# Patient Record
Sex: Male | Born: 1935 | Race: White | Hispanic: No | Marital: Married | Smoking: Former smoker
Health system: Southern US, Community
[De-identification: ages and names within clinical notes are randomized; demographics above are authoritative.]

## PROBLEM LIST (undated history)

## (undated) ENCOUNTER — Inpatient Hospital Stay (HOSPITAL_COMMUNITY): Admission: AD | Payer: Medicare Other | Source: Ambulatory Visit | Admitting: Internal Medicine

## (undated) DIAGNOSIS — I639 Cerebral infarction, unspecified: Secondary | ICD-10-CM

## (undated) DIAGNOSIS — I447 Left bundle-branch block, unspecified: Secondary | ICD-10-CM

## (undated) DIAGNOSIS — I509 Heart failure, unspecified: Secondary | ICD-10-CM

## (undated) DIAGNOSIS — M549 Dorsalgia, unspecified: Secondary | ICD-10-CM

## (undated) DIAGNOSIS — I6529 Occlusion and stenosis of unspecified carotid artery: Secondary | ICD-10-CM

## (undated) DIAGNOSIS — I2510 Atherosclerotic heart disease of native coronary artery without angina pectoris: Secondary | ICD-10-CM

## (undated) DIAGNOSIS — M353 Polymyalgia rheumatica: Secondary | ICD-10-CM

## (undated) DIAGNOSIS — N183 Chronic kidney disease, stage 3 unspecified: Secondary | ICD-10-CM

## (undated) DIAGNOSIS — E039 Hypothyroidism, unspecified: Secondary | ICD-10-CM

## (undated) DIAGNOSIS — E7800 Pure hypercholesterolemia, unspecified: Secondary | ICD-10-CM

## (undated) DIAGNOSIS — Z905 Acquired absence of kidney: Secondary | ICD-10-CM

## (undated) DIAGNOSIS — Z951 Presence of aortocoronary bypass graft: Secondary | ICD-10-CM

## (undated) DIAGNOSIS — I219 Acute myocardial infarction, unspecified: Secondary | ICD-10-CM

## (undated) DIAGNOSIS — G8929 Other chronic pain: Secondary | ICD-10-CM

## (undated) HISTORY — PX: KIDNEY SURGERY: SHX687

## (undated) HISTORY — DX: Cerebral infarction, unspecified: I63.9

## (undated) HISTORY — DX: Acute myocardial infarction, unspecified: I21.9

## (undated) HISTORY — DX: Heart failure, unspecified: I50.9

## (undated) HISTORY — PX: CHOLECYSTECTOMY: SHX55

## (undated) HISTORY — PX: NEPHRECTOMY: SHX65

## (undated) HISTORY — PX: CORONARY ARTERY BYPASS GRAFT: SHX141

---

## 1999-06-22 ENCOUNTER — Ambulatory Visit: Admission: RE | Admit: 1999-06-22 | Discharge: 1999-06-22 | Payer: Self-pay | Admitting: Internal Medicine

## 2000-02-06 ENCOUNTER — Encounter: Payer: Self-pay | Admitting: Urology

## 2000-02-10 ENCOUNTER — Encounter (INDEPENDENT_AMBULATORY_CARE_PROVIDER_SITE_OTHER): Payer: Self-pay

## 2000-02-10 ENCOUNTER — Encounter: Payer: Self-pay | Admitting: Urology

## 2000-02-10 ENCOUNTER — Inpatient Hospital Stay (HOSPITAL_COMMUNITY): Admission: RE | Admit: 2000-02-10 | Discharge: 2000-02-14 | Payer: Self-pay | Admitting: Urology

## 2001-04-12 ENCOUNTER — Encounter: Payer: Self-pay | Admitting: Urology

## 2001-04-12 ENCOUNTER — Encounter: Admission: RE | Admit: 2001-04-12 | Discharge: 2001-04-12 | Payer: Self-pay | Admitting: Urology

## 2001-07-13 ENCOUNTER — Encounter: Payer: Self-pay | Admitting: Internal Medicine

## 2001-07-13 ENCOUNTER — Encounter: Admission: RE | Admit: 2001-07-13 | Discharge: 2001-07-13 | Payer: Self-pay | Admitting: Internal Medicine

## 2002-02-04 ENCOUNTER — Encounter: Admission: RE | Admit: 2002-02-04 | Discharge: 2002-02-04 | Payer: Self-pay | Admitting: Urology

## 2002-02-04 ENCOUNTER — Encounter: Payer: Self-pay | Admitting: Urology

## 2002-02-14 ENCOUNTER — Encounter: Payer: Self-pay | Admitting: Urology

## 2002-02-14 ENCOUNTER — Ambulatory Visit (HOSPITAL_COMMUNITY): Admission: RE | Admit: 2002-02-14 | Discharge: 2002-02-14 | Payer: Self-pay | Admitting: Urology

## 2007-11-09 ENCOUNTER — Ambulatory Visit: Payer: Self-pay | Admitting: Vascular Surgery

## 2008-01-17 ENCOUNTER — Encounter: Admission: RE | Admit: 2008-01-17 | Discharge: 2008-01-17 | Payer: Self-pay | Admitting: Internal Medicine

## 2008-02-19 ENCOUNTER — Emergency Department (HOSPITAL_COMMUNITY): Admission: EM | Admit: 2008-02-19 | Discharge: 2008-02-19 | Payer: Self-pay | Admitting: Emergency Medicine

## 2008-10-16 ENCOUNTER — Ambulatory Visit: Payer: Self-pay | Admitting: Vascular Surgery

## 2009-05-15 ENCOUNTER — Ambulatory Visit: Payer: Self-pay | Admitting: Vascular Surgery

## 2009-06-24 ENCOUNTER — Inpatient Hospital Stay (HOSPITAL_COMMUNITY): Admission: EM | Admit: 2009-06-24 | Discharge: 2009-06-25 | Payer: Self-pay | Admitting: Emergency Medicine

## 2009-12-25 ENCOUNTER — Ambulatory Visit: Payer: Self-pay | Admitting: Vascular Surgery

## 2010-09-10 ENCOUNTER — Ambulatory Visit: Payer: Self-pay | Admitting: Vascular Surgery

## 2010-12-19 ENCOUNTER — Other Ambulatory Visit (INDEPENDENT_AMBULATORY_CARE_PROVIDER_SITE_OTHER): Payer: Medicare Other

## 2010-12-19 DIAGNOSIS — I6529 Occlusion and stenosis of unspecified carotid artery: Secondary | ICD-10-CM

## 2010-12-23 NOTE — Procedures (Unsigned)
CAROTID DUPLEX EXAM  INDICATION:  Follow up carotid artery disease.  HISTORY: Diabetes:  No. Cardiac:  CABG. Hypertension:  No. Smoking:  No. Previous Surgery:  No. CV History:  Currently asymptomatic. Amaurosis Fugax No, Paresthesias No, Hemiparesis No.                                      RIGHT             LEFT Brachial systolic pressure:         136               134 Brachial Doppler waveforms:         WNL               WNL Vertebral direction of flow:        Antegrade         Antegrade DUPLEX VELOCITIES (cm/sec) CCA peak systolic                   86                83 ECA peak systolic                   107               110 ICA peak systolic                   67                200 ICA end diastolic                   12                36 PLAQUE MORPHOLOGY:                  Heterogenous      Heterogenous PLAQUE AMOUNT:                      Mild              Moderate PLAQUE LOCATION:                    ICA, CCA          ICA, ECA, CCA  IMPRESSION: 1. Right side, no hemodynamically significant stenosis. 2. The left side has a 60% to 79% stenosis in the internal carotids,     the low end of range. 3. Study is stable compared to previous.  ___________________________________________ Quita Skye Hart [place], M.D.  OD/MEDQ  D:  12/19/2010  T:  12/19/2010  Job:  [id number]

## 2011-02-15 LAB — CBC
HCT: 39.2 % (ref 39.0–52.0)
MCHC: 33.9 g/dL (ref 30.0–36.0)
MCV: 100.2 fL — ABNORMAL HIGH (ref 78.0–100.0)
Platelets: 130 10*3/uL — ABNORMAL LOW (ref 150–400)
RDW: 13.3 % (ref 11.5–15.5)

## 2011-02-15 LAB — CARDIAC PANEL(CRET KIN+CKTOT+MB+TROPI)
CK, MB: 2.4 ng/mL (ref 0.3–4.0)
Relative Index: INVALID (ref 0.0–2.5)
Troponin I: 0.02 ng/mL (ref 0.00–0.06)
Troponin I: 0.03 ng/mL (ref 0.00–0.06)

## 2011-02-15 LAB — DIFFERENTIAL
Basophils Absolute: 0.1 10*3/uL (ref 0.0–0.1)
Eosinophils Absolute: 0.2 10*3/uL (ref 0.0–0.7)
Lymphs Abs: 1.1 10*3/uL (ref 0.7–4.0)
Monocytes Absolute: 0.6 10*3/uL (ref 0.1–1.0)

## 2011-02-15 LAB — POCT CARDIAC MARKERS: Troponin i, poc: <0.05 ng/mL (ref 0.00–0.09)

## 2011-02-15 LAB — APTT: aPTT: 24 seconds (ref 24–37)

## 2011-02-15 LAB — POCT I-STAT, CHEM 8
BUN: 35 mg/dL — ABNORMAL HIGH (ref 6–23)
Calcium, Ion: 1.13 mmol/L (ref 1.12–1.32)
TCO2: 26 mmol/L (ref 0–100)

## 2011-02-15 LAB — TROPONIN I: Troponin I: 0.03 ng/mL (ref 0.00–0.06)

## 2011-02-15 LAB — D-DIMER, QUANTITATIVE: D-Dimer, Quant: 0.48 ug/mL-FEU (ref 0.00–0.48)

## 2011-03-25 NOTE — Procedures (Signed)
CAROTID DUPLEX EXAM   INDICATION:  Carotid disease.   HISTORY:  Diabetes:  No.  Cardiac:  CABG.  Hypertension:  No.  Smoking:  No.  Previous Surgery:  No.  CV History:  Currently asymptomatic.  Amaurosis Fugax No, Paresthesias No, Hemiparesis No                                       RIGHT             LEFT  Brachial systolic pressure:         138               138  Brachial Doppler waveforms:         Normal            Normal  Vertebral direction of flow:        Antegrade         Antegrade  DUPLEX VELOCITIES (cm/sec)  CCA peak systolic                   85                68  ECA peak systolic                   89                140  ICA peak systolic                   71                215  ICA end diastolic                   15                52  PLAQUE MORPHOLOGY:                  Mixed             Mixed  PLAQUE AMOUNT:                      Mild              Moderate  PLAQUE LOCATION:                    ICA / CCA         ICA / ECA / CCA   IMPRESSION:  1. 1%-39% stenosis of the right internal carotid artery.  2. Low end 60%-79% stenosis of the left internal carotid artery.  3. No significant change noted when compared to the previous exam on      05/15/2009.   ___________________________________________  Quita Skye. Hart [place], M.D.   CH/MEDQ  D:  12/25/2009  T:  12/26/2009  Job:  [id number]

## 2011-03-25 NOTE — Procedures (Signed)
CAROTID DUPLEX EXAM   INDICATION:  Followup known carotid artery disease.   HISTORY:  Diabetes:  No.  Cardiac:  Yes, CABG.  Hypertension:  No.  Smoking:  No.  Previous Surgery:  Left kidney nephrectomy.  CV History:  Amaurosis Fugax No, Paresthesias No, Hemiparesis No                                       RIGHT             LEFT  Brachial systolic pressure:         130               120  Brachial Doppler waveforms:         Biphasic          Biphasic  Vertebral direction of flow:        Antegrade         Antegrade  DUPLEX VELOCITIES (cm/sec)  CCA peak systolic                   77                83  ECA peak systolic                   101               99  ICA peak systolic                   67                176  ICA end diastolic                   12                33  PLAQUE MORPHOLOGY:                  Heterogenous      Heterogeneous  PLAQUE AMOUNT:                      Mild              Moderate  PLAQUE LOCATION:                    ICA               ICA, ECA   IMPRESSION:  1. 40-59% stenosis noted in the left internal carotid artery.  2. 1-39% stenosis noted in the right internal carotid artery.  3. Antegrade bilateral vertebral arteries.   ___________________________________________  Quita Skye Hart [place], M.D.   MG/MEDQ  D:  11/09/2007  T:  11/09/2007  Job:  [id number]

## 2011-03-25 NOTE — H&P (Signed)
NAME:  Barry Wright, Barry Wright NO.:  [id number]   MEDICAL RECORD NO.:  [id number]          PATIENT TYPE:  EMS   LOCATION:  MAJO                         FACILITY:  [facility]   PHYSICIAN:  Jake Bathe, MD      DATE OF BIRTH:  1920-07-12   DATE OF ADMISSION:  06/24/2009  DATE OF DISCHARGE:                              HISTORY & PHYSICAL   PRIMARY CARE PHYSICIAN:  Theressa Millard, MD   CARDIOLOGIST:  Lyn Records, MD   CHIEF COMPLAINT:  Shoulder pain/chest pain.   HISTORY OF PRESENT ILLNESS:  An 77 year old male whose birthday is today  with a history of CAD status post bypass surgery in 1996 who earlier  today at church had two separate episodes of pain behind his left  scapula with associated diaphoresis.  These two episodes lasted  approximately 5 minutes in duration and subsided.  Because of the  diaphoresis and his prior cardiac history, he was concerned and called  911 who then took him to the [facility] Emergency Department for further  evaluation.  He clearly denies currently any substernal chest  discomfort.  He denied any shortness of breath also.  Unfortunately, it  is his birthday today and he was going to go to dinner but obviously his  plans have been cancelled.  Currently, he is feeling well except for  chronic back pain.  He has been off his aspirin for the past several  days in anticipation of injection for back pain.   PAST MEDICAL HISTORY:  1. CAD status post bypass in 1996 x5 he believes.  I do not have the      prior records available.  2. Polymyalgia rheumatica - classic diagnosis of weakness on trying to      get up from a seated position.  On chronic prednisone.  3. 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  4. Peripheral vascular disease/carotid artery disease with stable left      internal carotid artery plaque, recently measured of 60-79%.  No      change.  5. Chronic back pain.   ALLERGIES:  PENICILLIN.   MEDICATIONS AT HOME:  1.  Vytorin 10/20 mg once a day.  2. Aspirin 81 mg once a day.  3. Synthroid 0.05 mg once a day.  4. Vicodin p.r.n.  5. Prednisone 4 mg once a day.   He is no longer on Actonel.   FAMILY HISTORY:  His father had myocardial infarction in his late 29s.   SOCIAL HISTORY:  Denies any tobacco, alcohol, or drug use.  He is  married.  Both he and his wife are patients of Dr. Katrinka Blazing.  His wife had  bypass surgery by Dr. Laneta Simmers.   REVIEW OF SYSTEMS:  Other than chronic back pain, he denies any syncope,  bleeding, orthopnea, or chest pain.  Unless specified above, all other  12 review of systems are negative.   PHYSICAL EXAMINATION:  VITAL SIGNS:  Heart rate currently in the 60s  with left bundle-branch block pattern which is chronic, respirations 20,  saturating 96% on room air, blood  pressure 152/63 to 145/60, and  temperature 99.4.  GENERAL:  Alert and oriented x3 in no acute distress, pleasant, lying  comfortably in bed with his wife at bedside.  EYES:  Well-perfused conjunctivae.  EOMI.  No scleral icterus.  NECK:  Supple.  No lymphadenopathy.  I would do not appreciate his left  carotid bruit, although the environment is quite noisy currently.  No  JVD appreciated.  Supple.  CARDIOVASCULAR:  Regular rate and rhythm without any appreciable  murmurs, rubs, or gallops.  Normal PMI.  LUNGS:  Clear to auscultation bilaterally.  Normal respiratory effort.  ABDOMEN:  Soft and nontender.  Normoactive bowel sounds.  No rebound, no  guarding, and no bruits.  EXTREMITIES:  No clubbing, cyanosis, or edema.  Normal distal pulses.  NEUROLOGIC:  Nonfocal.  Moves all extremities x4.  No tremors.  SKIN:  Warm, dry, and intact.  No rashes.  PSYCH:  Normal affect.   LABORATORY DATA:  EKG personally viewed shows sinus rhythm with left  bundle-branch block morphology, heart rate is 57, and currently heart  rate is 68.  Prior ECG from 2001 showed sinus bradycardia rate of 55  with no change.  Sodium 138,  potassium 4.6, creatinine 1.6, and glucose  114.  White count is 8.6, hemoglobin 13.6, platelets 130, and MCV is  100.2, slightly elevated.  Point-of-cardiac biomarkers are negative x1.  D-dimer is 0.48.  Carotid Doppler from July 2010 shows right internal  carotid artery of 1-39%, left internal carotid artery 60-79%.   ASSESSMENT AND PLAN:  An 77 year old male with coronary disease status  post bypass with polymyalgia rheumatica and hyperlipidemia here for  evaluation of left shoulder pain and diaphoresis concerning for possible  acute coronary artery syndrome.  1. Possible acute coronary artery syndrome - pain is quite atypical.      However, given his prior cardiac risk factors, he was anxious and      worried.  I will go ahead and place him in a telemetry bed      overnight and cycle cardiac biomarkers.  For now, I will place him      on heparin subcu prophylaxis for deep venous thrombosis and      increased only as cardiac biomarkers are abnormal.  His EKG shows      left bundle-branch block and there is no change.  Currently, his      heart rate is in the 60s and I do believe that he would be able to      tolerate a very low-dose beta-blocker metoprolol 12.5 mg twice a      day.  Of course, we will hold this if heart rate is less than 55.      I will restart aspirin therapy which he has been holding for back      injection.  We will continue his Vytorin as substituted by      Hospital.  Hopefully, if cardiac biomarkers are reassuring and he      continues to be without discomfort, he will be able to be      discharged tomorrow.  2. Hyperlipidemia - continue Vytorin.  3. Polymyalgia rheumatica - continue prednisone 4 mg once a day.  I      will also place him on Protonix.  No evidence of addisonian      disease.  4. Mildly elevated temperature - temperature was 99.4 on admission.      His white count is normal.  I will  monitor.      Certainly, if he had a mild viral syndrome, this  could lead to his      diaphoresis or overall poor feeling.  5. High MCV - 100 - can be investigated as an outpatient.  Maybe B12      or folate deficient.  6. Chronic kidney disease - creatinine is 1.6 with BUN of 35 - I will      lightly hydrate him.      Jake Bathe, MD  Electronically Signed     MCS/MEDQ  D:  06/24/2009  T:  06/24/2009  Job:  [id number]   cc:   Theressa Millard, M.D.  Lyn Records, M.D.

## 2011-03-25 NOTE — Consult Note (Signed)
NEW PATIENT CONSULTATION   Barry Wright, Barry Wright  DOB:  Mar 30, 1920                                       09/10/2010  EAVWU#:[id number]   This is an 77 year old patient referred by Dr. Earl Gala for bilateral  lower extremity swelling.  He states that over the last few months, his  lower calves and ankles have become more swollen, left worse than right.  He has no history of DVT, thrombophlebitis, stasis ulcers, bleeding or  varicose veins.  He did have saphenous veins removed from both legs with  coronary artery bypass grafting in 1998.  He has had no edema.  He does  not wear elastic compression stockings nor elevate his legs on a regular  basis.  He denies any pain, history of bleeding or ulceration.   CHRONIC MEDICAL PROBLEMS:  1. Coronary artery disease, previous coronary bypass grafting.  2. Status post nephrectomy.  3. History of congestive heart failure in the past.  4. Hyperlipidemia.  5. Chronic renal disease, stage 3.  6. Sleep apnea.  7. Hyperthyroidism.  8. Cerebrovascular disease with moderate carotid occlusion,      asymptomatic.  9. History of malignant melanoma.   SOCIAL HISTORY:  He is married and has not smoked in 40 years, 20 pack  year history.  Occasional alcohol, is retired.   FAMILY HISTORY:  Negative for coronary artery disease, diabetes and  stroke.   REVIEW OF SYSTEMS:  Denies any chest pain, dyspnea on exertion.  Does  have some mild claudication symptoms of the letter no chronic illnesses  hemoptysis or pneumonia.  All other systems are negative in review of  systems.   PHYSICAL EXAMINATION:  Blood pressure 132/67, heart rate 55, respiration  24.  General:  He is a well-developed, well-nourished male in no  apparent distress, alert and oriented x3.  HEENT:  Exam is normal.  EOMs  intact.  Lungs:  Clear to auscultation.  No rhonchi or wheezing.  Cardiovascular:  Regular rhythm. No murmurs.  Carotid pulses 3+, no  bruits.  Abdomen:   Soft, nontender with no masses.  Musculoskeletal:  Free of major deformities.  Neurologic:  Normal.  Lower extremities:  3+  femoral and posterior tibial pulses bilaterally.  Skin:  Free of rashes.  There is no hyperpigmentation, ulceration or bulging varicosities of the  lower extremities.  He has some 1+ edema at the most at both ankles  today with no reticular or spider veins.   Today I ordered venous duplex exam which I reviewed and interpreted.  Deep system has some mild reflux and no evidence of DVT.  There is some  reflux in both great saphenous veins from the saphenofemoral junction to  the mid thigh on the left and to the knee on the right.   He does have reflux of the saphenous veins but I do not think it is  causing his swelling symptoms currently.  He states that it has been  relieved by his new administration of Lasix three times per week.  If  the swelling worsens or if he develops varicosities or any changes then  certainly he could be treated with laser ablation of both saphenous  veins to improve the situation but I do not think it is indicated at the  present time.     Quita Skye Hart [place], M.D.  Electronically Signed  JDL/MEDQ  D:  09/10/2010  T:  09/11/2010  Job:  [id number]   cc:   Theressa Millard, M.D.

## 2011-03-25 NOTE — Discharge Summary (Signed)
NAME:  Barry Wright, Barry Wright NO.:  [id number]   MEDICAL RECORD NO.:  [id number]          PATIENT TYPE:  INP   LOCATION:  3713                         FACILITY:  [facility]   PHYSICIAN:  Lyn Records, M.D.   DATE OF BIRTH:  04/10/1920   DATE OF ADMISSION:  06/24/2009  DATE OF DISCHARGE:  06/25/2009                               DISCHARGE SUMMARY   REASON FOR ADMISSION:  Chest and left scapular discomfort.   DISCHARGE DIAGNOSES:  1. Left scapular discomfort, myocardial infarction ruled out.  2. Coronary atherosclerotic heart disease.      a.     Coronary artery bypass grafting in 1996.      b.     Negative Cardiolite for ischemia within the past 2 years.  3. Polymyalgia rheumatica.  4. Left bundle-branch block with sinus bradycardia.  5. Hypothyroidism.   CONDITION ON DISCHARGE:  Improved.   FOLLOWUP INSTRUCTIONS:  Dr. Verdis Prime, July 04, 2009 at 11 a.m. with  a TSH level.   DISCHARGE MEDICATIONS:  1. Synthroid 0.05 mg daily.  2. Vytorin 10/20 mg daily.  3. Os-Cal 500 mg daily.  4. Actonel 35 mg weekly.  5. Prednisone 4 mg daily.  6. Vicodin 5/500 mg as needed for pain associated with polymyalgia.  7. Nitroglycerin 0.4 mg sublingually p.r.n.   ACTIVITY:  Unrestricted.   DIET:  Low-salt heart healthy diet.   CODE STATUS:  Full.   COMMENTS:  The patient was admitted to the hospital on June 24, 2009  after developing two episodes of left scapular discomfort, each lasting  less than 10 minutes.  There was no exertional component.  He did not  use nitroglycerin.  By the time EMS arrived at his church, the patient  felt fine.  He was admitted to the hospital and multiple cardiac markers  were negative for ischemia.  It was noted on telemetry that sinus  bradycardia with resting heart rates in the low 40s was noted.  Average  heart rate was above 50 beats per minute.   Laboratory data revealed a BUN and creatinine that were slightly  elevated at 35 and  1.6.   The patient was discharged home in improved condition.  Ischemic  evaluation will be considered if recurrent episodes of shoulder or chest  discomfort occurred.  We will check an outpatient TSH to rule out  hypothyroidism as a cause of the patient's persistent bradycardia.  I  did discuss with him the potential future need for pacemaker therapy  should he become symptomatic due to bradycardia.      Lyn Records, M.D.  Electronically Signed     Lyn Records, M.D.  Electronically Signed    HWS/MEDQ  D:  06/25/2009  T:  06/25/2009  Job:  [id number]   cc:   Theressa Millard, M.D.

## 2011-03-25 NOTE — Procedures (Signed)
CAROTID DUPLEX EXAM   INDICATION:  Follow up carotid artery disease.   HISTORY:  Diabetes:  No.  Cardiac:  CABG.  Hypertension:  No.  Smoking:  No.  Previous Surgery:  History of left nephrectomy.  CV History:  Asymptomatic.  Amaurosis Fugax No, Paresthesias No, Hemiparesis No.                                       RIGHT             LEFT  Brachial systolic pressure:         132               124  Brachial Doppler waveforms:         Normal            Normal  Vertebral direction of flow:        Antegrade         Antegrade  DUPLEX VELOCITIES (cm/sec)  CCA peak systolic                   82                70  ECA peak systolic                   99                108  ICA peak systolic                   73                220  ICA end diastolic                   17                35  PLAQUE MORPHOLOGY:                  Mixed             Mixed  PLAQUE AMOUNT:                      Mild              Moderate  PLAQUE LOCATION:                    ICA/CCA           ICA/ECA   IMPRESSION:  1. 1-39% stenosis of the right internal carotid artery.  2. Low-end 60-79% stenosis of the left internal carotid artery.  3. Increased velocities are noted in the left internal carotid artery      when compared to the previous examination on 11/09/2007; however,      these velocities do correlate with the examination that was      performed on 09/15/2006.   ___________________________________________  Quita Skye. Hart [place], M.D.   CH/MEDQ  D:  10/16/2008  T:  10/16/2008  Job:  [id number]

## 2011-03-25 NOTE — Procedures (Signed)
LOWER EXTREMITY VENOUS REFLUX EXAM   INDICATION:  Bilateral lower extremity edema.   EXAM:  Using color-flow imaging and pulse Doppler spectral analysis, the  bilateral common femoral, superficial femoral, popliteal, posterior  tibial, greater and lesser saphenous veins are evaluated.  There is  evidence suggesting deep venous reflux of >500 milliseconds noted in the  bilateral lower extremities.   The bilateral saphenofemoral junction and the nontortuous GSV's are not  competent with reflux of >567milliseconds with the caliber as described  below.   The bilateral proximal short saphenous veins demonstrate reflux of >500  milliseconds with partially occlusive chronic post-thrombotic scarring  noted.   GSV Diameter (used if found to be incompetent only)                                            Right    Left  Proximal Greater Saphenous Vein           0.52 cm  0.39 cm  Proximal-to-mid-thigh                     cm       cm  Mid thigh                                 0.46 cm  0.30 cm  Mid-distal thigh                          cm       cm  Distal thigh                              0.46 cm  0.40 cm  Knee                                      0.31 cm  0.40 cm   IMPRESSION:  1. Bilateral greater saphenous vein incompetence noted, as described      above.  2. Deep venous system incompetence is noted, as described above.  3. Bilateral short saphenous vein reflux is noted, as described above.   ___________________________________________  Quita Skye. Hart [place], M.D.   CH/MEDQ  D:  09/11/2010  T:  09/11/2010  Job:  [id number]

## 2011-03-25 NOTE — Procedures (Signed)
CAROTID DUPLEX EXAM   INDICATION:  Carotid artery disease with tingling in left side of neck  for approximately 4-5 days.   HISTORY:  Diabetes:  No  Cardiac:  CABG  Hypertension:  No  Smoking:  No  Previous Surgery:  History of left nephrectomy  CV History:  No  Amaurosis Fugax No, Paresthesias No, Hemiparesis No                                        RIGHT             LEFT  Brachial systolic pressure:         154               150  Brachial Doppler waveforms:         WNL               WNL  Vertebral direction of flow:        Antegrade         Antegrade  DUPLEX VELOCITIES (cm/sec)  CCA peak systolic                   91                96  ECA peak systolic                   114               140  ICA peak systolic                   94                218  ICA end diastolic                   20                50  PLAQUE MORPHOLOGY:                  Mixed             Mixed  PLAQUE AMOUNT:                      Mild              Moderate  PLAQUE LOCATION:                    ICA/CCA           ICA/ECA/CCA   IMPRESSION:  Right ICA shows evidence of 1-39% stenosis.  Left ICA shows evidence of 60-79% stenosis (low end of range).  No significant changes from previous study.       ___________________________________________  Quita Skye Hart [place], M.D.   AS/MEDQ  D:  05/15/2009  T:  05/15/2009  Job:  [id number]   cc:   Quita Skye. Hart [place], M.D.

## 2011-06-24 ENCOUNTER — Other Ambulatory Visit: Payer: Medicare Other

## 2011-07-03 ENCOUNTER — Other Ambulatory Visit: Payer: Medicare Other

## 2011-07-10 ENCOUNTER — Other Ambulatory Visit: Payer: Medicare Other

## 2011-07-29 ENCOUNTER — Ambulatory Visit: Payer: Medicare Other

## 2011-08-01 ENCOUNTER — Other Ambulatory Visit: Payer: Medicare Other

## 2011-08-04 ENCOUNTER — Other Ambulatory Visit: Payer: Self-pay | Admitting: Internal Medicine

## 2011-08-04 DIAGNOSIS — M4800 Spinal stenosis, site unspecified: Secondary | ICD-10-CM

## 2011-08-05 LAB — URINALYSIS, ROUTINE W REFLEX MICROSCOPIC
Nitrite: NEGATIVE
Specific Gravity, Urine: 1.026
pH: 5.5

## 2011-08-11 ENCOUNTER — Ambulatory Visit
Admission: RE | Admit: 2011-08-11 | Discharge: 2011-08-11 | Disposition: A | Discharge status: Home / Self Care | Payer: Medicare Other | Source: Ambulatory Visit | Attending: Internal Medicine | Admitting: Internal Medicine

## 2011-08-11 DIAGNOSIS — M4800 Spinal stenosis, site unspecified: Secondary | ICD-10-CM

## 2011-08-21 ENCOUNTER — Other Ambulatory Visit: Payer: Medicare Other

## 2011-09-01 ENCOUNTER — Other Ambulatory Visit: Payer: Self-pay | Admitting: Neurological Surgery

## 2011-09-01 DIAGNOSIS — G911 Obstructive hydrocephalus: Secondary | ICD-10-CM

## 2011-09-08 ENCOUNTER — Ambulatory Visit
Admission: RE | Admit: 2011-09-08 | Discharge: 2011-09-08 | Disposition: A | Discharge status: Home / Self Care | Payer: Medicare Other | Source: Ambulatory Visit | Attending: Neurological Surgery | Admitting: Neurological Surgery

## 2011-09-08 DIAGNOSIS — G911 Obstructive hydrocephalus: Secondary | ICD-10-CM

## 2011-09-09 ENCOUNTER — Emergency Department (HOSPITAL_COMMUNITY): Payer: Medicare Other

## 2011-09-09 ENCOUNTER — Observation Stay (HOSPITAL_COMMUNITY)
Admission: EM | Admit: 2011-09-09 | Discharge: 2011-09-13 | Disposition: A | Discharge status: Home Health | Payer: Medicare Other | Attending: Internal Medicine | Admitting: Internal Medicine

## 2011-09-09 DIAGNOSIS — M353 Polymyalgia rheumatica: Secondary | ICD-10-CM | POA: Insufficient documentation

## 2011-09-09 DIAGNOSIS — N183 Chronic kidney disease, stage 3 unspecified: Secondary | ICD-10-CM | POA: Insufficient documentation

## 2011-09-09 DIAGNOSIS — R4182 Altered mental status, unspecified: Secondary | ICD-10-CM | POA: Insufficient documentation

## 2011-09-09 DIAGNOSIS — F29 Unspecified psychosis not due to a substance or known physiological condition: Secondary | ICD-10-CM | POA: Insufficient documentation

## 2011-09-09 DIAGNOSIS — Z7901 Long term (current) use of anticoagulants: Secondary | ICD-10-CM | POA: Insufficient documentation

## 2011-09-09 DIAGNOSIS — I6529 Occlusion and stenosis of unspecified carotid artery: Secondary | ICD-10-CM | POA: Insufficient documentation

## 2011-09-09 DIAGNOSIS — M4800 Spinal stenosis, site unspecified: Principal | ICD-10-CM | POA: Insufficient documentation

## 2011-09-09 DIAGNOSIS — G8929 Other chronic pain: Secondary | ICD-10-CM | POA: Insufficient documentation

## 2011-09-09 DIAGNOSIS — I2510 Atherosclerotic heart disease of native coronary artery without angina pectoris: Secondary | ICD-10-CM | POA: Insufficient documentation

## 2011-09-09 DIAGNOSIS — I129 Hypertensive chronic kidney disease with stage 1 through stage 4 chronic kidney disease, or unspecified chronic kidney disease: Secondary | ICD-10-CM | POA: Insufficient documentation

## 2011-09-09 DIAGNOSIS — Z905 Acquired absence of kidney: Secondary | ICD-10-CM | POA: Insufficient documentation

## 2011-09-09 DIAGNOSIS — I447 Left bundle-branch block, unspecified: Secondary | ICD-10-CM | POA: Insufficient documentation

## 2011-09-09 DIAGNOSIS — I498 Other specified cardiac arrhythmias: Secondary | ICD-10-CM | POA: Insufficient documentation

## 2011-09-09 HISTORY — DX: Occlusion and stenosis of unspecified carotid artery: I65.29

## 2011-09-09 HISTORY — DX: Other chronic pain: G89.29

## 2011-09-09 HISTORY — DX: Atherosclerotic heart disease of native coronary artery without angina pectoris: I25.10

## 2011-09-09 HISTORY — DX: Presence of aortocoronary bypass graft: Z95.1

## 2011-09-09 HISTORY — DX: Pure hypercholesterolemia, unspecified: E78.00

## 2011-09-09 HISTORY — DX: Dorsalgia, unspecified: M54.9

## 2011-09-09 HISTORY — DX: Hypothyroidism, unspecified: E03.9

## 2011-09-09 HISTORY — DX: Acquired absence of kidney: Z90.5

## 2011-09-09 HISTORY — DX: Polymyalgia rheumatica: M35.3

## 2011-09-09 HISTORY — DX: Left bundle-branch block, unspecified: I44.7

## 2011-09-09 LAB — COMPREHENSIVE METABOLIC PANEL
AST: 16 U/L (ref 0–37)
Albumin: 3 g/dL — ABNORMAL LOW (ref 3.5–5.2)
Alkaline Phosphatase: 51 U/L (ref 39–117)
Chloride: 102 mEq/L (ref 96–112)
Creatinine, Ser: 1.59 mg/dL — ABNORMAL HIGH (ref 0.50–1.35)
Potassium: 3.2 mEq/L — ABNORMAL LOW (ref 3.5–5.1)
Total Bilirubin: 0.5 mg/dL (ref 0.3–1.2)
Total Protein: 5.4 g/dL — ABNORMAL LOW (ref 6.0–8.3)

## 2011-09-09 LAB — CK TOTAL AND CKMB (NOT AT ARMC)
CK, MB: 2.9 ng/mL (ref 0.3–4.0)
Relative Index: 2.3 (ref 0.0–2.5)
Total CK: 126 U/L (ref 7–232)

## 2011-09-09 LAB — URINALYSIS, ROUTINE W REFLEX MICROSCOPIC
Leukocytes, UA: NEGATIVE
Nitrite: NEGATIVE
Specific Gravity, Urine: 1.017 (ref 1.005–1.030)
Urobilinogen, UA: 1 mg/dL (ref 0.0–1.0)

## 2011-09-09 LAB — DIFFERENTIAL
Basophils Absolute: 0 10*3/uL (ref 0.0–0.1)
Basophils Relative: 0 % (ref 0–1)
Neutro Abs: 5.4 10*3/uL (ref 1.7–7.7)
Neutrophils Relative %: 71 % (ref 43–77)

## 2011-09-09 LAB — CBC
Hemoglobin: 12.1 g/dL — ABNORMAL LOW (ref 13.0–17.0)
RBC: 3.68 MIL/uL — ABNORMAL LOW (ref 4.22–5.81)

## 2011-09-09 LAB — TSH: TSH: 1.014 u[IU]/mL (ref 0.350–4.500)

## 2011-09-09 LAB — SEDIMENTATION RATE: Sed Rate: 13 mm/hr (ref 0–16)

## 2011-09-09 LAB — POCT I-STAT TROPONIN I: Troponin i, poc: 0.05 ng/mL (ref 0.00–0.08)

## 2011-09-09 LAB — VITAMIN B12: Vitamin B-12: 362 pg/mL (ref 211–911)

## 2011-09-09 NOTE — H&P (Signed)
NAME:  Barry Wright, ALLUMS NO.:  [id number]  MEDICAL RECORD NO.:  [id number]  LOCATION:  [facility]                         FACILITY:  [facility]  PHYSICIAN:  Tarry Kos, MD       DATE OF BIRTH:  Feb 16, 1920  DATE OF ADMISSION:  09/09/2011 DATE OF DISCHARGE:                             HISTORY & PHYSICAL   CHIEF COMPLAINT:  Worsening confusion.  HPI:  Barry Wright is an 77 year old male who is highly functioning at home, who have his wife and daughter with him right now, who state that over the last month he has had waxing and waning confusion that has been getting more progressively worse and has significantly worsened over the last several days.  He has a history of lumbar stenosis and saw neurosurgeon within the last week at which point was sent to get a MRI of his brain to rule out hydrocephalus and significant stenosis in his neck.  Because he has been recently falling and having some gait abnormalities, so he had MRI of his brain yesterday and he had a C-spine which did show some significant foraminal narrowing at C3-C4.  Other levels with no evidence of neural compression.  His MRI of his brain yesterday did not show anything to suggest hydrocephalus.  They brought him in this morning because his confusion is much worse.  He has not had much to eat or drink in the last several days.  His wife said he has not been complaining of any chest pain.  He has not been having any belly pain, however, he does have some GI upset from the chronic narcotics that he is on.  He takes hydrocodone around the clock every 4 hours, and they do say that he does tend to get sleepy in correlation with his pain medications.  He has not been having any nausea, vomiting, diarrhea.  He has not been having any dysuria.  He has not had any other focal neurological symptoms, except for the mental status changes.  He has not had any seizure activity.  REVIEW OF SYSTEMS:  Otherwise negative.  PAST  MEDICAL HISTORY: 1. Chronic back pain that is poorly controlled, with lumbar stenosis. 2. Hypercholesteremia. 3. Hypothyroidism. 4. History of coronary artery disease. 5. Chronic renal insufficiency. 6. Polymyalgia rheumatica. 7. History of left bundle branch block. 8. Hypothyroidism. 9. Status post bypass surgery in 1996. 10.Nephrectomy in 1999. 11.History of internal carotid artery stenosis 60-79%.  ALLERGIES:  PENICILLIN causes a rash.  HOME MEDICATIONS:  Aspirin, doxazosin, hydrocodone, prednisone, Synthroid, Vytorin.  SOCIAL HISTORY:  He is a nonsmoker.  No alcohol.  No IV drug abuse.  He normally walks without difficulty.  His mental status is normally normal.  Highly functioning, still driving.  Lives with his wife.  PHYSICAL EXAMINATION:  VITAL SIGNS:  Temperature is 99.2, blood pressure 156/59, pulse 67 with a low of 50, respiration 20, and 95% O2 sats on room air. GENERAL:  He is alert.  He clinically looks very dehydrated with very dry mucous membranes.  He is oriented to person and place, but not to time.  He appears slightly sedated in no apparent respiratory distress. HEENT:  Extraocular muscles are  intact.  Pupils equal, reactive to light.  Oropharynx clear.  Mucous membranes dry. NECK:  No JVD.  No carotid bruits. CARDIAC:  Regular rate and rhythm.  No murmurs, rubs, or gallops. CHEST:  Clear to auscultation bilaterally.  No wheezes or rales. ABDOMEN:  Soft, nontender, nondistended.  Positive bowel sounds.  No hepatosplenomegaly. EXTREMITIES:  No clubbing, cyanosis, or edema. PSYCH: Normal mood and affect. NEURO:  Cranial nerves II through XII grossly intact.  No focal neurologic deficits.  5/5 strength in upper and lower extremities bilaterally. SKIN:  No rashes.  LABORATORY DATA:  His potassium is low at 3.2.  Sodium is normal.  BUN and creatinine 29 and 1.59.  LFTs normal.  Troponin negative.  His white count normal, hemoglobin 12.1.  Urinalysis is  normal.  12-lead EKG, right bundle-branch block.  CT of his head, no acute intracranial abnormalities.  ASSESSMENT/PLAN:  This is an 77 year old male with altered mental status of unclear etiology. Altered mental status.  Neurology has been called for consultation.  It is really unclear he had a MRI done yesterday.  I am going to check a 2D echo and leave further studies up to Neurology.  Obtained neurological checks every 4 hours.  I am also going to check orthostatics.  I am also going to check a B12, folate, RPR, random cortisol level and a sed rate, and serial his cardiac enzymes and obtain a 2D echo.  I am going to also check a chest x-ray to make sure he does not have underlying pneumonia. He certainly appears clinically dehydrated, however, the differential diagnosis is broad since this has been going on for a month, I suspect that his short-acting pain medications might be contributing to this, might want to consider adding a low-dose fentanyl patch in the future once his mental status clears in order to minimize the amount of hydrocodone that he is having to take during the day so that he will get better pain relief.  We will also check thyroid studies.  We are going to place him on IV fluids overnight.  Await further neurological workup by the Neurology Service.  He currently has no focal neurologic deficits except for confusion.  Again, we will check a chest x-ray to make sure he does not have pneumonia and start antibiotics if needed.  Further recommendations depending on above workup.          ______________________________ Tarry Kos, MD     RD/MEDQ  D:  09/09/2011  T:  09/09/2011  Job:  [id number]  Electronically Signed by Tarry Kos MD on 09/09/2011 08:23:35 PM

## 2011-09-10 DIAGNOSIS — R4789 Other speech disturbances: Secondary | ICD-10-CM

## 2011-09-10 DIAGNOSIS — R0989 Other specified symptoms and signs involving the circulatory and respiratory systems: Secondary | ICD-10-CM

## 2011-09-10 LAB — CBC
Hemoglobin: 12 g/dL — ABNORMAL LOW (ref 13.0–17.0)
MCH: 32.4 pg (ref 26.0–34.0)
Platelets: 113 10*3/uL — ABNORMAL LOW (ref 150–400)
RBC: 3.7 MIL/uL — ABNORMAL LOW (ref 4.22–5.81)

## 2011-09-10 LAB — BASIC METABOLIC PANEL
CO2: 27 mEq/L (ref 19–32)
Calcium: 9 mg/dL (ref 8.4–10.5)
GFR calc non Af Amer: 40 mL/min — ABNORMAL LOW (ref >90)
Glucose, Bld: 108 mg/dL — ABNORMAL HIGH (ref 70–99)
Potassium: 3.6 mEq/L (ref 3.5–5.1)
Sodium: 141 mEq/L (ref 135–145)

## 2011-09-10 LAB — URINE CULTURE: Culture: NO GROWTH

## 2011-09-10 LAB — CARDIAC PANEL(CRET KIN+CKTOT+MB+TROPI)
CK, MB: 3 ng/mL (ref 0.3–4.0)
Relative Index: 2.4 (ref 0.0–2.5)
Total CK: 107 U/L (ref 7–232)
Troponin I: <0.3 ng/mL (ref <0.30)
Troponin I: <0.3 ng/mL (ref <0.30)

## 2011-09-10 LAB — RPR: RPR Ser Ql: NONREACTIVE

## 2011-09-10 LAB — T4, FREE: Free T4: 1.03 ng/dL (ref 0.80–1.80)

## 2011-09-11 ENCOUNTER — Observation Stay (HOSPITAL_COMMUNITY): Payer: Medicare Other

## 2011-09-11 ENCOUNTER — Encounter (HOSPITAL_COMMUNITY): Payer: Self-pay | Admitting: Radiology

## 2011-09-11 DIAGNOSIS — I6529 Occlusion and stenosis of unspecified carotid artery: Secondary | ICD-10-CM

## 2011-09-11 LAB — LIPID PANEL
Cholesterol: 160 mg/dL (ref 0–200)
Triglycerides: 60 mg/dL (ref <150)

## 2011-09-11 MED ORDER — IOHEXOL 350 MG/ML SOLN
50.0000 mL | Freq: Once | INTRAVENOUS | Status: AC | PRN
  Administered 2011-09-11: 50 mL via INTRAVENOUS

## 2011-09-15 NOTE — Consult Note (Signed)
NAME:  Barry Wright, Barry Wright               ACCOUNT NO.:  [id number]  MEDICAL RECORD NO.:  [id number]  LOCATION:                                 FACILITY:  PHYSICIAN:  Juleen China IV, MDDATE OF BIRTH:  Oct 06, 1920  DATE OF CONSULTATION:  09/11/2011 DATE OF DISCHARGE:                                CONSULTATION   REASON FOR CONSULT:  Carotid stenosis.  Consulting service is the Internal Medicine Team.  HISTORY:  This is an 77 year old gentleman who has been followed in our office for many years with stable left-sided carotid stenosis in the 60- 79% range.  Over the past month, he has been having worsening difficulty with his memory, specifically more prominent over the last few days.  On September 09, 2011, he was on the phone with his daughter and was noted to have definitive expressive difficulties with his speech.  This lasted approximately 10-15 minutes and then resolved.  The wife states that he has also had some issues with word finding.  He has also had problems with swallowing and has undergone evaluation for lumbar stenosis.  An MRI was ordered to rule out hydrocephalus.  He was found to have significant foraminal narrowing at the C3-C4 level, the MRI of the brain was negative.  Currently, he is back to his baseline.  The patient has a history of hypercholesterolemia which is managed medically.  He is status post coronary artery bypass grafting in 1996. He has chronic renal insufficiency.  Dr. Verdis Prime is his cardiologist who follows him.  He has 1 functioning kidney and is status post nephrectomy.  He denies chest pain or shortness of breath.  REVIEW OF SYMPTOMS:  Negative for chest pain, shortness of breath, nausea vomiting, diarrhea, or dysuria.  No focal neurologic symptoms.  PAST MEDICAL HISTORY: 1. Chronic back pain. 2. Hypercholesterolemia. 3. Hypothyroidism. 4. Coronary artery disease. 5. Chronic renal insufficiency. 6. Polymyalgia rheumatica. 7. Left  bundle-branch block. 8. Hypothyroidism. 9. Coronary artery bypass graft. 10.Nephrectomy.  ALLERGIES:  PENICILLIN.  MEDICATIONS:  Aspirin, doxazosin, hydrocodone, prednisone, Synthroid, Vytorin.  SOCIAL HISTORY:  Negative for tobacco and negative for alcohol.  He is highly functioning and lives with his wife.  He is still driving.  FAMILY HISTORY:  Noncontributory.  PHYSICAL EXAMINATION:  VITAL SIGNS:  He is afebrile, hemodynamically stable. GENERAL:  He is well appearing, in no acute distress. HEENT:  Within normal limits. CARDIOVASCULAR:  Regular rate and rhythm.  PULMONARY:  Respirations are nonlabored.  ABDOMEN:  Soft, nontender. EXTREMITIES:  Warm, well perfused without edema. SKIN:  Without rash. NEUROLOGIC:  Nonfocal exam.  DIAGNOSTIC STUDIES:  Carotid duplex studies in the past were then reviewed and shows a 60-79% stenosis on the left side the right side is without significant stenosis.  The left side stenosis is in the mid upper end of the scale. CT angiogram:  A 68% stenosis in the left internal carotid artery. CTA of the head, no intracranial hemorrhage, mild to moderate small vessel disease. MRI of the brain, atrophy and chronic small vessel disease.  ASSESSMENT:  Moderate left carotid stenosis in the setting of possible transient ischemic attack.  PLAN:  I spent approximately 45  minutes discussing the patient's clinical picture with the daughter and wife as well as the patient.  We discussed all the treatment options including medical management versus carotid endarterectomy.  I discussed the pros and cons of carotid endarterectomy as well as the risks including the risk of nerve injury and the risk of stroke.  At this point in time, we feel that this most likely represents a transient ischemic attack given his episode of expressive aphasia.  He is in the process of trying to gain his strength back.  At this point, we are leaning towards proceeding with  left carotid endarterectomy; however, we are going to give him for several days to see if he recuperates with his strength and mobility.  I will be speaking with the family over the weekend to make plans for possible carotid endarterectomy.     Jorge Ny, MD     VWB/MEDQ  D:  09/14/2011  T:  09/14/2011  Job:  [id number]

## 2011-09-18 ENCOUNTER — Encounter: Payer: Self-pay | Admitting: Surgery

## 2011-09-19 ENCOUNTER — Encounter: Payer: Self-pay | Admitting: Surgery

## 2011-09-22 ENCOUNTER — Ambulatory Visit: Payer: Medicare Other | Admitting: Surgery

## 2011-09-26 ENCOUNTER — Other Ambulatory Visit: Payer: Medicare Other

## 2011-10-07 NOTE — Discharge Summary (Signed)
NAMEMarland Wright  ANKITH, EDMONSTON NO.:  [id number]  MEDICAL RECORD NO.:  [id number]  LOCATION:  3703                         FACILITY:  [facility]  PHYSICIAN:  Georgann Housekeeper, MD      DATE OF BIRTH:  [birth year]  DATE OF ADMISSION:  09/09/2011 DATE OF DISCHARGE:  09/13/2011                              DISCHARGE SUMMARY   DISCHARGE DIAGNOSES: 1. Weakness with the spinal stenosis, chronic back pain, and leg pain. 2. Possible transient ischemic attack. 3. Carotid disease with left carotid stenosis with complex plaque 60%-     79% with intervention of carotid endarterectomy planned for next     week. 4. Hypertension. 5. Mild bradycardia, resolved with adjustment of medication. 6. Polymyalgia rheumatica, on chronic Coumadin. 7. Hypothyroidism. 8. Chronic kidney disease, stage III. 9. History of coronary artery disease. 10.History of left bundle-branch block. 11.History of nephrectomy in 1999.  MEDICATION ON DISCHARGE: 1. Amlodipine 2.5 mg daily. 2. Plavix 75 mg daily. 3. Doxazosin 4 mg at bedtime. 4. Aspirin 81 mg daily. 5. Synthroid 75 mcg daily. 6. Multivitamin 1 daily. 7. Os-Cal 1 daily. 8. Prednisone 5 mg daily. 9. Tylenol 500 one q.4-6 h. p.r.n. As far as his radiology studies, included CT scan of the head was negative for acute changes.  Chest x-ray was negative.  He had the MRI of the cervical spine, which showed chronic degenerative changes of the cervical spine with bulging disk and mild herniation of different levels.  MRI of the brain was negative for any acute stroke.  CT angio of the head and neck showed left carotid 60%-79% with moderate complex plaque.  A 2-D echocardiogram was done and showed an EF of 60%-65% without any thrombus.  LABORATORY DATA:  Cardiac markers were negative.  Cholesterol was total 160 with the LDL of 87.  Blood counts:  Hemoglobin of 12, white count of 6.6, platelets 113.  Creatinine 1.5.  TSH was 0.9.  Sed rate was 13. Vitamin B12  level, folate level, and cortisol level and RPR was all negative.  CONSULTATION:  Neurology, Vascular Surgery, and Cardiology.  Physical therapy.  HOSPITAL COURSE:  An 77 year old male admitted with weakness and questionable change in mental status. 1. Change in mental status.  The patient had CT scan of the head     negative.  MRI was negative.  Neurology was consulted.  Possible     thought was a TIA.  He had carotid disease with left carotid     complex plaque seen on the CT angio of the head and neck.  The     patient was started on Plavix and continued aspirin lower dose.     Vascular Surgery was consulted.  After discussion with the family,     decided he may benefit from left carotid endarterectomy, which will     be scheduled for next week, outpatient, to see how the patient does     with getting stronger with physical therapy.  As far as his lower     back pain, chronic lumbar, and cervical disk disease, he has a     followup with Neurosurgery Outpatient.  He had degenerative disk  disease.  Pain control with Tylenol.  Hydrocodone according to his     daughter was making him groggy and change in mental status from the     medication.  He did okay with the Tylenol in the hospital.     Continue that for the pain.  He is doing physical therapy, which     will benefit him for his leg weakness and pain. 2. Hypertension.  The patient had mild episode of bradycardia.  His     doxazosin was decreased to 4 mg.  Amlodipine was added.  His blood     pressure became more stable. 3. Polymyalgia rheumatica.  His sed rate was normal.  He will continue     on the prednisone 5 mg at his home dose. 4. Hypothyroidism.  TSH is normal.  Continue on Synthroid 75 mcg.     Clinically euthyroid.  As far as chronic kidney disease, stable,     stage III.  Creatinine 1.5. 5. CAD.  Cardiology was consulted for preop risk assessment and     moderate risk going into the endarterectomy.  No other      interventions or further testing required by the Cardiology.  The     patient will go home with physical therapy and follow up with     Vascular next week for carotid endarterectomy, and I have discussed     the case with the daughter and the plan. Arrange for home health and discussion with the family spent over 45 minutes.     Georgann Housekeeper, MD     KH/MEDQ  D:  09/13/2011  T:  09/13/2011  Job:  [id number]  Electronically Signed by Georgann Housekeeper MD on 10/07/2011 10:18:24 PM

## 2011-11-12 DIAGNOSIS — L570 Actinic keratosis: Secondary | ICD-10-CM | POA: Diagnosis not present

## 2011-11-18 ENCOUNTER — Ambulatory Visit (INDEPENDENT_AMBULATORY_CARE_PROVIDER_SITE_OTHER): Payer: Medicare Other | Admitting: Vascular Surgery

## 2011-11-18 ENCOUNTER — Encounter: Payer: Self-pay | Admitting: Vascular Surgery

## 2011-11-18 ENCOUNTER — Ambulatory Visit (INDEPENDENT_AMBULATORY_CARE_PROVIDER_SITE_OTHER): Payer: Medicare Other | Admitting: *Deleted

## 2011-11-18 VITALS — BP 154/73 | HR 94 | Resp 18 | Ht 70.5 in | Wt 180.0 lb

## 2011-11-18 DIAGNOSIS — I6529 Occlusion and stenosis of unspecified carotid artery: Secondary | ICD-10-CM | POA: Diagnosis not present

## 2011-11-18 DIAGNOSIS — G459 Transient cerebral ischemic attack, unspecified: Secondary | ICD-10-CM

## 2011-11-18 NOTE — Progress Notes (Signed)
Subjective:     Patient ID: Barry Wright, male   DOB: 08/29/1920, 78 y.o.   MRN: [id number]  HPI this 78 year old male patient well-known to me returns today for further discussion about possible left carotid endarterectomy the patient was in the hospital in October of 2012 with a left brain TIA with expressive aphasia lasting 10-15 minutes. CT scan and MRI scan did not show a stroke. He has done well with home physical therapy and has a known 70% left internal carotid stenosis. He has had no further neurologic symptoms taking aspirin and Plavix.  Past Medical History  Diagnosis Date  . Chronic back pain   . Hypercholesteremia   . Hypothyroidism   . CAD (coronary artery disease)   . Renal insufficiency   . Polymyalgia rheumatica   . Left bundle branch block   . Hx of CABG   . History of nephrectomy   . Internal carotid artery stenosis   . Myocardial infarction   . CHF (congestive heart failure)   . Stroke     History  Substance Use Topics  . Smoking status: Former Smoker    Types: Cigarettes    Quit date: 11/10/1970  . Smokeless tobacco: Not on file  . Alcohol Use: No    Family History  Problem Relation Age of Onset  . Other Mother     [place] Flu  . Heart disease Father     Allergies  Allergen Reactions  . Penicillins Rash    Current outpatient prescriptions:amLODipine (NORVASC) 2.5 MG tablet, Take 2.5 mg by mouth daily.  , Disp: , Rfl: ;  aspirin 325 MG tablet, Take 81 mg by mouth daily. , Disp: , Rfl: ;  calcium carbonate (OS-CAL - DOSED IN MG OF ELEMENTAL CALCIUM) 1250 MG tablet, Take 1 tablet by mouth daily.  , Disp: , Rfl: ;  clopidogrel (PLAVIX) 75 MG tablet, Take 75 mg by mouth daily.  , Disp: , Rfl:  doxazosin (CARDURA) 8 MG tablet, Take 8 mg by mouth daily.  , Disp: , Rfl: ;  ezetimibe-simvastatin (VYTORIN) 10-20 MG per tablet, Take 1 tablet by mouth daily.  , Disp: , Rfl: ;  levothyroxine (SYNTHROID, LEVOTHROID) 50 MCG tablet, Take 50 mcg by mouth daily.  ,  Disp: , Rfl: ;  Multiple Vitamins-Minerals (MULTIVITAMINS THER. W/MINERALS) TABS, Take 1 tablet by mouth daily.  , Disp: , Rfl:  predniSONE (DELTASONE) 5 MG tablet, Take 5 mg by mouth daily.  , Disp: , Rfl: ;  furosemide (LASIX) 20 MG tablet, Take 20 mg by mouth 2 (two) times daily.  , Disp: , Rfl: ;  HYDROcodone-acetaminophen (NORCO) 5-325 MG per tablet, Take 1 tablet by mouth every 6 (six) hours.  , Disp: , Rfl:   BP 154/73  Pulse 94  Resp 18  Ht 5' 10.5" (1.791 m)  Wt 180 lb (81.647 kg)  BMI 25.46 kg/m2  Body mass index is 25.46 kg/(m^2).        Review of Systems denies active chest pain, dyspnea on exertion, PND, orthopnea. He does have chronic back pain, does not ambulate long distances, denies any neurologic symptoms since his discharge from the hospital.    Objective:   Physical Exam pressure 1 5473 heart rate 94 respirations 18 General well-developed well-nourished elderly male no apparent distress alert and oriented x3 HEENT normal for age Lungs no rhonchi or wheezing Cardiovascular regular rhythm no murmurs carotid pulses 3+ soft bruit on the left no bruit on the right Neurologic  normal Abdomen soft nontender with no palpable masses Muscle skeletal free major deformities Skin free of rashes Lower extremity exam reveals 3+ femoral and posterior tibial pulses palpable bilaterally no edema noted  Today I ordered carotid duplex exam which I reviewed and interpreted. His left internal carotid stenosis has worsened since our last study here one year ago. He has an approximate 75% irregular left internal carotid stenosis.    Assessment:     Left brain TIA 2 months ago with moderately severe left internal carotid stenosis I would recommend left carotid endarterectomy in this elderly gentleman. He is stable from a cardiovascular standpoint. He was seen by Dr. Verdis Prime while in the hospital and felt to be a moderate risk. No further tests were recommended preoperatively.      Plan:     Patient will discuss this with his family and decide whether to proceed with left carotid surgery. Risks and benefits were thoroughly discussed with him and his wife today. He does opt for surgery will hold Plavix about 4 days preoperatively.

## 2011-11-25 NOTE — Procedures (Unsigned)
CAROTID DUPLEX EXAM  INDICATION:  Follow up carotid disease; repeat status post possible TIA.  HISTORY: Diabetes:  No. Cardiac:  Yes. Hypertension:  Yes. Smoking:  Previous. Previous Surgery: CV History:  Possible TIA, 09/09/11. Amaurosis Fugax No, Paresthesias No, Hemiparesis No.                                      RIGHT             LEFT Brachial systolic pressure:         168               154 Brachial Doppler waveforms:         WNL               WNL Vertebral direction of flow:                          Antegrade DUPLEX VELOCITIES (cm/sec) CCA peak systolic                                     89 ECA peak systolic                                     141 ICA peak systolic                                     271 ICA end diastolic                                     65 PLAQUE MORPHOLOGY:                                    Irregular PLAQUE AMOUNT:                                        Moderate-to-severe PLAQUE LOCATION:                                      CCA/ICA  IMPRESSION: 1. 60% to 79% left internal carotid artery stenosis with irregular     plaque extending approximately 1.6 cm from the bifurcation. 2. Essentially normal left vertebral artery and external carotid     artery. 3. Right side not evaluated on today's examination.  ___________________________________________ Quita Skye Hart [place], M.D.  LT/MEDQ  D:  11/18/2011  T:  11/18/2011  Job:  [id number]

## 2011-12-17 DIAGNOSIS — M2550 Pain in unspecified joint: Secondary | ICD-10-CM | POA: Diagnosis not present

## 2011-12-17 DIAGNOSIS — I779 Disorder of arteries and arterioles, unspecified: Secondary | ICD-10-CM | POA: Diagnosis not present

## 2011-12-17 DIAGNOSIS — M545 Low back pain, unspecified: Secondary | ICD-10-CM | POA: Diagnosis not present

## 2012-01-09 DIAGNOSIS — J069 Acute upper respiratory infection, unspecified: Secondary | ICD-10-CM | POA: Diagnosis not present

## 2012-01-29 DIAGNOSIS — M353 Polymyalgia rheumatica: Secondary | ICD-10-CM | POA: Diagnosis not present

## 2012-01-29 DIAGNOSIS — Z1331 Encounter for screening for depression: Secondary | ICD-10-CM | POA: Diagnosis not present

## 2012-01-29 DIAGNOSIS — M545 Low back pain, unspecified: Secondary | ICD-10-CM | POA: Diagnosis not present

## 2012-02-11 ENCOUNTER — Telehealth: Payer: Self-pay | Admitting: *Deleted

## 2012-02-11 DIAGNOSIS — H11159 Pinguecula, unspecified eye: Secondary | ICD-10-CM | POA: Diagnosis not present

## 2012-02-11 DIAGNOSIS — H2510 Age-related nuclear cataract, unspecified eye: Secondary | ICD-10-CM | POA: Diagnosis not present

## 2012-02-11 DIAGNOSIS — H26499 Other secondary cataract, unspecified eye: Secondary | ICD-10-CM | POA: Diagnosis not present

## 2012-02-11 NOTE — Telephone Encounter (Signed)
I called patient to see if he had made a decision regarding scheduling surgery for Left CEA and had left a message.Barry Wright called back to say at this time he had decided not to do surgery and would call back if he changed his mind.

## 2012-02-18 DIAGNOSIS — H2510 Age-related nuclear cataract, unspecified eye: Secondary | ICD-10-CM | POA: Diagnosis not present

## 2012-02-23 DIAGNOSIS — IMO0002 Reserved for concepts with insufficient information to code with codable children: Secondary | ICD-10-CM | POA: Diagnosis not present

## 2012-02-23 DIAGNOSIS — H2510 Age-related nuclear cataract, unspecified eye: Secondary | ICD-10-CM | POA: Diagnosis not present

## 2012-03-29 DIAGNOSIS — IMO0002 Reserved for concepts with insufficient information to code with codable children: Secondary | ICD-10-CM | POA: Diagnosis not present

## 2012-03-29 DIAGNOSIS — H26499 Other secondary cataract, unspecified eye: Secondary | ICD-10-CM | POA: Diagnosis not present

## 2012-04-07 DIAGNOSIS — I10 Essential (primary) hypertension: Secondary | ICD-10-CM | POA: Diagnosis not present

## 2012-04-07 DIAGNOSIS — M25519 Pain in unspecified shoulder: Secondary | ICD-10-CM | POA: Diagnosis not present

## 2012-04-07 DIAGNOSIS — M353 Polymyalgia rheumatica: Secondary | ICD-10-CM | POA: Diagnosis not present

## 2012-04-19 DIAGNOSIS — M19019 Primary osteoarthritis, unspecified shoulder: Secondary | ICD-10-CM | POA: Diagnosis not present

## 2012-04-19 DIAGNOSIS — M719 Bursopathy, unspecified: Secondary | ICD-10-CM | POA: Diagnosis not present

## 2012-04-19 DIAGNOSIS — M19049 Primary osteoarthritis, unspecified hand: Secondary | ICD-10-CM | POA: Diagnosis not present

## 2012-04-19 DIAGNOSIS — M67919 Unspecified disorder of synovium and tendon, unspecified shoulder: Secondary | ICD-10-CM | POA: Diagnosis not present

## 2012-04-21 DIAGNOSIS — M353 Polymyalgia rheumatica: Secondary | ICD-10-CM | POA: Diagnosis not present

## 2012-05-03 DIAGNOSIS — M19019 Primary osteoarthritis, unspecified shoulder: Secondary | ICD-10-CM | POA: Diagnosis not present

## 2012-05-03 DIAGNOSIS — M19049 Primary osteoarthritis, unspecified hand: Secondary | ICD-10-CM | POA: Diagnosis not present

## 2012-05-03 DIAGNOSIS — M67919 Unspecified disorder of synovium and tendon, unspecified shoulder: Secondary | ICD-10-CM | POA: Diagnosis not present

## 2012-05-03 DIAGNOSIS — M719 Bursopathy, unspecified: Secondary | ICD-10-CM | POA: Diagnosis not present

## 2012-06-16 DIAGNOSIS — H26499 Other secondary cataract, unspecified eye: Secondary | ICD-10-CM | POA: Diagnosis not present

## 2012-06-16 DIAGNOSIS — H11159 Pinguecula, unspecified eye: Secondary | ICD-10-CM | POA: Diagnosis not present

## 2012-06-16 DIAGNOSIS — H59029 Cataract (lens) fragments in eye following cataract surgery, unspecified eye: Secondary | ICD-10-CM | POA: Diagnosis not present

## 2012-06-16 DIAGNOSIS — H43319 Vitreous membranes and strands, unspecified eye: Secondary | ICD-10-CM | POA: Diagnosis not present

## 2012-06-17 DIAGNOSIS — I6789 Other cerebrovascular disease: Secondary | ICD-10-CM | POA: Diagnosis not present

## 2012-06-17 DIAGNOSIS — I10 Essential (primary) hypertension: Secondary | ICD-10-CM | POA: Diagnosis not present

## 2012-06-17 DIAGNOSIS — I2510 Atherosclerotic heart disease of native coronary artery without angina pectoris: Secondary | ICD-10-CM | POA: Diagnosis not present

## 2012-07-09 DIAGNOSIS — M353 Polymyalgia rheumatica: Secondary | ICD-10-CM | POA: Diagnosis not present

## 2012-07-09 DIAGNOSIS — M545 Low back pain, unspecified: Secondary | ICD-10-CM | POA: Diagnosis not present

## 2012-07-09 DIAGNOSIS — I10 Essential (primary) hypertension: Secondary | ICD-10-CM | POA: Diagnosis not present

## 2012-07-21 DIAGNOSIS — M064 Inflammatory polyarthropathy: Secondary | ICD-10-CM | POA: Diagnosis not present

## 2012-07-21 DIAGNOSIS — R5381 Other malaise: Secondary | ICD-10-CM | POA: Diagnosis not present

## 2012-07-21 DIAGNOSIS — R5383 Other fatigue: Secondary | ICD-10-CM | POA: Diagnosis not present

## 2012-07-21 DIAGNOSIS — M2550 Pain in unspecified joint: Secondary | ICD-10-CM | POA: Diagnosis not present

## 2012-07-21 DIAGNOSIS — M545 Low back pain: Secondary | ICD-10-CM | POA: Diagnosis not present

## 2012-07-21 DIAGNOSIS — M353 Polymyalgia rheumatica: Secondary | ICD-10-CM | POA: Diagnosis not present

## 2012-07-21 DIAGNOSIS — M4800 Spinal stenosis, site unspecified: Secondary | ICD-10-CM | POA: Diagnosis not present

## 2012-08-11 DIAGNOSIS — Z23 Encounter for immunization: Secondary | ICD-10-CM | POA: Diagnosis not present

## 2012-08-25 DIAGNOSIS — M064 Inflammatory polyarthropathy: Secondary | ICD-10-CM | POA: Diagnosis not present

## 2012-08-25 DIAGNOSIS — M353 Polymyalgia rheumatica: Secondary | ICD-10-CM | POA: Diagnosis not present

## 2012-08-25 DIAGNOSIS — IMO0002 Reserved for concepts with insufficient information to code with codable children: Secondary | ICD-10-CM | POA: Diagnosis not present

## 2012-08-25 DIAGNOSIS — M2550 Pain in unspecified joint: Secondary | ICD-10-CM | POA: Diagnosis not present

## 2012-08-25 DIAGNOSIS — M1990 Unspecified osteoarthritis, unspecified site: Secondary | ICD-10-CM | POA: Diagnosis not present

## 2012-08-25 DIAGNOSIS — M545 Low back pain: Secondary | ICD-10-CM | POA: Diagnosis not present

## 2012-09-29 DIAGNOSIS — M899 Disorder of bone, unspecified: Secondary | ICD-10-CM | POA: Diagnosis not present

## 2012-09-29 DIAGNOSIS — IMO0002 Reserved for concepts with insufficient information to code with codable children: Secondary | ICD-10-CM | POA: Diagnosis not present

## 2012-09-29 DIAGNOSIS — M949 Disorder of cartilage, unspecified: Secondary | ICD-10-CM | POA: Diagnosis not present

## 2012-10-13 DIAGNOSIS — I10 Essential (primary) hypertension: Secondary | ICD-10-CM | POA: Diagnosis not present

## 2012-10-13 DIAGNOSIS — N183 Chronic kidney disease, stage 3 unspecified: Secondary | ICD-10-CM | POA: Diagnosis not present

## 2012-10-13 DIAGNOSIS — M545 Low back pain, unspecified: Secondary | ICD-10-CM | POA: Diagnosis not present

## 2012-10-19 DIAGNOSIS — H26499 Other secondary cataract, unspecified eye: Secondary | ICD-10-CM | POA: Diagnosis not present

## 2012-10-21 DIAGNOSIS — R69 Illness, unspecified: Secondary | ICD-10-CM | POA: Diagnosis not present

## 2012-10-21 DIAGNOSIS — L821 Other seborrheic keratosis: Secondary | ICD-10-CM | POA: Diagnosis not present

## 2012-10-27 DIAGNOSIS — H26499 Other secondary cataract, unspecified eye: Secondary | ICD-10-CM | POA: Diagnosis not present

## 2012-10-29 DIAGNOSIS — M2550 Pain in unspecified joint: Secondary | ICD-10-CM | POA: Diagnosis not present

## 2012-10-29 DIAGNOSIS — M353 Polymyalgia rheumatica: Secondary | ICD-10-CM | POA: Diagnosis not present

## 2012-10-29 DIAGNOSIS — M4800 Spinal stenosis, site unspecified: Secondary | ICD-10-CM | POA: Diagnosis not present

## 2012-10-29 DIAGNOSIS — M899 Disorder of bone, unspecified: Secondary | ICD-10-CM | POA: Diagnosis not present

## 2012-10-29 DIAGNOSIS — M545 Low back pain: Secondary | ICD-10-CM | POA: Diagnosis not present

## 2012-10-29 DIAGNOSIS — M949 Disorder of cartilage, unspecified: Secondary | ICD-10-CM | POA: Diagnosis not present

## 2012-10-29 DIAGNOSIS — IMO0002 Reserved for concepts with insufficient information to code with codable children: Secondary | ICD-10-CM | POA: Diagnosis not present

## 2012-12-14 DIAGNOSIS — L600 Ingrowing nail: Secondary | ICD-10-CM | POA: Diagnosis not present

## 2012-12-14 DIAGNOSIS — M79609 Pain in unspecified limb: Secondary | ICD-10-CM | POA: Diagnosis not present

## 2012-12-29 ENCOUNTER — Ambulatory Visit
Admission: RE | Admit: 2012-12-29 | Discharge: 2012-12-29 | Disposition: A | Discharge status: Home / Self Care | Payer: Medicare Other | Source: Ambulatory Visit | Attending: Internal Medicine | Admitting: Internal Medicine

## 2012-12-29 ENCOUNTER — Other Ambulatory Visit: Payer: Self-pay | Admitting: Internal Medicine

## 2012-12-29 DIAGNOSIS — R404 Transient alteration of awareness: Secondary | ICD-10-CM | POA: Diagnosis not present

## 2012-12-29 DIAGNOSIS — R5383 Other fatigue: Secondary | ICD-10-CM | POA: Diagnosis not present

## 2012-12-29 DIAGNOSIS — S0990XA Unspecified injury of head, initial encounter: Secondary | ICD-10-CM | POA: Diagnosis not present

## 2012-12-29 DIAGNOSIS — R5381 Other malaise: Secondary | ICD-10-CM | POA: Diagnosis not present

## 2012-12-29 DIAGNOSIS — S61409A Unspecified open wound of unspecified hand, initial encounter: Secondary | ICD-10-CM | POA: Diagnosis not present

## 2012-12-29 DIAGNOSIS — R32 Unspecified urinary incontinence: Secondary | ICD-10-CM | POA: Diagnosis not present

## 2013-01-03 ENCOUNTER — Emergency Department (HOSPITAL_COMMUNITY): Payer: Medicare Other

## 2013-01-03 ENCOUNTER — Inpatient Hospital Stay (HOSPITAL_COMMUNITY): Payer: Medicare Other

## 2013-01-03 ENCOUNTER — Inpatient Hospital Stay (HOSPITAL_COMMUNITY)
Admission: EM | Admit: 2013-01-03 | Discharge: 2013-01-07 | DRG: 641 | Disposition: A | Discharge status: Home Health | Payer: Medicare Other | Attending: Internal Medicine | Admitting: Internal Medicine

## 2013-01-03 ENCOUNTER — Encounter (HOSPITAL_COMMUNITY): Payer: Self-pay | Admitting: Internal Medicine

## 2013-01-03 DIAGNOSIS — E039 Hypothyroidism, unspecified: Secondary | ICD-10-CM | POA: Diagnosis present

## 2013-01-03 DIAGNOSIS — Z8673 Personal history of transient ischemic attack (TIA), and cerebral infarction without residual deficits: Secondary | ICD-10-CM | POA: Diagnosis not present

## 2013-01-03 DIAGNOSIS — K5900 Constipation, unspecified: Secondary | ICD-10-CM | POA: Diagnosis present

## 2013-01-03 DIAGNOSIS — R32 Unspecified urinary incontinence: Secondary | ICD-10-CM | POA: Diagnosis not present

## 2013-01-03 DIAGNOSIS — N179 Acute kidney failure, unspecified: Secondary | ICD-10-CM | POA: Diagnosis present

## 2013-01-03 DIAGNOSIS — R5383 Other fatigue: Secondary | ICD-10-CM

## 2013-01-03 DIAGNOSIS — R5381 Other malaise: Secondary | ICD-10-CM

## 2013-01-03 DIAGNOSIS — S298XXA Other specified injuries of thorax, initial encounter: Secondary | ICD-10-CM | POA: Diagnosis not present

## 2013-01-03 DIAGNOSIS — I252 Old myocardial infarction: Secondary | ICD-10-CM | POA: Diagnosis not present

## 2013-01-03 DIAGNOSIS — G8929 Other chronic pain: Secondary | ICD-10-CM | POA: Diagnosis present

## 2013-01-03 DIAGNOSIS — IMO0002 Reserved for concepts with insufficient information to code with codable children: Secondary | ICD-10-CM | POA: Diagnosis not present

## 2013-01-03 DIAGNOSIS — N183 Chronic kidney disease, stage 3 unspecified: Secondary | ICD-10-CM | POA: Diagnosis not present

## 2013-01-03 DIAGNOSIS — Z7902 Long term (current) use of antithrombotics/antiplatelets: Secondary | ICD-10-CM | POA: Diagnosis not present

## 2013-01-03 DIAGNOSIS — I129 Hypertensive chronic kidney disease with stage 1 through stage 4 chronic kidney disease, or unspecified chronic kidney disease: Secondary | ICD-10-CM | POA: Diagnosis present

## 2013-01-03 DIAGNOSIS — Z9181 History of falling: Secondary | ICD-10-CM | POA: Diagnosis not present

## 2013-01-03 DIAGNOSIS — M353 Polymyalgia rheumatica: Secondary | ICD-10-CM | POA: Diagnosis present

## 2013-01-03 DIAGNOSIS — R778 Other specified abnormalities of plasma proteins: Secondary | ICD-10-CM | POA: Diagnosis not present

## 2013-01-03 DIAGNOSIS — E876 Hypokalemia: Secondary | ICD-10-CM | POA: Diagnosis present

## 2013-01-03 DIAGNOSIS — K2970 Gastritis, unspecified, without bleeding: Secondary | ICD-10-CM | POA: Diagnosis not present

## 2013-01-03 DIAGNOSIS — I779 Disorder of arteries and arterioles, unspecified: Secondary | ICD-10-CM | POA: Diagnosis present

## 2013-01-03 DIAGNOSIS — I509 Heart failure, unspecified: Secondary | ICD-10-CM | POA: Diagnosis present

## 2013-01-03 DIAGNOSIS — Z7982 Long term (current) use of aspirin: Secondary | ICD-10-CM

## 2013-01-03 DIAGNOSIS — Z951 Presence of aortocoronary bypass graft: Secondary | ICD-10-CM | POA: Diagnosis not present

## 2013-01-03 DIAGNOSIS — R109 Unspecified abdominal pain: Secondary | ICD-10-CM | POA: Diagnosis not present

## 2013-01-03 DIAGNOSIS — M8580 Other specified disorders of bone density and structure, unspecified site: Secondary | ICD-10-CM | POA: Diagnosis present

## 2013-01-03 DIAGNOSIS — I447 Left bundle-branch block, unspecified: Secondary | ICD-10-CM | POA: Diagnosis present

## 2013-01-03 DIAGNOSIS — IMO0001 Reserved for inherently not codable concepts without codable children: Secondary | ICD-10-CM | POA: Diagnosis not present

## 2013-01-03 DIAGNOSIS — I2510 Atherosclerotic heart disease of native coronary artery without angina pectoris: Secondary | ICD-10-CM | POA: Diagnosis present

## 2013-01-03 DIAGNOSIS — S61409A Unspecified open wound of unspecified hand, initial encounter: Secondary | ICD-10-CM | POA: Diagnosis present

## 2013-01-03 DIAGNOSIS — E7800 Pure hypercholesterolemia, unspecified: Secondary | ICD-10-CM | POA: Diagnosis present

## 2013-01-03 DIAGNOSIS — M48061 Spinal stenosis, lumbar region without neurogenic claudication: Secondary | ICD-10-CM | POA: Diagnosis present

## 2013-01-03 DIAGNOSIS — Y998 Other external cause status: Secondary | ICD-10-CM

## 2013-01-03 DIAGNOSIS — R404 Transient alteration of awareness: Secondary | ICD-10-CM | POA: Diagnosis not present

## 2013-01-03 DIAGNOSIS — Y92009 Unspecified place in unspecified non-institutional (private) residence as the place of occurrence of the external cause: Secondary | ICD-10-CM

## 2013-01-03 DIAGNOSIS — R079 Chest pain, unspecified: Secondary | ICD-10-CM | POA: Diagnosis not present

## 2013-01-03 DIAGNOSIS — W010XXA Fall on same level from slipping, tripping and stumbling without subsequent striking against object, initial encounter: Secondary | ICD-10-CM | POA: Diagnosis present

## 2013-01-03 DIAGNOSIS — S8010XA Contusion of unspecified lower leg, initial encounter: Secondary | ICD-10-CM | POA: Diagnosis present

## 2013-01-03 DIAGNOSIS — S3981XA Other specified injuries of abdomen, initial encounter: Secondary | ICD-10-CM | POA: Diagnosis not present

## 2013-01-03 DIAGNOSIS — R531 Weakness: Secondary | ICD-10-CM | POA: Diagnosis present

## 2013-01-03 DIAGNOSIS — M810 Age-related osteoporosis without current pathological fracture: Secondary | ICD-10-CM | POA: Diagnosis present

## 2013-01-03 DIAGNOSIS — Z79899 Other long term (current) drug therapy: Secondary | ICD-10-CM

## 2013-01-03 DIAGNOSIS — M549 Dorsalgia, unspecified: Secondary | ICD-10-CM | POA: Diagnosis present

## 2013-01-03 HISTORY — DX: Chronic kidney disease, stage 3 unspecified: N18.30

## 2013-01-03 HISTORY — DX: Chronic kidney disease, stage 3 (moderate): N18.3

## 2013-01-03 LAB — CBC
MCV: 99.2 fL (ref 78.0–100.0)
Platelets: 169 10*3/uL (ref 150–400)
RDW: 13.5 % (ref 11.5–15.5)
WBC: 10.3 10*3/uL (ref 4.0–10.5)

## 2013-01-03 LAB — URINALYSIS, ROUTINE W REFLEX MICROSCOPIC
Bilirubin Urine: NEGATIVE
Ketones, ur: NEGATIVE mg/dL
Leukocytes, UA: NEGATIVE
Nitrite: NEGATIVE
Protein, ur: NEGATIVE mg/dL

## 2013-01-03 LAB — COMPREHENSIVE METABOLIC PANEL
ALT: 16 U/L (ref 0–53)
AST: 20 U/L (ref 0–37)
Alkaline Phosphatase: 55 U/L (ref 39–117)
CO2: 28 mEq/L (ref 19–32)
Calcium: 9.4 mg/dL (ref 8.4–10.5)
Chloride: 103 mEq/L (ref 96–112)
GFR calc Af Amer: 31 mL/min — ABNORMAL LOW (ref >90)
GFR calc non Af Amer: 27 mL/min — ABNORMAL LOW (ref >90)
Glucose, Bld: 127 mg/dL — ABNORMAL HIGH (ref 70–99)
Sodium: 142 mEq/L (ref 135–145)
Total Bilirubin: 0.9 mg/dL (ref 0.3–1.2)

## 2013-01-03 LAB — CBC WITH DIFFERENTIAL/PLATELET
Basophils Absolute: 0 10*3/uL (ref 0.0–0.1)
Eosinophils Relative: 2 % (ref 0–5)
HCT: 35.4 % — ABNORMAL LOW (ref 39.0–52.0)
Lymphocytes Relative: 14 % (ref 12–46)
Lymphs Abs: 1.4 10*3/uL (ref 0.7–4.0)
MCV: 98.1 fL (ref 78.0–100.0)
Neutro Abs: 8 10*3/uL — ABNORMAL HIGH (ref 1.7–7.7)
Platelets: 159 10*3/uL (ref 150–400)
RBC: 3.61 MIL/uL — ABNORMAL LOW (ref 4.22–5.81)
RDW: 13.5 % (ref 11.5–15.5)
WBC: 10.3 10*3/uL (ref 4.0–10.5)

## 2013-01-03 LAB — TROPONIN I: Troponin I: <0.3 ng/mL (ref <0.30)

## 2013-01-03 LAB — PHOSPHORUS: Phosphorus: 3.2 mg/dL (ref 2.3–4.6)

## 2013-01-03 LAB — CREATININE, SERUM: GFR calc Af Amer: 31 mL/min — ABNORMAL LOW (ref >90)

## 2013-01-03 LAB — SEDIMENTATION RATE: Sed Rate: 25 mm/hr — ABNORMAL HIGH (ref 0–16)

## 2013-01-03 MED ORDER — SODIUM CHLORIDE 0.9 % IV SOLN: INTRAVENOUS | Status: DC

## 2013-01-03 MED ORDER — AMLODIPINE BESYLATE 2.5 MG PO TABS
2.5000 mg | ORAL_TABLET | Freq: Every day | ORAL | Status: DC
  Administered 2013-01-03 – 2013-01-07 (×5): 2.5 mg via ORAL
  Filled 2013-01-03 (×5): qty 1

## 2013-01-03 MED ORDER — ACETAMINOPHEN 325 MG PO TABS: 650.0000 mg | ORAL_TABLET | Freq: Four times a day (QID) | ORAL | Status: DC | PRN

## 2013-01-03 MED ORDER — CLOPIDOGREL BISULFATE 75 MG PO TABS
75.0000 mg | ORAL_TABLET | Freq: Every day | ORAL | Status: DC
  Administered 2013-01-03 – 2013-01-07 (×5): 75 mg via ORAL
  Filled 2013-01-03 (×5): qty 1

## 2013-01-03 MED ORDER — ATORVASTATIN CALCIUM 20 MG PO TABS
20.0000 mg | ORAL_TABLET | Freq: Every day | ORAL | Status: DC
  Administered 2013-01-03 – 2013-01-07 (×5): 20 mg via ORAL
  Filled 2013-01-03 (×5): qty 1

## 2013-01-03 MED ORDER — LEVALBUTEROL HCL 0.63 MG/3ML IN NEBU
0.6300 mg | INHALATION_SOLUTION | Freq: Four times a day (QID) | RESPIRATORY_TRACT | Status: DC | PRN
  Filled 2013-01-03: qty 3

## 2013-01-03 MED ORDER — ENOXAPARIN SODIUM 30 MG/0.3ML ~~LOC~~ SOLN
30.0000 mg | SUBCUTANEOUS | Status: DC
  Administered 2013-01-03 – 2013-01-06 (×4): 30 mg via SUBCUTANEOUS
  Filled 2013-01-03 (×5): qty 0.3

## 2013-01-03 MED ORDER — DOXAZOSIN MESYLATE 4 MG PO TABS
4.0000 mg | ORAL_TABLET | Freq: Every day | ORAL | Status: DC
  Administered 2013-01-03 – 2013-01-06 (×4): 4 mg via ORAL
  Filled 2013-01-03 (×5): qty 1

## 2013-01-03 MED ORDER — ASPIRIN EC 81 MG PO TBEC
81.0000 mg | DELAYED_RELEASE_TABLET | Freq: Every day | ORAL | Status: DC
  Administered 2013-01-04 – 2013-01-07 (×4): 81 mg via ORAL
  Filled 2013-01-03 (×4): qty 1

## 2013-01-03 MED ORDER — LEVOTHYROXINE SODIUM 75 MCG PO TABS
75.0000 ug | ORAL_TABLET | Freq: Every day | ORAL | Status: DC
  Administered 2013-01-04 – 2013-01-07 (×4): 75 ug via ORAL
  Filled 2013-01-03 (×5): qty 1

## 2013-01-03 MED ORDER — SODIUM CHLORIDE 0.9 % IJ SOLN
3.0000 mL | Freq: Two times a day (BID) | INTRAMUSCULAR | Status: DC
  Administered 2013-01-05 – 2013-01-07 (×5): 3 mL via INTRAVENOUS

## 2013-01-03 MED ORDER — ACETAMINOPHEN 650 MG RE SUPP: 650.0000 mg | Freq: Four times a day (QID) | RECTAL | Status: DC | PRN

## 2013-01-03 MED ORDER — ONDANSETRON HCL 4 MG/2ML IJ SOLN: 4.0000 mg | Freq: Four times a day (QID) | INTRAMUSCULAR | Status: DC | PRN

## 2013-01-03 MED ORDER — SENNA 8.6 MG PO TABS
1.0000 | ORAL_TABLET | Freq: Two times a day (BID) | ORAL | Status: DC
  Administered 2013-01-03 – 2013-01-07 (×8): 8.6 mg via ORAL
  Filled 2013-01-03 (×9): qty 1

## 2013-01-03 MED ORDER — TRAMADOL HCL 50 MG PO TABS
50.0000 mg | ORAL_TABLET | Freq: Two times a day (BID) | ORAL | Status: DC
  Administered 2013-01-03 – 2013-01-07 (×8): 50 mg via ORAL
  Filled 2013-01-03 (×9): qty 1

## 2013-01-03 MED ORDER — ONDANSETRON HCL 4 MG PO TABS: 4.0000 mg | ORAL_TABLET | Freq: Four times a day (QID) | ORAL | Status: DC | PRN

## 2013-01-03 MED ORDER — POTASSIUM CHLORIDE IN NACL 40-0.9 MEQ/L-% IV SOLN
INTRAVENOUS | Status: DC
  Administered 2013-01-03: 18:00:00 via INTRAVENOUS
  Filled 2013-01-03 (×6): qty 1000

## 2013-01-03 MED ORDER — CYCLOSPORINE 0.05 % OP EMUL
1.0000 [drp] | Freq: Two times a day (BID) | OPHTHALMIC | Status: DC
  Administered 2013-01-03 – 2013-01-07 (×8): 1 [drp] via OPHTHALMIC
  Filled 2013-01-03 (×9): qty 1

## 2013-01-03 MED ORDER — OXYCODONE HCL 5 MG PO TABS: 5.0000 mg | ORAL_TABLET | ORAL | Status: DC | PRN

## 2013-01-03 MED ORDER — PREDNISONE 10 MG PO TABS
10.0000 mg | ORAL_TABLET | Freq: Every day | ORAL | Status: DC
  Administered 2013-01-03 – 2013-01-07 (×5): 10 mg via ORAL
  Filled 2013-01-03 (×6): qty 1

## 2013-01-03 MED ORDER — DOCUSATE SODIUM 100 MG PO CAPS
100.0000 mg | ORAL_CAPSULE | Freq: Two times a day (BID) | ORAL | Status: DC
  Administered 2013-01-03 – 2013-01-07 (×8): 100 mg via ORAL
  Filled 2013-01-03 (×10): qty 1

## 2013-01-03 MED ORDER — POTASSIUM CHLORIDE CRYS ER 20 MEQ PO TBCR
40.0000 meq | EXTENDED_RELEASE_TABLET | Freq: Once | ORAL | Status: AC
  Administered 2013-01-03: 40 meq via ORAL
  Filled 2013-01-03: qty 2

## 2013-01-03 MED ORDER — ASPIRIN 81 MG PO TABS: 81.0000 mg | ORAL_TABLET | Freq: Every day | ORAL | Status: DC

## 2013-01-03 NOTE — ED Notes (Signed)
Patient transported to X-ray 

## 2013-01-03 NOTE — ED Notes (Signed)
Patient asked for urine sample. 

## 2013-01-03 NOTE — H&P (Addendum)
[name] History and Physical  Barry Wright ZOX:[id number] DOB: December 11, 1919 DOA: 01/03/2013  Referring physician: ED PCP: Darnelle Bos, MD   Chief Complaint: Constipation    HPI:  79 year old male with a history of polymyalgia rheumatica, who presented to his primary care doctor's office with falls, weakness, and hypercalcemia. He has a h/o PMR that was diagnosed in 1999. He was on prednisone that was tapered but could never be stopped. He was chronically on 3 mg daily. He had worsening pain in late 2013 and prednisone was increased. Eventually he required doses of about 20 mg to maintain pain relief. He fell on 2/13 as he was climbing up the steps. His legs were weak and he could not make it up the last step. He fell forward and then he slid all the way down the steps. He could not get up.   EMTs were called and they were able to get him up.   Since then he has been pretty much staying in the wheelchair. He has been using his walker. He has had difficulty getting up out of bed.  He now has 24 hour assistance .Prior to all this he had begun to notice that his legs were weak. This had been going on for several days. Since his fall, he has been a bit more disoriented at times, he is more lethargic and weak. The disorientation has been intermittent. He has had more dry mouth. He has been incontinent of urine at times. He has also been more constipated. In the ER he was noted to have fecal impaction           Review of Systems: negative for the following  Constitutional: Denies fever, chills, diaphoresis, appetite change and fatigue.  HEENT: Denies photophobia, eye pain, redness, hearing loss, ear pain, congestion, sore throat, rhinorrhea, sneezing, mouth sores, trouble swallowing, neck pain, neck stiffness and tinnitus.  Respiratory: Denies SOB, DOE, cough, chest tightness, and wheezing.  Cardiovascular: Denies chest pain, palpitations and leg swelling. Positive for  constipation.  Gastrointestinal: Denies nausea, vomiting, abdominal pain, diarrhea, constipation, blood in stool and abdominal distention.  Genitourinary: Denies dysuria, urgency, frequency, hematuria, flank pain and difficulty urinating.  Musculoskeletal: Denies myalgias, back pain, joint swelling, arthralgias and gait problem.  Skin: Denies pallor, rash and wound.  Neurological: Denies dizziness, seizures, syncope, weakness, light-headedness, numbness and headaches.  Hematological: Denies adenopathy. Easy bruising, personal or family bleeding history  Psychiatric/Behavioral: Denies suicidal ideation, mood changes, confusion, nervousness, sleep disturbance and agitation       Past Medical History  Diagnosis Date  . Chronic back pain   . Hypercholesteremia   . Hypothyroidism   . CAD (coronary artery disease)   . Chronic kidney disease (CKD), stage III (moderate)   . Polymyalgia rheumatica   . Left bundle branch block   . Hx of CABG   . History of nephrectomy   . Internal carotid artery stenosis   . Myocardial infarction   . CHF (congestive heart failure)   . Stroke      Past Surgical History  Procedure Laterality Date  . Coronary artery bypass graft    . Cholecystectomy    . Kidney surgery      kidney removed      Social History:  reports that he quit smoking about 42 years ago. His smoking use included Cigarettes. He smoked 0.00 packs per day. He does not have any smokeless tobacco history on file. He reports that he does not drink alcohol. His  drug history is not on file.    Allergies  Allergen Reactions  . Penicillins Rash    Family History  Problem Relation Age of Onset  . Other Mother     [place] Flu  . Heart disease Father      Prior to Admission medications   Medication Sig Start Date End Date Taking? Authorizing Provider  acetaminophen (TYLENOL) 500 MG tablet Take 1,000 mg by mouth every 8 (eight) hours as needed for pain.   Yes Historical Provider,  MD  amLODipine (NORVASC) 2.5 MG tablet Take 2.5 mg by mouth daily.     Yes Historical Provider, MD  aspirin 81 MG tablet Take 81 mg by mouth daily.   Yes Historical Provider, MD  atorvastatin (LIPITOR) 20 MG tablet Take 20 mg by mouth daily.   Yes Historical Provider, MD  clopidogrel (PLAVIX) 75 MG tablet Take 75 mg by mouth daily.     Yes Historical Provider, MD  cycloSPORINE (RESTASIS) 0.05 % ophthalmic emulsion Place 1 drop into both eyes 2 (two) times daily.   Yes Historical Provider, MD  doxazosin (CARDURA) 4 MG tablet Take 4 mg by mouth at bedtime.   Yes Historical Provider, MD  levothyroxine (SYNTHROID, LEVOTHROID) 75 MCG tablet Take 75 mcg by mouth daily.   Yes Historical Provider, MD  Multiple Vitamins-Minerals (MULTIVITAMINS THER. W/MINERALS) TABS Take 1 tablet by mouth daily.     Yes Historical Provider, MD  predniSONE (DELTASONE) 10 MG tablet Take 10 mg by mouth daily.   Yes Historical Provider, MD  traMADol (ULTRAM) 50 MG tablet Take 50 mg by mouth 2 (two) times daily.   Yes Historical Provider, MD     Physical Exam: Filed Vitals:   01/03/13 1336 01/03/13 1338 01/03/13 1625 01/03/13 1652  BP: 146/67 146/67 134/56 138/60  Pulse: 77  78   Temp: 97.8 F (36.6 C) 97.8 F (36.6 C) 98.2 F (36.8 C) 98.3 F (36.8 C)  TempSrc: Oral Oral Oral Oral  Resp: 19 20 27 18   SpO2: 92% 95% 95% 96%     Constitutional: Vital signs reviewed. Patient is a well-developed and well-nourished in no acute distress and cooperative with exam. Alert and oriented x3.  Head: Normocephalic and atraumatic  Ear: TM normal bilaterally  Mouth: no erythema or exudates, MMM  Eyes: PERRL, EOMI, conjunctivae normal, No scleral icterus.  Neck: Supple, Trachea midline normal ROM, No JVD, mass, thyromegaly, or carotid bruit present.  Cardiovascular: RRR, S1 normal, S2 normal, no MRG, pulses symmetric and intact bilaterally  Pulmonary/Chest: CTAB, no wheezes, rales, or rhonchi  Abdominal: Soft. Non-tender,  non-distended, bowel sounds are normal, no masses, organomegaly, or guarding present. Fecal impaction noted  GU: no CVA tenderness Musculoskeletal: No joint deformities, erythema, or stiffness, ROM full and no nontender Ext: no edema and no cyanosis, pulses palpable bilaterally (DP and PT)  Hematology: no cervical, inginal, or axillary adenopathy.  Neurological: A&O x3, Strenght is normal and symmetric bilaterally, cranial nerve II-XII are grossly intact, no focal motor deficit, sensory intact to light touch bilaterally.  Skin: Warm, dry and intact. No rash, cyanosis, or clubbing.  Psychiatric: Normal mood and affect. speech and behavior is normal. Judgment and thought content normal. Cognition and memory are normal.       Labs on Admission:    Basic Metabolic Panel:  Recent Labs Lab 01/03/13 1359  NA 142  K 3.1*  CL 103  CO2 28  GLUCOSE 127*  BUN 27*  CREATININE 2.04*  CALCIUM 9.4  Liver Function Tests:  Recent Labs Lab 01/03/13 1359  AST 20  ALT 16  ALKPHOS 55  BILITOT 0.9  PROT 5.6*  ALBUMIN 2.9*    Recent Labs Lab 01/03/13 1359  LIPASE 14   No results found for this basename: AMMONIA,  in the last 168 hours CBC:  Recent Labs Lab 01/03/13 1359 01/03/13 1637  WBC 10.3 10.3  NEUTROABS 8.0*  --   HGB 12.0* 12.2*  HCT 35.4* 36.6*  MCV 98.1 99.2  PLT 159 169   Cardiac Enzymes:  Recent Labs Lab 01/03/13 1638  TROPONINI <0.30    BNP (last 3 results) No results found for this basename: PROBNP,  in the last 8760 hours    CBG: No results found for this basename: GLUCAP,  in the last 168 hours  Radiological Exams on Admission: Dg Pelvis 1-2 Views  01/03/2013  *RADIOLOGY REPORT*  Clinical Data: Larey Seat down stairs 5 days ago  PELVIS - 1-2 VIEW  Comparison: Abdominal radiograph 01/03/2039  Findings: Osseous demineralization. Hip and SI joints symmetric and preserved. No acute fracture, dislocation or bone destruction. Scattered atherosclerotic  calcifications and pelvic phleboliths.  IMPRESSION: No acute osseous abnormalities.   Original Report Authenticated By: Ulyses Southward, M.D.    Dg Abd Acute W/chest  01/03/2013  *RADIOLOGY REPORT*  Clinical Data: Weakness, pain, fall.  ACUTE ABDOMEN SERIES (ABDOMEN 2 VIEW & CHEST 1 VIEW)  Comparison: 09/09/2011  Findings: Prior CABG.  Heart is upper limits normal in size.  Lungs are clear.  No effusions.  Nonobstructive bowel gas pattern.  No free air organomegaly. Multiple calcifications in the pelvis felt represent phleboliths. Prior cholecystectomy.  Thoracolumbar scoliosis and degenerative changes.  No acute bony abnormality.  IMPRESSION: No acute cardiopulmonary disease.  No evidence of bowel obstruction or free air.   Original Report Authenticated By: Charlett Nose, M.D.     EKG: Independently reviewed.    Assessment/Plan Active Problems:   Weakness   Hypercalcemia   Chronic kidney disease (CKD), stage III (moderate)   Acute on chronic kidney disease, stage 3   Polymyalgia rheumatica   Osteopenia   Carotid artery disease   Spinal stenosis of lumbar region   Coronary artery disease   Old MI (myocardial infarction)   1. Recurrent falls-inability to ambulate pelvic x-ray negative, consistent with osteoporosis, weakness could be secondary to polymyalgia rheumatica,hypercalcemia, hypocalmeia, will check ESR. PT/OT eval 2. History of carotid artery disease, with left ICA stenosis, had a CT of the head on the 19th that was negative. Continue neuro checks 3. Hypothyroidism continue Synthroid and check TSH 4. Polymyalgia rheumatica continue prednisone and check ESR 5. Hypercalcemia hydrate , monitor , PTH , phos 6. AKI hydrate with IVF   Code Status:   full Family Communication: bedside Disposition Plan: admit   Time spent: 70 mins   [name] Pager [phone]  If 7PM-7AM, please contact night-coverage [email or web address] Password [email or web address] 01/03/2013, 6:26 PM

## 2013-01-03 NOTE — ED Notes (Addendum)
C/o constipation. Patient states that last BM was 4-5 days ago. Patient is a questionable historian.  Patient has horizontal bruising across rib cage at bottom of sternal level. Patient states that he has fallen in a swivel chair at home 4 days ago

## 2013-01-03 NOTE — ED Notes (Signed)
Results of lactic acid called to nurse Dorene Grebe, RN

## 2013-01-03 NOTE — Progress Notes (Addendum)
The patient was to be seen in my office today  for falls, weakness, and hypercalcemia. He has a h/o PMR that was diagnosed in 1999. He was on prednisone that was tapered but could never be stopped. He was chronically on 3 mg daily. He had worsening pain in late 2013 and prednisone was increased. Eventually he required doses of about 20 mg to maintain pain relief.  He has a h/o osteopenia dx'ed in 2004. He was on Actonel until 2010 when his BMD improved. In 12/13, he was started on alendronate as he was on a fairly high dose of prednisone.  He fell on 2/13 as he was climbing up the steps. His legs were weak and he could not make it up the last step. He fell forward and then he slid all the way down the steps. He could not get up. A friend could not help him up. EMTs were called and they were able to get him up. They dismissed the EMTs. Later that evening, he was walking to the bedroom with his walker. The walker caught on carpet. He fell forward over the walker and a chair fell on him. EMTs were called again. He was helped to the bed. They dismissed EMTs. Since then he has been pretty much staying in the wheelchair. He has been using his walker. He has had difficulty getting up out of bed.  He now has 24 hour assistance and they have had that since his fall Prior to all this he had begun to notice that his legs were weak. This had been going on for several days. Since his fall, he has been a bit more disoriented at times, he is more lethargic and weak. The disorientation has been intermittent. He has had more dry mouth.  He has been incontinent of urine at times I saw him last week in the office and his calcium was 11.5. We repeated it two days later and it was still elevated at 10.6. A PTH level was low. In addition, his creatinine, which is usually about 1.6 was up to 2.5.  His wife called this morning and he was having much worse constipation and abd pain.  He was to come to office for possible  evaluation to be admitted for dehydration, hypercalcemia, weakness and multiple falls for IVF, monitoring of clinicial status, further evaluation of calcium level. Instead he went to the ED.  Medical History:  chronic kidney disease stage III-s/p nephrectomy x1     PMR-recurrence 2013     lipidemia     osteopenia (dx 2004; improved 2010; Actonel stopped 2010 after six years of Rx)     sleep apnea (moderate, 2000)     BPH     hypothyroidism     BRAO with Hollenhorst plaque (10/00)     MI/CAD/CABG - Dr. Katrinka Blazing     LBP, DDD LS spine - ESI 2010 - Dr. Jerl Santos, Dr. Regino Schultze, referred to Genene Churn, [place]     cerebrovascular disease (60-79% left ICA) - Dr. Hart Rochester     ophth - Dr. Lovell Sheehan, [facility]     flex sig about 2003 - declines other colon cancer screening     shingles     TIA - 2012 - with carotid dz - [facility] recommended

## 2013-01-03 NOTE — ED Provider Notes (Signed)
History     CSN: [id number]  Arrival date & time 01/03/13  1319   First MD Initiated Contact with Patient 01/03/13 1349      Chief Complaint  Patient presents with  . Constipation    (Consider location/radiation/quality/duration/timing/severity/associated sxs/prior treatment) Patient is a 79 y.o. male presenting with constipation. The history is provided by the patient, the spouse and a caregiver.  Constipation    patient here with constipation x1 week along with abnormal labs. I spoke with the patient's primary care Dr., Dr. Earl Gala, and patient has hypercalcemia along with renal insufficiency to that he's been following for the past few days. He instructed the patient come to the department today because of worsening abdominal pain and constipation likely from the patient's hypercalcemia. Patient denies any fever, vomiting, bloody stools. Does note some hard stools. Use over-the-counter medications without relief. Denies any muscle twitching   Past Medical History  Diagnosis Date  . Chronic back pain   . Hypercholesteremia   . Hypothyroidism   . CAD (coronary artery disease)   . Chronic kidney disease (CKD), stage III (moderate)   . Polymyalgia rheumatica   . Left bundle branch block   . Hx of CABG   . History of nephrectomy   . Internal carotid artery stenosis   . Myocardial infarction   . CHF (congestive heart failure)   . Stroke     Past Surgical History  Procedure Laterality Date  . Coronary artery bypass graft    . Cholecystectomy    . Kidney surgery      kidney removed    Family History  Problem Relation Age of Onset  . Other Mother     [place] Flu  . Heart disease Father     History  Substance Use Topics  . Smoking status: Former Smoker    Types: Cigarettes    Quit date: 11/10/1970  . Smokeless tobacco: Not on file  . Alcohol Use: No      Review of Systems  Gastrointestinal: Positive for constipation.  All other systems reviewed and are  negative.    Allergies  Penicillins  Home Medications   Current Outpatient Rx  Name  Route  Sig  Dispense  Refill  . amLODipine (NORVASC) 2.5 MG tablet   Oral   Take 2.5 mg by mouth daily.           Marland Kitchen aspirin 325 MG tablet   Oral   Take 81 mg by mouth daily.          . calcium carbonate (OS-CAL - DOSED IN MG OF ELEMENTAL CALCIUM) 1250 MG tablet   Oral   Take 1 tablet by mouth daily.           . clopidogrel (PLAVIX) 75 MG tablet   Oral   Take 75 mg by mouth daily.           Marland Kitchen doxazosin (CARDURA) 8 MG tablet   Oral   Take 8 mg by mouth daily.           Marland Kitchen ezetimibe-simvastatin (VYTORIN) 10-20 MG per tablet   Oral   Take 1 tablet by mouth daily.           . furosemide (LASIX) 20 MG tablet   Oral   Take 20 mg by mouth 2 (two) times daily.           Marland Kitchen HYDROcodone-acetaminophen (NORCO) 5-325 MG per tablet   Oral   Take 1 tablet by mouth  every 6 (six) hours.           Marland Kitchen levothyroxine (SYNTHROID, LEVOTHROID) 50 MCG tablet   Oral   Take 50 mcg by mouth daily.           . Multiple Vitamins-Minerals (MULTIVITAMINS THER. W/MINERALS) TABS   Oral   Take 1 tablet by mouth daily.           . predniSONE (DELTASONE) 5 MG tablet   Oral   Take 5 mg by mouth daily.             BP 146/67  Pulse 77  Temp(Src) 97.8 F (36.6 C) (Oral)  Resp 20  SpO2 95%  Physical Exam  Nursing note and vitals reviewed. Constitutional: He is oriented to person, place, and time. He appears well-developed and well-nourished.  Non-toxic appearance. No distress.  HENT:  Head: Normocephalic and atraumatic.  Eyes: Conjunctivae, EOM and lids are normal. Pupils are equal, round, and reactive to light.  Neck: Normal range of motion. Neck supple. No tracheal deviation present. No mass present.  Cardiovascular: Normal rate, regular rhythm and normal heart sounds.  Exam reveals no gallop.   No murmur heard. Pulmonary/Chest: Effort normal and breath sounds normal. No stridor. No  respiratory distress. He has no decreased breath sounds. He has no wheezes. He has no rhonchi. He has no rales.  Abdominal: Soft. Normal appearance and bowel sounds are normal. He exhibits no distension. There is no tenderness. There is no rigidity, no rebound, no guarding and no CVA tenderness.  Genitourinary:  Fecal impaction noted  Musculoskeletal: Normal range of motion. He exhibits no edema and no tenderness.  Neurological: He is alert and oriented to person, place, and time. He has normal strength. No cranial nerve deficit or sensory deficit. GCS eye subscore is 4. GCS verbal subscore is 5. GCS motor subscore is 6.  Skin: Skin is warm and dry. No abrasion and no rash noted.  Psychiatric: He has a normal mood and affect. His speech is normal and behavior is normal.    ED Course  Procedures (including critical care time)  Labs Reviewed  URINE CULTURE  URINALYSIS, ROUTINE W REFLEX MICROSCOPIC  CBC WITH DIFFERENTIAL  COMPREHENSIVE METABOLIC PANEL  LIPASE, BLOOD   No results found.   No diagnosis found.    MDM  Labs are pending at this time. We'll speak with [facility] was the result for patient to be admitted        Toy Baker, MD 01/03/13 1404

## 2013-01-03 NOTE — Progress Notes (Signed)
NURSING PROGRESS NOTE  Barry Wright [id number] Admission Data: 01/03/2013 6:24 PM Attending Provider: Richarda Overlie, MD JXB:[id number],AOZHY [name], MD Code Status: full  Barry Wright is a 80 y.o. male patient admitted from ED  No acute distress noted.  No c/o shortness of breath, no c/o chest pain.  Cardiac tele # [phone], in place, cardiac monitor yields: 1st degree HB.  Blood pressure 138/60, pulse 78, temperature 98.3 F (36.8 C), temperature source Oral, resp. rate 18, SpO2 96.00%.   IV Fluids:  IV in place, occlusive dsg intact without redness, IV cath forearm right, condition patent and no redness normal saline with 40K @ 75.   Allergies:  Penicillins  Past Medical History:   has a past medical history of Chronic back pain; Hypercholesteremia; Hypothyroidism; CAD (coronary artery disease); Chronic kidney disease (CKD), stage III (moderate); Polymyalgia rheumatica; Left bundle branch block; CABG; History of nephrectomy; Internal carotid artery stenosis; Myocardial infarction; CHF (congestive heart failure); and Stroke.  Past Surgical History:   has past surgical history that includes Coronary artery bypass graft; Cholecystectomy; and Kidney surgery.  Social History:   reports that he quit smoking about 42 years ago. His smoking use included Cigarettes. He smoked 0.00 packs per day. He does not have any smokeless tobacco history on file. He reports that he does not drink alcohol.  Skin: skin tear to right hand, bruise with raised area to left calf.   SR up x 2, fall assessment complete, with patient able to verbalize understanding of risk associated with falls, and verbalized understanding to call for assistance before getting out of bed.   Call light within reach. Patient able to voice and demonstrate understanding of unit orientation instructions.   Will continue to evaluate and treat per MD orders.  Rosalie Doctor, RN

## 2013-01-03 NOTE — ED Notes (Signed)
BIB [facility]. From home for recurrent constipation. Steady decline in ADL per home health. Patient has 24 hour home health care

## 2013-01-04 ENCOUNTER — Encounter (HOSPITAL_COMMUNITY): Payer: Self-pay | Admitting: General Practice

## 2013-01-04 DIAGNOSIS — Z9181 History of falling: Secondary | ICD-10-CM | POA: Diagnosis not present

## 2013-01-04 DIAGNOSIS — K5900 Constipation, unspecified: Secondary | ICD-10-CM | POA: Diagnosis present

## 2013-01-04 DIAGNOSIS — E876 Hypokalemia: Secondary | ICD-10-CM | POA: Diagnosis present

## 2013-01-04 DIAGNOSIS — R5381 Other malaise: Secondary | ICD-10-CM | POA: Diagnosis not present

## 2013-01-04 LAB — COMPREHENSIVE METABOLIC PANEL
Albumin: 2.8 g/dL — ABNORMAL LOW (ref 3.5–5.2)
Alkaline Phosphatase: 57 U/L (ref 39–117)
BUN: 31 mg/dL — ABNORMAL HIGH (ref 6–23)
Creatinine, Ser: 2.03 mg/dL — ABNORMAL HIGH (ref 0.50–1.35)
GFR calc Af Amer: 32 mL/min — ABNORMAL LOW (ref >90)
Glucose, Bld: 140 mg/dL — ABNORMAL HIGH (ref 70–99)
Total Protein: 5.5 g/dL — ABNORMAL LOW (ref 6.0–8.3)

## 2013-01-04 LAB — PTH, INTACT AND CALCIUM: PTH: 19.1 pg/mL (ref 14.0–72.0)

## 2013-01-04 LAB — CBC
Hemoglobin: 11 g/dL — ABNORMAL LOW (ref 13.0–17.0)
MCH: 32.4 pg (ref 26.0–34.0)
RBC: 3.39 MIL/uL — ABNORMAL LOW (ref 4.22–5.81)

## 2013-01-04 LAB — URINE CULTURE: Colony Count: NO GROWTH

## 2013-01-04 LAB — TROPONIN I: Troponin I: <0.3 ng/mL (ref <0.30)

## 2013-01-04 MED ORDER — BIOTENE DRY MOUTH MT LIQD
15.0000 mL | Freq: Two times a day (BID) | OROMUCOSAL | Status: DC
  Administered 2013-01-04 – 2013-01-07 (×7): 15 mL via OROMUCOSAL

## 2013-01-04 NOTE — Progress Notes (Signed)
Assessment/Plan: Active Problems:   Weakness - patient with proximal muscle weakness that led to inability to get out of chair and several falls. See hx in my 2/24 note. He is feeling better. Diff dx of muscle weakness would include hypercalcemia and steroid myopathy. He was mildly confused in the last few weeks as well.    Hypercalcemia - Ca was 11.5 last week but only 10.6 on recheck and 9.4 on admit. However, PTH was low in my office indicating that there is some degree of true hypercalcemia. With low PTH, diff dx of this would be alendronate SE and malignancy. Will check SPEP. Consider CT abd to rule out lymphoma. However, acute on CKD precludes contrast at this point.    Chronic kidney disease (CKD), stage III (moderate)   Acute on chronic kidney disease, stage 3 - suspect this is due to dehydration secondary to hypercalcemia.    Polymyalgia rheumatica - ESR is 25 which is not significantly elevated. Continue prednisone at 10 mg daily.    Osteopenia - see comments about alendronate   Carotid artery disease   Spinal stenosis of lumbar region   Coronary artery disease   Old MI (myocardial infarction)   Hypokalemia - being repleted IV. Check labs later today   Constipation - probably due to hypercalcemia.    Subjective: Feels better. Was supposed to come to my office at 1:30 yesterday to be considered for admission but somehow EMTs brought him here.   See my note from yesterday that I filed in [place] about his past hx. In last five days, he has become increasing constipated. He had to be disimpacted in ED.   Objective:  Vital Signs: Filed Vitals:   01/03/13 1652 01/03/13 1827 01/03/13 2130 01/04/13 0522  BP: 138/60 151/71 131/61 165/64  Pulse:  78 80 73  Temp: 98.3 F (36.8 C) 98.4 F (36.9 C) 98.7 F (37.1 C) 98.2 F (36.8 C)  TempSrc: Oral Oral Oral Oral  Resp: 18 22 18 20   Height:  5' 10.5" (1.791 m)    Weight:  88 kg (194 lb 0.1 oz)    SpO2: 96% 98% 97% 94%     EXAM: Abd:  soft, nontender  Lymphatic: no palpable lymphadenopathy   Intake/Output Summary (Last 24 hours) at 01/04/13 0753 Last data filed at 01/04/13 0522  Gross per 24 hour  Intake    240 ml  Output    500 ml  Net   -260 ml    Lab Results:  Recent Labs  01/03/13 1359 01/03/13 1637 01/03/13 1955  NA 142  --   --   K 3.1*  --   --   CL 103  --   --   CO2 28  --   --   GLUCOSE 127*  --   --   BUN 27*  --   --   CREATININE 2.04* 2.04*  --   CALCIUM 9.4  --   --   MG  --  1.9  --   PHOS  --   --  3.2    Recent Labs  01/03/13 1359  AST 20  ALT 16  ALKPHOS 55  BILITOT 0.9  PROT 5.6*  ALBUMIN 2.9*    Recent Labs  01/03/13 1359  LIPASE 14    Recent Labs  01/03/13 1359 01/03/13 1637 01/04/13 0515  WBC 10.3 10.3 8.0  NEUTROABS 8.0*  --   --   HGB 12.0* 12.2* 11.0*  HCT 35.4* 36.6* 33.5*  MCV 98.1 99.2 98.8  PLT 159 169 158    Recent Labs  01/03/13 1638 01/03/13 2243 01/04/13 0515  TROPONINI <0.30 <0.30 <0.30   No components found with this basename: POCBNP,  No results found for this basename: DDIMER,  in the last 72 hours No results found for this basename: HGBA1C,  in the last 72 hours No results found for this basename: CHOL, HDL, LDLCALC, TRIG, CHOLHDL, LDLDIRECT,  in the last 72 hours  Recent Labs  01/03/13 1637  TSH 4.714*   No results found for this basename: VITAMINB12, FOLATE, FERRITIN, TIBC, IRON, RETICCTPCT,  in the last 72 hours  Studies/Results: Dg Pelvis 1-2 Views  01/03/2013  *RADIOLOGY REPORT*  Clinical Data: Larey Seat down stairs 5 days ago  PELVIS - 1-2 VIEW  Comparison: Abdominal radiograph 01/03/2039  Findings: Osseous demineralization. Hip and SI joints symmetric and preserved. No acute fracture, dislocation or bone destruction. Scattered atherosclerotic calcifications and pelvic phleboliths.  IMPRESSION: No acute osseous abnormalities.   Original Report Authenticated By: Ulyses Southward, M.D.    Dg Abd Acute W/chest  01/03/2013  *RADIOLOGY  REPORT*  Clinical Data: Weakness, pain, fall.  ACUTE ABDOMEN SERIES (ABDOMEN 2 VIEW & CHEST 1 VIEW)  Comparison: 09/09/2011  Findings: Prior CABG.  Heart is upper limits normal in size.  Lungs are clear.  No effusions.  Nonobstructive bowel gas pattern.  No free air organomegaly. Multiple calcifications in the pelvis felt represent phleboliths. Prior cholecystectomy.  Thoracolumbar scoliosis and degenerative changes.  No acute bony abnormality.  IMPRESSION: No acute cardiopulmonary disease.  No evidence of bowel obstruction or free air.   Original Report Authenticated By: Charlett Nose, M.D.    Medications: Medications administered in the last 24 hours reviewed.  Current Medication List reviewed.    LOS: 1 day   [facility] @ [facility] [phone]) 01/04/2013, 7:53 AM

## 2013-01-04 NOTE — Progress Notes (Signed)
Rehab Admissions Coordinator Note:  Patient was screened by Clois Dupes for appropriateness for an Inpatient Acute Rehab Consult.  At this time, we are recommending Skilled Nursing Facility vs [facility]. Patient at a minguard assist level with PT today. Feel he will progress to be able to go home with [facility] and his hired assistance or if not, will need short term SNF rehab.  Clois Dupes 01/04/2013, 1:49 PM  I can be reached at [phone].

## 2013-01-04 NOTE — Progress Notes (Signed)
Utilization review completed.  

## 2013-01-04 NOTE — Evaluation (Signed)
Physical Therapy Evaluation Patient Details Name: Barry Wright MRN: [id number] DOB: October 16, 1920 Today's Date: 01/04/2013 Time: 5284-1324 PT Time Calculation (min): 23 min  PT Assessment / Plan / Recommendation Clinical Impression  Patient is a 79 y/o male admitted with falls, weakness, hypercalcemia, with h/o PMR and steroid dependent.  He presents with decreased mobility with limited activity tolerance, decreased balance, LE weakness, and h/o chronic pain and will benefit from skilled PT in the acute setting to maximize  independence and allow eventual return home after rehab stay.  Feel patient and wife may push however for patient to return home.  He does have assist available 8 hours a day and has been staying on main level of home., but feel best option to have post acute inpatient rehab at either CIR or SNF.    PT Assessment  Patient needs continued PT services    Follow Up Recommendations  [facility]          Equipment Recommendations  None recommended by PT       Frequency Min 3X/week    Precautions / Restrictions Precautions Precautions: Fall   Pertinent Vitals/Pain Denies pain currently      Mobility  Bed Mobility Details for Bed Mobility Assistance: not tested due to patient up in recliner and lunch tray arriving Transfers Transfers: Sit to Stand;Stand to Sit Sit to Stand: 5: Supervision;From chair/3-in-1;With armrests Stand to Sit: 4: Min guard;With upper extremity assist;To chair/3-in-1 Details for Transfer Assistance: cues for backing up safely and reaching back to chair to sit (attempting to sit prematurely) Ambulation/Gait Ambulation/Gait Assistance: 4: Min guard Ambulation Distance (Feet): 80 Feet Assistive device: Rolling walker Ambulation/Gait Assistance Details: forward flexed and able to straighten briefly, but unable to stay straight; c/o LE fatigue with ambulation; hip external rotation and feet scuffing floor Gait Pattern: Decreased hip/knee flexion -  left;Decreased dorsiflexion - right;Shuffle;Trunk flexed;Step-through pattern        PT Diagnosis: Abnormality of gait;Generalized weakness  PT Problem List: Decreased strength;Decreased activity tolerance;Decreased balance;Decreased mobility PT Treatment Interventions: DME instruction;Gait training;Stair training;Functional mobility training;Therapeutic activities;Therapeutic exercise;Patient/family education;Balance training   PT Goals Acute Rehab PT Goals PT Goal Formulation: With patient Time For Goal Achievement: 01/18/13 Potential to Achieve Goals: Good Pt will go Supine/Side to Sit: with modified independence PT Goal: Supine/Side to Sit - Progress: Goal set today Pt will go Sit to Stand: with modified independence PT Goal: Sit to Stand - Progress: Goal set today Pt will go Stand to Sit: with modified independence PT Goal: Stand to Sit - Progress: Goal set today Pt will Stand: with modified independence;with unilateral upper extremity support;3 - 5 min PT Goal: Stand - Progress: Goal set today Pt will Ambulate: 51 - 150 feet;with rolling walker;with modified independence PT Goal: Ambulate - Progress: Goal set today Pt will Go Up / Down Stairs: Flight;with supervision;with rail(s) PT Goal: Up/Down Stairs - Progress: Goal set today  Visit Information  Last PT Received On: 01/04/13 Assistance Needed: +1    Subjective Data  Subjective: I have spinal stenosis Patient Stated Goal: To return to walking with the walker   Prior Functioning  Home Living Lives With: Spouse Available Help at Discharge: Personal care attendant Type of Home: House Home Access: Stairs to enter Secretary/administrator of Steps: 3 Entrance Stairs-Rails: Can reach both Home Layout: Two level Alternate Level Stairs-Number of Steps: flight with landing Alternate Level Stairs-Rails: Right Bathroom Shower/Tub: Tub/shower unit Home Adaptive Equipment: Walker - rolling;Grab bars in shower;Shower chair  without back;Wheelchair -  manual Additional Comments: has hired aide 8 hours a day 7 days a week for cleaning, medication help, etc Prior Function Level of Independence: Needs assistance Needs Assistance: Light Housekeeping;Meal Prep Meal Prep: Total (eats out) Light Housekeeping: Total Able to Take Stairs?: Yes Driving: Yes Comments: was using walker to get around and went out for meals with wife and climbed stairs to sleep on second floor of home, but fell about 1.5-2 weeks ago and been getting around in wheelchair since. Communication Communication: No difficulties    Cognition  Cognition Overall Cognitive Status: Appears within functional limits for tasks assessed/performed Arousal/Alertness: Awake/alert Orientation Level: Appears intact for tasks assessed Behavior During Session: [facility] for tasks performed Cognition - Other Comments: had help with medications    Extremity/Trunk Assessment Right Lower Extremity Assessment RLE ROM/Strength/Tone: Deficits RLE ROM/Strength/Tone Deficits: AROM grossly WFL, strength functionally weak wtih ambulation, not formally tested Left Lower Extremity Assessment LLE ROM/Strength/Tone: Deficits LLE ROM/Strength/Tone Deficits: AROM grossly WFL, strength functionally weak wtih ambulation, not formally tested   Balance Balance Balance Assessed: Yes Static Standing Balance Static Standing - Balance Support: Bilateral upper extremity supported Static Standing - Level of Assistance: 5: Stand by assistance Static Standing - Comment/# of Minutes: standing briefly prior to walking  End of Session PT - End of Session Equipment Utilized During Treatment: Gait belt Activity Tolerance: Patient tolerated treatment well Patient left: in chair;with family/visitor present  GP     [facility] 01/04/2013, 1:24 PM Sheran Lawless, PT [phone] 01/04/2013

## 2013-01-04 NOTE — Evaluation (Signed)
Occupational Therapy Evaluation Patient Details Name: Barry Wright MRN: [id number] DOB: [birth year] Today's Date: 01/04/2013 Time: 4098-1191 OT Time Calculation (min): 29 min  OT Assessment / Plan / Recommendation Clinical Impression  Pleasant 79 yr old male admitted secondary to recent history of falls.  Pt now needs overall min assist level for selfcare tasks and functional transfers, with use of RW.  Will benefit from acute care OT services to help increase independence.  Will need 24 hour supervision for discharge.  Will benefit from follow-up SNF for further progresison of rehab if 24 hour is not available.  HHOT if 24 hour can be arranged.    OT Assessment  Patient needs continued OT Services    Follow Up Recommendations  SNF    Barriers to Discharge Decreased caregiver support    Equipment Recommendations  Tub/shower bench       Frequency  Min 2X/week    Precautions / Restrictions Precautions Precautions: Fall Restrictions Weight Bearing Restrictions: No   Pertinent Vitals/Pain Vitals stable, no pain reported    ADL  Eating/Feeding: Simulated;Independent Where Assessed - Eating/Feeding: Edge of bed Grooming: Performed;Minimal assistance Where Assessed - Grooming: Supported standing Upper Body Bathing: Simulated;Supervision/safety Where Assessed - Upper Body Bathing: Unsupported sitting Lower Body Bathing: Simulated;Minimal assistance Where Assessed - Lower Body Bathing: Supported sit to stand Upper Body Dressing: Simulated;Set up Where Assessed - Upper Body Dressing: Unsupported sitting Lower Body Dressing: Simulated;Minimal assistance Where Assessed - Lower Body Dressing: Supported sit to stand Toilet Transfer: Performed;Minimal assistance Toilet Transfer Method: Other (comment) (ambulate with RW) Toilet Transfer Equipment: Regular height toilet;Grab bars Toileting - Clothing Manipulation and Hygiene: Performed;Moderate assistance Where Assessed - Toileting  Clothing Manipulation and Hygiene: Sit to stand from 3-in-1 or toilet Tub/Shower Transfer Method: Not assessed Equipment Used: Rolling walker Transfers/Ambulation Related to ADLs: Pt needed min assist for ambulation to the bathroom using the RW.  Demonstrates kyphotic posturiing and short step length. ADL Comments: Pt overall min assist level for selfcare tasks.      OT Diagnosis: Generalized weakness  OT Problem List: Decreased strength;Impaired balance (sitting and/or standing);Decreased knowledge of use of DME or AE;Decreased coordination OT Treatment Interventions: Self-care/ADL training;Therapeutic activities;DME and/or AE instruction;Balance training;Neuromuscular education   OT Goals Acute Rehab OT Goals OT Goal Formulation: With patient Time For Goal Achievement: 01/18/13 Potential to Achieve Goals: Good ADL Goals Pt Will Perform Grooming: with supervision;Standing at sink;Supported ADL Goal: Grooming - Progress: Goal set today Pt Will Perform Lower Body Bathing: with supervision;Sit to stand from bed;Supported ADL Goal: Lower Body Bathing - Progress: Goal set today Pt Will Perform Lower Body Dressing: with supervision;Sit to stand from bed ADL Goal: Lower Body Dressing - Progress: Goal set today Pt Will Transfer to Toilet: with supervision;3-in-1;with DME ADL Goal: Toilet Transfer - Progress: Goal set today Pt Will Perform Toileting - Clothing Manipulation: with supervision ADL Goal: Toileting - Clothing Manipulation - Progress: Progressing toward goals Pt Will Perform Toileting - Hygiene: with supervision;Sit to stand from 3-in-1/toilet ADL Goal: Toileting - Hygiene - Progress: Goal set today  Visit Information  Last OT Received On: 01/04/13 Assistance Needed: +1    Subjective Data  Subjective: I need to go to the bathroom. Patient Stated Goal: Pt wants to get stronger.   Prior Functioning     Home Living Lives With: Spouse Available Help at Discharge: Personal  care attendant Type of Home: House Home Access: Stairs to enter [employer] of Steps: 3 Entrance Stairs-Rails: Can reach both Home Layout:  Two level Alternate Level Stairs-Number of Steps: flight with landing Alternate Level Stairs-Rails: Right Bathroom Shower/Tub: Tub/shower unit Bathroom Toilet: Standard Home Adaptive Equipment: Walker - rolling;Grab bars in shower;Shower chair without back;Wheelchair - manual;Bedside commode/3-in-1 Additional Comments: has hired Engineer, production 8 hours a day 7 days a week for cleaning, medication help, etc Prior Function Level of Independence: Needs assistance Needs Assistance: Light Housekeeping;Meal Prep Meal Prep: Total (eats out) Light Housekeeping: Total Able to Take Stairs?: Yes Driving: Yes Comments: was using walker to get around and went out for meals with wife and climbed stairs to sleep on second floor of home, but fell about 1.5-2 weeks ago and been getting around in wheelchair since. Communication Communication: No difficulties Dominant Hand: Right         Vision/Perception Vision - History Baseline Vision: Bifocals Patient Visual Report: No change from baseline Vision - Assessment Eye Alignment: Within Functional Limits Vision Assessment: Vision not tested Perception Perception: Within Functional Limits Praxis Praxis: Intact   Cognition  Cognition Overall Cognitive Status: Appears within functional limits for tasks assessed/performed Arousal/Alertness: Awake/alert Orientation Level: Appears intact for tasks assessed Behavior During Session: [facility] for tasks performed Cognition - Other Comments: had help with medications    Extremity/Trunk Assessment Right Upper Extremity Assessment RUE ROM/Strength/Tone: Within functional levels RUE Sensation: WFL - Light Touch RUE Coordination: Deficits RUE Coordination Deficits: Pt reports difficulty with fastening buttons on his shirt.  Noted occasional tremor/jerking in his  UEs. Left Upper Extremity Assessment LUE ROM/Strength/Tone: Within functional levels LUE Sensation: WFL - Light Touch LUE Coordination: Deficits LUE Coordination Deficits: Pt reports difficulty with fastening buttons on his shirt.  Noted occasional tremor/jerking in his UEs. Right Lower Extremity Assessment RLE ROM/Strength/Tone: Deficits RLE ROM/Strength/Tone Deficits: AROM grossly WFL, strength functionally weak wtih ambulation, not formally tested Left Lower Extremity Assessment LLE ROM/Strength/Tone: Deficits LLE ROM/Strength/Tone Deficits: AROM grossly WFL, strength functionally weak wtih ambulation, not formally tested Trunk Assessment Trunk Assessment: Kyphotic     Mobility Bed Mobility Bed Mobility: Sit to Supine Sit to Supine: 4: Min assist;HOB flat Details for Bed Mobility Assistance: Needed slight assistance with lifting his LEs into the bed. Transfers Transfers: Sit to Stand Sit to Stand: 4: Min assist;With upper extremity assist Stand to Sit: 4: Min assist;With upper extremity assist Details for Transfer Assistance: cues for backing up safely and reaching back to chair to sit (attempting to sit prematurely)        Balance Balance Balance Assessed: Yes Static Standing Balance Static Standing - Balance Support: Right upper extremity supported;Left upper extremity supported Static Standing - Level of Assistance: 5: Stand by assistance Static Standing - Comment/# of Minutes: standing briefly prior to walking Dynamic Standing Balance Dynamic Standing - Balance Support: Right upper extremity supported;Left upper extremity supported Dynamic Standing - Level of Assistance: 4: Min assist   End of Session OT - End of Session Activity Tolerance: Patient tolerated treatment well Patient left: in bed;with call bell/phone within reach;with family/visitor present;with bed alarm set Nurse Communication: Mobility status     Lexiana Spindel OTR/L Pager number  [phone] 01/04/2013, 4:24 PM

## 2013-01-05 DIAGNOSIS — Z9181 History of falling: Secondary | ICD-10-CM | POA: Diagnosis not present

## 2013-01-05 DIAGNOSIS — R5381 Other malaise: Secondary | ICD-10-CM | POA: Diagnosis not present

## 2013-01-05 LAB — CBC
HCT: 30 % — ABNORMAL LOW (ref 39.0–52.0)
Hemoglobin: 10.2 g/dL — ABNORMAL LOW (ref 13.0–17.0)
MCH: 33.7 pg (ref 26.0–34.0)
MCHC: 34 g/dL (ref 30.0–36.0)
MCV: 99 fL (ref 78.0–100.0)
Platelets: 141 10*3/uL — ABNORMAL LOW (ref 150–400)
RBC: 3.03 MIL/uL — ABNORMAL LOW (ref 4.22–5.81)
RDW: 13.6 % (ref 11.5–15.5)
WBC: 8 10*3/uL (ref 4.0–10.5)

## 2013-01-05 LAB — COMPREHENSIVE METABOLIC PANEL
ALT: 12 U/L (ref 0–53)
AST: 17 U/L (ref 0–37)
Albumin: 2.4 g/dL — ABNORMAL LOW (ref 3.5–5.2)
Alkaline Phosphatase: 51 U/L (ref 39–117)
BUN: 31 mg/dL — ABNORMAL HIGH (ref 6–23)
CO2: 27 mEq/L (ref 19–32)
Calcium: 8.2 mg/dL — ABNORMAL LOW (ref 8.4–10.5)
Chloride: 108 mEq/L (ref 96–112)
Creatinine, Ser: 2.01 mg/dL — ABNORMAL HIGH (ref 0.50–1.35)
GFR calc Af Amer: 32 mL/min — ABNORMAL LOW (ref >90)
GFR calc non Af Amer: 28 mL/min — ABNORMAL LOW (ref >90)
Glucose, Bld: 100 mg/dL — ABNORMAL HIGH (ref 70–99)
Potassium: 3.8 mEq/L (ref 3.5–5.1)
Sodium: 142 mEq/L (ref 135–145)
Total Bilirubin: 0.7 mg/dL (ref 0.3–1.2)
Total Protein: 4.7 g/dL — ABNORMAL LOW (ref 6.0–8.3)

## 2013-01-05 NOTE — Progress Notes (Signed)
Assessment/Plan: Active Problems:   Weakness - much improved.    Hypercalcemia - Ca is actually now a bit low! High calcium likely SE to alendronate. If this were malignancy, there is no reason to think it would have improved.    Chronic kidney disease (CKD), stage III (moderate) - creatinine back to baseline   Polymyalgia rheumatica - on 10 mg prednisone now.    Osteopenia   Carotid artery disease   Spinal stenosis of lumbar region   Coronary artery disease   Old MI (myocardial infarction)  I discussed d/c plan with him. He either needs 24 hour assistance with HHPT or SNF-rehab. I actually prefer the latter. He wants to think about it. I told him we would start process for SNF-rehab but if he chose 24 hr home assistance with HHPT that would be fine. I told him that he would be ready for d/c possibly 2/27 and definitely 2/28 if no intercurrent issues.    Subjective: Feeling a lot better. Having BMs. Able to walk a long distance with PT yesterday.   Objective:  Vital Signs: Filed Vitals:   01/04/13 1400 01/04/13 1704 01/04/13 2128 01/05/13 0606  BP: 128/70 132/68 153/64 151/53  Pulse: 78 82 69 65  Temp: 98.6 F (37 C) 98 F (36.7 C) 98.1 F (36.7 C) 97.8 F (36.6 C)  TempSrc: Oral Oral Oral Oral  Resp: 20 18 17 17   Height:      Weight:   89.1 kg (196 lb 6.9 oz)   SpO2: 98% 98% 98% 97%     EXAM: alert, comfortable.    Intake/Output Summary (Last 24 hours) at 01/05/13 0751 Last data filed at 01/05/13 0729  Gross per 24 hour  Intake    480 ml  Output    625 ml  Net   -145 ml    Lab Results:  Recent Labs  01/03/13 1359 01/03/13 1637 01/03/13 1955 01/04/13 1624 01/05/13 0525  NA 142  --   --  142 142  K 3.1*  --   --  4.4 3.8  CL 103  --   --  105 108  CO2 28  --   --  27 27  GLUCOSE 127*  --   --  140* 100*  BUN 27*  --   --  31* 31*  CREATININE 2.04* 2.04*  --  2.03* 2.01*  CALCIUM 9.4  --  9.1 8.9 8.2*  MG  --  1.9  --   --   --   PHOS  --   --  3.2  --    --     Recent Labs  01/04/13 1624 01/05/13 0525  AST 20 17  ALT 14 12  ALKPHOS 57 51  BILITOT 0.9 0.7  PROT 5.5* 4.7*  ALBUMIN 2.8* 2.4*    Recent Labs  01/03/13 1359  LIPASE 14    Recent Labs  01/03/13 1359  01/04/13 0515 01/05/13 0525  WBC 10.3  < > 8.0 8.0  NEUTROABS 8.0*  --   --   --   HGB 12.0*  < > 11.0* 10.2*  HCT 35.4*  < > 33.5* 30.0*  MCV 98.1  < > 98.8 99.0  PLT 159  < > 158 141*  < > = values in this interval not displayed.  Recent Labs  01/03/13 1638 01/03/13 2243 01/04/13 0515  TROPONINI <0.30 <0.30 <0.30   No components found with this basename: POCBNP,  No results found for this basename: DDIMER,  in the last 72 hours No results found for this basename: HGBA1C,  in the last 72 hours No results found for this basename: CHOL, HDL, LDLCALC, TRIG, CHOLHDL, LDLDIRECT,  in the last 72 hours  Recent Labs  01/03/13 1637  TSH 4.714*   No results found for this basename: VITAMINB12, FOLATE, FERRITIN, TIBC, IRON, RETICCTPCT,  in the last 72 hours  Studies/Results: Dg Pelvis 1-2 Views  01/03/2013  *RADIOLOGY REPORT*  Clinical Data: Larey Seat down stairs 5 days ago  PELVIS - 1-2 VIEW  Comparison: Abdominal radiograph 01/03/2039  Findings: Osseous demineralization. Hip and SI joints symmetric and preserved. No acute fracture, dislocation or bone destruction. Scattered atherosclerotic calcifications and pelvic phleboliths.  IMPRESSION: No acute osseous abnormalities.   Original Report Authenticated By: Ulyses Southward, M.D.    Dg Abd Acute W/chest  01/03/2013  *RADIOLOGY REPORT*  Clinical Data: Weakness, pain, fall.  ACUTE ABDOMEN SERIES (ABDOMEN 2 VIEW & CHEST 1 VIEW)  Comparison: 09/09/2011  Findings: Prior CABG.  Heart is upper limits normal in size.  Lungs are clear.  No effusions.  Nonobstructive bowel gas pattern.  No free air organomegaly. Multiple calcifications in the pelvis felt represent phleboliths. Prior cholecystectomy.  Thoracolumbar scoliosis and  degenerative changes.  No acute bony abnormality.  IMPRESSION: No acute cardiopulmonary disease.  No evidence of bowel obstruction or free air.   Original Report Authenticated By: Charlett Nose, M.D.    Medications: Medications administered in the last 24 hours reviewed.  Current Medication List reviewed.    LOS: 2 days   [facility] Internal Medicine @ [facility] [phone]) 01/05/2013, 7:51 AM

## 2013-01-05 NOTE — Clinical Social Work Note (Signed)
Clinical Social Work Department BRIEF PSYCHOSOCIAL ASSESSMENT 01/05/2013  Patient:  Barry Wright, Barry Wright     Account Number:  [id number]     Admit date:  01/03/2013  Clinical Social Worker:  Delmer Islam  Date/Time:  01/05/2013 02:57 AM  Referred by:    Date Referred:  01/05/2013 Referred for  SNF Placement   Other Referral:   Interview type:  Patient Other interview type:   CSW also talked with patient's wife Corrie Dandy, and daughter Delene Ruffini [phone]). Also in the room was a hired caregiver (CNA).    PSYCHOSOCIAL DATA Living Status:  WIFE Admitted from facility:   Level of care:   Primary support name:  Lanice Shirts Primary support relationship to patient:  SPOUSE Degree of support available:   Strong support from the family.    CURRENT CONCERNS Current Concerns  Post-Acute Placement   Other Concerns:    SOCIAL WORK ASSESSMENT / PLAN CSW talked with patient, wife and daughter regarding d/c plans and PT/OT's recommendation of ST rehab home with 24 supervision. CSW also advised that attending MD is in favor of SNF for ST rehab. Patient and family also made aware that inpatient rehab did review patient's information and noted that SNF for ST rehab was more appropriate. CSW presented SNF list and explained SNF search process and that the patient/family ultimately makes the decision on which facility the patient goes to.    The wife is adamantly against a skilled facility for ST rehab and the patient would also prefer to go home. Discussion was had regarding patient going home and family expressed that patient has had 24 hr care in place before and they could arrange it again temporarily. Mr. Schueler now gets 8 hours of care per day, and his caregiver was in the room. Patient's daughter reviewed the SNF list and did not like any of the facilities listed. She was advised that the SNF list includes all skilled facilities in [facility].    CSW assured family that it was  their decision and one of CSW's roles was to assure that patient would discharge safely from the hospital, either to a facility of home.   Assessment/plan status:   Other assessment/ plan:   Information/referral to community resources:   Family given SNF list for [facility]. CSW contacted Ottie Glazier to get clarity on patient not needing CIR services. She requested that daughter be given her number to call her.    PATIENT'S/FAMILY'S RESPONSE TO PLAN OF CARE: Mrs. Leano and patient were very appreciative of CSW's visit and the information shared. Patient's daughter was also appreciative of information and requested that the family have time to talk it over. Daughter very appreciative of CSW following up with inpatient rehab and providing her with Ms. Boyette's phone number.

## 2013-01-06 DIAGNOSIS — R5381 Other malaise: Secondary | ICD-10-CM | POA: Diagnosis not present

## 2013-01-06 LAB — PROTEIN ELECTROPHORESIS, SERUM
Albumin ELP: 56.2 % (ref 55.8–66.1)
Alpha-1-Globulin: 8.2 % — ABNORMAL HIGH (ref 2.9–4.9)
Alpha-2-Globulin: 15.9 % — ABNORMAL HIGH (ref 7.1–11.8)
Beta 2: 4.9 % (ref 3.2–6.5)
Beta Globulin: 8.3 % — ABNORMAL HIGH (ref 4.7–7.2)
Gamma Globulin: 6.5 % — ABNORMAL LOW (ref 11.1–18.8)
M-Spike, %: 0.14 g/dL
Total Protein ELP: 5.5 g/dL — ABNORMAL LOW (ref 6.0–8.3)

## 2013-01-06 LAB — COMPREHENSIVE METABOLIC PANEL
ALT: 14 U/L (ref 0–53)
AST: 18 U/L (ref 0–37)
Albumin: 2.6 g/dL — ABNORMAL LOW (ref 3.5–5.2)
Alkaline Phosphatase: 53 U/L (ref 39–117)
BUN: 29 mg/dL — ABNORMAL HIGH (ref 6–23)
CO2: 24 mEq/L (ref 19–32)
Calcium: 8.2 mg/dL — ABNORMAL LOW (ref 8.4–10.5)
Chloride: 108 mEq/L (ref 96–112)
Creatinine, Ser: 1.95 mg/dL — ABNORMAL HIGH (ref 0.50–1.35)
GFR calc Af Amer: 33 mL/min — ABNORMAL LOW (ref >90)
GFR calc non Af Amer: 29 mL/min — ABNORMAL LOW (ref >90)
Glucose, Bld: 96 mg/dL (ref 70–99)
Potassium: 3.5 mEq/L (ref 3.5–5.1)
Sodium: 142 mEq/L (ref 135–145)
Total Bilirubin: 0.6 mg/dL (ref 0.3–1.2)
Total Protein: 5.1 g/dL — ABNORMAL LOW (ref 6.0–8.3)

## 2013-01-06 NOTE — Progress Notes (Signed)
Assessment/Plan: Active Problems:   Weakness - seems to be getting better. Sounds like family leaning toward 24 hr care at home with HHPT. I will talk with dtr today. If this is going to be set up, I think he may be ready to go home tomorrow. Will get him out of bed more today. I have signed FL2 just in case.    Hypercalcemia - was actually low yesterday. Recheck pending.    Chronic kidney disease (CKD), stage III (moderate)   Polymyalgia rheumatica   Osteopenia   Carotid artery disease   Spinal stenosis of lumbar region   Coronary artery disease   Old MI (myocardial infarction)   Subjective: Feels okay. No new issues. Note meeting with CSW yesterday.   Objective:  Vital Signs: Filed Vitals:   01/05/13 1316 01/05/13 1732 01/05/13 2150 01/06/13 0459  BP: 131/76 143/64 156/72 144/67  Pulse: 85 77 76 71  Temp: 97.9 F (36.6 C) 99.1 F (37.3 C) 98.7 F (37.1 C) 98.3 F (36.8 C)  TempSrc: Oral Oral Oral Oral  Resp: 18 18 18 18   Height:      Weight:      SpO2: 96% 95% 97% 97%     EXAM: Awake, alert, comfortable.    Intake/Output Summary (Last 24 hours) at 01/06/13 0655 Last data filed at 01/06/13 0501  Gross per 24 hour  Intake 381.25 ml  Output    925 ml  Net -543.75 ml    Lab Results:  Recent Labs  01/03/13 1359 01/03/13 1637 01/03/13 1955 01/04/13 1624 01/05/13 0525  NA 142  --   --  142 142  K 3.1*  --   --  4.4 3.8  CL 103  --   --  105 108  CO2 28  --   --  27 27  GLUCOSE 127*  --   --  140* 100*  BUN 27*  --   --  31* 31*  CREATININE 2.04* 2.04*  --  2.03* 2.01*  CALCIUM 9.4  --  9.1 8.9 8.2*  MG  --  1.9  --   --   --   PHOS  --   --  3.2  --   --     Recent Labs  01/04/13 1624 01/05/13 0525  AST 20 17  ALT 14 12  ALKPHOS 57 51  BILITOT 0.9 0.7  PROT 5.5* 4.7*  ALBUMIN 2.8* 2.4*    Recent Labs  01/03/13 1359  LIPASE 14    Recent Labs  01/03/13 1359  01/04/13 0515 01/05/13 0525  WBC 10.3  < > 8.0 8.0  NEUTROABS 8.0*  --   --    --   HGB 12.0*  < > 11.0* 10.2*  HCT 35.4*  < > 33.5* 30.0*  MCV 98.1  < > 98.8 99.0  PLT 159  < > 158 141*  < > = values in this interval not displayed.  Recent Labs  01/03/13 1638 01/03/13 2243 01/04/13 0515  TROPONINI <0.30 <0.30 <0.30   No components found with this basename: POCBNP,  No results found for this basename: DDIMER,  in the last 72 hours No results found for this basename: HGBA1C,  in the last 72 hours No results found for this basename: CHOL, HDL, LDLCALC, TRIG, CHOLHDL, LDLDIRECT,  in the last 72 hours  Recent Labs  01/03/13 1637  TSH 4.714*   No results found for this basename: VITAMINB12, FOLATE, FERRITIN, TIBC, IRON, RETICCTPCT,  in the last  72 hours  Studies/Results: No results found. Medications: Medications administered in the last 24 hours reviewed.  Current Medication List reviewed.    LOS: 3 days   [facility] Internal Medicine @ [facility] [phone]) 01/06/2013, 6:55 AM

## 2013-01-06 NOTE — Progress Notes (Signed)
Physical Therapy Treatment Patient Details Name: Barry Wright MRN: [id number] DOB: 1919/11/27 Today's Date: 01/06/2013 Time: 1015-1050 PT Time Calculation (min): 35 min  PT Assessment / Plan / Recommendation Comments on Treatment Session  Patient able to start attempting to negotiate steps.  Still feel safest on main floor of home for now unless has assist on steps.  Feel better to walk in home rather than use wheelchair to assist with LE strength and endurance as long as he does get that 24 hour assist.    Follow Up Recommendations  Supervision/Assistance - 24 hour;Home health PT (denied for CIR and refusing SNF)           Equipment Recommendations  None recommended by PT       Frequency Min 3X/week   Plan Discharge plan needs to be updated    Precautions / Restrictions Precautions Precautions: Fall Restrictions Weight Bearing Restrictions: No   Pertinent Vitals/Pain Denies pain    Mobility  Bed Mobility Bed Mobility: Left Sidelying to Sit;Rolling Left Rolling Left: 5: Supervision;With rail Left Sidelying to Sit: 5: Supervision;With rails Details for Bed Mobility Assistance: increased time and cues for tecnhique Transfers Sit to Stand: 4: Min guard;From bed;With upper extremity assist Stand to Sit: 4: Min guard;To chair/3-in-1;With armrests Details for Transfer Assistance: cues for backing up all the way to chair and reaching back to sit due to fatigue at end of walk and rushing to sit Ambulation/Gait Ambulation/Gait Assistance: 4: Min guard Ambulation Distance (Feet): 80 Feet (x2 and 60' x 1) Assistive device: Rolling walker Ambulation/Gait Assistance Details: cues for proximity to walker; upright posture, safety due to risk for falling with increased trunk flexion and c/o LE fatigue (like neurogenic claudication) Gait Pattern: Step-through pattern;Decreased hip/knee flexion - left;Decreased hip/knee flexion - right;Shuffle;Trunk flexed;Wide base of support;Decreased  trunk rotation;Decreased dorsiflexion - right;Decreased dorsiflexion - left;Decreased stride length Stairs: Yes Stairs Assistance: 4: Min assist Stairs Assistance Details (indicate cue type and reason): alternating pattern with two rails and forwards to ascend; sideways with one rail and step to pattern to descend with pt admitting to fear Stair Management Technique: Two rails;One rail Right;Forwards;Sideways;Step to pattern;Alternating pattern Number of Stairs: 3          PT Goals Acute Rehab PT Goals Pt will go Supine/Side to Sit: with modified independence PT Goal: Supine/Side to Sit - Progress: Progressing toward goal Pt will go Sit to Stand: with modified independence PT Goal: Sit to Stand - Progress: Progressing toward goal Pt will go Stand to Sit: with modified independence PT Goal: Stand to Sit - Progress: Progressing toward goal Pt will Stand: with modified independence;with unilateral upper extremity support;3 - 5 min PT Goal: Stand - Progress: Progressing toward goal Pt will Ambulate: 51 - 150 feet;with rolling walker;with modified independence PT Goal: Ambulate - Progress: Progressing toward goal Pt will Go Up / Down Stairs: Flight;with supervision;with rail(s) PT Goal: Up/Down Stairs - Progress: Progressing toward goal  Visit Information  Last PT Received On: 01/06/13    Subjective Data  Subjective: I feel better; supposed to go home tomorrow.   Cognition  Cognition Overall Cognitive Status: Appears within functional limits for tasks assessed/performed Arousal/Alertness: Awake/alert Orientation Level: Appears intact for tasks assessed Behavior During Session: [facility] for tasks performed Cognition - Other Comments: admits to needing help with decision making re: home vs rehab    Balance  Static Standing Balance Static Standing - Balance Support: Bilateral upper extremity supported Static Standing - Level of Assistance: 5: Stand  by assistance Static Standing -  Comment/# of Minutes: few moments prior to walking  End of Session PT - End of Session Equipment Utilized During Treatment: Gait belt Activity Tolerance: Patient tolerated treatment well Patient left: in chair;with call bell/phone within reach   GP     [facility] 01/06/2013, 11:37 AM Sheran Lawless, PT [phone] 01/06/2013

## 2013-01-07 DIAGNOSIS — R778 Other specified abnormalities of plasma proteins: Secondary | ICD-10-CM | POA: Diagnosis not present

## 2013-01-07 NOTE — Progress Notes (Signed)
   CARE MANAGEMENT NOTE 01/07/2013  Patient:  Barry Wright, Barry Wright   Account Number:  [id number]  Date Initiated:  01/04/2013  Documentation initiated by:  Darlyne Russian  Subjective/Objective Assessment:   Patient admitted after frequent falls, stays in wheelchair.     Action/Plan:   Progression of care and discharge planning   Anticipated DC Date:  01/07/2013   Anticipated DC Plan:  HOME W HOME HEALTH SERVICES      DC Planning Services  CM consult      Choice offered to / List presented to:             Status of service:  In process, will continue to follow Medicare Important Message given?   (If response is "NO", the following Medicare IM given date fields will be blank) Date Medicare IM given:   Date Additional Medicare IM given:    Discharge Disposition:    Per UR Regulation:    If discussed at Long Length of Stay Meetings, dates discussed:    Comments:  01/07/2013 [address] [place], [place]  [phone] CM: Home Health PT         DME:  walker  Spoke with patient regarding selection of home health agency. Referred NCM to speak with daughter  Barry Wright  Cell: [phone]   Home: [phone]  Call to Barry Wright: Cell  [phone]  left vm message  DME: patient already has walker

## 2013-01-07 NOTE — Progress Notes (Signed)
Discussed discharge instructions and medications with pt. Pt showed no barriers to discharge. IV removed. Assessment unchanged from morning. Pt awaiting ride home with wife and caregiver. Pt will have 24/7 assistance until his follow-up appointment with Dr. Earl Gala. Case management will set up home health PT for pt prior to discharge.

## 2013-01-07 NOTE — Progress Notes (Signed)
Spoke with patient's daughter Delene Ruffini regarding home health referral She noted the patient had been set up with [facility] prior to admission   [facility] called with referral for [facility] PT

## 2013-01-07 NOTE — Discharge Summary (Signed)
Physician Discharge Summary  NAME:Barry Wright  YNW:[id number]  DOB: [birth year]   Admit date: 01/03/2013 Discharge date: 01/07/2013  Admitting Diagnosis: weakness and hypercalcemia  Discharge Diagnoses:  Active Hospital Problems   Diagnosis Date Noted  . Weakness - due to hypercalcemia 01/03/2013  . Hypercalcemia - due to alendronate vs elevated monoclonal protein (see below) 01/03/2013  . Abnormal serum protein electrophoresis - M spike present - needs quantitative immunoglobulins in office 01/07/2013  . Chronic kidney disease (CKD), stage III (moderate) - back to baseilne 01/03/2013  . Polymyalgia rheumatica - stable on 10 mg of prednisone 01/03/2013  . Osteopenia 01/03/2013  . Carotid artery disease 01/03/2013  . Spinal stenosis of lumbar region 01/03/2013  . Coronary artery disease 01/03/2013  . Old MI (myocardial infarction) 01/03/2013    Resolved Hospital Problems   Diagnosis Date Noted Date Resolved  . Hypokalemia 01/04/2013 01/05/2013  . Unspecified constipation 01/04/2013 01/05/2013  . Acute on chronic kidney disease, stage 3 01/03/2013 01/05/2013    Discharge Condition: improved  Hospital Course: patient admitted with weakness, dehydration, constipation and MS changes related to high calcium. Treated with IVF and no specific calcium lowering therapy. Calcium dropped promptly and even below normal due to low albumin levels.   Evaluation for malignancy included CXR that was negative and SPEP which shows a restricted band. Further tests to be done as outpatient but resolution of hypercalcemia seems to indicate that this was more related to alendronate than myeloma.   Consults: none.   Disposition: 06-Home-Health Care Svc  Discharge Orders   Future Orders Complete By Expires     Diet - low sodium heart healthy  As directed     Discharge instructions  As directed     Comments:      Continue with 24 hours assistance until we jointly decide that it is not necessaryl.     Increase activity slowly  As directed     Walk with assistance  As directed     Walker   As directed         Medication List    TAKE these medications       acetaminophen 500 MG tablet  Commonly known as:  TYLENOL  Take 1,000 mg by mouth every 8 (eight) hours as needed for pain.     amLODipine 2.5 MG tablet  Commonly known as:  NORVASC  Take 2.5 mg by mouth daily.     aspirin 81 MG tablet  Take 81 mg by mouth daily.     atorvastatin 20 MG tablet  Commonly known as:  LIPITOR  Take 20 mg by mouth daily.     clopidogrel 75 MG tablet  Commonly known as:  PLAVIX  Take 75 mg by mouth daily.     cycloSPORINE 0.05 % ophthalmic emulsion  Commonly known as:  RESTASIS  Place 1 drop into both eyes 2 (two) times daily.     doxazosin 4 MG tablet  Commonly known as:  CARDURA  Take 4 mg by mouth at bedtime.     levothyroxine 75 MCG tablet  Commonly known as:  SYNTHROID, LEVOTHROID  Take 75 mcg by mouth daily.     multivitamins ther. w/minerals Tabs  Take 1 tablet by mouth daily.     predniSONE 10 MG tablet  Commonly known as:  DELTASONE  Take 10 mg by mouth daily.     traMADol 50 MG tablet  Commonly known as:  ULTRAM  Take 50 mg by mouth 2 (two) times  daily.         Things to follow up in the outpatient setting: obtain IFE.   Time coordinating discharge: 30 minutes including medication reconciliation, preparation of discharge papers, and discussion with patient and family    Signed: Darnelle Bos 01/07/2013, 8:24 AM

## 2013-01-17 DIAGNOSIS — R799 Abnormal finding of blood chemistry, unspecified: Secondary | ICD-10-CM | POA: Diagnosis not present

## 2013-02-05 DIAGNOSIS — R799 Abnormal finding of blood chemistry, unspecified: Secondary | ICD-10-CM | POA: Diagnosis not present

## 2013-03-04 DIAGNOSIS — R32 Unspecified urinary incontinence: Secondary | ICD-10-CM | POA: Diagnosis not present

## 2013-03-04 DIAGNOSIS — Z7982 Long term (current) use of aspirin: Secondary | ICD-10-CM | POA: Diagnosis not present

## 2013-03-04 DIAGNOSIS — Z9181 History of falling: Secondary | ICD-10-CM | POA: Diagnosis not present

## 2013-03-04 DIAGNOSIS — M6281 Muscle weakness (generalized): Secondary | ICD-10-CM | POA: Diagnosis not present

## 2013-03-04 DIAGNOSIS — IMO0001 Reserved for inherently not codable concepts without codable children: Secondary | ICD-10-CM | POA: Diagnosis not present

## 2013-03-07 DIAGNOSIS — Z7982 Long term (current) use of aspirin: Secondary | ICD-10-CM | POA: Diagnosis not present

## 2013-03-07 DIAGNOSIS — R32 Unspecified urinary incontinence: Secondary | ICD-10-CM | POA: Diagnosis not present

## 2013-03-07 DIAGNOSIS — Z9181 History of falling: Secondary | ICD-10-CM | POA: Diagnosis not present

## 2013-03-07 DIAGNOSIS — M6281 Muscle weakness (generalized): Secondary | ICD-10-CM | POA: Diagnosis not present

## 2013-03-07 DIAGNOSIS — IMO0001 Reserved for inherently not codable concepts without codable children: Secondary | ICD-10-CM | POA: Diagnosis not present

## 2013-03-09 DIAGNOSIS — Z9181 History of falling: Secondary | ICD-10-CM | POA: Diagnosis not present

## 2013-03-09 DIAGNOSIS — R32 Unspecified urinary incontinence: Secondary | ICD-10-CM | POA: Diagnosis not present

## 2013-03-09 DIAGNOSIS — M6281 Muscle weakness (generalized): Secondary | ICD-10-CM | POA: Diagnosis not present

## 2013-03-09 DIAGNOSIS — IMO0001 Reserved for inherently not codable concepts without codable children: Secondary | ICD-10-CM | POA: Diagnosis not present

## 2013-03-09 DIAGNOSIS — Z7982 Long term (current) use of aspirin: Secondary | ICD-10-CM | POA: Diagnosis not present

## 2013-03-11 DIAGNOSIS — Z7982 Long term (current) use of aspirin: Secondary | ICD-10-CM | POA: Diagnosis not present

## 2013-03-11 DIAGNOSIS — R32 Unspecified urinary incontinence: Secondary | ICD-10-CM | POA: Diagnosis not present

## 2013-03-11 DIAGNOSIS — Z9181 History of falling: Secondary | ICD-10-CM | POA: Diagnosis not present

## 2013-03-11 DIAGNOSIS — M6281 Muscle weakness (generalized): Secondary | ICD-10-CM | POA: Diagnosis not present

## 2013-03-11 DIAGNOSIS — IMO0001 Reserved for inherently not codable concepts without codable children: Secondary | ICD-10-CM | POA: Diagnosis not present

## 2013-03-14 DIAGNOSIS — M1990 Unspecified osteoarthritis, unspecified site: Secondary | ICD-10-CM | POA: Diagnosis not present

## 2013-03-14 DIAGNOSIS — M545 Low back pain: Secondary | ICD-10-CM | POA: Diagnosis not present

## 2013-03-14 DIAGNOSIS — M949 Disorder of cartilage, unspecified: Secondary | ICD-10-CM | POA: Diagnosis not present

## 2013-03-14 DIAGNOSIS — M353 Polymyalgia rheumatica: Secondary | ICD-10-CM | POA: Diagnosis not present

## 2013-03-14 DIAGNOSIS — IMO0002 Reserved for concepts with insufficient information to code with codable children: Secondary | ICD-10-CM | POA: Diagnosis not present

## 2013-03-14 DIAGNOSIS — R609 Edema, unspecified: Secondary | ICD-10-CM | POA: Diagnosis not present

## 2013-03-15 DIAGNOSIS — Z7982 Long term (current) use of aspirin: Secondary | ICD-10-CM | POA: Diagnosis not present

## 2013-03-15 DIAGNOSIS — M6281 Muscle weakness (generalized): Secondary | ICD-10-CM | POA: Diagnosis not present

## 2013-03-15 DIAGNOSIS — R32 Unspecified urinary incontinence: Secondary | ICD-10-CM | POA: Diagnosis not present

## 2013-03-15 DIAGNOSIS — Z9181 History of falling: Secondary | ICD-10-CM | POA: Diagnosis not present

## 2013-03-15 DIAGNOSIS — IMO0001 Reserved for inherently not codable concepts without codable children: Secondary | ICD-10-CM | POA: Diagnosis not present

## 2013-03-16 DIAGNOSIS — M6281 Muscle weakness (generalized): Secondary | ICD-10-CM | POA: Diagnosis not present

## 2013-03-16 DIAGNOSIS — IMO0001 Reserved for inherently not codable concepts without codable children: Secondary | ICD-10-CM | POA: Diagnosis not present

## 2013-03-16 DIAGNOSIS — Z9181 History of falling: Secondary | ICD-10-CM | POA: Diagnosis not present

## 2013-03-16 DIAGNOSIS — Z7982 Long term (current) use of aspirin: Secondary | ICD-10-CM | POA: Diagnosis not present

## 2013-03-16 DIAGNOSIS — R32 Unspecified urinary incontinence: Secondary | ICD-10-CM | POA: Diagnosis not present

## 2013-03-18 DIAGNOSIS — Z9181 History of falling: Secondary | ICD-10-CM | POA: Diagnosis not present

## 2013-03-18 DIAGNOSIS — IMO0001 Reserved for inherently not codable concepts without codable children: Secondary | ICD-10-CM | POA: Diagnosis not present

## 2013-03-18 DIAGNOSIS — Z7982 Long term (current) use of aspirin: Secondary | ICD-10-CM | POA: Diagnosis not present

## 2013-03-18 DIAGNOSIS — M6281 Muscle weakness (generalized): Secondary | ICD-10-CM | POA: Diagnosis not present

## 2013-03-18 DIAGNOSIS — R32 Unspecified urinary incontinence: Secondary | ICD-10-CM | POA: Diagnosis not present

## 2013-03-22 DIAGNOSIS — R32 Unspecified urinary incontinence: Secondary | ICD-10-CM | POA: Diagnosis not present

## 2013-03-22 DIAGNOSIS — IMO0001 Reserved for inherently not codable concepts without codable children: Secondary | ICD-10-CM | POA: Diagnosis not present

## 2013-03-22 DIAGNOSIS — Z9181 History of falling: Secondary | ICD-10-CM | POA: Diagnosis not present

## 2013-03-22 DIAGNOSIS — M6281 Muscle weakness (generalized): Secondary | ICD-10-CM | POA: Diagnosis not present

## 2013-03-22 DIAGNOSIS — Z7982 Long term (current) use of aspirin: Secondary | ICD-10-CM | POA: Diagnosis not present

## 2013-03-25 DIAGNOSIS — R32 Unspecified urinary incontinence: Secondary | ICD-10-CM | POA: Diagnosis not present

## 2013-03-25 DIAGNOSIS — IMO0001 Reserved for inherently not codable concepts without codable children: Secondary | ICD-10-CM | POA: Diagnosis not present

## 2013-03-25 DIAGNOSIS — Z7982 Long term (current) use of aspirin: Secondary | ICD-10-CM | POA: Diagnosis not present

## 2013-03-25 DIAGNOSIS — M6281 Muscle weakness (generalized): Secondary | ICD-10-CM | POA: Diagnosis not present

## 2013-03-25 DIAGNOSIS — Z9181 History of falling: Secondary | ICD-10-CM | POA: Diagnosis not present

## 2013-03-28 DIAGNOSIS — Z9181 History of falling: Secondary | ICD-10-CM | POA: Diagnosis not present

## 2013-03-28 DIAGNOSIS — Z7982 Long term (current) use of aspirin: Secondary | ICD-10-CM | POA: Diagnosis not present

## 2013-03-28 DIAGNOSIS — M6281 Muscle weakness (generalized): Secondary | ICD-10-CM | POA: Diagnosis not present

## 2013-03-28 DIAGNOSIS — R32 Unspecified urinary incontinence: Secondary | ICD-10-CM | POA: Diagnosis not present

## 2013-03-28 DIAGNOSIS — IMO0001 Reserved for inherently not codable concepts without codable children: Secondary | ICD-10-CM | POA: Diagnosis not present

## 2013-03-31 DIAGNOSIS — M6281 Muscle weakness (generalized): Secondary | ICD-10-CM | POA: Diagnosis not present

## 2013-03-31 DIAGNOSIS — Z9181 History of falling: Secondary | ICD-10-CM | POA: Diagnosis not present

## 2013-03-31 DIAGNOSIS — IMO0001 Reserved for inherently not codable concepts without codable children: Secondary | ICD-10-CM | POA: Diagnosis not present

## 2013-03-31 DIAGNOSIS — Z7982 Long term (current) use of aspirin: Secondary | ICD-10-CM | POA: Diagnosis not present

## 2013-03-31 DIAGNOSIS — R32 Unspecified urinary incontinence: Secondary | ICD-10-CM | POA: Diagnosis not present

## 2013-05-17 DIAGNOSIS — D472 Monoclonal gammopathy: Secondary | ICD-10-CM | POA: Diagnosis not present

## 2013-05-17 DIAGNOSIS — I10 Essential (primary) hypertension: Secondary | ICD-10-CM | POA: Diagnosis not present

## 2013-05-17 DIAGNOSIS — R609 Edema, unspecified: Secondary | ICD-10-CM | POA: Diagnosis not present

## 2013-05-17 DIAGNOSIS — M545 Low back pain, unspecified: Secondary | ICD-10-CM | POA: Diagnosis not present

## 2013-05-17 DIAGNOSIS — N183 Chronic kidney disease, stage 3 unspecified: Secondary | ICD-10-CM | POA: Diagnosis not present

## 2013-06-01 DIAGNOSIS — M1990 Unspecified osteoarthritis, unspecified site: Secondary | ICD-10-CM | POA: Diagnosis not present

## 2013-06-01 DIAGNOSIS — M899 Disorder of bone, unspecified: Secondary | ICD-10-CM | POA: Diagnosis not present

## 2013-06-01 DIAGNOSIS — M353 Polymyalgia rheumatica: Secondary | ICD-10-CM | POA: Diagnosis not present

## 2013-06-01 DIAGNOSIS — M545 Low back pain: Secondary | ICD-10-CM | POA: Diagnosis not present

## 2013-06-15 DIAGNOSIS — I2510 Atherosclerotic heart disease of native coronary artery without angina pectoris: Secondary | ICD-10-CM | POA: Diagnosis not present

## 2013-06-15 DIAGNOSIS — I10 Essential (primary) hypertension: Secondary | ICD-10-CM | POA: Diagnosis not present

## 2013-06-15 DIAGNOSIS — R609 Edema, unspecified: Secondary | ICD-10-CM | POA: Diagnosis not present

## 2013-08-03 DIAGNOSIS — M545 Low back pain: Secondary | ICD-10-CM | POA: Diagnosis not present

## 2013-08-03 DIAGNOSIS — M353 Polymyalgia rheumatica: Secondary | ICD-10-CM | POA: Diagnosis not present

## 2013-08-03 DIAGNOSIS — M1990 Unspecified osteoarthritis, unspecified site: Secondary | ICD-10-CM | POA: Diagnosis not present

## 2013-08-03 DIAGNOSIS — IMO0002 Reserved for concepts with insufficient information to code with codable children: Secondary | ICD-10-CM | POA: Diagnosis not present

## 2013-08-17 DIAGNOSIS — I10 Essential (primary) hypertension: Secondary | ICD-10-CM | POA: Diagnosis not present

## 2013-08-17 DIAGNOSIS — L299 Pruritus, unspecified: Secondary | ICD-10-CM | POA: Diagnosis not present

## 2013-08-17 DIAGNOSIS — N183 Chronic kidney disease, stage 3 unspecified: Secondary | ICD-10-CM | POA: Diagnosis not present

## 2013-08-17 DIAGNOSIS — R109 Unspecified abdominal pain: Secondary | ICD-10-CM | POA: Diagnosis not present

## 2013-08-29 DIAGNOSIS — Z23 Encounter for immunization: Secondary | ICD-10-CM | POA: Diagnosis not present

## 2013-09-02 DIAGNOSIS — L538 Other specified erythematous conditions: Secondary | ICD-10-CM | POA: Diagnosis not present

## 2013-09-16 DIAGNOSIS — L219 Seborrheic dermatitis, unspecified: Secondary | ICD-10-CM | POA: Diagnosis not present

## 2013-09-16 DIAGNOSIS — D485 Neoplasm of uncertain behavior of skin: Secondary | ICD-10-CM | POA: Diagnosis not present

## 2013-10-11 DIAGNOSIS — M1990 Unspecified osteoarthritis, unspecified site: Secondary | ICD-10-CM | POA: Diagnosis not present

## 2013-10-11 DIAGNOSIS — IMO0002 Reserved for concepts with insufficient information to code with codable children: Secondary | ICD-10-CM | POA: Diagnosis not present

## 2013-10-11 DIAGNOSIS — M353 Polymyalgia rheumatica: Secondary | ICD-10-CM | POA: Diagnosis not present

## 2013-10-11 DIAGNOSIS — M545 Low back pain: Secondary | ICD-10-CM | POA: Diagnosis not present

## 2013-11-16 DIAGNOSIS — Z79899 Other long term (current) drug therapy: Secondary | ICD-10-CM | POA: Diagnosis not present

## 2013-11-16 DIAGNOSIS — M2550 Pain in unspecified joint: Secondary | ICD-10-CM | POA: Diagnosis not present

## 2013-11-16 DIAGNOSIS — M5137 Other intervertebral disc degeneration, lumbosacral region: Secondary | ICD-10-CM | POA: Diagnosis not present

## 2013-11-16 DIAGNOSIS — G894 Chronic pain syndrome: Secondary | ICD-10-CM | POA: Diagnosis not present

## 2013-11-23 DIAGNOSIS — N183 Chronic kidney disease, stage 3 unspecified: Secondary | ICD-10-CM | POA: Diagnosis not present

## 2013-11-23 DIAGNOSIS — M545 Low back pain, unspecified: Secondary | ICD-10-CM | POA: Diagnosis not present

## 2013-11-23 DIAGNOSIS — I10 Essential (primary) hypertension: Secondary | ICD-10-CM | POA: Diagnosis not present

## 2013-12-08 DIAGNOSIS — Z79899 Other long term (current) drug therapy: Secondary | ICD-10-CM | POA: Diagnosis not present

## 2013-12-08 DIAGNOSIS — M5137 Other intervertebral disc degeneration, lumbosacral region: Secondary | ICD-10-CM | POA: Diagnosis not present

## 2013-12-08 DIAGNOSIS — M353 Polymyalgia rheumatica: Secondary | ICD-10-CM | POA: Diagnosis not present

## 2013-12-08 DIAGNOSIS — G894 Chronic pain syndrome: Secondary | ICD-10-CM | POA: Diagnosis not present

## 2013-12-15 DIAGNOSIS — M545 Low back pain, unspecified: Secondary | ICD-10-CM | POA: Diagnosis not present

## 2013-12-15 DIAGNOSIS — IMO0002 Reserved for concepts with insufficient information to code with codable children: Secondary | ICD-10-CM | POA: Diagnosis not present

## 2013-12-15 DIAGNOSIS — M353 Polymyalgia rheumatica: Secondary | ICD-10-CM | POA: Diagnosis not present

## 2013-12-15 DIAGNOSIS — M1990 Unspecified osteoarthritis, unspecified site: Secondary | ICD-10-CM | POA: Diagnosis not present

## 2014-01-01 ENCOUNTER — Encounter (HOSPITAL_COMMUNITY): Payer: Self-pay | Admitting: Emergency Medicine

## 2014-01-01 ENCOUNTER — Emergency Department (HOSPITAL_COMMUNITY)
Admission: EM | Admit: 2014-01-01 | Discharge: 2014-01-01 | Disposition: A | Discharge status: Home / Self Care | Payer: Medicare Other | Attending: Emergency Medicine | Admitting: Emergency Medicine

## 2014-01-01 DIAGNOSIS — Z7902 Long term (current) use of antithrombotics/antiplatelets: Secondary | ICD-10-CM | POA: Insufficient documentation

## 2014-01-01 DIAGNOSIS — E039 Hypothyroidism, unspecified: Secondary | ICD-10-CM | POA: Diagnosis not present

## 2014-01-01 DIAGNOSIS — K5900 Constipation, unspecified: Secondary | ICD-10-CM | POA: Diagnosis not present

## 2014-01-01 DIAGNOSIS — Z880 Allergy status to penicillin: Secondary | ICD-10-CM | POA: Diagnosis not present

## 2014-01-01 DIAGNOSIS — I129 Hypertensive chronic kidney disease with stage 1 through stage 4 chronic kidney disease, or unspecified chronic kidney disease: Secondary | ICD-10-CM | POA: Insufficient documentation

## 2014-01-01 DIAGNOSIS — M353 Polymyalgia rheumatica: Secondary | ICD-10-CM | POA: Insufficient documentation

## 2014-01-01 DIAGNOSIS — I509 Heart failure, unspecified: Secondary | ICD-10-CM | POA: Insufficient documentation

## 2014-01-01 DIAGNOSIS — R6889 Other general symptoms and signs: Secondary | ICD-10-CM | POA: Diagnosis not present

## 2014-01-01 DIAGNOSIS — Z8673 Personal history of transient ischemic attack (TIA), and cerebral infarction without residual deficits: Secondary | ICD-10-CM | POA: Diagnosis not present

## 2014-01-01 DIAGNOSIS — Z951 Presence of aortocoronary bypass graft: Secondary | ICD-10-CM | POA: Insufficient documentation

## 2014-01-01 DIAGNOSIS — I252 Old myocardial infarction: Secondary | ICD-10-CM | POA: Insufficient documentation

## 2014-01-01 DIAGNOSIS — E7800 Pure hypercholesterolemia, unspecified: Secondary | ICD-10-CM | POA: Insufficient documentation

## 2014-01-01 DIAGNOSIS — IMO0002 Reserved for concepts with insufficient information to code with codable children: Secondary | ICD-10-CM | POA: Insufficient documentation

## 2014-01-01 DIAGNOSIS — R350 Frequency of micturition: Secondary | ICD-10-CM | POA: Insufficient documentation

## 2014-01-01 DIAGNOSIS — G8929 Other chronic pain: Secondary | ICD-10-CM | POA: Insufficient documentation

## 2014-01-01 DIAGNOSIS — I2510 Atherosclerotic heart disease of native coronary artery without angina pectoris: Secondary | ICD-10-CM | POA: Diagnosis not present

## 2014-01-01 DIAGNOSIS — Z87891 Personal history of nicotine dependence: Secondary | ICD-10-CM | POA: Insufficient documentation

## 2014-01-01 DIAGNOSIS — Z7982 Long term (current) use of aspirin: Secondary | ICD-10-CM | POA: Diagnosis not present

## 2014-01-01 DIAGNOSIS — R339 Retention of urine, unspecified: Secondary | ICD-10-CM | POA: Diagnosis not present

## 2014-01-01 DIAGNOSIS — N183 Chronic kidney disease, stage 3 unspecified: Secondary | ICD-10-CM | POA: Insufficient documentation

## 2014-01-01 DIAGNOSIS — Z79899 Other long term (current) drug therapy: Secondary | ICD-10-CM | POA: Insufficient documentation

## 2014-01-01 DIAGNOSIS — R300 Dysuria: Secondary | ICD-10-CM | POA: Diagnosis not present

## 2014-01-01 DIAGNOSIS — Z9089 Acquired absence of other organs: Secondary | ICD-10-CM | POA: Insufficient documentation

## 2014-01-01 NOTE — ED Notes (Signed)
SPD order sent for Soap Suds Enema

## 2014-01-01 NOTE — Discharge Instructions (Signed)
Constipation, Adult Constipation is when a person:  Poops (bowel movement) less than 3 times a week.  Has a hard time pooping.  Has poop that is dry, hard, or bigger than normal. HOME CARE   Eat more fiber, such as fruits, vegetables, whole grains like brown rice, and beans.  Eat less fatty foods and sugar. This includes [place] fries, hamburgers, cookies, candy, and soda.  If you are not getting enough fiber from food, take products with added fiber in them (supplements).  Drink enough fluid to keep your pee (urine) clear or pale yellow.  Go to the restroom when you feel like you need to poop. Do not hold it.  Only take medicine as told by your doctor. Do not take medicines that help you poop (laxatives) without talking to your doctor first.  Exercise on a regular basis, or as told by your doctor. GET HELP RIGHT AWAY IF:   You have bright red blood in your poop (stool).  Your constipation lasts more than 4 days or gets worse.  You have belly (abdomen) or butt (rectal) pain.  You have thin poop (as thin as a pencil).  You lose weight, and it cannot be explained. MAKE SURE YOU:   Understand these instructions.  Will watch your condition.  Will get help right away if you are not doing well or get worse. Document Released: 04/14/2008 Document Revised: 01/19/2012 Document Reviewed: 08/08/2013 [facility] Patient Information 2014 [place], [place].  Fiber Content in Foods Drinking plenty of fluids and consuming foods high in fiber can help with constipation. See the list below for the fiber content of some common foods. Starches and Grains / Dietary Fiber (g)  Cheerios, 1 cup / 3 g  Kellogg's Corn Flakes, 1 cup / 0.7 g  Rice Krispies, 1  cup / 0.3 g  Quaker Oat Life Cereal,  cup / 2.1 g  Oatmeal, instant (cooked),  cup / 2 g  Kellogg's Frosted Mini Wheats, 1 cup / 5.1 g  Rice, brown, long-grain (cooked), 1 cup / 3.5 g  Rice, white, long-grain (cooked), 1 cup /  0.6 g  Macaroni, cooked, enriched, 1 cup / 2.5 g Legumes / Dietary Fiber (g)  Beans, baked, canned, plain or vegetarian,  cup / 5.2 g  Beans, kidney, canned,  cup / 6.8 g  Beans, pinto, dried (cooked),  cup / 7.7 g  Beans, pinto, canned,  cup / 5.5 g Breads and Crackers / Dietary Fiber (g)  Graham crackers, plain or honey, 2 squares / 0.7 g  Saltine crackers, 3 squares / 0.3 g  Pretzels, plain, salted, 10 pieces / 1.8 g  Bread, whole-wheat, 1 slice / 1.9 g  Bread, white, 1 slice / 0.7 g  Bread, raisin, 1 slice / 1.2 g  Bagel, plain, 3 oz / 2 g  Tortilla, flour, 1 oz / 0.9 g  Tortilla, corn, 1 small / 1.5 g  Bun, hamburger or hotdog, 1 small / 0.9 g Fruits / Dietary Fiber (g)  Apple, raw with skin, 1 medium / 4.4 g  Applesauce, sweetened,  cup / 1.5 g  Banana,  medium / 1.5 g  Grapes, 10 grapes / 0.4 g  Orange, 1 small / 2.3 g  Raisin, 1.5 oz / 1.6 g  Melon, 1 cup / 1.4 g Vegetables / Dietary Fiber (g)  Green beans, canned,  cup / 1.3 g  Carrots (cooked),  cup / 2.3 g  Broccoli (cooked),  cup / 2.8 g  Peas,  frozen (cooked),  cup / 4.4 g  Potatoes, mashed,  cup / 1.6 g  Lettuce, 1 cup / 0.5 g  Corn, canned,  cup / 1.6 g  Tomato,  cup / 1.1 g Document Released: 03/15/2007 Document Revised: 01/19/2012 Document Reviewed: 05/10/2007 [facility] Patient Information 2014 [place], [place].

## 2014-01-01 NOTE — ED Notes (Signed)
Pt denies the use of pain medication and states that he takes miralax once a day so that he is not constipated. (pt did not take any miralax today.) pt states that he has not used the bathroom for 3 days. When patient is wiped this is bowel movement, and pt had to cleaned several times, but does not feel like he can have a bowel movement. Pt today also started having problems with urination which is unusal for him. Pt urinated on himself in the EMS truck without meaning to.

## 2014-01-01 NOTE — ED Notes (Signed)
Per EMS: pt is from home. pt has been constipated for the past 3 days. Pt is having lower abdominal pain and buttocks pain from not being able to go to the bathroom.

## 2014-01-01 NOTE — ED Provider Notes (Signed)
CSN: [id number]     Arrival date & time 01/01/14  1933 History   First MD Initiated Contact with Patient 01/01/14 1956     Chief Complaint  Patient presents with  . Constipation     (Consider location/radiation/quality/duration/timing/severity/associated sxs/prior Treatment) HPI Comments: Barry Wright is a 80 year old gentleman, with a history of constipation.  He takes MiraLAX on a daily basis.  He usually has a bowel movement.  Today.  He has not had a bowel movement in 3 days.  He, feels uncomfortable.  He is having abdominal cramping for the past 2, days, intermittently.  He's tried 2 Fleet enemas without results.  He also was experiencing urinary retention with the urge to urinate frequently, but only able to pass small amounts of urine.  Denies any fever, myalgias.  Patient is a 80 y.o. male presenting with constipation. The history is provided by the patient.  Constipation Severity:  Mild Time since last bowel movement:  3 days Timing:  Intermittent Progression:  Worsening Chronicity:  Recurrent Context: narcotics   Stool description:  Hard Unusual stool frequency:  Daily Relieved by:  Nothing Worsened by:  Nothing tried Ineffective treatments:  Enemas and Miralax Associated symptoms: abdominal pain and urinary retention   Associated symptoms: no dysuria and no fever   Abdominal pain:    Location:  Generalized   Quality:  Cramping   Severity:  Mild   Onset quality:  Gradual   Duration:  2 days   Timing:  Intermittent   Progression:  Unchanged   Chronicity:  Recurrent   Past Medical History  Diagnosis Date  . Chronic back pain   . Hypercholesteremia   . Hypothyroidism   . CAD (coronary artery disease)   . Chronic kidney disease (CKD), stage III (moderate)   . Polymyalgia rheumatica   . Left bundle branch block   . Hx of CABG   . History of nephrectomy   . Internal carotid artery stenosis   . Myocardial infarction   . CHF (congestive heart failure)   . Stroke     Past Surgical History  Procedure Laterality Date  . Coronary artery bypass graft    . Cholecystectomy    . Kidney surgery      kidney removed   Family History  Problem Relation Age of Onset  . Other Mother     [place] Flu  . Heart disease Father    History  Substance Use Topics  . Smoking status: Former Smoker    Types: Cigarettes    Quit date: 11/10/1970  . Smokeless tobacco: Never Used  . Alcohol Use: No    Review of Systems  Constitutional: Negative for fever.  Respiratory: Negative for shortness of breath.   Cardiovascular: Negative for chest pain.  Gastrointestinal: Positive for abdominal pain and constipation.  Genitourinary: Positive for frequency and difficulty urinating. Negative for dysuria.  Musculoskeletal: Negative for myalgias.  All other systems reviewed and are negative.      Allergies  Penicillins  Home Medications   Current Outpatient Rx  Name  Route  Sig  Dispense  Refill  . acetaminophen (TYLENOL) 500 MG tablet   Oral   Take 1,000 mg by mouth every 8 (eight) hours as needed for pain.         Marland Kitchen amLODipine (NORVASC) 2.5 MG tablet   Oral   Take 2.5 mg by mouth daily.           Marland Kitchen aspirin 81 MG tablet   Oral  Take 81 mg by mouth daily.         Marland Kitchen atorvastatin (LIPITOR) 20 MG tablet   Oral   Take 20 mg by mouth daily.         . clopidogrel (PLAVIX) 75 MG tablet   Oral   Take 75 mg by mouth daily.           Marland Kitchen doxazosin (CARDURA) 4 MG tablet   Oral   Take 4 mg by mouth at bedtime.         . gabapentin (NEURONTIN) 100 MG capsule   Oral   Take 200 mg by mouth at bedtime.         Marland Kitchen levothyroxine (SYNTHROID, LEVOTHROID) 75 MCG tablet   Oral   Take 75 mcg by mouth daily.         . Multiple Vitamins-Minerals (MULTIVITAMINS THER. W/MINERALS) TABS   Oral   Take 1 tablet by mouth daily.           . predniSONE (DELTASONE) 5 MG tablet   Oral   Take 5 mg by mouth daily.         . traMADol (ULTRAM) 50 MG  tablet   Oral   Take 50 mg by mouth 2 (two) times daily.          BP 152/64  Pulse 91  Temp(Src) 98.1 F (36.7 C) (Oral)  Resp 20  SpO2 97% Physical Exam  Nursing note and vitals reviewed. Constitutional: He is oriented to person, place, and time. He appears well-developed and well-nourished.  HENT:  Head: Normocephalic.  Mouth/Throat: Oropharynx is clear and moist.  Eyes: Pupils are equal, round, and reactive to light.  Neck: Normal range of motion.  Cardiovascular: Normal rate and regular rhythm.   Pulmonary/Chest: Effort normal.  Abdominal: Soft. He exhibits no distension. There is no tenderness.  Genitourinary:  Large amount of formed stool in the rectal vault.  Patient has been disimpacted.  I will follow this with a soapsuds enema  Musculoskeletal: Normal range of motion.  Neurological: He is alert and oriented to person, place, and time.  Skin: Skin is warm.    ED Course  Procedures (including critical care time) Labs Review Labs Reviewed - No data to display Imaging Review No results found.  EKG Interpretation   None      patient was disimpacted for a large amount of soft to formed stool.  He was then given a soapsuds enema and additional rectal stimulation followed by a large, bowel movement, after which she was able to urinate freely, stating he, feels much, better  MDM   Final diagnoses:  Constipation         Garald Balding, NP 01/01/14 2227

## 2014-01-02 NOTE — ED Provider Notes (Signed)
Medical screening examination/treatment/procedure(s) were conducted as a shared visit with non-physician practitioner(s) and myself.  I personally evaluated the patient during the encounter.  Constipation and fecal impaction. Abdomen soft, denies any abdominal pain on my evaluation after disimpaction.  EKG Interpretation   None         Ezequiel Essex, MD 01/02/14 [phone]

## 2014-01-03 DIAGNOSIS — M353 Polymyalgia rheumatica: Secondary | ICD-10-CM | POA: Diagnosis not present

## 2014-01-03 DIAGNOSIS — G894 Chronic pain syndrome: Secondary | ICD-10-CM | POA: Diagnosis not present

## 2014-01-03 DIAGNOSIS — IMO0001 Reserved for inherently not codable concepts without codable children: Secondary | ICD-10-CM | POA: Diagnosis not present

## 2014-01-03 DIAGNOSIS — M47817 Spondylosis without myelopathy or radiculopathy, lumbosacral region: Secondary | ICD-10-CM | POA: Diagnosis not present

## 2014-01-03 DIAGNOSIS — Z79899 Other long term (current) drug therapy: Secondary | ICD-10-CM | POA: Diagnosis not present

## 2014-02-02 DIAGNOSIS — G894 Chronic pain syndrome: Secondary | ICD-10-CM | POA: Diagnosis not present

## 2014-02-02 DIAGNOSIS — M1990 Unspecified osteoarthritis, unspecified site: Secondary | ICD-10-CM | POA: Diagnosis not present

## 2014-02-02 DIAGNOSIS — IMO0001 Reserved for inherently not codable concepts without codable children: Secondary | ICD-10-CM | POA: Diagnosis not present

## 2014-02-02 DIAGNOSIS — M5137 Other intervertebral disc degeneration, lumbosacral region: Secondary | ICD-10-CM | POA: Diagnosis not present

## 2014-02-15 DIAGNOSIS — M79609 Pain in unspecified limb: Secondary | ICD-10-CM | POA: Diagnosis not present

## 2014-02-15 DIAGNOSIS — M1990 Unspecified osteoarthritis, unspecified site: Secondary | ICD-10-CM | POA: Diagnosis not present

## 2014-02-15 DIAGNOSIS — M545 Low back pain, unspecified: Secondary | ICD-10-CM | POA: Diagnosis not present

## 2014-02-15 DIAGNOSIS — IMO0002 Reserved for concepts with insufficient information to code with codable children: Secondary | ICD-10-CM | POA: Diagnosis not present

## 2014-02-15 DIAGNOSIS — M353 Polymyalgia rheumatica: Secondary | ICD-10-CM | POA: Diagnosis not present

## 2014-02-20 ENCOUNTER — Encounter (HOSPITAL_COMMUNITY): Payer: Self-pay | Admitting: *Deleted

## 2014-02-20 ENCOUNTER — Inpatient Hospital Stay (HOSPITAL_COMMUNITY)
Admission: EM | Admit: 2014-02-20 | Discharge: 2014-02-22 | DRG: 392 | Disposition: A | Discharge status: Home / Self Care | Payer: Medicare Other | Attending: Internal Medicine | Admitting: Internal Medicine

## 2014-02-20 ENCOUNTER — Emergency Department (HOSPITAL_COMMUNITY): Payer: Medicare Other

## 2014-02-20 ENCOUNTER — Observation Stay (HOSPITAL_COMMUNITY): Payer: Medicare Other

## 2014-02-20 DIAGNOSIS — E860 Dehydration: Secondary | ICD-10-CM | POA: Diagnosis not present

## 2014-02-20 DIAGNOSIS — R930 Abnormal findings on diagnostic imaging of skull and head, not elsewhere classified: Secondary | ICD-10-CM | POA: Diagnosis not present

## 2014-02-20 DIAGNOSIS — Z951 Presence of aortocoronary bypass graft: Secondary | ICD-10-CM

## 2014-02-20 DIAGNOSIS — N183 Chronic kidney disease, stage 3 unspecified: Secondary | ICD-10-CM | POA: Diagnosis present

## 2014-02-20 DIAGNOSIS — Z7902 Long term (current) use of antithrombotics/antiplatelets: Secondary | ICD-10-CM

## 2014-02-20 DIAGNOSIS — M353 Polymyalgia rheumatica: Secondary | ICD-10-CM | POA: Diagnosis present

## 2014-02-20 DIAGNOSIS — I739 Peripheral vascular disease, unspecified: Secondary | ICD-10-CM | POA: Diagnosis not present

## 2014-02-20 DIAGNOSIS — Z8673 Personal history of transient ischemic attack (TIA), and cerebral infarction without residual deficits: Secondary | ICD-10-CM | POA: Diagnosis not present

## 2014-02-20 DIAGNOSIS — IMO0002 Reserved for concepts with insufficient information to code with codable children: Secondary | ICD-10-CM | POA: Diagnosis not present

## 2014-02-20 DIAGNOSIS — I6789 Other cerebrovascular disease: Secondary | ICD-10-CM | POA: Diagnosis not present

## 2014-02-20 DIAGNOSIS — I509 Heart failure, unspecified: Secondary | ICD-10-CM | POA: Diagnosis present

## 2014-02-20 DIAGNOSIS — E039 Hypothyroidism, unspecified: Secondary | ICD-10-CM | POA: Diagnosis present

## 2014-02-20 DIAGNOSIS — R5383 Other fatigue: Secondary | ICD-10-CM

## 2014-02-20 DIAGNOSIS — I2510 Atherosclerotic heart disease of native coronary artery without angina pectoris: Secondary | ICD-10-CM | POA: Diagnosis not present

## 2014-02-20 DIAGNOSIS — R531 Weakness: Secondary | ICD-10-CM | POA: Diagnosis present

## 2014-02-20 DIAGNOSIS — A088 Other specified intestinal infections: Secondary | ICD-10-CM | POA: Diagnosis not present

## 2014-02-20 DIAGNOSIS — R112 Nausea with vomiting, unspecified: Secondary | ICD-10-CM | POA: Diagnosis present

## 2014-02-20 DIAGNOSIS — Z87891 Personal history of nicotine dependence: Secondary | ICD-10-CM | POA: Diagnosis not present

## 2014-02-20 DIAGNOSIS — Z7982 Long term (current) use of aspirin: Secondary | ICD-10-CM

## 2014-02-20 DIAGNOSIS — I779 Disorder of arteries and arterioles, unspecified: Secondary | ICD-10-CM | POA: Diagnosis present

## 2014-02-20 DIAGNOSIS — Z79899 Other long term (current) drug therapy: Secondary | ICD-10-CM

## 2014-02-20 DIAGNOSIS — E869 Volume depletion, unspecified: Secondary | ICD-10-CM | POA: Diagnosis not present

## 2014-02-20 DIAGNOSIS — E441 Mild protein-calorie malnutrition: Secondary | ICD-10-CM | POA: Diagnosis present

## 2014-02-20 DIAGNOSIS — R5381 Other malaise: Secondary | ICD-10-CM | POA: Diagnosis not present

## 2014-02-20 DIAGNOSIS — I252 Old myocardial infarction: Secondary | ICD-10-CM | POA: Diagnosis not present

## 2014-02-20 DIAGNOSIS — E785 Hyperlipidemia, unspecified: Secondary | ICD-10-CM | POA: Diagnosis present

## 2014-02-20 DIAGNOSIS — I6529 Occlusion and stenosis of unspecified carotid artery: Secondary | ICD-10-CM | POA: Diagnosis present

## 2014-02-20 DIAGNOSIS — R29810 Facial weakness: Secondary | ICD-10-CM | POA: Diagnosis not present

## 2014-02-20 DIAGNOSIS — Z6827 Body mass index (BMI) 27.0-27.9, adult: Secondary | ICD-10-CM

## 2014-02-20 DIAGNOSIS — R29818 Other symptoms and signs involving the nervous system: Secondary | ICD-10-CM | POA: Diagnosis not present

## 2014-02-20 LAB — CBC
HCT: 37.5 % — ABNORMAL LOW (ref 39.0–52.0)
HEMATOCRIT: 39.8 % (ref 39.0–52.0)
Hemoglobin: 13.6 g/dL (ref 13.0–17.0)
Hemoglobin: 12.6 g/dL — ABNORMAL LOW (ref 13.0–17.0)
MCH: 33.7 pg (ref 26.0–34.0)
MCH: 33.6 pg (ref 26.0–34.0)
MCHC: 34.2 g/dL (ref 30.0–36.0)
MCHC: 33.6 g/dL (ref 30.0–36.0)
MCV: 98.5 fL (ref 78.0–100.0)
MCV: 100 fL (ref 78.0–100.0)
PLATELETS: 122 10*3/uL — AB (ref 150–400)
Platelets: 145 10*3/uL — ABNORMAL LOW (ref 150–400)
RBC: 4.04 MIL/uL — AB (ref 4.22–5.81)
RBC: 3.75 MIL/uL — ABNORMAL LOW (ref 4.22–5.81)
RDW: 12.8 % (ref 11.5–15.5)
RDW: 13.2 % (ref 11.5–15.5)
WBC: 8.6 10*3/uL (ref 4.0–10.5)
WBC: 8.1 10*3/uL (ref 4.0–10.5)

## 2014-02-20 LAB — URINALYSIS, ROUTINE W REFLEX MICROSCOPIC
Bilirubin Urine: NEGATIVE
Glucose, UA: NEGATIVE mg/dL
Hgb urine dipstick: NEGATIVE
KETONES UR: NEGATIVE mg/dL
LEUKOCYTES UA: NEGATIVE
Nitrite: NEGATIVE
PH: 6 (ref 5.0–8.0)
Protein, ur: NEGATIVE mg/dL
Specific Gravity, Urine: 1.016 (ref 1.005–1.030)
Urobilinogen, UA: 0.2 mg/dL (ref 0.0–1.0)

## 2014-02-20 LAB — DIFFERENTIAL
Basophils Absolute: 0 10*3/uL (ref 0.0–0.1)
Basophils Relative: 0 % (ref 0–1)
EOS PCT: 1 % (ref 0–5)
Eosinophils Absolute: 0.1 10*3/uL (ref 0.0–0.7)
LYMPHS PCT: 11 % — AB (ref 12–46)
Lymphs Abs: 0.9 10*3/uL (ref 0.7–4.0)
MONO ABS: 0.5 10*3/uL (ref 0.1–1.0)
MONOS PCT: 6 % (ref 3–12)
Neutro Abs: 7.2 10*3/uL (ref 1.7–7.7)
Neutrophils Relative %: 82 % — ABNORMAL HIGH (ref 43–77)

## 2014-02-20 LAB — COMPREHENSIVE METABOLIC PANEL
ALT: 18 U/L (ref 0–53)
AST: 34 U/L (ref 0–37)
Albumin: 3.1 g/dL — ABNORMAL LOW (ref 3.5–5.2)
Alkaline Phosphatase: 55 U/L (ref 39–117)
BUN: 29 mg/dL — ABNORMAL HIGH (ref 6–23)
CO2: 27 meq/L (ref 19–32)
CREATININE: 1.41 mg/dL — AB (ref 0.50–1.35)
Calcium: 8.8 mg/dL (ref 8.4–10.5)
Chloride: 100 mEq/L (ref 96–112)
GFR, EST AFRICAN AMERICAN: 49 mL/min — AB (ref >90)
GFR, EST NON AFRICAN AMERICAN: 42 mL/min — AB (ref >90)
Glucose, Bld: 113 mg/dL — ABNORMAL HIGH (ref 70–99)
Potassium: 4.7 mEq/L (ref 3.7–5.3)
SODIUM: 140 meq/L (ref 137–147)
Total Bilirubin: 0.5 mg/dL (ref 0.3–1.2)
Total Protein: 5.7 g/dL — ABNORMAL LOW (ref 6.0–8.3)

## 2014-02-20 LAB — I-STAT TROPONIN, ED: Troponin i, poc: 0.03 ng/mL (ref 0.00–0.08)

## 2014-02-20 LAB — CREATININE, SERUM
CREATININE: 1.47 mg/dL — AB (ref 0.50–1.35)
GFR calc Af Amer: 46 mL/min — ABNORMAL LOW (ref >90)
GFR calc non Af Amer: 40 mL/min — ABNORMAL LOW (ref >90)

## 2014-02-20 LAB — I-STAT CHEM 8, ED
BUN: 39 mg/dL — ABNORMAL HIGH (ref 6–23)
Calcium, Ion: 1.09 mmol/L — ABNORMAL LOW (ref 1.13–1.30)
Chloride: 100 mEq/L (ref 96–112)
Creatinine, Ser: 1.4 mg/dL — ABNORMAL HIGH (ref 0.50–1.35)
GLUCOSE: 112 mg/dL — AB (ref 70–99)
HEMATOCRIT: 42 % (ref 39.0–52.0)
HEMOGLOBIN: 14.3 g/dL (ref 13.0–17.0)
Potassium: 4.4 mEq/L (ref 3.7–5.3)
Sodium: 139 mEq/L (ref 137–147)
TCO2: 31 mmol/L (ref 0–100)

## 2014-02-20 LAB — RAPID URINE DRUG SCREEN, HOSP PERFORMED
Amphetamines: NOT DETECTED
Barbiturates: NOT DETECTED
Benzodiazepines: NOT DETECTED
Cocaine: NOT DETECTED
Opiates: NOT DETECTED
Tetrahydrocannabinol: NOT DETECTED

## 2014-02-20 LAB — ETHANOL: Alcohol, Ethyl (B): <11 mg/dL (ref 0–11)

## 2014-02-20 LAB — PROTIME-INR
INR: 0.98 (ref 0.00–1.49)
Prothrombin Time: 12.8 seconds (ref 11.6–15.2)

## 2014-02-20 LAB — APTT: aPTT: 23 seconds — ABNORMAL LOW (ref 24–37)

## 2014-02-20 LAB — TSH: TSH: 2.35 u[IU]/mL (ref 0.350–4.500)

## 2014-02-20 LAB — MAGNESIUM: Magnesium: 1.5 mg/dL (ref 1.5–2.5)

## 2014-02-20 MED ORDER — ONDANSETRON HCL 4 MG/2ML IJ SOLN: 4.0000 mg | Freq: Once | INTRAMUSCULAR | Status: DC

## 2014-02-20 MED ORDER — ACETAMINOPHEN 650 MG RE SUPP: 650.0000 mg | Freq: Four times a day (QID) | RECTAL | Status: DC | PRN

## 2014-02-20 MED ORDER — ALUM & MAG HYDROXIDE-SIMETH 200-200-20 MG/5ML PO SUSP: 30.0000 mL | Freq: Four times a day (QID) | ORAL | Status: DC | PRN

## 2014-02-20 MED ORDER — SODIUM CHLORIDE 0.9 % IV BOLUS (SEPSIS)
500.0000 mL | Freq: Once | INTRAVENOUS | Status: AC
  Administered 2014-02-20: 500 mL via INTRAVENOUS

## 2014-02-20 MED ORDER — ASPIRIN EC 81 MG PO TBEC
81.0000 mg | DELAYED_RELEASE_TABLET | Freq: Every day | ORAL | Status: DC
  Administered 2014-02-21 – 2014-02-22 (×2): 81 mg via ORAL
  Filled 2014-02-20 (×3): qty 1

## 2014-02-20 MED ORDER — PANTOPRAZOLE SODIUM 40 MG PO TBEC
40.0000 mg | DELAYED_RELEASE_TABLET | Freq: Every day | ORAL | Status: DC
  Administered 2014-02-21 – 2014-02-22 (×2): 40 mg via ORAL
  Filled 2014-02-20 (×2): qty 1

## 2014-02-20 MED ORDER — ACETAMINOPHEN 500 MG PO TABS
1000.0000 mg | ORAL_TABLET | Freq: Three times a day (TID) | ORAL | Status: DC | PRN
  Administered 2014-02-21 – 2014-02-22 (×3): 1000 mg via ORAL
  Filled 2014-02-20 (×3): qty 2

## 2014-02-20 MED ORDER — POLYETHYLENE GLYCOL 3350 17 G PO PACK
17.0000 g | PACK | Freq: Every day | ORAL | Status: DC | PRN
  Administered 2014-02-21: 17 g via ORAL
  Filled 2014-02-20: qty 1

## 2014-02-20 MED ORDER — ONDANSETRON HCL 4 MG/2ML IJ SOLN: 4.0000 mg | Freq: Four times a day (QID) | INTRAMUSCULAR | Status: DC | PRN

## 2014-02-20 MED ORDER — CLOPIDOGREL BISULFATE 75 MG PO TABS
75.0000 mg | ORAL_TABLET | Freq: Every day | ORAL | Status: DC
  Administered 2014-02-21 – 2014-02-22 (×2): 75 mg via ORAL
  Filled 2014-02-20 (×2): qty 1

## 2014-02-20 MED ORDER — SODIUM CHLORIDE 0.9 % IV SOLN
INTRAVENOUS | Status: DC
  Administered 2014-02-20: 17:00:00 via INTRAVENOUS

## 2014-02-20 MED ORDER — SODIUM CHLORIDE 0.9 % IV SOLN
Freq: Once | INTRAVENOUS | Status: AC
  Administered 2014-02-20: 14:00:00 via INTRAVENOUS

## 2014-02-20 MED ORDER — ASPIRIN 81 MG PO TABS: 81.0000 mg | ORAL_TABLET | Freq: Every day | ORAL | Status: DC

## 2014-02-20 MED ORDER — ATORVASTATIN CALCIUM 20 MG PO TABS
20.0000 mg | ORAL_TABLET | Freq: Every day | ORAL | Status: DC
  Administered 2014-02-21 – 2014-02-22 (×2): 20 mg via ORAL
  Filled 2014-02-20 (×3): qty 1

## 2014-02-20 MED ORDER — SORBITOL 70 % SOLN
30.0000 mL | Freq: Every day | Status: DC | PRN
  Filled 2014-02-20: qty 30

## 2014-02-20 MED ORDER — DOXAZOSIN MESYLATE 4 MG PO TABS
4.0000 mg | ORAL_TABLET | Freq: Every day | ORAL | Status: DC
  Administered 2014-02-20 – 2014-02-21 (×2): 4 mg via ORAL
  Filled 2014-02-20 (×3): qty 1

## 2014-02-20 MED ORDER — TRAMADOL HCL 50 MG PO TABS
50.0000 mg | ORAL_TABLET | Freq: Two times a day (BID) | ORAL | Status: DC
  Administered 2014-02-20 – 2014-02-22 (×4): 50 mg via ORAL
  Filled 2014-02-20 (×3): qty 1

## 2014-02-20 MED ORDER — SODIUM CHLORIDE 0.9 % IJ SOLN
3.0000 mL | Freq: Two times a day (BID) | INTRAMUSCULAR | Status: DC
  Administered 2014-02-20 – 2014-02-22 (×4): 3 mL via INTRAVENOUS

## 2014-02-20 MED ORDER — ONDANSETRON HCL 4 MG PO TABS: 4.0000 mg | ORAL_TABLET | Freq: Four times a day (QID) | ORAL | Status: DC | PRN

## 2014-02-20 MED ORDER — AMLODIPINE BESYLATE 2.5 MG PO TABS
2.5000 mg | ORAL_TABLET | Freq: Every day | ORAL | Status: DC
  Administered 2014-02-21 – 2014-02-22 (×2): 2.5 mg via ORAL
  Filled 2014-02-20 (×3): qty 1

## 2014-02-20 MED ORDER — LEVOTHYROXINE SODIUM 75 MCG PO TABS
75.0000 ug | ORAL_TABLET | Freq: Every day | ORAL | Status: DC
  Administered 2014-02-21 – 2014-02-22 (×2): 75 ug via ORAL
  Filled 2014-02-20 (×3): qty 1

## 2014-02-20 MED ORDER — MAGNESIUM CITRATE PO SOLN: 1.0000 | Freq: Once | ORAL | Status: AC | PRN

## 2014-02-20 MED ORDER — ACETAMINOPHEN 325 MG PO TABS: 650.0000 mg | ORAL_TABLET | Freq: Four times a day (QID) | ORAL | Status: DC | PRN

## 2014-02-20 MED ORDER — ENOXAPARIN SODIUM 40 MG/0.4ML ~~LOC~~ SOLN
40.0000 mg | SUBCUTANEOUS | Status: DC
  Administered 2014-02-20 – 2014-02-21 (×2): 40 mg via SUBCUTANEOUS
  Filled 2014-02-20 (×3): qty 0.4

## 2014-02-20 MED ORDER — PREDNISONE 5 MG PO TABS
5.0000 mg | ORAL_TABLET | Freq: Every day | ORAL | Status: DC
  Administered 2014-02-21 – 2014-02-22 (×2): 5 mg via ORAL
  Filled 2014-02-20 (×3): qty 1

## 2014-02-20 MED ORDER — SODIUM CHLORIDE 0.9 % IV SOLN: INTRAVENOUS | Status: DC

## 2014-02-20 NOTE — Consult Note (Signed)
Referring Physician: Ashok Cordia    Chief Complaint: Code stroke  HPI:                                                                                                                                         Barry Wright is an 80 y.o. male who was seen this AM and family noted he was not acting himself.  EMS was called to the house and patient had refused transport to hospital.  At 11 AM CNA who cares for the patient noted he was leaning to the right and had possible right facial asymmetry.  EMS was again called to the house. On this arrival patient was brought to Hospital for possible code stroke. On arrival patient is very drowsy, diffusely weak in the LE and unable to give date.  He would follow simple commands.  He showed no focal or lateralizing symptoms.   Of note, there was note of urinary frequency and possible N/V earlier in the day. No family is present at this time to confirm this.   Date last known well: Unable to determine  Time last known well: Unable to determine (EMS was called out earlier this am due to patient not feeling well and then called back) tPA Given: No: unable to determine LNW and no focal symptoms NIHSS 10  Past Medical History  Diagnosis Date  . Chronic back pain   . Hypercholesteremia   . Hypothyroidism   . CAD (coronary artery disease)   . Chronic kidney disease (CKD), stage III (moderate)   . Polymyalgia rheumatica   . Left bundle branch block   . Hx of CABG   . History of nephrectomy   . Internal carotid artery stenosis   . Myocardial infarction   . CHF (congestive heart failure)   . Stroke     Past Surgical History  Procedure Laterality Date  . Coronary artery bypass graft    . Cholecystectomy    . Kidney surgery      kidney removed    Family History  Problem Relation Age of Onset  . Other Mother     [place] Flu  . Heart disease Father    Social History:  reports that he quit smoking about 43 years ago. His smoking use included  Cigarettes. He smoked 0.00 packs per day. He has never used smokeless tobacco. He reports that he does not drink alcohol or use illicit drugs.  Allergies:  Allergies  Allergen Reactions  . Penicillins Rash    Medications:  No current facility-administered medications for this encounter.   Current Outpatient Prescriptions  Medication Sig Dispense Refill  . acetaminophen (TYLENOL) 500 MG tablet Take 1,000 mg by mouth every 8 (eight) hours as needed for pain.      Marland Kitchen amLODipine (NORVASC) 2.5 MG tablet Take 2.5 mg by mouth daily.        Marland Kitchen aspirin 81 MG tablet Take 81 mg by mouth daily.      Marland Kitchen atorvastatin (LIPITOR) 20 MG tablet Take 20 mg by mouth daily.      . clopidogrel (PLAVIX) 75 MG tablet Take 75 mg by mouth daily.        Marland Kitchen doxazosin (CARDURA) 4 MG tablet Take 4 mg by mouth at bedtime.      . gabapentin (NEURONTIN) 100 MG capsule Take 200 mg by mouth at bedtime.      Marland Kitchen levothyroxine (SYNTHROID, LEVOTHROID) 75 MCG tablet Take 75 mcg by mouth daily.      . Multiple Vitamins-Minerals (MULTIVITAMINS THER. W/MINERALS) TABS Take 1 tablet by mouth daily.        . predniSONE (DELTASONE) 5 MG tablet Take 5 mg by mouth daily.      . traMADol (ULTRAM) 50 MG tablet Take 50 mg by mouth 2 (two) times daily.         ROS:                                                                                                                                       History obtained from the patient  General ROS: negative for - chills, fatigue, fever, night sweats, weight gain or weight loss Psychological ROS: negative for - behavioral disorder, hallucinations, memory difficulties, mood swings or suicidal ideation Ophthalmic ROS: negative for - blurry vision, double vision, eye pain or loss of vision ENT ROS: negative for - epistaxis, nasal discharge, oral lesions, sore throat, tinnitus or  vertigo Allergy and Immunology ROS: negative for - hives or itchy/watery eyes Hematological and Lymphatic ROS: negative for - bleeding problems, bruising or swollen lymph nodes Endocrine ROS: negative for - galactorrhea, hair pattern changes, polydipsia/polyuria or temperature intolerance Respiratory ROS: negative for - cough, hemoptysis, shortness of breath or wheezing Cardiovascular ROS: negative for - chest pain, dyspnea on exertion, edema or irregular heartbeat Gastrointestinal ROS: negative for - abdominal pain, diarrhea, hematemesis, nausea/vomiting or stool incontinence Genito-Urinary ROS: negative for - dysuria, hematuria, incontinence or urinary frequency/urgency Musculoskeletal ROS: negative for - joint swelling or muscular weakness Neurological ROS: as noted in HPI Dermatological ROS: negative for rash and skin lesion changes  Neurologic Examination:  Blood pressure 129/54, pulse 84, resp. rate 25, SpO2 100.00%.   Mental Status: Drowsy but easily awakened with verbal or tactile stimuli, not oriented to place, year or month.  Speech fluent without evidence of aphasia.  Able to name objects and repeat words.  Able to follow simple commands without difficulty. Cranial Nerves: II: Discs flat bilaterally; Visual fields grossly normal, pupils equal, round, reactive to light and accommodation III,IV, VI: ptosis not present, extra-ocular motions intact bilaterally V,VII: smile with mild left NL fold decrease, facial light touch sensation normal bilaterally VIII: hearing normal bilaterally IX,X: gag reflex present XI: bilateral shoulder shrug XII: midline tongue extension without atrophy or fasciculations  Motor: Moving bilateral UE antigravity. Bilateral LE diffusely weak but able to lift of the bed for brief period of time  Sensory: able to feel PP bilaterally UE and LE and withdraws  from pain in all extremities.  Deep Tendon Reflexes:  Right: Upper Extremity   Left: Upper extremity   biceps (C-5 to C-6) 2/4   biceps (C-5 to C-6) 2/4 tricep (C7) 2/4    triceps (C7) 2/4 Brachioradialis (C6) 2/4  Brachioradialis (C6) 2/4  Lower Extremity Lower Extremity  quadriceps (L-2 to L-4) 1/4   quadriceps (L-2 to L-4) 1/4 Achilles (S1) 0/4   Achilles (S1) 0/4  Plantars: Right: downgoing   Left: downgoing Cerebellar: normal finger-to-nose, unable to assess heel-to-shin test Gait: unable to assess CV: pulses palpable throughout    Lab Results: Basic Metabolic Panel:  Recent Labs Lab 02/20/14 1316 02/20/14 1330  NA 140 139  K 4.7 4.4  CL 100 100  CO2 27  --   GLUCOSE 113* 112*  BUN 29* 39*  CREATININE 1.41* 1.40*  CALCIUM 8.8  --     Liver Function Tests:  Recent Labs Lab 02/20/14 1316  AST 34  ALT 18  ALKPHOS 55  BILITOT 0.5  PROT 5.7*  ALBUMIN 3.1*   No results found for this basename: LIPASE, AMYLASE,  in the last 168 hours No results found for this basename: AMMONIA,  in the last 168 hours  CBC:  Recent Labs Lab 02/20/14 1316 02/20/14 1330  WBC 8.6  --   NEUTROABS 7.2  --   HGB 13.6 14.3  HCT 39.8 42.0  MCV 98.5  --   PLT 145*  --     Cardiac Enzymes: No results found for this basename: CKTOTAL, CKMB, CKMBINDEX, TROPONINI,  in the last 168 hours  Lipid Panel: No results found for this basename: CHOL, TRIG, HDL, CHOLHDL, VLDL, LDLCALC,  in the last 168 hours  CBG: No results found for this basename: GLUCAP,  in the last 168 hours  Microbiology: Results for orders placed during the hospital encounter of 01/03/13  URINE CULTURE     Status: None   Collection Time    01/03/13  4:18 PM      Result Value Ref Range Status   Specimen Description URINE, CLEAN CATCH   Final   Special Requests NONE   Final   Culture  Setup Time 01/03/2013 17:53   Final   Colony Count NO GROWTH   Final   Culture NO GROWTH   Final   Report Status  01/04/2013 FINAL   Final    Coagulation Studies:  Recent Labs  02/20/14 1316  LABPROT 12.8  INR 0.98    Imaging: Ct Head Wo Contrast  02/20/2014   CLINICAL DATA:  Acute onset right facial weakness.  Code stroke.  EXAM: CT HEAD WITHOUT  CONTRAST  TECHNIQUE: Contiguous axial images were obtained from the base of the skull through the vertex without intravenous contrast.  COMPARISON:  12/29/12  FINDINGS: There is no evidence of intracranial hemorrhage, brain edema, or other signs of acute infarction. There is no evidence of intracranial mass lesion or mass effect. No abnormal extraaxial fluid collections are identified.  Moderate cerebral atrophy and chronic small vessel disease is stable in appearance. Old left basal ganglia 10 again noted. Ventricles are stable in size. No evidence of skull fracture or other bone lesion.  IMPRESSION: No acute intracranial abnormality.  Stable cerebral atrophy and chronic small vessel disease.  These results were called by telephone at the time of interpretation on 02/20/2014 at 1:31 PM to Dr. Alexis Goodell, who verbally acknowledged these results.   Electronically Signed   By: Earle Gell M.D.   On: 02/20/2014 13:37     Etta Quill PA-C [facility] [phone]  02/20/2014, 2:15 PM   Patient seen and examined.  Clinical course and management discussed.  Necessary edits performed.  I agree with the above.  Assessment and plan of care developed and discussed below   Assessment: 80 y.o. male presenting with altered facial droop and leaning to the right.  On neurological examination there is no real focality at this time.  Blood pressure is somewhat low.  Patient seems dehydrated clinically.  Head CT reviewed and shows no acute changes.  Do not suspect stroke at this time.  Patient is outside time window for tPA since it seems that he actually has not been doing well for the majority of the day.    Stroke Risk Factors - hyperlipidemia and  CAD  Recommendations: 1.  250cc fluid bolus 2.  Continue ASA and Plavix 3.  Continue neuro checks    Case discussed with Dr. Barbaraann Share, MD [facility] [phone]  02/20/2014  2:15 PM

## 2014-02-20 NOTE — ED Notes (Signed)
Myself and Levada Dy, RN cleaned up pt and replaced a clean dry sheet on stretcher and 2 chuks placed under pt; pt's wife and home aide stepped out of room for [place] to clean him

## 2014-02-20 NOTE — ED Notes (Signed)
Pt from home.  Pt has a CNA that helps with care.  EMS called out for R facial droop and "leaning" to the right.  EMS reports they were called out early this morning for c/o "not feeling well", but pt refused transport.  Upon arrival to ED pt is lethargic.

## 2014-02-20 NOTE — ED Notes (Signed)
Pt to 4N 26.

## 2014-02-20 NOTE — H&P (Signed)
[name] History and Physical  Barry Wright HCW:[id number] DOB: 1920-02-01 DOA: 02/20/2014  Referring physician: Dr. Ashok Cordia PCP: Horton Finer, MD   Chief Complaint: Right facial asymmetry/code stroke  HPI: Barry Wright is a 80 y.o. male  With the prior history of TIA, hyperlipidemia, hypothyroidism, coronary artery disease, history of CABG, history of internal carotid artery stenosis presented to the ED as a code stroke. Per family patient had some emesis in the early hours of the morning around 3 AM with some associated nausea. Patient's wife stated that patient woke up around 7:30 AM surgical done October for the caretaker. Patient subsequently went to the bathroom however was so weak to the point he could not stand up and a such EMS were called. The caretaker stated that she noticed some right-sided facial weakness. When EMS initially arrived around 9:30 in the morning patient refused to the hospital. EMS was subsequently called around 12:30 in the afternoon and patient agreed to present to the emergency room. Patient's wife and caretaker deny that patient had any fevers, no chills, no chest pain, no shortness of breath, no change in chronic abdominal pain, no change in chronic constipation, no diarrhea, no dysuria, no melena, no hematemesis, no hematochezia. No bright red blood per rectum. Patient was initially seen in the ED and the code stroke was called due to concerns for presentation with facial asymmetry. Patient underwent a head CT which was negative for any acute abnormalities. Comprehensive metabolic profile to have a BUN of 29 creatinine 1.4 otherwise was within normal limits. CBC was within normal limits. Patient was seen by neurology it was felt patient did not have any focal or lateralizing symptoms at the time and it was recommended that patient be monitored on observation and hydrated with IV fluids, with neuro checks. We were called to admit the patient for  further evaluation and management.   Review of Systems: As per history of present illness otherwise negative. Constitutional:  No weight loss, night sweats, Fevers, chills, fatigue.  HEENT:  No headaches, Difficulty swallowing,Tooth/dental problems,Sore throat,  No sneezing, itching, ear ache, nasal congestion, post nasal drip,  Cardio-vascular:  No chest pain, Orthopnea, PND, swelling in lower extremities, anasarca, dizziness, palpitations  GI:  No heartburn, indigestion, abdominal pain, nausea, vomiting, diarrhea, change in bowel habits, loss of appetite  Resp:  No shortness of breath with exertion or at rest. No excess mucus, no productive cough, No non-productive cough, No coughing up of blood.No change in color of mucus.No wheezing.No chest wall deformity  Skin:  no rash or lesions.  GU:  no dysuria, change in color of urine, no urgency or frequency. No flank pain.  Musculoskeletal:  No joint pain or swelling. No decreased range of motion. No back pain.  Psych:  No change in mood or affect. No depression or anxiety. No memory loss.   Past Medical History  Diagnosis Date  . Chronic back pain   . Hypercholesteremia   . Hypothyroidism   . CAD (coronary artery disease)   . Chronic kidney disease (CKD), stage III (moderate)   . Polymyalgia rheumatica   . Left bundle branch block   . Hx of CABG   . History of nephrectomy   . Internal carotid artery stenosis   . Myocardial infarction   . CHF (congestive heart failure)   . Stroke    Past Surgical History  Procedure Laterality Date  . Coronary artery bypass graft    . Cholecystectomy    .  Kidney surgery      kidney removed   Social History:  reports that he quit smoking about 43 years ago. His smoking use included Cigarettes. He smoked 0.00 packs per day. He has never used smokeless tobacco. He reports that he does not drink alcohol or use illicit drugs.  Allergies  Allergen Reactions  . Penicillins Rash    Family  History  Problem Relation Age of Onset  . Other Mother     [place] Flu  . Heart disease Father      Prior to Admission medications   Medication Sig Start Date End Date Taking? Authorizing Provider  acetaminophen (TYLENOL) 500 MG tablet Take 1,000 mg by mouth every 8 (eight) hours as needed for pain.    Historical Provider, MD  amLODipine (NORVASC) 2.5 MG tablet Take 2.5 mg by mouth daily.      Historical Provider, MD  aspirin 81 MG tablet Take 81 mg by mouth daily.    Historical Provider, MD  atorvastatin (LIPITOR) 20 MG tablet Take 20 mg by mouth daily.    Historical Provider, MD  clopidogrel (PLAVIX) 75 MG tablet Take 75 mg by mouth daily.      Historical Provider, MD  doxazosin (CARDURA) 4 MG tablet Take 4 mg by mouth at bedtime.    Historical Provider, MD  gabapentin (NEURONTIN) 100 MG capsule Take 200 mg by mouth at bedtime.    Historical Provider, MD  levothyroxine (SYNTHROID, LEVOTHROID) 75 MCG tablet Take 75 mcg by mouth daily.    Historical Provider, MD  Multiple Vitamins-Minerals (MULTIVITAMINS THER. W/MINERALS) TABS Take 1 tablet by mouth daily.      Historical Provider, MD  predniSONE (DELTASONE) 5 MG tablet Take 5 mg by mouth daily.    Historical Provider, MD  traMADol (ULTRAM) 50 MG tablet Take 50 mg by mouth 2 (two) times daily.    Historical Provider, MD   Physical Exam: Filed Vitals:   02/20/14 1600  BP: 131/63  Pulse: 81  Temp:   Resp: 31    BP 131/63  Pulse 81  Temp(Src) 99.2 F (37.3 C)  Resp 31  SpO2 96%  General:  Patient sleeping and snoring arousable to verbal stimuli however quickly falls back asleep. Eyes: PERRLA, EOMI, normal lids, irises & conjunctiva ENT: grossly normal hearing, lips & tongue. Extremely dry mucous membranes. Neck: no LAD, masses or thyromegaly Cardiovascular: RRR, no m/r/g. No LE edema. Telemetry: SR, no arrhythmias  Respiratory: CTA bilaterally anterior lung fields, no w/r/r. Normal respiratory effort. Abdomen: soft, ntnd,  positive bowel sounds, no rebound, no guarding Skin: no rash or induration seen on limited exam Musculoskeletal: grossly normal tone BUE/BLE Psychiatric: grossly normal mood and affect, speech fluent and appropriate Neurologic: Patient drowsy and sleepy however arousable with verbal stimuli. Cranial nerves II through XII are grossly intact. Sensation is intact. Unable to elicit reflexes symmetrically and are diffusely. Unable to assess visual fields due to patient's current state. Gait not tested secondary to safety.           Labs on Admission:  Basic Metabolic Panel:  Recent Labs Lab 02/20/14 1316 02/20/14 1330  NA 140 139  K 4.7 4.4  CL 100 100  CO2 27  --   GLUCOSE 113* 112*  BUN 29* 39*  CREATININE 1.41* 1.40*  CALCIUM 8.8  --    Liver Function Tests:  Recent Labs Lab 02/20/14 1316  AST 34  ALT 18  ALKPHOS 55  BILITOT 0.5  PROT 5.7*  ALBUMIN 3.1*   No results found for this basename: LIPASE, AMYLASE,  in the last 168 hours No results found for this basename: AMMONIA,  in the last 168 hours CBC:  Recent Labs Lab 02/20/14 1316 02/20/14 1330  WBC 8.6  --   NEUTROABS 7.2  --   HGB 13.6 14.3  HCT 39.8 42.0  MCV 98.5  --   PLT 145*  --    Cardiac Enzymes: No results found for this basename: CKTOTAL, CKMB, CKMBINDEX, TROPONINI,  in the last 168 hours  BNP (last 3 results) No results found for this basename: PROBNP,  in the last 8760 hours CBG: No results found for this basename: GLUCAP,  in the last 168 hours  Radiological Exams on Admission: Ct Head Wo Contrast  02/20/2014   CLINICAL DATA:  Acute onset right facial weakness.  Code stroke.  EXAM: CT HEAD WITHOUT CONTRAST  TECHNIQUE: Contiguous axial images were obtained from the base of the skull through the vertex without intravenous contrast.  COMPARISON:  12/29/12  FINDINGS: There is no evidence of intracranial hemorrhage, brain edema, or other signs of acute infarction. There is no evidence of  intracranial mass lesion or mass effect. No abnormal extraaxial fluid collections are identified.  Moderate cerebral atrophy and chronic small vessel disease is stable in appearance. Old left basal ganglia 10 again noted. Ventricles are stable in size. No evidence of skull fracture or other bone lesion.  IMPRESSION: No acute intracranial abnormality.  Stable cerebral atrophy and chronic small vessel disease.  These results were called by telephone at the time of interpretation on 02/20/2014 at 1:31 PM to Dr. Alexis Goodell, who verbally acknowledged these results.   Electronically Signed   By: Earle Gell M.D.   On: 02/20/2014 13:37    EKG: Independently reviewed.   Assessment/Plan Principal Problem:   Weakness Active Problems:   Dehydration   Chronic kidney disease (CKD), stage III (moderate)   Polymyalgia rheumatica   Carotid artery disease   Coronary artery disease   Nausea & vomiting  #1 weakness Questionable etiology. Likely secondary to volume depletion from nausea and emesis/dehydration versus neurological etiology. CT of the head which was done was negative. Patient has been seen by neurology and doubt if this is an acute CVA. Will admit the patient to telemetry floor. Will check an acute abdominal series. Will hydrate gently with IV fluids. PT/OT, supportive care.  #2 dehydration IV fluids. Follow.  #3 nausea and vomiting Questionable etiology. My be secondary to viral gastroenteritis. We'll get an acute abdominal series. IV Zofran. Follow.  #4 coronary artery disease Stable. Continue Norvasc, aspirin, Lipitor, Plavix.  #5 history of TIA CT is negative. Patient on aspirin and Plavix. Continue.  #6 polymyalgia rheumatica Continue steroids.  #7 chronic kidney disease stage III Stable.  #8 prophylaxis PPI for GI prophylaxis. Lovenox for DVT prophylaxis.  Code Status: Full Family Communication: Updated patient and wife at bedside. Disposition Plan: Admit to  telemetry.  Time spent: Churchill MD [name] Pager [phone]

## 2014-02-20 NOTE — ED Provider Notes (Signed)
CSN: [id number]     Arrival date & time 02/20/14  1314 History   First MD Initiated Contact with Patient 02/20/14 1315     Chief Complaint  Patient presents with  . Code Stroke     (Consider location/radiation/quality/duration/timing/severity/associated sxs/prior Treatment) The history is provided by the patient and the EMS personnel. The history is limited by the condition of the patient.  pt from home via ems.  Per report, home health worker had noted pt leaning to right, w ?right facial/right sided weakness. Unclear when symptoms began or when pt last seen normal. Pt appears awake and alert. Limited historian, poorly responsive to questioning - ?baseline mental status/mild dementia vs cva vs altered mental status due to other medical illness - level 5 caveat.    Past Medical History  Diagnosis Date  . Chronic back pain   . Hypercholesteremia   . Hypothyroidism   . CAD (coronary artery disease)   . Chronic kidney disease (CKD), stage III (moderate)   . Polymyalgia rheumatica   . Left bundle branch block   . Hx of CABG   . History of nephrectomy   . Internal carotid artery stenosis   . Myocardial infarction   . CHF (congestive heart failure)   . Stroke    Past Surgical History  Procedure Laterality Date  . Coronary artery bypass graft    . Cholecystectomy    . Kidney surgery      kidney removed   Family History  Problem Relation Age of Onset  . Other Mother     [place] Flu  . Heart disease Father    History  Substance Use Topics  . Smoking status: Former Smoker    Types: Cigarettes    Quit date: 11/10/1970  . Smokeless tobacco: Never Used  . Alcohol Use: No    Review of Systems  Unable to perform ROS: Mental status change  Constitutional: Negative for fever.  Neurological: Negative for headaches.  level 5 caveat, altered mental status.       Allergies  Penicillins  Home Medications   Current Outpatient Rx  Name  Route  Sig  Dispense  Refill  .  acetaminophen (TYLENOL) 500 MG tablet   Oral   Take 1,000 mg by mouth every 8 (eight) hours as needed for pain.         Marland Kitchen amLODipine (NORVASC) 2.5 MG tablet   Oral   Take 2.5 mg by mouth daily.           Marland Kitchen aspirin 81 MG tablet   Oral   Take 81 mg by mouth daily.         Marland Kitchen atorvastatin (LIPITOR) 20 MG tablet   Oral   Take 20 mg by mouth daily.         . clopidogrel (PLAVIX) 75 MG tablet   Oral   Take 75 mg by mouth daily.           Marland Kitchen doxazosin (CARDURA) 4 MG tablet   Oral   Take 4 mg by mouth at bedtime.         . gabapentin (NEURONTIN) 100 MG capsule   Oral   Take 200 mg by mouth at bedtime.         Marland Kitchen levothyroxine (SYNTHROID, LEVOTHROID) 75 MCG tablet   Oral   Take 75 mcg by mouth daily.         . Multiple Vitamins-Minerals (MULTIVITAMINS THER. W/MINERALS) TABS   Oral   Take 1  tablet by mouth daily.           . predniSONE (DELTASONE) 5 MG tablet   Oral   Take 5 mg by mouth daily.         . traMADol (ULTRAM) 50 MG tablet   Oral   Take 50 mg by mouth 2 (two) times daily.          There were no vitals taken for this visit. Physical Exam  Nursing note and vitals reviewed. Constitutional: He appears well-developed and well-nourished. No distress.  HENT:  Head: Atraumatic.  Mouth/Throat: Oropharynx is clear and moist.  Eyes: EOM are normal. Pupils are equal, round, and reactive to light.  Neck: Neck supple. No tracheal deviation present.  No bruit. No stiffness or rigidity.  Cardiovascular: Normal rate, regular rhythm, normal heart sounds and intact distal pulses.   Pulmonary/Chest: Effort normal and breath sounds normal. No accessory muscle usage. No respiratory distress.  Abdominal: Soft. Bowel sounds are normal. He exhibits no distension and no mass. There is no tenderness. There is no rebound and no guarding.  No hernia  Genitourinary:  No cva tenderness  Musculoskeletal: Normal range of motion. He exhibits no edema and no tenderness.   Neurological: He is alert. No cranial nerve deficit.  Alert, oriented to person and place. Speech is very faint/quiet, and answers in 1-2 word answers. No facial droop. No pronator drift. Equal grip. Symmetric reflexes.    Skin: Skin is warm and dry. No rash noted. He is not diaphoretic.  Psychiatric:  Slow to respond.     ED Course  Procedures (including critical care time)    Results for orders placed during the hospital encounter of 02/20/14  ETHANOL      Result Value Ref Range   Alcohol, Ethyl (B) <11  0 - 11 mg/dL  PROTIME-INR      Result Value Ref Range   Prothrombin Time 12.8  11.6 - 15.2 seconds   INR 0.98  0.00 - 1.49  APTT      Result Value Ref Range   aPTT 23 (*) 24 - 37 seconds  CBC      Result Value Ref Range   WBC 8.6  4.0 - 10.5 K/uL   RBC 4.04 (*) 4.22 - 5.81 MIL/uL   Hemoglobin 13.6  13.0 - 17.0 g/dL   HCT 39.8  39.0 - 52.0 %   MCV 98.5  78.0 - 100.0 fL   MCH 33.7  26.0 - 34.0 pg   MCHC 34.2  30.0 - 36.0 g/dL   RDW 12.8  11.5 - 15.5 %   Platelets 145 (*) 150 - 400 K/uL  DIFFERENTIAL      Result Value Ref Range   Neutrophils Relative % 82 (*) 43 - 77 %   Neutro Abs 7.2  1.7 - 7.7 K/uL   Lymphocytes Relative 11 (*) 12 - 46 %   Lymphs Abs 0.9  0.7 - 4.0 K/uL   Monocytes Relative 6  3 - 12 %   Monocytes Absolute 0.5  0.1 - 1.0 K/uL   Eosinophils Relative 1  0 - 5 %   Eosinophils Absolute 0.1  0.0 - 0.7 K/uL   Basophils Relative 0  0 - 1 %   Basophils Absolute 0.0  0.0 - 0.1 K/uL  COMPREHENSIVE METABOLIC PANEL      Result Value Ref Range   Sodium 140  137 - 147 mEq/L   Potassium 4.7  3.7 - 5.3 mEq/L  Chloride 100  96 - 112 mEq/L   CO2 27  19 - 32 mEq/L   Glucose, Bld 113 (*) 70 - 99 mg/dL   BUN 29 (*) 6 - 23 mg/dL   Creatinine, Ser 1.41 (*) 0.50 - 1.35 mg/dL   Calcium 8.8  8.4 - 10.5 mg/dL   Total Protein 5.7 (*) 6.0 - 8.3 g/dL   Albumin 3.1 (*) 3.5 - 5.2 g/dL   AST 34  0 - 37 U/L   ALT 18  0 - 53 U/L   Alkaline Phosphatase 55  39 - 117 U/L    Total Bilirubin 0.5  0.3 - 1.2 mg/dL   GFR calc non Af Amer 42 (*) >90 mL/min   GFR calc Af Amer 49 (*) >90 mL/min  I-STAT CHEM 8, ED      Result Value Ref Range   Sodium 139  137 - 147 mEq/L   Potassium 4.4  3.7 - 5.3 mEq/L   Chloride 100  96 - 112 mEq/L   BUN 39 (*) 6 - 23 mg/dL   Creatinine, Ser 1.40 (*) 0.50 - 1.35 mg/dL   Glucose, Bld 112 (*) 70 - 99 mg/dL   Calcium, Ion 1.09 (*) 1.13 - 1.30 mmol/L   TCO2 31  0 - 100 mmol/L   Hemoglobin 14.3  13.0 - 17.0 g/dL   HCT 42.0  39.0 - 52.0 %  I-STAT TROPOININ, ED      Result Value Ref Range   Troponin i, poc 0.03  0.00 - 0.08 ng/mL   Comment 3            Ct Head Wo Contrast  02/20/2014   CLINICAL DATA:  Acute onset right facial weakness.  Code stroke.  EXAM: CT HEAD WITHOUT CONTRAST  TECHNIQUE: Contiguous axial images were obtained from the base of the skull through the vertex without intravenous contrast.  COMPARISON:  12/29/12  FINDINGS: There is no evidence of intracranial hemorrhage, brain edema, or other signs of acute infarction. There is no evidence of intracranial mass lesion or mass effect. No abnormal extraaxial fluid collections are identified.  Moderate cerebral atrophy and chronic small vessel disease is stable in appearance. Old left basal ganglia 10 again noted. Ventricles are stable in size. No evidence of skull fracture or other bone lesion.  IMPRESSION: No acute intracranial abnormality.  Stable cerebral atrophy and chronic small vessel disease.  These results were called by telephone at the time of interpretation on 02/20/2014 at 1:31 PM to Dr. Alexis Goodell, who verbally acknowledged these results.   Electronically Signed   By: Earle Gell M.D.   On: 02/20/2014 13:37       Date: 02/20/2014  Rate: 84  Rhythm: normal sinus rhythm  QRS Axis: left  Intervals: normal  ST/T Wave abnormalities: nonspecific ST/T changes  Conduction Disutrbances:right bundle branch block  Narrative Interpretation:   Old EKG Reviewed:  unchanged    MDM   Iv ns. Pt was called as code stroke pta.   Monitor. Stat ct. Labs.   Reviewed nursing notes and prior charts for additional history.   Stroke team in ED evaluating pt.   Family and caregiver arrive, state pt in normal health yesterday, had eaten and drank normally.  This morning, at 0300, onset nv, vomited several x today and did not eat breakfast. They note generally weak, not as conversant as normal, altered ms, and ?right weakness.  At baseline, gets self up in morning, dresses self/attends to ADLs, drives, is  conversant, spouse/caregiver deny hx dementia.   Stroke team indicates no indication for tpa, and that pt be admitted for med eval/tx, neurochecks, etc.  Iv ns bolus. Labs. Urine remains pending.  Cath for urine.  Given recent/current nv illness, ?viral vs uti vs other, generalized weakness, altered mental state as compared to baseline, will require admission. Med service called. ua remains pending.      Mirna Mires, MD 02/20/14 [phone]

## 2014-02-21 ENCOUNTER — Inpatient Hospital Stay (HOSPITAL_COMMUNITY): Payer: Medicare Other

## 2014-02-21 DIAGNOSIS — E869 Volume depletion, unspecified: Secondary | ICD-10-CM | POA: Diagnosis present

## 2014-02-21 LAB — CBC
HCT: 35.6 % — ABNORMAL LOW (ref 39.0–52.0)
Hemoglobin: 12.2 g/dL — ABNORMAL LOW (ref 13.0–17.0)
MCH: 34.1 pg — ABNORMAL HIGH (ref 26.0–34.0)
MCHC: 34.3 g/dL (ref 30.0–36.0)
MCV: 99.4 fL (ref 78.0–100.0)
Platelets: 107 10*3/uL — ABNORMAL LOW (ref 150–400)
RBC: 3.58 MIL/uL — AB (ref 4.22–5.81)
RDW: 13.3 % (ref 11.5–15.5)
WBC: 6.3 10*3/uL (ref 4.0–10.5)

## 2014-02-21 LAB — BASIC METABOLIC PANEL
BUN: 26 mg/dL — ABNORMAL HIGH (ref 6–23)
CALCIUM: 8 mg/dL — AB (ref 8.4–10.5)
CO2: 24 mEq/L (ref 19–32)
Chloride: 103 mEq/L (ref 96–112)
Creatinine, Ser: 1.44 mg/dL — ABNORMAL HIGH (ref 0.50–1.35)
GFR calc non Af Amer: 41 mL/min — ABNORMAL LOW (ref >90)
GFR, EST AFRICAN AMERICAN: 47 mL/min — AB (ref >90)
Glucose, Bld: 95 mg/dL (ref 70–99)
Potassium: 3.7 mEq/L (ref 3.7–5.3)
Sodium: 140 mEq/L (ref 137–147)

## 2014-02-21 LAB — URINE CULTURE
CULTURE: NO GROWTH
Colony Count: NO GROWTH

## 2014-02-21 MED ORDER — LORAZEPAM 0.5 MG PO TABS
0.5000 mg | ORAL_TABLET | Freq: Once | ORAL | Status: AC
  Administered 2014-02-21: 0.5 mg via ORAL
  Filled 2014-02-21: qty 1

## 2014-02-21 NOTE — Evaluation (Addendum)
Occupational Therapy Evaluation Patient Details Name: Barry Wright MRN: [id number] DOB: 04-23-1920 Today's Date: 02/21/2014    History of Present Illness Barry Wright is an 80 y.o. male who was seen this AM and family noted he was not acting himself.  EMS was called to the house and patient had refused transport to hospital.  At 11 AM CNA who cares for the patient noted he was leaning to the right and had possible right facial asymmetry.  EMS was again called to the house. On this arrival patient was brought to Hospital for possible code stroke. On arrival patient is very drowsy, diffusely weak in the LE and unable to give date.  He would follow simple commands.  He showed no focal or lateralizing symptoms. Patient also with N/V/D and dehydration per chart.   Clinical Impression   Pt admitted with the above diagnoses and presents with below problem list. Pt will benefit from continued OT to address the below listed deficits and maximize independence with basci ADLs prior to d/c home. Pt mod A supine > EOB, min A sit>stand, min guard to ambulate. Pt cued for hand placement with walker, to keep walker close during transfers, and to stand tall in standing. Pt and cg educated to sit for dressing and bathing. Educated on energy conservation techniques. Pt unable to don/doff socks this date. Discussed sock aid.     Follow Up Recommendations       Equipment Recommendations       Recommendations for Other Services       Precautions / Restrictions Precautions Precautions: Fall      Mobility Bed Mobility Overal bed mobility: Needs Assistance Bed Mobility: Supine to Sit     Supine to sit: Mod assist     General bed mobility comments: mod A due to generalized weakness. extra time   Transfers Overall transfer level: Needs assistance Equipment used: Rolling walker (2 wheeled) Transfers: Sit to/from [facility] Sit to Stand: Min assist Stand pivot transfers: Min  guard       General transfer comment: Pt able to scoot sideways EOB. cues for hand placement and to keep walker in front of him.    Balance Overall balance assessment: Needs assistance Sitting-balance support: Feet supported;Bilateral upper extremity supported Sitting balance-Leahy Scale: Good     Standing balance support: Bilateral upper extremity supported;During functional activity Standing balance-Leahy Scale: Fair Standing balance comment: cues to stand all the way up                             ADL Overall ADL's : Needs assistance/impaired Eating/Feeding: Set up;Sitting   Grooming: Set up;Sitting;Standing;Cueing for safety   Upper Body Bathing: Sitting;Minimal assitance   Lower Body Bathing: Sit to/from stand;With adaptive equipment;Moderate assistance   Upper Body Dressing : Minimal assistance;Sitting   Lower Body Dressing: Moderate assistance;Sit to/from stand   Toilet Transfer: Min guard;Ambulation;RW;BSC   Toileting- Clothing Manipulation and Hygiene: Minimal assistance;Sit to/from stand;Cueing for safety   Tub/ Shower Transfer: Minimal assistance;Ambulation;Shower seat   Functional mobility during ADLs: Min guard;Rolling walker;Cueing for safety General ADL Comments: Pt ambulated min guard; needed mod A for suoine<>EOB. Generalized weakness impacting ADL independence. Educated pt and cg on AE for LB dressing, walker safety, energy conservation techniques. Recommended to pt and cg to sit to bathe and dress.     Vision  Perception     Praxis      Pertinent Vitals/Pain No c/o pain.     Hand Dominance Right   Extremity/Trunk Assessment Upper Extremity Assessment Upper Extremity Assessment: Generalized weakness   Lower Extremity Assessment Lower Extremity Assessment: Defer to PT evaluation       Communication Communication Communication: HOH   Cognition Arousal/Alertness: Awake/alert Behavior During  Therapy: WFL for tasks assessed/performed Overall Cognitive Status: Within Functional Limits for tasks assessed Area of Impairment: Problem solving             Problem Solving: Slow processing;Requires verbal cues     General Comments       Exercises       Shoulder Instructions      Home Living Family/patient expects to be discharged to:: Private residence Living Arrangements: Spouse/significant other Available Help at Discharge: Personal care attendant;Other (Comment) (during the day) Type of Home: House Home Access: Stairs to enter [employer] of Steps: 3 Entrance Stairs-Rails: Can reach both Home Layout: Two level Alternate Level Stairs-Number of Steps: flight Alternate Level Stairs-Rails: Can reach both Bathroom Shower/Tub: Tub/shower unit Shower/tub characteristics: Door Biochemist, clinical: Standard     Home Equipment: Environmental consultant - 2 wheels;Bedside commode;Shower seat;Grab bars - toilet;Grab bars - tub/shower;Wheelchair - manual   Additional Comments: aide helps with househould tasks, lays out clothes, etc.; able to assist with basic ADLs if needed      Prior Functioning/Environment Level of Independence: Needs assistance    ADL's / Homemaking Assistance Needed: aide helps with household tasks, setup for ADLs        OT Diagnosis: Generalized weakness   OT Problem List: Decreased strength;Decreased activity tolerance;Impaired balance (sitting and/or standing);Decreased safety awareness;Decreased knowledge of use of DME or AE;Decreased knowledge of precautions   OT Treatment/Interventions: Self-care/ADL training;Therapeutic exercise;Energy conservation;DME and/or AE instruction;Therapeutic activities;Patient/family education;Balance training    OT Goals(Current goals can be found in the care plan section) Acute Rehab OT Goals Patient Stated Goal: not stated OT Goal Formulation: With patient/family Time For Goal Achievement: 02/28/14 Potential to  Achieve Goals: Good ADL Goals Pt Will Perform Upper Body Bathing: with set-up;sitting;with adaptive equipment;with supervision Pt Will Perform Lower Body Bathing: with supervision;with set-up;with adaptive equipment;sit to/from stand Pt Will Perform Upper Body Dressing: sitting;with set-up Pt Will Perform Lower Body Dressing: with supervision;with adaptive equipment;sit to/from stand Pt Will Transfer to Toilet: with supervision;ambulating;regular height toilet Pt Will Perform Toileting - Clothing Manipulation and hygiene: with supervision;sit to/from stand Pt Will Perform Tub/Shower Transfer: with supervision;ambulating;shower seat;grab bars;rolling walker  OT Frequency: Min 2X/week   Barriers to D/C:            Co-evaluation              End of Session Equipment Utilized During Treatment: Gait belt;Rolling walker Nurse Communication: Other (comment) (vitals)  Activity Tolerance: Patient limited by fatigue Patient left: in bed;with nursing/sitter in room;with family/visitor present;with call bell/phone within reach   Time: 1358-1427 OT Time Calculation (min): 29 min Charges:  OT General Charges $OT Visit: 1 Procedure OT Evaluation $Initial OT Evaluation Tier I: 1 Procedure OT Treatments $Self Care/Home Management : 8-22 mins G-Codes:    Hortencia Pilar 03/08/14, 2:52 PM   Vitals assessed prior to session, bp 143/65, hr 60

## 2014-02-21 NOTE — Code Documentation (Addendum)
80 y o male who was at home with a CNA when she noticed at 1100 R facial droop and leaning to the right. He had an event earlier in the morning when EMS was called for "not feeling well," but declined being transported to the hospital. With this second event, EMS transported him arriving at 1313. He is alert, seems to "drift off" when NIHSS was performed. No true focal signs noted..he is unable to lift BLE off the bed, and says he cannot walk at baseline. He spends most of the day in a chair per pt. He was not incontinent, but does admit to urinary frequency. He is afebrile. Speech is sl slurred, but seems mostly due to dry mouth. The stroke team did not appreciate any R facial droop, but he does seem to have a sl flattened L nasolabial fold. He can name objects, has difficulty reading the sentences of the NIHSS, and needs stimulation to stay on task. He is unable to describe things in the picture. Generally, no new focal signs noted and code stroke was cancelled. Hand off done with ED nuirse.

## 2014-02-21 NOTE — Evaluation (Signed)
Clinical/Bedside Swallow Evaluation Patient Details  Name: Barry Wright MRN: [id number] Date of Birth: 1920/08/07  Today's Date: 02/21/2014 Time: 1950-9326 SLP Time Calculation (min): 25 min  Past Medical History:  Past Medical History  Diagnosis Date  . Chronic back pain   . Hypercholesteremia   . Hypothyroidism   . CAD (coronary artery disease)   . Chronic kidney disease (CKD), stage III (moderate)   . Polymyalgia rheumatica   . Left bundle branch block   . Hx of CABG   . History of nephrectomy   . Internal carotid artery stenosis   . Myocardial infarction   . CHF (congestive heart failure)   . Stroke    Past Surgical History:  Past Surgical History  Procedure Laterality Date  . Coronary artery bypass graft    . Cholecystectomy    . Kidney surgery      kidney removed   HPI:  Pt's caregiver noted initial c/o right-sided facial weakness upon arrival to ED; pt with pmhx significant for TIA, hyperlipidemia, hypothyroidism, spinal stenosis(per pt), CAD, CABG, internal carotid artery stenosis; CT on 02/20/14 revealed no acute abnormality; MRI pending   Assessment / Plan / Recommendation Clinical Impression  Pt without overt s/s of aspiration with any consistency; normal oropharyngeal swallow noted; no f/u recommended from ST; regular diet/thin liquids with general swallowing precautions recommended    Aspiration Risk  None    Diet Recommendation Regular;Thin liquid   Liquid Administration via: Cup;Straw Medication Administration: Whole meds with liquid Supervision: Patient able to self feed Compensations: Slow rate;Small sips/bites    Other  Recommendations Oral Care Recommendations: Oral care BID   Follow Up Recommendations  None    Frequency and Duration   Eval only     Pertinent Vitals/Pain WDL    SLP Swallow Goals  n/a   Swallow Study Prior Functional Status  Type of Home: House Available Help at Discharge: Personal care attendant    General Date  of Onset: 02/20/14 HPI: Pt's caregiver noted initial c/o right-sided facial weakness upon arrival to ED; pt with pmhx significant for TIA, hyperlipidemia, hypothyroidism, spinal stenosis(per pt), CAD, CABG, internal carotid artery stenosis; CT on 02/20/14 revealed no acute abnormality; MRI pending; pt stated he "had two breakfasts this morning" prior to SLP arrival Type of Study: Bedside swallow evaluation Diet Prior to this Study: Regular;Other (Comment) (heart healthy) Temperature Spikes Noted: No Respiratory Status: Room air Behavior/Cognition: Alert;Cooperative;Pleasant mood;Hard of hearing Oral Cavity - Dentition: Adequate natural dentition Self-Feeding Abilities: Able to feed self Patient Positioning: Upright in chair Baseline Vocal Quality: Clear Volitional Cough: Strong Volitional Swallow: Able to elicit    Oral/Motor/Sensory Function Overall Oral Motor/Sensory Function: Appears within functional limits for tasks assessed Labial ROM: Within Functional Limits Labial Symmetry: Within Functional Limits Labial Strength: Within Functional Limits Labial Sensation: Within Functional Limits Lingual ROM: Within Functional Limits Lingual Symmetry: Within Functional Limits Lingual Strength: Within Functional Limits Lingual Sensation: Within Functional Limits Facial ROM: Within Functional Limits Facial Symmetry: Within Functional Limits Facial Strength: Within Functional Limits Facial Sensation: Within Functional Limits Velum: Within Functional Limits Mandible: Within Functional Limits   Ice Chips Ice chips: Not tested   Thin Liquid Thin Liquid: Within functional limits Presentation: Cup    Nectar Thick Nectar Thick Liquid: Not tested   Honey Thick Honey Thick Liquid: Not tested   Puree Puree: Within functional limits Presentation: Self Fed   Solid       Solid: Within functional limits Presentation: Self Fed  Drue Novel, CCC-SLP 02/21/2014,12:01 PM

## 2014-02-21 NOTE — Evaluation (Signed)
Physical Therapy Evaluation Patient Details Name: Barry Wright MRN: [id number] DOB: May 10, 1920 Today's Date: 02/21/2014   History of Present Illness  Barry Wright is an 80 y.o. male who was seen this AM and family noted he was not acting himself.  EMS was called to the house and patient had refused transport to hospital.  At 11 AM CNA who cares for the patient noted he was leaning to the right and had possible right facial asymmetry.  EMS was again called to the house. On this arrival patient was brought to Hospital for possible code stroke. On arrival patient is very drowsy, diffusely weak in the LE and unable to give date.  He would follow simple commands.  He showed no focal or lateralizing symptoms. Patient also with N/V/D and dehydration per chart.  Clinical Impression  Patient demonstrates deficits in mobility as indicated below. Patient will benefit from continued skilled PT to address deficits and maximize function. Will see as indicated and progress as tolerated. Anticipate patient will continue to progress with mobility.  OF NOTE patient does report back pain    Follow Up Recommendations Home health PT;Supervision/Assistance - 24 hour    Equipment Recommendations  None recommended by PT    Recommendations for Other Services       Precautions / Restrictions Precautions Precautions: Fall      Mobility  Bed Mobility Overal bed mobility: Needs Assistance Bed Mobility: Supine to Sit     Supine to sit: Min assist     General bed mobility comments: increased time to perform, assist to come to EOB, difficulty scooting secondary to generalized weakness   Transfers Overall transfer level: Needs assistance Equipment used: Rolling walker (2 wheeled) Transfers: Sit to/from Stand Sit to Stand: Min assist         General transfer comment: Min assist for stability and to elevate to standing from raised bed. Good awareness of need to reposition and scoot further to  EOB  Ambulation/Gait Ambulation/Gait assistance: Min guard Ambulation Distance (Feet): 24 Feet Assistive device: Rolling walker (2 wheeled) Gait Pattern/deviations: Shuffle;Decreased stride length;Trunk flexed;Narrow base of support;Festinating;Step-to pattern Gait velocity: decreased Gait velocity interpretation: <1.8 ft/sec, indicative of risk for recurrent falls General Gait Details: Max Cues for BIG steps and upright posture (patient more steady when taking bigger steps) when patient festinating, patient begains to let the RW get too far out in front. Cued for position within RW. Responds well to VCs  Stairs            Wheelchair Mobility    Modified Rankin (Stroke Patients Only)       Balance Overall balance assessment: Needs assistance Sitting-balance support: Feet supported Sitting balance-Leahy Scale: Good     Standing balance support: Bilateral upper extremity supported;During functional activity Standing balance-Leahy Scale: Fair                               Pertinent Vitals/Pain 4/10 back pain at this time    Home Living Family/patient expects to be discharged to:: Private residence Living Arrangements: Spouse/significant other Available Help at Discharge: Personal care attendant Type of Home: House Home Access: Stairs to enter Entrance Stairs-Rails: Can reach both Entrance Stairs-Number of Steps: 3 Home Layout: Two level Home Equipment: [place] - 2 wheels;Bedside commode;Shower seat;Grab bars - toilet;Grab bars - tub/shower;Wheelchair - manual Additional Comments: continues to have aid 8 hours a day 7 days a week, aid helps take him  and wife out to eat at various restaraunts every day, aide also helps with things around the house    Prior Function Level of Independence: Needs assistance   Gait / Transfers Assistance Needed: supervision with use of RW     Comments: reports that he is usually able to clib stairs with caution, has a RW  upstairs and downstairs     Hand Dominance   Dominant Hand: Right    Extremity/Trunk Assessment   Upper Extremity Assessment: Defer to OT evaluation           Lower Extremity Assessment: Generalized weakness         Communication   Communication: HOH  Cognition Arousal/Alertness: Awake/alert Behavior During Therapy: WFL for tasks assessed/performed Overall Cognitive Status: Within Functional Limits for tasks assessed Area of Impairment: Problem solving             Problem Solving: Slow processing;Requires verbal cues      General Comments      Exercises        Assessment/Plan    PT Assessment Patient needs continued PT services  PT Diagnosis Difficulty walking;Abnormality of gait;Generalized weakness;Acute pain   PT Problem List Decreased strength;Decreased activity tolerance;Decreased balance;Decreased mobility;Pain  PT Treatment Interventions DME instruction;Gait training;Stair training;Functional mobility training;Therapeutic activities;Therapeutic exercise;Balance training;Patient/family education   PT Goals (Current goals can be found in the Care Plan section) Acute Rehab PT Goals Patient Stated Goal: to get back home and go back to restaraunts PT Goal Formulation: With patient Time For Goal Achievement: 03/07/14 Potential to Achieve Goals: Good    Frequency Min 4X/week   Barriers to discharge        Co-evaluation               End of Session            Functional Assessment Tool Used: clinical judgement Functional Limitation: Mobility: Walking and moving around Mobility: Walking and Moving Around Current Status ([id number]): At least 1 percent but less than 20 percent impaired, limited or restricted Mobility: Walking and Moving Around Goal Status [phone]): At least 20 percent but less than 40 percent impaired, limited or restricted    Time: 0938-1003 PT Time Calculation (min): 25 min   Charges:   PT Evaluation $Initial PT  Evaluation Tier I: 1 Procedure PT Treatments $Therapeutic Activity: 8-22 mins   PT G Codes:   Functional Assessment Tool Used: clinical judgement Functional Limitation: Mobility: Walking and moving around    [employer] 02/21/2014, 10:18 AM Alben Deeds, PT DPT  [phone]

## 2014-02-21 NOTE — Progress Notes (Signed)
Subjective: Patient is feeling much better today. He is alert and able to walk with walker (baseline). Wife at bedside states he is back to his baseline.   Objective: Current vital signs: BP 119/53  Pulse 60  Temp(Src) 97.9 F (36.6 C) (Oral)  Resp 18  Ht 5\' 10"  (1.778 m)  Wt 85.5 kg (188 lb 7.9 oz)  BMI 27.05 kg/m2  SpO2 96% Vital signs in last 24 hours: Temp:  [97.6 F (36.4 C)-99.2 F (37.3 C)] 97.9 F (36.6 C) (04/14 0948) Pulse Rate:  [60-86] 60 (04/14 0948) Resp:  [16-31] 18 (04/14 0948) BP: (113-141)/(44-66) 119/53 mmHg (04/14 0948) SpO2:  [95 %-100 %] 96 % (04/14 0948) Weight:  [85.5 kg (188 lb 7.9 oz)-89 kg (196 lb 3.4 oz)] 85.5 kg (188 lb 7.9 oz) (04/14 0500)  Intake/Output from previous day: 04/13 0701 - 04/14 0700 In: -  Out: 400 [Urine:400] Intake/Output this shift: Total I/O In: 240 [P.O.:240] Out: -  Nutritional status: Cardiac  Neurologic Exam: Mental Status: Alert, oriented, thought content appropriate.  Speech fluent without evidence of aphasia.  Able to follow 3 step commands without difficulty. Cranial Nerves: WU:XLKGMW fields grossly normal, pupils equal, round, reactive to light and accommodation III,IV, VI: ptosis not present, extra-ocular motions intact bilaterally V,VII: smile symmetric, facial light touch sensation normal bilaterally VIII: hearing normal bilaterally IX,X: gag reflex present XI: bilateral shoulder shrug XII: midline tongue extension without atrophy or fasciculations  Motor: Right : Upper extremity   5/5    Left:     Upper extremity   5/5  Lower extremity   5/5     Lower extremity   5/5 Tone and bulk:normal tone throughout; no atrophy noted Sensory: Pinprick and light touch intact throughout, bilaterally Deep Tendon Reflexes:  Right: Upper Extremity   Left: Upper extremity   biceps (C-5 to C-6) 2/4   biceps (C-5 to C-6) 2/4 tricep (C7) 2/4    triceps (C7) 2/4 Brachioradialis (C6) 2/4  Brachioradialis (C6) 2/4  Lower  Extremity Lower Extremity  quadriceps (L-2 to L-4) 0/4   quadriceps (L-2 to L-4) 0/4 Achilles (S1) 0/4   Achilles (S1) 0/4  Plantars: Right: downgoing   Left: downgoing Cerebellar: normal finger-to-nose,  normal heel-to-shin test Gait: uses walker but stable.     Lab Results: Basic Metabolic Panel:  Recent Labs Lab 02/20/14 1316 02/20/14 1330 02/20/14 1708 02/21/14 0606  NA 140 139  --  140  K 4.7 4.4  --  3.7  CL 100 100  --  103  CO2 27  --   --  24  GLUCOSE 113* 112*  --  95  BUN 29* 39*  --  26*  CREATININE 1.41* 1.40* 1.47* 1.44*  CALCIUM 8.8  --   --  8.0*  MG  --   --  1.5  --     Liver Function Tests:  Recent Labs Lab 02/20/14 1316  AST 34  ALT 18  ALKPHOS 55  BILITOT 0.5  PROT 5.7*  ALBUMIN 3.1*   No results found for this basename: LIPASE, AMYLASE,  in the last 168 hours No results found for this basename: AMMONIA,  in the last 168 hours  CBC:  Recent Labs Lab 02/20/14 1316 02/20/14 1330 02/20/14 1708 02/21/14 0606  WBC 8.6  --  8.1 6.3  NEUTROABS 7.2  --   --   --   HGB 13.6 14.3 12.6* 12.2*  HCT 39.8 42.0 37.5* 35.6*  MCV 98.5  --  100.0  99.4  PLT 145*  --  122* 107*    Cardiac Enzymes: No results found for this basename: CKTOTAL, CKMB, CKMBINDEX, TROPONINI,  in the last 168 hours  Lipid Panel: No results found for this basename: CHOL, TRIG, HDL, CHOLHDL, VLDL, LDLCALC,  in the last 168 hours  CBG: No results found for this basename: GLUCAP,  in the last 168 hours  Microbiology: Results for orders placed during the hospital encounter of 02/20/14  URINE CULTURE     Status: None   Collection Time    02/20/14  2:55 PM      Result Value Ref Range Status   Specimen Description URINE, RANDOM   Final   Special Requests ADDED AT [place] [id number]   Final   Culture  Setup Time     Final   Value: 02/20/2014 19:16     Performed at [facility]     Final   Value: NO GROWTH     Performed at [facility]    Culture     Final   Value: NO GROWTH     Performed at [facility]   Report Status 02/21/2014 FINAL   Final    Coagulation Studies:  Recent Labs  02/20/14 1316  LABPROT 12.8  INR 0.98    Imaging: Ct Head Wo Contrast  02/20/2014   CLINICAL DATA:  Acute onset right facial weakness.  Code stroke.  EXAM: CT HEAD WITHOUT CONTRAST  TECHNIQUE: Contiguous axial images were obtained from the base of the skull through the vertex without intravenous contrast.  COMPARISON:  12/29/12  FINDINGS: There is no evidence of intracranial hemorrhage, brain edema, or other signs of acute infarction. There is no evidence of intracranial mass lesion or mass effect. No abnormal extraaxial fluid collections are identified.  Moderate cerebral atrophy and chronic small vessel disease is stable in appearance. Old left basal ganglia 10 again noted. Ventricles are stable in size. No evidence of skull fracture or other bone lesion.  IMPRESSION: No acute intracranial abnormality.  Stable cerebral atrophy and chronic small vessel disease.  These results were called by telephone at the time of interpretation on 02/20/2014 at 1:31 PM to Dr. Alexis Goodell, who verbally acknowledged these results.   Electronically Signed   By: Earle Gell M.D.   On: 02/20/2014 13:37   Dg Abd Acute W/chest  02/20/2014   CLINICAL DATA:  Nausea and vomiting.  Weakness.  EXAM: ACUTE ABDOMEN SERIES (ABDOMEN 2 VIEW & CHEST 1 VIEW)  COMPARISON:  01/03/2013  FINDINGS: Bowel gas pattern is normal without evidence of ileus, obstruction or free air. Clips in the right upper quadrant indicate previous cholecystectomy. There is curvature in degenerative change of the spine.  One-view chest shows previous median sternotomy and CABG. Heart size is at the upper limits of normal. Calcification of the aorta up. The lungs are clear. The vascularity is normal. No effusions.  IMPRESSION: Negative acute abdominal series. No finding to explain the presenting  symptoms.   Electronically Signed   By: Nelson Chimes M.D.   On: 02/20/2014 19:42    Medications:  Scheduled: . amLODipine  2.5 mg Oral Daily  . aspirin EC  81 mg Oral Daily  . atorvastatin  20 mg Oral Daily  . clopidogrel  75 mg Oral Daily  . doxazosin  4 mg Oral QHS  . enoxaparin (LOVENOX) injection  40 mg Subcutaneous Q24H  . levothyroxine  75 mcg Oral QAC breakfast  .  ondansetron (ZOFRAN) IV  4 mg Intravenous Once  . pantoprazole  40 mg Oral Q0600  . predniSONE  5 mg Oral Daily  . sodium chloride  3 mL Intravenous Q12H  . traMADol  50 mg Oral BID    Assessment/Plan: Patient much improved.  At baseline.  Remains on ASA and Plavix.  Head CT unremarkable.  Presentation likely secondary to dehydration.  MRI pending.  Recommendations: 1.  Will f/u MRI results.  If no evidence of acute infarct, further neurologic w/u not indicated.     LOS: 1 day   @LRSIGN @ 02/21/2014  12:49 PM

## 2014-02-21 NOTE — Progress Notes (Signed)
   CARE MANAGEMENT NOTE 02/21/2014  Patient:  Barry Wright, Barry Wright   Account Number:  [id number]  Date Initiated:  02/21/2014  Documentation initiated by:  Olga Coaster  Subjective/Objective Assessment:   ADMITTED WITH WEAKNESS/ TIA WORK UP     Action/Plan:   CM FOLLOWING FOR DCP   Anticipated DC Date:  02/24/2014   Anticipated DC Plan:  [place]; PATIENT HAS PRIVATE CAREGIVERS AT [place]  CM consult        Status of service:  In process, will continue to follow Medicare Important Message given?  NA - LOS <3 / Initial given by admissions (If response is "NO", the following Medicare IM given date fields will be blank)  Per UR Regulation:  Reviewed for med. necessity/level of care/duration of stay  Comments:  4/14/2015Mindi Slicker RN,BSN,MHA [phone]

## 2014-02-21 NOTE — Progress Notes (Signed)
Assessment/Plan: Principal Problem:   Volume depletion, gastrointestinal loss - he is volume depleted and that persists after some gentle rehydration. He is still very weak. Will continue IVF for now. PT and OT to see patient. He uses walker at home at baseline and he has 8 hours of nursing care daily.  Active Problems:   Weakness - due to volume depletion. It is not improving so far.    Chronic kidney disease (CKD), stage III (moderate)   Polymyalgia rheumatica   Carotid artery disease   Coronary artery disease   Nausea & vomiting - this was cause of the volume depletion. Seems better but will advance diet to see.    Subjective: Patient still feels poorly. Although not nauseated, he feels very weak and can barely lift head off of bed. Denies lateralizing weakness. Had 1-2 diarrheal stools, now better.   Objective:  Vital Signs: Filed Vitals:   02/20/14 2118 02/21/14 0201 02/21/14 0500 02/21/14 0530  BP: 138/50 128/44  125/53  Pulse: 71 71  65  Temp: 98.2 F (36.8 C) 97.7 F (36.5 C)  97.6 F (36.4 C)  TempSrc: Oral Oral  Oral  Resp: 22 20  16   Height:      Weight:   85.5 kg (188 lb 7.9 oz)   SpO2: 95% 95%  96%     EXAM: Weak, frail. Decreased skin turgor  Motor 5-/5 throughout. Unable to get him up out of bed.    Intake/Output Summary (Last 24 hours) at 02/21/14 0720 Last data filed at 02/21/14 0532  Gross per 24 hour  Intake      0 ml  Output    400 ml  Net   -400 ml    Lab Results:  Recent Labs  02/20/14 1316 02/20/14 1330 02/20/14 1708 02/21/14 0606  NA 140 139  --  140  K 4.7 4.4  --  3.7  CL 100 100  --  103  CO2 27  --   --  24  GLUCOSE 113* 112*  --  95  BUN 29* 39*  --  26*  CREATININE 1.41* 1.40* 1.47* 1.44*  CALCIUM 8.8  --   --  8.0*  MG  --   --  1.5  --     Recent Labs  02/20/14 1316  AST 34  ALT 18  ALKPHOS 55  BILITOT 0.5  PROT 5.7*  ALBUMIN 3.1*   No results found for this basename: LIPASE, AMYLASE,  in the last 72  hours  Recent Labs  02/20/14 1316  02/20/14 1708 02/21/14 0606  WBC 8.6  --  8.1 6.3  NEUTROABS 7.2  --   --   --   HGB 13.6  < > 12.6* 12.2*  HCT 39.8  < > 37.5* 35.6*  MCV 98.5  --  100.0 99.4  PLT 145*  --  122* PENDING  < > = values in this interval not displayed. No results found for this basename: CKTOTAL, CKMB, CKMBINDEX, TROPONINI,  in the last 72 hours BNP No results found for this basename: probnp   No results found for this basename: DDIMER,  in the last 72 hours No results found for this basename: HGBA1C,  in the last 72 hours No results found for this basename: CHOL, HDL, LDLCALC, TRIG, CHOLHDL, LDLDIRECT,  in the last 72 hours  Recent Labs  02/20/14 1828  TSH 2.350   No results found for this basename: VITAMINB12, FOLATE, FERRITIN, TIBC, IRON, RETICCTPCT,  in the  last 72 hours  Studies/Results: Ct Head Wo Contrast  02/20/2014   CLINICAL DATA:  Acute onset right facial weakness.  Code stroke.  EXAM: CT HEAD WITHOUT CONTRAST  TECHNIQUE: Contiguous axial images were obtained from the base of the skull through the vertex without intravenous contrast.  COMPARISON:  12/29/12  FINDINGS: There is no evidence of intracranial hemorrhage, brain edema, or other signs of acute infarction. There is no evidence of intracranial mass lesion or mass effect. No abnormal extraaxial fluid collections are identified.  Moderate cerebral atrophy and chronic small vessel disease is stable in appearance. Old left basal ganglia 10 again noted. Ventricles are stable in size. No evidence of skull fracture or other bone lesion.  IMPRESSION: No acute intracranial abnormality.  Stable cerebral atrophy and chronic small vessel disease.  These results were called by telephone at the time of interpretation on 02/20/2014 at 1:31 PM to Dr. Alexis Goodell, who verbally acknowledged these results.   Electronically Signed   By: Earle Gell M.D.   On: 02/20/2014 13:37   Dg Abd Acute W/chest  02/20/2014    CLINICAL DATA:  Nausea and vomiting.  Weakness.  EXAM: ACUTE ABDOMEN SERIES (ABDOMEN 2 VIEW & CHEST 1 VIEW)  COMPARISON:  01/03/2013  FINDINGS: Bowel gas pattern is normal without evidence of ileus, obstruction or free air. Clips in the right upper quadrant indicate previous cholecystectomy. There is curvature in degenerative change of the spine.  One-view chest shows previous median sternotomy and CABG. Heart size is at the upper limits of normal. Calcification of the aorta up. The lungs are clear. The vascularity is normal. No effusions.  IMPRESSION: Negative acute abdominal series. No finding to explain the presenting symptoms.   Electronically Signed   By: Nelson Chimes M.D.   On: 02/20/2014 19:42   Medications: Medications administered in the last 24 hours reviewed.  Current Medication List reviewed.    LOS: 1 day   Tyrone Schimke Internal Medicine @ [facility] [phone]) 02/21/2014, 7:20 AM

## 2014-02-22 DIAGNOSIS — E441 Mild protein-calorie malnutrition: Secondary | ICD-10-CM | POA: Diagnosis present

## 2014-02-22 LAB — COMPREHENSIVE METABOLIC PANEL
ALT: 16 U/L (ref 0–53)
AST: 25 U/L (ref 0–37)
Albumin: 2.4 g/dL — ABNORMAL LOW (ref 3.5–5.2)
Alkaline Phosphatase: 63 U/L (ref 39–117)
BUN: 24 mg/dL — ABNORMAL HIGH (ref 6–23)
CO2: 23 mEq/L (ref 19–32)
Calcium: 7.8 mg/dL — ABNORMAL LOW (ref 8.4–10.5)
Chloride: 105 mEq/L (ref 96–112)
Creatinine, Ser: 1.34 mg/dL (ref 0.50–1.35)
GFR calc Af Amer: 52 mL/min — ABNORMAL LOW (ref >90)
GFR calc non Af Amer: 45 mL/min — ABNORMAL LOW (ref >90)
Glucose, Bld: 113 mg/dL — ABNORMAL HIGH (ref 70–99)
Potassium: 3.8 mEq/L (ref 3.7–5.3)
Sodium: 141 mEq/L (ref 137–147)
Total Bilirubin: 0.3 mg/dL (ref 0.3–1.2)
Total Protein: 4.8 g/dL — ABNORMAL LOW (ref 6.0–8.3)

## 2014-02-22 LAB — CBC
HCT: 35.1 % — ABNORMAL LOW (ref 39.0–52.0)
Hemoglobin: 11.9 g/dL — ABNORMAL LOW (ref 13.0–17.0)
MCH: 34 pg (ref 26.0–34.0)
MCHC: 33.9 g/dL (ref 30.0–36.0)
MCV: 100.3 fL — ABNORMAL HIGH (ref 78.0–100.0)
Platelets: 104 10*3/uL — ABNORMAL LOW (ref 150–400)
RBC: 3.5 MIL/uL — ABNORMAL LOW (ref 4.22–5.81)
RDW: 13.3 % (ref 11.5–15.5)
WBC: 7.7 10*3/uL (ref 4.0–10.5)

## 2014-02-22 NOTE — Progress Notes (Signed)
Patient discharged prior to [facility] consultation with pt/ spouse; Percival Spanish RN stated that the pt/ spouse wanted [facility] services; Dr Maxwell Caul called - he will make the arrangements for [facility] services from the office; Mindi Slicker RN,BSN,MHA [phone]

## 2014-02-22 NOTE — Progress Notes (Signed)
Pt discharged home  Discharge and follow up visit discussed with pt and spouse  both verbalized good understanding.  Pt left unit with wife and private care giver .Condition at discharge was stable.

## 2014-02-22 NOTE — Progress Notes (Signed)
Occupational Therapy Treatment Patient Details Name: MOUA RASMUSSON MRN: [id number] DOB: 1920/01/27 Today's Date: 02/22/2014    History of present illness RENE SIZELOVE is an 80 y.o. male who was seen this AM and family noted he was not acting himself.  EMS was called to the house and patient had refused transport to hospital.  At 11 AM CNA who cares for the patient noted he was leaning to the right and had possible right facial asymmetry.  EMS was again called to the house. On this arrival patient was brought to Hospital for possible code stroke. On arrival patient is very drowsy, diffusely weak in the LE and unable to give date.  He would follow simple commands.  He showed no focal or lateralizing symptoms. Patient also with N/V/D and dehydration per chart.   OT comments  Pt progressing toward acute OT goals. Supine to EOB min A. Min A sit>stand. Pt able to access feet better today but still struggling with doffing socks. Educated on AE for LB dressing. Educated on shower transfers with pt able to lift feet up one at a time for tub clearance min guard using grab bars to stabilize.  Follow Up Recommendations       Equipment Recommendations       Recommendations for Other Services      Precautions / Restrictions Precautions Precautions: Fall       Mobility Bed Mobility Overal bed mobility: Needs Assistance Bed Mobility: Supine to Sit     Supine to sit: Min assist     General bed mobility comments: min A supine>EOB, cueing and min A for scooting hips into correct alignment EOB  Transfers Overall transfer level: Needs assistance Equipment used: Rolling walker (2 wheeled) Transfers: Sit to/from Stand Sit to Stand: Min assist         General transfer comment: min A and cues for technique    Balance Overall balance assessment: Needs assistance Sitting-balance support: Feet supported Sitting balance-Leahy Scale: Good     Standing balance support: Bilateral upper  extremity supported;During functional activity Standing balance-Leahy Scale: Fair Standing balance comment: cues to stand up straight                   ADL                       Lower Body Dressing: Minimal assistance;Sit to/from stand           Tub/ Shower Transfer: Min guard;Ambulation;Shower seat   Functional mobility during ADLs: Min guard;Rolling walker General ADL Comments: Pt able to access feet better today needing min A. Demonstrated to pt and caregivers use of reacher and technique for LB dressing. Educated on shower transfer, pt able to lift feet up one at a time for clearance during shower transfer with min guard A.       Vision                     Perception     Praxis      Cognition   Behavior During Therapy: [facility] for tasks assessed/performed Overall Cognitive Status: Within Functional Limits for tasks assessed Area of Impairment: Problem solving              Problem Solving: Slow processing;Requires verbal cues      Extremity/Trunk Assessment               Exercises     Shoulder Instructions  General Comments      Pertinent Vitals/ Pain       No c/o pain.  Home Living                                          Prior Functioning/Environment              Frequency Min 2X/week     Progress Toward Goals  OT Goals(current goals can now be found in the care plan section)  Progress towards OT goals: Progressing toward goals  Acute Rehab OT Goals OT Goal Formulation: With patient/family Time For Goal Achievement: 02/28/14 Potential to Achieve Goals: Good ADL Goals Pt Will Perform Upper Body Bathing: with set-up;sitting;with adaptive equipment;with supervision Pt Will Perform Lower Body Bathing: with supervision;with set-up;with adaptive equipment;sit to/from stand Pt Will Perform Upper Body Dressing: sitting;with set-up Pt Will Perform Lower Body Dressing: with supervision;with  adaptive equipment;sit to/from stand Pt Will Transfer to Toilet: with supervision;ambulating;regular height toilet Pt Will Perform Toileting - Clothing Manipulation and hygiene: with supervision;sit to/from stand Pt Will Perform Tub/Shower Transfer: with supervision;ambulating;shower seat;grab bars;rolling walker  Plan Discharge plan remains appropriate    Co-evaluation                 End of Session Equipment Utilized During Treatment: Gait belt;Rolling walker   Activity Tolerance Patient limited by fatigue   Patient Left in bed;with bed alarm set;with call bell/phone within reach;with family/visitor present   Nurse Communication          Time: 2330-0762 OT Time Calculation (min): 23 min  Charges: OT General Charges $OT Visit: 1 Procedure OT Evaluation $Initial OT Evaluation Tier I: 1 Procedure OT Treatments $Self Care/Home Management : 23-37 mins  Hortencia Pilar 02/22/2014, 3:03 PM

## 2014-02-22 NOTE — Discharge Summary (Signed)
Physician Discharge Summary  NAME:Barry Wright  HWE:[id number]  DOB: Jan 14, 1920   Admit date: 02/20/2014 Discharge date: 02/22/2014  Admitting Diagnosis: dehydration  Discharge Diagnoses:  Active Hospital Problems   Diagnosis Date Noted  . Volume depletion, gastrointestinal loss 02/21/2014  . Mild malnutrition 02/22/2014  . Nausea & vomiting 02/20/2014  . Weakness 01/03/2013  . Chronic kidney disease (CKD), stage III (moderate) 01/03/2013  . Coronary artery disease 01/03/2013  . Polymyalgia rheumatica 01/03/2013  . Carotid artery disease 01/03/2013    Resolved Hospital Problems   Diagnosis Date Noted Date Resolved  No resolved problems to display.    Things to follow up in the outpatient setting: recheck weight.   Hospital Course: Patient was admitted. His N/V had resolved and he needed no specific treatment for this. We discovered that he had lost about 10 pounds (prior to getting sick) and his albumin was 2.4. Along with temporal wasting, he was found to be malnourished. In concert with the acute gastroenteritis, this malnutrition led to weakness, inability to walk and transient worsening in CKD.  All improved with IVF. He was able to eat and ambulate at his baseline. Brain MRI showed evidence of small vessel disease that is slowly progressive. This was discussed with the patient.   Discharge Condition: improved  Consults: neurology  Disposition: 01-Home or Self Care  Discharge Orders   Future Orders Complete By Expires   Diet - low sodium heart healthy  As directed    Increase activity slowly  As directed        Medication List         acetaminophen 500 MG tablet  Commonly known as:  TYLENOL  Take 1,000 mg by mouth every 8 (eight) hours as needed for pain.     amLODipine 2.5 MG tablet  Commonly known as:  NORVASC  Take 2.5 mg by mouth daily.     aspirin 81 MG tablet  Take 81 mg by mouth daily.     atorvastatin 20 MG tablet  Commonly known as:  LIPITOR   Take 20 mg by mouth daily.     clopidogrel 75 MG tablet  Commonly known as:  PLAVIX  Take 75 mg by mouth daily.     doxazosin 4 MG tablet  Commonly known as:  CARDURA  Take 4 mg by mouth at bedtime.     gabapentin 100 MG capsule  Commonly known as:  NEURONTIN  Take 200 mg by mouth at bedtime.     levothyroxine 75 MCG tablet  Commonly known as:  SYNTHROID, LEVOTHROID  Take 75 mcg by mouth daily.     multivitamins ther. w/minerals Tabs tablet  Take 1 tablet by mouth daily.     nitroGLYCERIN 0.4 MG SL tablet  Commonly known as:  NITROSTAT  Place 0.4 mg under the tongue every 5 (five) minutes as needed for chest pain.     polyethylene glycol packet  Commonly known as:  MIRALAX / GLYCOLAX  Take 17 g by mouth daily.     predniSONE 5 MG tablet  Commonly known as:  DELTASONE  Take 5 mg by mouth daily.     traMADol 50 MG tablet  Commonly known as:  ULTRAM  Take 50 mg by mouth 2 (two) times daily.           Follow-up Information   Follow up with Horton Finer, MD. (We will contact you for a followup appointment)    Specialty:  Internal Medicine   Contact information:  [address], [place] [address] [phone]       Time coordinating discharge: 20 minutes including medication reconciliation,  preparation of discharge papers, and discussion with patient     Signed: Horton Finer 02/22/2014, 7:53 AM

## 2014-02-22 NOTE — Progress Notes (Signed)
Physical Therapy Treatment Patient Details Name: Barry Wright MRN: [id number] DOB: Jun 28, 1920 Today's Date: 02/22/2014    History of Present Illness Barry Wright is an 80 y.o. male who was seen this AM and family noted he was not acting himself.  EMS was called to the house and patient had refused transport to hospital.  At 11 AM CNA who cares for the patient noted he was leaning to the right and had possible right facial asymmetry.  EMS was again called to the house. On this arrival patient was brought to Hospital for possible code stroke. On arrival patient is very drowsy, diffusely weak in the LE and unable to give date.  He would follow simple commands.  He showed no focal or lateralizing symptoms. Patient also with N/V/D and dehydration per chart.    PT Comments    Patient able to ambulate in hall and perform stair negotiation today. Will continue to benefit from further PT services, HHPT upon discharge.  Follow Up Recommendations  Home health PT;Supervision/Assistance - 24 hour     Equipment Recommendations  None recommended by PT    Recommendations for Other Services       Precautions / Restrictions Precautions Precautions: Fall    Mobility  Bed Mobility Overal bed mobility: Needs Assistance Bed Mobility: Supine to Sit     Supine to sit: Min assist     General bed mobility comments: Min assist to come to EOB and rotate hips  Transfers Overall transfer level: Needs assistance Equipment used: Rolling walker (2 wheeled) Transfers: Sit to/from Stand Sit to Stand: Min assist         General transfer comment: Min assist to come to standing  Ambulation/Gait Ambulation/Gait assistance: Min guard Ambulation Distance (Feet): 210 Feet Assistive device: Rolling walker (2 wheeled) Gait Pattern/deviations: Shuffle;Decreased stride length;Trunk flexed;Narrow base of support;Festinating;Step-to pattern Gait velocity: decreased Gait velocity interpretation: <1.8  ft/sec, indicative of risk for recurrent falls General Gait Details: Cotninues to require max cues for increased stride, several small rest breaks taken during ambulation.  One extended seated rest break taken. (seated for approximately 4 minutes while discussing improvement strategies for gait    Stairs Stairs: Yes Stairs assistance: Min guard Stair Management: Two rails;Step to pattern Number of Stairs: 5 General stair comments: VCs for safety and sequencing during stair negotiation  Wheelchair Mobility    Modified Rankin (Stroke Patients Only)       Balance     Sitting balance-Leahy Scale: Good       Standing balance-Leahy Scale: Fair                      Cognition Arousal/Alertness: Awake/alert Behavior During Therapy: WFL for tasks assessed/performed Overall Cognitive Status: Within Functional Limits for tasks assessed Area of Impairment: Problem solving             Problem Solving: Slow processing;Requires verbal cues      Exercises      General Comments General comments (skin integrity, edema, etc.): spoke with patient at length regarding expectations for mobility as well as concerns for safety with ambulation and use of RW.  Educated patient on positioning, hand placement, posture and stride length/ gait speed.       Pertinent Vitals/Pain 4/10 back pain    Home Living                      Prior Function  PT Goals (current goals can now be found in the care plan section) Acute Rehab PT Goals Patient Stated Goal: to get back home and go back to restaraunts PT Goal Formulation: With patient Time For Goal Achievement: 03/07/14 Potential to Achieve Goals: Good Progress towards PT goals: Progressing toward goals    Frequency  Min 4X/week    PT Plan Current plan remains appropriate    Co-evaluation             End of Session Equipment Utilized During Treatment: Gait belt Activity Tolerance: Patient tolerated  treatment well Patient left: in bed;with call bell/phone within reach;with bed alarm set     Time: 3546-5681 PT Time Calculation (min): 28 min  Charges:  $Gait Training: 8-22 mins $Therapeutic Activity: 8-22 mins                    G Codes:      [name] 03/22/2014, 10:05 AM Alben Deeds, PT DPT  [phone]

## 2014-02-25 ENCOUNTER — Encounter (HOSPITAL_COMMUNITY): Payer: Self-pay | Admitting: Emergency Medicine

## 2014-02-25 ENCOUNTER — Inpatient Hospital Stay (HOSPITAL_COMMUNITY)
Admission: EM | Admit: 2014-02-25 | Discharge: 2014-03-05 | DRG: 871 | Disposition: A | Discharge status: Skilled Nursing Facility | Payer: Medicare Other | Attending: Internal Medicine | Admitting: Internal Medicine

## 2014-02-25 ENCOUNTER — Emergency Department (HOSPITAL_COMMUNITY): Payer: Medicare Other

## 2014-02-25 DIAGNOSIS — I2510 Atherosclerotic heart disease of native coronary artery without angina pectoris: Secondary | ICD-10-CM

## 2014-02-25 DIAGNOSIS — E441 Mild protein-calorie malnutrition: Secondary | ICD-10-CM | POA: Diagnosis present

## 2014-02-25 DIAGNOSIS — E7800 Pure hypercholesterolemia, unspecified: Secondary | ICD-10-CM | POA: Diagnosis present

## 2014-02-25 DIAGNOSIS — Z8673 Personal history of transient ischemic attack (TIA), and cerebral infarction without residual deficits: Secondary | ICD-10-CM

## 2014-02-25 DIAGNOSIS — M353 Polymyalgia rheumatica: Secondary | ICD-10-CM | POA: Diagnosis present

## 2014-02-25 DIAGNOSIS — E039 Hypothyroidism, unspecified: Secondary | ICD-10-CM | POA: Diagnosis present

## 2014-02-25 DIAGNOSIS — J690 Pneumonitis due to inhalation of food and vomit: Secondary | ICD-10-CM

## 2014-02-25 DIAGNOSIS — E46 Unspecified protein-calorie malnutrition: Secondary | ICD-10-CM | POA: Diagnosis not present

## 2014-02-25 DIAGNOSIS — M6281 Muscle weakness (generalized): Secondary | ICD-10-CM | POA: Diagnosis not present

## 2014-02-25 DIAGNOSIS — I509 Heart failure, unspecified: Secondary | ICD-10-CM | POA: Diagnosis present

## 2014-02-25 DIAGNOSIS — M549 Dorsalgia, unspecified: Secondary | ICD-10-CM | POA: Diagnosis present

## 2014-02-25 DIAGNOSIS — I252 Old myocardial infarction: Secondary | ICD-10-CM | POA: Diagnosis not present

## 2014-02-25 DIAGNOSIS — R5383 Other fatigue: Secondary | ICD-10-CM

## 2014-02-25 DIAGNOSIS — R279 Unspecified lack of coordination: Secondary | ICD-10-CM | POA: Diagnosis not present

## 2014-02-25 DIAGNOSIS — R5381 Other malaise: Secondary | ICD-10-CM | POA: Diagnosis not present

## 2014-02-25 DIAGNOSIS — R509 Fever, unspecified: Secondary | ICD-10-CM | POA: Diagnosis not present

## 2014-02-25 DIAGNOSIS — A419 Sepsis, unspecified organism: Secondary | ICD-10-CM | POA: Diagnosis not present

## 2014-02-25 DIAGNOSIS — I129 Hypertensive chronic kidney disease with stage 1 through stage 4 chronic kidney disease, or unspecified chronic kidney disease: Secondary | ICD-10-CM | POA: Diagnosis present

## 2014-02-25 DIAGNOSIS — E876 Hypokalemia: Secondary | ICD-10-CM | POA: Diagnosis present

## 2014-02-25 DIAGNOSIS — J189 Pneumonia, unspecified organism: Secondary | ICD-10-CM | POA: Diagnosis not present

## 2014-02-25 DIAGNOSIS — N183 Chronic kidney disease, stage 3 unspecified: Secondary | ICD-10-CM | POA: Diagnosis present

## 2014-02-25 DIAGNOSIS — Z7982 Long term (current) use of aspirin: Secondary | ICD-10-CM | POA: Diagnosis not present

## 2014-02-25 DIAGNOSIS — Z951 Presence of aortocoronary bypass graft: Secondary | ICD-10-CM | POA: Diagnosis not present

## 2014-02-25 DIAGNOSIS — Z87891 Personal history of nicotine dependence: Secondary | ICD-10-CM | POA: Diagnosis not present

## 2014-02-25 DIAGNOSIS — Z905 Acquired absence of kidney: Secondary | ICD-10-CM | POA: Diagnosis not present

## 2014-02-25 DIAGNOSIS — Z113 Encounter for screening for infections with a predominantly sexual mode of transmission: Secondary | ICD-10-CM

## 2014-02-25 DIAGNOSIS — R262 Difficulty in walking, not elsewhere classified: Secondary | ICD-10-CM | POA: Diagnosis not present

## 2014-02-25 DIAGNOSIS — R6889 Other general symptoms and signs: Secondary | ICD-10-CM | POA: Diagnosis not present

## 2014-02-25 DIAGNOSIS — J90 Pleural effusion, not elsewhere classified: Secondary | ICD-10-CM | POA: Diagnosis not present

## 2014-02-25 DIAGNOSIS — G8929 Other chronic pain: Secondary | ICD-10-CM | POA: Diagnosis present

## 2014-02-25 DIAGNOSIS — R531 Weakness: Secondary | ICD-10-CM

## 2014-02-25 DIAGNOSIS — Z79899 Other long term (current) drug therapy: Secondary | ICD-10-CM

## 2014-02-25 DIAGNOSIS — I10 Essential (primary) hypertension: Secondary | ICD-10-CM | POA: Diagnosis not present

## 2014-02-25 DIAGNOSIS — R112 Nausea with vomiting, unspecified: Secondary | ICD-10-CM

## 2014-02-25 DIAGNOSIS — N189 Chronic kidney disease, unspecified: Secondary | ICD-10-CM | POA: Diagnosis not present

## 2014-02-25 LAB — COMPREHENSIVE METABOLIC PANEL
ALBUMIN: 2.6 g/dL — AB (ref 3.5–5.2)
ALK PHOS: 62 U/L (ref 39–117)
ALT: 18 U/L (ref 0–53)
AST: 22 U/L (ref 0–37)
BILIRUBIN TOTAL: 0.5 mg/dL (ref 0.3–1.2)
BUN: 23 mg/dL (ref 6–23)
CHLORIDE: 106 meq/L (ref 96–112)
CO2: 20 meq/L (ref 19–32)
Calcium: 8 mg/dL — ABNORMAL LOW (ref 8.4–10.5)
Creatinine, Ser: 1.41 mg/dL — ABNORMAL HIGH (ref 0.50–1.35)
GFR calc Af Amer: 49 mL/min — ABNORMAL LOW (ref >90)
GFR, EST NON AFRICAN AMERICAN: 42 mL/min — AB (ref >90)
Glucose, Bld: 90 mg/dL (ref 70–99)
POTASSIUM: 2.9 meq/L — AB (ref 3.7–5.3)
Sodium: 141 mEq/L (ref 137–147)
Total Protein: 4.9 g/dL — ABNORMAL LOW (ref 6.0–8.3)

## 2014-02-25 LAB — CBC WITH DIFFERENTIAL/PLATELET
BASOS PCT: 0 % (ref 0–1)
Basophils Absolute: 0 10*3/uL (ref 0.0–0.1)
Eosinophils Absolute: 0.1 10*3/uL (ref 0.0–0.7)
Eosinophils Relative: 1 % (ref 0–5)
HEMATOCRIT: 36.1 % — AB (ref 39.0–52.0)
HEMOGLOBIN: 12.2 g/dL — AB (ref 13.0–17.0)
LYMPHS PCT: 9 % — AB (ref 12–46)
Lymphs Abs: 0.8 10*3/uL (ref 0.7–4.0)
MCH: 33.8 pg (ref 26.0–34.0)
MCHC: 33.8 g/dL (ref 30.0–36.0)
MCV: 100 fL (ref 78.0–100.0)
MONO ABS: 0.6 10*3/uL (ref 0.1–1.0)
Monocytes Relative: 6 % (ref 3–12)
NEUTROS ABS: 7.7 10*3/uL (ref 1.7–7.7)
NEUTROS PCT: 84 % — AB (ref 43–77)
Platelets: 123 10*3/uL — ABNORMAL LOW (ref 150–400)
RBC: 3.61 MIL/uL — ABNORMAL LOW (ref 4.22–5.81)
RDW: 12.8 % (ref 11.5–15.5)
WBC: 9.2 10*3/uL (ref 4.0–10.5)

## 2014-02-25 LAB — URINALYSIS, ROUTINE W REFLEX MICROSCOPIC
BILIRUBIN URINE: NEGATIVE
GLUCOSE, UA: NEGATIVE mg/dL
Hgb urine dipstick: NEGATIVE
KETONES UR: NEGATIVE mg/dL
Leukocytes, UA: NEGATIVE
Nitrite: NEGATIVE
PROTEIN: NEGATIVE mg/dL
Specific Gravity, Urine: 1.018 (ref 1.005–1.030)
Urobilinogen, UA: 1 mg/dL (ref 0.0–1.0)
pH: 5.5 (ref 5.0–8.0)

## 2014-02-25 LAB — BASIC METABOLIC PANEL
BUN: 23 mg/dL (ref 6–23)
CO2: 21 mEq/L (ref 19–32)
CREATININE: 1.43 mg/dL — AB (ref 0.50–1.35)
Calcium: 7.6 mg/dL — ABNORMAL LOW (ref 8.4–10.5)
Chloride: 108 mEq/L (ref 96–112)
GFR calc non Af Amer: 41 mL/min — ABNORMAL LOW (ref >90)
GFR, EST AFRICAN AMERICAN: 48 mL/min — AB (ref >90)
GLUCOSE: 124 mg/dL — AB (ref 70–99)
Potassium: 4.5 mEq/L (ref 3.7–5.3)
Sodium: 139 mEq/L (ref 137–147)

## 2014-02-25 LAB — INFLUENZA PANEL BY PCR (TYPE A & B)
H1N1 flu by pcr: NOT DETECTED
INFLAPCR: NEGATIVE
INFLBPCR: NEGATIVE

## 2014-02-25 LAB — I-STAT CG4 LACTIC ACID, ED: Lactic Acid, Venous: 1.42 mmol/L (ref 0.5–2.2)

## 2014-02-25 MED ORDER — ACETAMINOPHEN 500 MG PO TABS
1000.0000 mg | ORAL_TABLET | Freq: Three times a day (TID) | ORAL | Status: DC | PRN
  Administered 2014-02-25 – 2014-02-27 (×3): 1000 mg via ORAL
  Filled 2014-02-25 (×3): qty 2

## 2014-02-25 MED ORDER — VANCOMYCIN HCL IN DEXTROSE 1-5 GM/200ML-% IV SOLN
1000.0000 mg | Freq: Once | INTRAVENOUS | Status: AC
  Administered 2014-02-25: 1000 mg via INTRAVENOUS
  Filled 2014-02-25: qty 200

## 2014-02-25 MED ORDER — SODIUM CHLORIDE 0.9 % IV SOLN
INTRAVENOUS | Status: DC
  Administered 2014-02-25 – 2014-03-01 (×8): via INTRAVENOUS
  Administered 2014-03-01: 75 mL/h via INTRAVENOUS
  Administered 2014-03-02 – 2014-03-03 (×2): via INTRAVENOUS
  Administered 2014-03-04: 75 mL/h via INTRAVENOUS
  Administered 2014-03-05: 02:00:00 via INTRAVENOUS
  Administered 2014-03-05: 1000 mL via INTRAVENOUS

## 2014-02-25 MED ORDER — DEXTROSE 5 % IV SOLN
1.0000 g | Freq: Three times a day (TID) | INTRAVENOUS | Status: DC
  Administered 2014-02-25 – 2014-03-01 (×11): 1 g via INTRAVENOUS
  Filled 2014-02-25 (×14): qty 1

## 2014-02-25 MED ORDER — ONDANSETRON HCL 4 MG PO TABS: 4.0000 mg | ORAL_TABLET | Freq: Four times a day (QID) | ORAL | Status: DC | PRN

## 2014-02-25 MED ORDER — POTASSIUM CHLORIDE CRYS ER 20 MEQ PO TBCR
40.0000 meq | EXTENDED_RELEASE_TABLET | Freq: Once | ORAL | Status: AC
  Administered 2014-02-25: 40 meq via ORAL
  Filled 2014-02-25: qty 2

## 2014-02-25 MED ORDER — SODIUM CHLORIDE 0.9 % IV SOLN
1000.0000 mL | Freq: Once | INTRAVENOUS | Status: AC
  Administered 2014-02-25: 1000 mL via INTRAVENOUS

## 2014-02-25 MED ORDER — CLOPIDOGREL BISULFATE 75 MG PO TABS
75.0000 mg | ORAL_TABLET | Freq: Every day | ORAL | Status: DC
  Administered 2014-02-25 – 2014-03-05 (×9): 75 mg via ORAL
  Filled 2014-02-25 (×9): qty 1

## 2014-02-25 MED ORDER — VANCOMYCIN HCL 10 G IV SOLR
1250.0000 mg | INTRAVENOUS | Status: DC
  Filled 2014-02-25: qty 1250

## 2014-02-25 MED ORDER — THERA M PLUS PO TABS: 1.0000 | ORAL_TABLET | Freq: Every day | ORAL | Status: DC

## 2014-02-25 MED ORDER — SODIUM CHLORIDE 0.9 % IV SOLN
1000.0000 mL | INTRAVENOUS | Status: DC
Start: 2014-02-25 — End: 2014-02-25

## 2014-02-25 MED ORDER — LEVOFLOXACIN IN D5W 750 MG/150ML IV SOLN
750.0000 mg | INTRAVENOUS | Status: DC
  Administered 2014-02-27 – 2014-03-01 (×2): 750 mg via INTRAVENOUS
  Filled 2014-02-25 (×2): qty 150

## 2014-02-25 MED ORDER — ENOXAPARIN SODIUM 40 MG/0.4ML ~~LOC~~ SOLN
40.0000 mg | SUBCUTANEOUS | Status: DC
  Administered 2014-02-25 – 2014-03-04 (×8): 40 mg via SUBCUTANEOUS
  Filled 2014-02-25 (×9): qty 0.4

## 2014-02-25 MED ORDER — ATORVASTATIN CALCIUM 20 MG PO TABS
20.0000 mg | ORAL_TABLET | Freq: Every day | ORAL | Status: DC
  Administered 2014-02-25 – 2014-03-04 (×8): 20 mg via ORAL
  Filled 2014-02-25 (×9): qty 1

## 2014-02-25 MED ORDER — POLYETHYLENE GLYCOL 3350 17 G PO PACK
17.0000 g | PACK | Freq: Every day | ORAL | Status: DC
  Administered 2014-02-25 – 2014-03-05 (×6): 17 g via ORAL
  Filled 2014-02-25 (×9): qty 1

## 2014-02-25 MED ORDER — SODIUM CHLORIDE 0.9 % IV SOLN: 1000.0000 mL | Freq: Once | INTRAVENOUS | Status: DC

## 2014-02-25 MED ORDER — AZTREONAM 2 G IJ SOLR
2.0000 g | Freq: Once | INTRAMUSCULAR | Status: AC
  Administered 2014-02-25: 2 g via INTRAVENOUS
  Filled 2014-02-25: qty 2

## 2014-02-25 MED ORDER — PREDNISONE 5 MG PO TABS
5.0000 mg | ORAL_TABLET | Freq: Every day | ORAL | Status: DC
  Administered 2014-02-25 – 2014-03-05 (×9): 5 mg via ORAL
  Filled 2014-02-25 (×9): qty 1

## 2014-02-25 MED ORDER — GABAPENTIN 100 MG PO CAPS
200.0000 mg | ORAL_CAPSULE | Freq: Every day | ORAL | Status: DC
  Administered 2014-02-25 – 2014-03-04 (×8): 200 mg via ORAL
  Filled 2014-02-25 (×9): qty 2

## 2014-02-25 MED ORDER — NITROGLYCERIN 0.4 MG SL SUBL: 0.4000 mg | SUBLINGUAL_TABLET | SUBLINGUAL | Status: DC | PRN

## 2014-02-25 MED ORDER — LEVOFLOXACIN IN D5W 750 MG/150ML IV SOLN
750.0000 mg | Freq: Once | INTRAVENOUS | Status: AC
  Administered 2014-02-25: 750 mg via INTRAVENOUS
  Filled 2014-02-25: qty 150

## 2014-02-25 MED ORDER — ADULT MULTIVITAMIN W/MINERALS CH
1.0000 | ORAL_TABLET | Freq: Every day | ORAL | Status: DC
  Administered 2014-02-25 – 2014-03-05 (×9): 1 via ORAL
  Filled 2014-02-25 (×9): qty 1

## 2014-02-25 MED ORDER — ASPIRIN 81 MG PO CHEW
81.0000 mg | CHEWABLE_TABLET | Freq: Every day | ORAL | Status: DC
  Administered 2014-02-25 – 2014-03-05 (×9): 81 mg via ORAL
  Filled 2014-02-25 (×10): qty 1

## 2014-02-25 MED ORDER — ONDANSETRON HCL 4 MG/2ML IJ SOLN: 4.0000 mg | Freq: Four times a day (QID) | INTRAMUSCULAR | Status: DC | PRN

## 2014-02-25 MED ORDER — TRAMADOL HCL 50 MG PO TABS
50.0000 mg | ORAL_TABLET | Freq: Two times a day (BID) | ORAL | Status: DC | PRN
  Administered 2014-03-05: 50 mg via ORAL
  Filled 2014-02-25: qty 1

## 2014-02-25 MED ORDER — ACETAMINOPHEN 325 MG PO TABS
650.0000 mg | ORAL_TABLET | Freq: Once | ORAL | Status: AC
  Administered 2014-02-25: 650 mg via ORAL
  Filled 2014-02-25: qty 2

## 2014-02-25 MED ORDER — LEVOTHYROXINE SODIUM 75 MCG PO TABS
75.0000 ug | ORAL_TABLET | Freq: Every day | ORAL | Status: DC
  Administered 2014-02-26 – 2014-03-05 (×8): 75 ug via ORAL
  Filled 2014-02-25 (×9): qty 1

## 2014-02-25 MED ORDER — DOXAZOSIN MESYLATE 4 MG PO TABS
4.0000 mg | ORAL_TABLET | Freq: Every day | ORAL | Status: DC
  Administered 2014-02-25 – 2014-03-04 (×8): 4 mg via ORAL
  Filled 2014-02-25 (×9): qty 1

## 2014-02-25 NOTE — ED Notes (Signed)
Wife and caregiver at bedside

## 2014-02-25 NOTE — Progress Notes (Signed)
Patient admitted to unit at 1310. Oriented to room, call bell, and staff. Bed in lowest position. Fall safety plan reviewed. Full assessment to Epic. Will continue to monitor. Toniann Ket, RN

## 2014-02-25 NOTE — Progress Notes (Signed)
ANTIBIOTIC CONSULT NOTE - INITIAL  Pharmacy Consult for Vancomycin, Levaquin, Aztreonam Indication: HCAP  Allergies  Allergen Reactions  . Penicillins Rash    Patient Measurements: Height: 5' 10.5" (179.1 cm) Weight: 190 lb (86.183 kg) IBW/kg (Calculated) : 74.15  Vital Signs: Temp: 101.5 F (38.6 C) (04/18 0931) Temp src: Rectal (04/18 0931) BP: 108/42 mmHg (04/18 0915) Pulse Rate: 88 (04/18 0917) Intake/Output from previous day:   Intake/Output from this shift:    Labs: No results found for this basename: WBC, HGB, PLT, LABCREA, CREATININE,  in the last 72 hours Estimated Creatinine Clearance: 37.7 ml/min (by C-G formula based on Cr of 1.34). No results found for this basename: Letta Median, VANCORANDOM, [place], GENTPEAK, [place], TOBRATROUGH, TOBRAPEAK, TOBRARND, AMIKACINPEAK, AMIKACINTROU, AMIKACIN,  in the last 72 hours   Microbiology: Recent Results (from the past 720 hour(s))  URINE CULTURE     Status: None   Collection Time    02/20/14  2:55 PM      Result Value Ref Range Status   Specimen Description URINE, RANDOM   Final   Special Requests ADDED AT [facility] [id number]   Final   Culture  Setup Time     Final   Value: 02/20/2014 19:16     Performed at [facility]     Final   Value: NO GROWTH     Performed at [facility]   Culture     Final   Value: NO GROWTH     Performed at [facility]   Report Status 02/21/2014 FINAL   Final    Medical History: Past Medical History  Diagnosis Date  . Chronic back pain   . Hypercholesteremia   . Hypothyroidism   . CAD (coronary artery disease)   . Chronic kidney disease (CKD), stage III (moderate)   . Polymyalgia rheumatica   . Left bundle branch block   . Hx of CABG   . History of nephrectomy   . Internal carotid artery stenosis   . Myocardial infarction   . CHF (congestive heart failure)   . Stroke     Medications:  See electronic med  rec  Assessment: 80 y.o. male presents with fever, weakness, and productive cough. Pt with recent hospitalization for N/V (d/c on 4/15). To begin broad spectrum antibiotics (Vancomycin, Levaquin, and Aztreonam) for HCAP. SCr 1.34, est CrCl 35 ml/min. Tm 101.5.   Goal of Therapy:  Vancomycin trough level 15-20 mcg/ml  Plan:  1. Aztreonam 2gm IV now then 1gm IV q8h. 2. Levaquin 750mg  IV now then 750mg  IV q48h 3. Vancomycin 1gm IV now then 1250mg  IV q24h. 4. Will f/u micro data, renal function, pt's clinical condition, vanc trough prn  Sherlon Handing, PharmD, BCPS Clinical pharmacist, pager [phone] 02/25/2014,10:05 AM

## 2014-02-25 NOTE — ED Notes (Signed)
Pt arrived by [facility] from home. Pt c/o weakness since yesterday but was feeling ok and this morning it became worse. Pt has a home health nurse that comes out and checked his temperature and it was 102. They administered Tylenol 650mg . Pt has been coughing up yellow sputum and x 2 days. Normally walks with walker but has a hard time standing now.

## 2014-02-25 NOTE — ED Provider Notes (Signed)
CSN: [id number]     Arrival date & time 02/25/14  [id number] History   First MD Initiated Contact with Patient 02/25/14 0914     Chief Complaint  Patient presents with  . Weakness  . Fever     (Consider location/radiation/quality/duration/timing/severity/associated sxs/prior Treatment) Patient is a 80 y.o. male presenting with weakness and fever.  Weakness  Fever Associated symptoms: cough    Complaint of fever 102 onset last night accompanied by general malaise and cough. No vomiting no shortness of breath. No other associated symptoms. Brought by EMS. Treated with oxygen prior to coming and intravenous fluids prior to coming here. Nothing makes symptoms better or worse. No nausea or vomiting. No focal numbness or weakness. No other associated symptoms. Patient seen here earlier this week for nausea vomiting generalized weakness. Had brief inpatient hospitalization stay. Past Medical History  Diagnosis Date  . Chronic back pain   . Hypercholesteremia   . Hypothyroidism   . CAD (coronary artery disease)   . Chronic kidney disease (CKD), stage III (moderate)   . Polymyalgia rheumatica   . Left bundle branch block   . Hx of CABG   . History of nephrectomy   . Internal carotid artery stenosis   . Myocardial infarction   . CHF (congestive heart failure)   . Stroke    Past Surgical History  Procedure Laterality Date  . Coronary artery bypass graft    . Cholecystectomy    . Kidney surgery      kidney removed   Family History  Problem Relation Age of Onset  . Other Mother     [place] Flu  . Heart disease Father    History  Substance Use Topics  . Smoking status: Former Smoker    Types: Cigarettes    Quit date: 11/10/1970  . Smokeless tobacco: Never Used  . Alcohol Use: No    Review of Systems  Constitutional: Positive for fever.       Gen. malaise  HENT: Negative.   Respiratory: Positive for cough.   Cardiovascular: Negative.   Gastrointestinal: Negative.    Musculoskeletal: Negative.   Skin: Negative.   Neurological: Positive for weakness.       Generalized weakness  Psychiatric/Behavioral: Negative.   All other systems reviewed and are negative.     Allergies  Penicillins  Home Medications   Prior to Admission medications   Medication Sig Start Date End Date Taking? Authorizing Provider  acetaminophen (TYLENOL) 500 MG tablet Take 1,000 mg by mouth every 8 (eight) hours as needed for pain.   Yes Historical Provider, MD  amLODipine (NORVASC) 2.5 MG tablet Take 2.5 mg by mouth daily.     Yes Historical Provider, MD  aspirin 81 MG tablet Take 81 mg by mouth daily.   Yes Historical Provider, MD  atorvastatin (LIPITOR) 20 MG tablet Take 20 mg by mouth daily.   Yes Historical Provider, MD  clopidogrel (PLAVIX) 75 MG tablet Take 75 mg by mouth daily.     Yes Historical Provider, MD  doxazosin (CARDURA) 4 MG tablet Take 4 mg by mouth at bedtime.   Yes Historical Provider, MD  gabapentin (NEURONTIN) 100 MG capsule Take 200 mg by mouth at bedtime.   Yes Historical Provider, MD  levothyroxine (SYNTHROID, LEVOTHROID) 75 MCG tablet Take 75 mcg by mouth daily.   Yes Historical Provider, MD  Multiple Vitamins-Minerals (MULTIVITAMINS THER. W/MINERALS) TABS Take 1 tablet by mouth daily.     Yes Historical Provider, MD  nitroGLYCERIN (  NITROSTAT) 0.4 MG SL tablet Place 0.4 mg under the tongue every 5 (five) minutes as needed for chest pain.   Yes Historical Provider, MD  polyethylene glycol (MIRALAX / GLYCOLAX) packet Take 17 g by mouth daily.   Yes Historical Provider, MD  predniSONE (DELTASONE) 5 MG tablet Take 5 mg by mouth daily.   Yes Historical Provider, MD  traMADol (ULTRAM) 50 MG tablet Take 50 mg by mouth 2 (two) times daily.   Yes Historical Provider, MD   BP 108/42  Pulse 88  Temp(Src) 101.5 F (38.6 C) (Rectal)  Resp 21  Ht 5' 10.5" (1.791 m)  Wt 190 lb (86.183 kg)  BMI 26.87 kg/m2  SpO2 95% Physical Exam  Nursing note and vitals  reviewed. Constitutional: He appears well-developed and well-nourished.  HENT:  Head: Normocephalic and atraumatic.  Mucous membranes dry  Eyes: Conjunctivae are normal. Pupils are equal, round, and reactive to light.  Neck: Neck supple. No tracheal deviation present. No thyromegaly present.  Cardiovascular: Normal rate and regular rhythm.   No murmur heard. Pulmonary/Chest: Effort normal and breath sounds normal.  Abdominal: Soft. Bowel sounds are normal. He exhibits no distension. There is no tenderness.  Genitourinary:  Normal male genitalia  Musculoskeletal: Normal range of motion. He exhibits no edema and no tenderness.  Neurological: He is alert. Coordination normal.  Skin: Skin is warm and dry. No rash noted.  Psychiatric: He has a normal mood and affect.    ED Course  Procedures (including critical care time) Labs Review Labs Reviewed  CULTURE, BLOOD (ROUTINE X 2)  CULTURE, BLOOD (ROUTINE X 2)  URINE CULTURE  CBC WITH DIFFERENTIAL  COMPREHENSIVE METABOLIC PANEL  URINALYSIS, ROUTINE W REFLEX MICROSCOPIC  I-STAT CG4 LACTIC ACID, ED    Imaging Review No results found.   EKG Interpretation None     Chest xray viewed by me Results for orders placed during the hospital encounter of 02/25/14  CBC WITH DIFFERENTIAL      Result Value Ref Range   WBC 9.2  4.0 - 10.5 K/uL   RBC 3.61 (*) 4.22 - 5.81 MIL/uL   Hemoglobin 12.2 (*) 13.0 - 17.0 g/dL   HCT 36.1 (*) 39.0 - 52.0 %   MCV 100.0  78.0 - 100.0 fL   MCH 33.8  26.0 - 34.0 pg   MCHC 33.8  30.0 - 36.0 g/dL   RDW 12.8  11.5 - 15.5 %   Platelets 123 (*) 150 - 400 K/uL   Neutrophils Relative % 84 (*) 43 - 77 %   Neutro Abs 7.7  1.7 - 7.7 K/uL   Lymphocytes Relative 9 (*) 12 - 46 %   Lymphs Abs 0.8  0.7 - 4.0 K/uL   Monocytes Relative 6  3 - 12 %   Monocytes Absolute 0.6  0.1 - 1.0 K/uL   Eosinophils Relative 1  0 - 5 %   Eosinophils Absolute 0.1  0.0 - 0.7 K/uL   Basophils Relative 0  0 - 1 %   Basophils  Absolute 0.0  0.0 - 0.1 K/uL  COMPREHENSIVE METABOLIC PANEL      Result Value Ref Range   Sodium 141  137 - 147 mEq/L   Potassium 2.9 (*) 3.7 - 5.3 mEq/L   Chloride 106  96 - 112 mEq/L   CO2 20  19 - 32 mEq/L   Glucose, Bld 90  70 - 99 mg/dL   BUN 23  6 - 23 mg/dL   Creatinine, Ser 1.41 (*)  0.50 - 1.35 mg/dL   Calcium 8.0 (*) 8.4 - 10.5 mg/dL   Total Protein 4.9 (*) 6.0 - 8.3 g/dL   Albumin 2.6 (*) 3.5 - 5.2 g/dL   AST 22  0 - 37 U/L   ALT 18  0 - 53 U/L   Alkaline Phosphatase 62  39 - 117 U/L   Total Bilirubin 0.5  0.3 - 1.2 mg/dL   GFR calc non Af Amer 42 (*) >90 mL/min   GFR calc Af Amer 49 (*) >90 mL/min  I-STAT CG4 LACTIC ACID, ED      Result Value Ref Range   Lactic Acid, Venous 1.42  0.5 - 2.2 mmol/L   Ct Head Wo Contrast  02/20/2014   CLINICAL DATA:  Acute onset right facial weakness.  Code stroke.  EXAM: CT HEAD WITHOUT CONTRAST  TECHNIQUE: Contiguous axial images were obtained from the base of the skull through the vertex without intravenous contrast.  COMPARISON:  12/29/12  FINDINGS: There is no evidence of intracranial hemorrhage, brain edema, or other signs of acute infarction. There is no evidence of intracranial mass lesion or mass effect. No abnormal extraaxial fluid collections are identified.  Moderate cerebral atrophy and chronic small vessel disease is stable in appearance. Old left basal ganglia 10 again noted. Ventricles are stable in size. No evidence of skull fracture or other bone lesion.  IMPRESSION: No acute intracranial abnormality.  Stable cerebral atrophy and chronic small vessel disease.  These results were called by telephone at the time of interpretation on 02/20/2014 at 1:31 PM to Dr. Alexis Goodell, who verbally acknowledged these results.   Electronically Signed   By: Earle Gell M.D.   On: 02/20/2014 13:37   Mr Brain Wo Contrast  02/21/2014   CLINICAL DATA:  Leaning towards the right.  Facial asymmetry.  EXAM: MRI HEAD WITHOUT CONTRAST  TECHNIQUE:  Multiplanar, multiecho pulse sequences of the brain and surrounding structures were obtained without intravenous contrast.  COMPARISON:  Head CT 02/20/2014.  MRI 09/08/2011.  FINDINGS: Diffusion imaging does not show any acute or subacute infarction. There are mild chronic small-vessel changes of the pons. No cerebellar insult. The cerebral hemispheres show moderate chronic small-vessel changes of the deep and subcortical white matter. No cortical or large vessel territory infarction. No mass lesion, hemorrhage, hydrocephalus or extra-axial collection. No pituitary mass. No inflammatory sinus disease of significance. Mild mucosal thickening of the maxillary and sphenoid sinuses. No skull or skullbase lesion.  IMPRESSION: No acute or reversible finding. Generalized brain atrophy. Chronic small-vessel changes throughout, somewhat progressive since 2012.   Electronically Signed   By: Nelson Chimes M.D.   On: 02/21/2014 20:27   Dg Chest Port 1 View  02/25/2014   CLINICAL DATA:  Weakness with fever.  EXAM: PORTABLE CHEST - 1 VIEW  COMPARISON:  02/20/2014.  FINDINGS: Cardiac enlargement. Prior CABG focal opacity right mid and lower lung zones consistent with pneumonia. No effusion or pneumothorax. Osteopenia. Worsening aeration.  IMPRESSION: New asymmetric right mid and lower lung zone infiltrate consistent with pneumonia.   Electronically Signed   By: Rolla Flatten M.D.   On: 02/25/2014 10:28   Dg Abd Acute W/chest  02/20/2014   CLINICAL DATA:  Nausea and vomiting.  Weakness.  EXAM: ACUTE ABDOMEN SERIES (ABDOMEN 2 VIEW & CHEST 1 VIEW)  COMPARISON:  01/03/2013  FINDINGS: Bowel gas pattern is normal without evidence of ileus, obstruction or free air. Clips in the right upper quadrant indicate previous cholecystectomy. There is curvature  in degenerative change of the spine.  One-view chest shows previous median sternotomy and CABG. Heart size is at the upper limits of normal. Calcification of the aorta up. The lungs are  clear. The vascularity is normal. No effusions.  IMPRESSION: Negative acute abdominal series. No finding to explain the presenting symptoms.   Electronically Signed   By: Nelson Chimes M.D.   On: 02/20/2014 19:42    Patient alert and talkative after treatment with intravenous fluids and intravenous antibiotics. MDM  Code sepsis called based on respiratory rate and fever  Final diagnoses:  None   spoke with Dr.Regaldo plan admit medical surgical floor Diagnosis #1 sepsis #2healthcare associated pneumonia #3 hypokalemia #4 renal insuffiency      Orlie Dakin, MD 02/25/14 1208

## 2014-02-25 NOTE — H&P (Signed)
[name] History and Physical  Barry Wright XKG:[id number] DOB: 1920/04/19 DOA: 02/25/2014  Referring physician: Winfred Leeds.  PCP: Horton Finer, MD   Chief Complaint: cough.   HPI: Barry Wright is a 80 y.o. male with PMH significant for CKD stage III, CHF, nephrectomy, recently discharge from hospital after treatment of dehydration who presents with cough, fever, generalized weakness. That started day prior to admission. He was very weak this morning unable to stand up. He had Tempeture at 101.  His BP was initially in the 80, respond to IV fluids.    Review of Systems:  Negative, except as per HPI.    Past Medical History  Diagnosis Date  . Chronic back pain   . Hypercholesteremia   . Hypothyroidism   . CAD (coronary artery disease)   . Chronic kidney disease (CKD), stage III (moderate)   . Polymyalgia rheumatica   . Left bundle branch block   . Hx of CABG   . History of nephrectomy   . Internal carotid artery stenosis   . Myocardial infarction   . CHF (congestive heart failure)   . Stroke    Past Surgical History  Procedure Laterality Date  . Coronary artery bypass graft    . Cholecystectomy    . Kidney surgery      kidney removed   Social History:  reports that he quit smoking about 43 years ago. His smoking use included Cigarettes. He smoked 0.00 packs per day. He has never used smokeless tobacco. He reports that he does not drink alcohol or use illicit drugs.  Allergies  Allergen Reactions  . Penicillins Rash    Family History  Problem Relation Age of Onset  . Other Mother     [place] Flu  . Heart disease Father      Prior to Admission medications   Medication Sig Start Date End Date Taking? Authorizing Provider  acetaminophen (TYLENOL) 500 MG tablet Take 1,000 mg by mouth every 8 (eight) hours as needed for pain.   Yes Historical Provider, MD  amLODipine (NORVASC) 2.5 MG tablet Take 2.5 mg by mouth daily.     Yes Historical  Provider, MD  aspirin 81 MG tablet Take 81 mg by mouth daily.   Yes Historical Provider, MD  atorvastatin (LIPITOR) 20 MG tablet Take 20 mg by mouth daily.   Yes Historical Provider, MD  clopidogrel (PLAVIX) 75 MG tablet Take 75 mg by mouth daily.     Yes Historical Provider, MD  doxazosin (CARDURA) 4 MG tablet Take 4 mg by mouth at bedtime.   Yes Historical Provider, MD  gabapentin (NEURONTIN) 100 MG capsule Take 200 mg by mouth at bedtime.   Yes Historical Provider, MD  levothyroxine (SYNTHROID, LEVOTHROID) 75 MCG tablet Take 75 mcg by mouth daily.   Yes Historical Provider, MD  Multiple Vitamins-Minerals (MULTIVITAMINS THER. W/MINERALS) TABS Take 1 tablet by mouth daily.     Yes Historical Provider, MD  nitroGLYCERIN (NITROSTAT) 0.4 MG SL tablet Place 0.4 mg under the tongue every 5 (five) minutes as needed for chest pain.   Yes Historical Provider, MD  polyethylene glycol (MIRALAX / GLYCOLAX) packet Take 17 g by mouth daily.   Yes Historical Provider, MD  predniSONE (DELTASONE) 5 MG tablet Take 5 mg by mouth daily.   Yes Historical Provider, MD  traMADol (ULTRAM) 50 MG tablet Take 50 mg by mouth 2 (two) times daily.   Yes Historical Provider, MD   Physical Exam: Filed Vitals:  02/25/14 1245  BP: 111/36  Pulse: 83  Temp:   Resp: 19    BP 111/36  Pulse 83  Temp(Src) 101.5 F (38.6 C) (Rectal)  Resp 19  Ht 5' 10.5" (1.791 m)  Wt 86.183 kg (190 lb)  BMI 26.87 kg/m2  SpO2 97%  General:  Appears calm and comfortable Eyes: PERRL, normal lids, irises & conjunctiva ENT: grossly normal hearing, lips & tongue Neck: no LAD, masses or thyromegaly Cardiovascular: RRR, no m/r/g. No LE edema. Respiratory: bilateral ronchus,  Normal respiratory effort. Abdomen: soft, ntnd Skin: no rash or induration seen on limited exam Musculoskeletal: grossly normal tone BUE/BLE Neurologic: grossly non-focal.          Labs on Admission:  Basic Metabolic Panel:  Recent Labs Lab 02/20/14 1316  02/20/14 1330 02/20/14 1708 02/21/14 0606 02/22/14 0340 02/25/14 0951  NA 140 139  --  140 141 141  K 4.7 4.4  --  3.7 3.8 2.9*  CL 100 100  --  103 105 106  CO2 27  --   --  24 23 20   GLUCOSE 113* 112*  --  95 113* 90  BUN 29* 39*  --  26* 24* 23  CREATININE 1.41* 1.40* 1.47* 1.44* 1.34 1.41*  CALCIUM 8.8  --   --  8.0* 7.8* 8.0*  MG  --   --  1.5  --   --   --    Liver Function Tests:  Recent Labs Lab 02/20/14 1316 02/22/14 0340 02/25/14 0951  AST 34 25 22  ALT 18 16 18   ALKPHOS 55 63 62  BILITOT 0.5 0.3 0.5  PROT 5.7* 4.8* 4.9*  ALBUMIN 3.1* 2.4* 2.6*   No results found for this basename: LIPASE, AMYLASE,  in the last 168 hours No results found for this basename: AMMONIA,  in the last 168 hours CBC:  Recent Labs Lab 02/20/14 1316 02/20/14 1330 02/20/14 1708 02/21/14 0606 02/22/14 0340 02/25/14 0951  WBC 8.6  --  8.1 6.3 7.7 9.2  NEUTROABS 7.2  --   --   --   --  7.7  HGB 13.6 14.3 12.6* 12.2* 11.9* 12.2*  HCT 39.8 42.0 37.5* 35.6* 35.1* 36.1*  MCV 98.5  --  100.0 99.4 100.3* 100.0  PLT 145*  --  122* 107* 104* 123*   Cardiac Enzymes: No results found for this basename: CKTOTAL, CKMB, CKMBINDEX, TROPONINI,  in the last 168 hours  BNP (last 3 results) No results found for this basename: PROBNP,  in the last 8760 hours CBG: No results found for this basename: GLUCAP,  in the last 168 hours  Radiological Exams on Admission: Dg Chest Port 1 View  02/25/2014   CLINICAL DATA:  Weakness with fever.  EXAM: PORTABLE CHEST - 1 VIEW  COMPARISON:  02/20/2014.  FINDINGS: Cardiac enlargement. Prior CABG focal opacity right mid and lower lung zones consistent with pneumonia. No effusion or pneumothorax. Osteopenia. Worsening aeration.  IMPRESSION: New asymmetric right mid and lower lung zone infiltrate consistent with pneumonia.   Electronically Signed   By: Rolla Flatten M.D.   On: 02/25/2014 10:28   Assessment/Plan Principal Problem:   PNA (pneumonia) Active  Problems:   Chronic kidney disease (CKD), stage III (moderate)   Sepsis  1-Health Care Associated PNA; patient presents with cough, fever, chest x ray with infiltrates. Recently discharge from hospital.  Continue with Vancomycin, Aztreonam and Levaquin.  Blood culture ordered.  Sputum culture ordered.  Lactic acid normal.   2-Hypotension;  early sepsis. Respond to IV fluids. Hold BP medications. Lactic acid normal.   3-Hypokalemia; replete with 40 meq times one.   4-Weakness; generalized; likely secondary to acute illness. Neuro exam non focal.  5-CKD stage III; at baseline.    Code Status: Full Code.  Family Communication: care discussed with wife who was at bedside.  Disposition Plan: expect 3 to 4 days inpatient.   Time spent: 75 minutes.   [facility] Pager [phone]

## 2014-02-26 LAB — EXPECTORATED SPUTUM ASSESSMENT W REFEX TO RESP CULTURE: Special Requests: NORMAL

## 2014-02-26 LAB — EXPECTORATED SPUTUM ASSESSMENT W GRAM STAIN, RFLX TO RESP C

## 2014-02-26 NOTE — Progress Notes (Signed)
Subjective: Feel some better BP better  Objective: Vital signs in last 24 hours: Temp:  [97.5 F (36.4 C)-101.5 F (38.6 C)] 98 F (36.7 C) (04/19 0544) Pulse Rate:  [63-88] 73 (04/19 0544) Resp:  [18-26] 18 (04/19 0544) BP: (83-130)/(14-62) 130/60 mmHg (04/19 0544) SpO2:  [88 %-99 %] 92 % (04/19 0544) Weight:  [86.182 kg (190 lb)-86.4 kg (190 lb 7.6 oz)] 86.4 kg (190 lb 7.6 oz) (04/19 0544) Weight change:  Last BM Date: 02/23/14  Intake/Output from previous day: 04/18 0701 - 04/19 0700 In: 1951.7 [P.O.:355; I.V.:1596.7] Out: 375 [Urine:375] Intake/Output this shift:    General appearance: alert Resp: rhonchi RLL Cardio: regular rate and rhythm GI: soft, non-tender; bowel sounds normal; no masses,  no organomegaly Extremities: extremities normal, atraumatic, no cyanosis or edema  Lab Results:  Recent Labs  02/25/14 0951  WBC 9.2  HGB 12.2*  HCT 36.1*  PLT 123*   BMET  Recent Labs  02/25/14 0951 02/25/14 1850  NA 141 139  K 2.9* 4.5  CL 106 108  CO2 20 21  GLUCOSE 90 124*  BUN 23 23  CREATININE 1.41* 1.43*  CALCIUM 8.0* 7.6*    Studies/Results: Dg Chest Port 1 View  02/25/2014   CLINICAL DATA:  Weakness with fever.  EXAM: PORTABLE CHEST - 1 VIEW  COMPARISON:  02/20/2014.  FINDINGS: Cardiac enlargement. Prior CABG focal opacity right mid and lower lung zones consistent with pneumonia. No effusion or pneumothorax. Osteopenia. Worsening aeration.  IMPRESSION: New asymmetric right mid and lower lung zone infiltrate consistent with pneumonia.   Electronically Signed   By: Rolla Flatten M.D.   On: 02/25/2014 10:28    Medications: I have reviewed the patient's current medications.  Assessment/Plan: Right Lung infiltrate- HAP- Pneumonia: continue Levaquin and aztreonam IV; stop Vancomycin- WBC - normal- afebrile. Blood culture pending. CKD stage III- stable creatinine HTN: low BP on admission- - early sepsis- BP better- Hold off BP meds; IVF Hypokalemia: K is  better. Hypothyriod- continue synthroid PMR- continue low dose prednisone Weakness: PT eval.    LOS: 1 day   Wenda Low 02/26/2014, 8:33 AM

## 2014-02-26 NOTE — Progress Notes (Signed)
Utilization review completed.  

## 2014-02-26 NOTE — Evaluation (Addendum)
Physical Therapy Evaluation Patient Details Name: Barry Wright MRN: [id number] DOB: [birth year] Today's Date: 02/26/2014   History of Present Illness  80 y.o. male with PMH significant for CKD stage III, CHF, nephrectomy, recently discharge from hospital after treatment of dehydration who presents with cough, fever, generalized weakness. Pt with PNA.  Clinical Impression  Pt admitted with above. Pt currently with functional limitations due to the deficits listed below (see PT Problem List).  Pt will benefit from skilled PT to increase their independence and safety with mobility to allow discharge home with 24 hour hired caregivers and wife. Pt significantly weaker at this point than he was at dc a few days ago. Spoke to wife and pt about possible ST-SNF if pt doesn't regain enough strength to return home.     Follow Up Recommendations Home health PT;Supervision/Assistance - 24 hour(if pt is requiring more assist than aides can provide may need ST-SNF)    Equipment Recommendations  None recommended by PT    Recommendations for Other Services       Precautions / Restrictions Precautions Precautions: Fall      Mobility  Bed Mobility                  Transfers Overall transfer level: Needs assistance Equipment used: Rolling walker (2 wheeled) Transfers: Sit to/from Stand Sit to Stand: Min assist Stand pivot transfers: Mod assist       General transfer comment: Required assist to stand more erect with stand pivot. Verbal cues to stand more erect. Pt in flexed posture with pivot.  Ambulation/Gait             General Gait Details: Unable to attempt due to fatigue after transfer from chair to bsc to chair.  Stairs            Wheelchair Mobility    Modified Rankin (Stroke Patients Only)       Balance   Sitting-balance support: No upper extremity supported;Feet supported Sitting balance-Leahy Scale: Fair     Standing balance support: Bilateral upper  extremity supported Standing balance-Leahy Scale: Poor Standing balance comment: upper extremity support on walker as well as min A and verbal cues to stand more erect                             Pertinent Vitals/Pain Dyspnea 2/4 with 2L O2.    Home Living Family/patient expects to be discharged to:: Private residence Living Arrangements: Spouse/significant other (wife unable to physically assist pt) Available Help at Discharge: Personal care attendant (typically during the day but can have 24 hours) Type of Home: House Home Access: Stairs to enter Entrance Stairs-Rails: Can reach both Entrance Stairs-Number of Steps: 3 Home Layout: Two level Home Equipment: Rankin - 2 wheels;Bedside commode;Shower seat;Grab bars - toilet;Grab bars - tub/shower;Wheelchair - manual Additional Comments: aide helps with househould tasks, lays out clothes, etc.; able to assist with basic ADLs if needed    Prior Function Level of Independence: Needs assistance   Gait / Transfers Assistance Needed: supervision with use of RW  ADL's / Homemaking Assistance Needed: aide helps with household tasks, setup for ADLs  Comments: reports that he is usually able to clib stairs with caution, has a RW upstairs and downstairs     Hand Dominance   Dominant Hand: Right    Extremity/Trunk Assessment   Upper Extremity Assessment: Generalized weakness  Lower Extremity Assessment: Generalized weakness         Communication   Communication: HOH  Cognition Arousal/Alertness: Awake/alert Behavior During Therapy: WFL for tasks assessed/performed Overall Cognitive Status: Within Functional Limits for tasks assessed                      General Comments      Exercises        Assessment/Plan    PT Assessment Patient needs continued PT services  PT Diagnosis Difficulty walking;Generalized weakness   PT Problem List Decreased strength;Decreased activity  tolerance;Decreased balance;Decreased mobility;Decreased knowledge of use of DME  PT Treatment Interventions DME instruction;Gait training;Stair training;Functional mobility training;Therapeutic activities;Therapeutic exercise;Balance training;Patient/family education   PT Goals (Current goals can be found in the Care Plan section) Acute Rehab PT Goals Patient Stated Goal: Return home PT Goal Formulation: With patient Time For Goal Achievement: 03/05/14 Potential to Achieve Goals: Good    Frequency Min 3X/week   Barriers to discharge Decreased caregiver support;Inaccessible home environment      Co-evaluation               End of Session Equipment Utilized During Treatment: Gait belt Activity Tolerance: Patient limited by fatigue Patient left: in chair;with call bell/phone within reach;with chair alarm set;with family/visitor present Nurse Communication: Mobility status         Time: 1415-1453 PT Time Calculation (min): 38 min   Charges:   PT Evaluation $Initial PT Evaluation Tier I: 1 Procedure PT Treatments $Gait Training: 23-37 mins   PT G CodesShary Decamp Lyam Provencio 02/26/2014, 5:17 PM  [employer] PT [phone]

## 2014-02-27 DIAGNOSIS — E876 Hypokalemia: Secondary | ICD-10-CM | POA: Diagnosis present

## 2014-02-27 LAB — BASIC METABOLIC PANEL
BUN: 23 mg/dL (ref 6–23)
CHLORIDE: 109 meq/L (ref 96–112)
CO2: 20 mEq/L (ref 19–32)
Calcium: 8.1 mg/dL — ABNORMAL LOW (ref 8.4–10.5)
Creatinine, Ser: 1.47 mg/dL — ABNORMAL HIGH (ref 0.50–1.35)
GFR calc Af Amer: 46 mL/min — ABNORMAL LOW (ref >90)
GFR, EST NON AFRICAN AMERICAN: 40 mL/min — AB (ref >90)
GLUCOSE: 106 mg/dL — AB (ref 70–99)
Potassium: 4.7 mEq/L (ref 3.7–5.3)
Sodium: 140 mEq/L (ref 137–147)

## 2014-02-27 LAB — URINE CULTURE
CULTURE: NO GROWTH
Colony Count: NO GROWTH

## 2014-02-27 NOTE — Plan of Care (Cosign Needed)
RN paged re: Temp 102.9- already on empiric anbx's for PNA and under the care of Dr. Lysle Rubens. Nothing further to add- blood cx's had already been obtained.  Erin Hearing, ANP

## 2014-02-27 NOTE — Progress Notes (Signed)
Physical Therapy Treatment Patient Details Name: Barry Wright MRN: [id number] DOB: December 16, 1919 Today's Date: 2014/03/13    History of Present Illness 80 y.o. male with PMH significant for CKD stage III, CHF, nephrectomy, recently discharge from hospital after treatment of dehydration who presents with cough, fever, generalized weakness. Pt with PNA.    PT Comments    Patient progressing slowly.  May need SNF level rehab prior to d/c home due to weakness and slow progression.  Will follow along.  Follow Up Recommendations  SNF;Supervision/Assistance - 24 hour     Equipment Recommendations  Rolling walker with 5" wheels    Recommendations for Other Services       Precautions / Restrictions Precautions Precautions: Fall Precaution Comments: O2 dependent    Mobility  Bed Mobility Overal bed mobility: Needs Assistance Bed Mobility: Rolling;Sidelying to Sit Rolling: Min assist Sidelying to sit: Mod assist       General bed mobility comments: cues to use rail and assist for lifting trunk upright and for hand placement  Transfers Overall transfer level: Needs assistance Equipment used: Rolling walker (2 wheeled) Transfers: Sit to/from Stand Sit to Stand: Min assist Stand pivot transfers: Min assist       General transfer comment: assist from bed to stand, used walker to turn to pivot to chair.  Flexed in standingt  Ambulation/Gait             General Gait Details: NT due to fatigue with up to chair   Stairs            Wheelchair Mobility    Modified Rankin (Stroke Patients Only)       Balance   Sitting-balance support: Feet supported;Bilateral upper extremity supported Sitting balance-Leahy Scale: Poor     Standing balance support: Bilateral upper extremity supported Standing balance-Leahy Scale: Poor Standing balance comment: needs UE support for sitting as well as standing                    Cognition Arousal/Alertness:  Awake/alert Behavior During Therapy: WFL for tasks assessed/performed Overall Cognitive Status: Within Functional Limits for tasks assessed                      Exercises Other Exercises Other Exercises: seated in chair performed 5 reps with incentive spirometer    General Comments        Pertinent Vitals/Pain No pain complaints    Home Living                      Prior Function            PT Goals (current goals can now be found in the care plan section) Progress towards PT goals: Progressing toward goals    Frequency  Min 3X/week    PT Plan Current plan remains appropriate    Co-evaluation             End of Session Equipment Utilized During Treatment: Gait belt Activity Tolerance: Patient limited by fatigue Patient left: with call bell/phone within reach;with chair alarm set;in chair;with family/visitor present     Time: 6440-3474 PT Time Calculation (min): 27 min  Charges:  $Therapeutic Activity: 23-37 mins                    G Codes:      Max Sane 2014/03/13, 1:30 PM Magda Kiel, [place] 03/13/2014

## 2014-02-27 NOTE — Progress Notes (Signed)
NT notified RN of patient's temp of 102.9 oral. RN notified on-call provider, uncovered pt, and gave PRN Tylenol 1000mg . No new orders at this time.

## 2014-02-27 NOTE — Progress Notes (Signed)
Assessment/Plan: Principal Problem:   PNA (pneumonia) - it is a bit worrisome that he had more fever last night, however, we are still in the first 36 hours. We will continue current abx.  Active Problems:   Chronic kidney disease (CKD), stage III (moderate) - creatinine is stable.    Sepsis - no signs of hypotension, CNS worsening, elevated LFTs.    Hypokalemia   Subjective: Feels weak. Some cough. Overall better than before. Note fever overnight.   Objective:  Vital Signs: Filed Vitals:   02/27/14 0247 02/27/14 0454 02/27/14 0559 02/27/14 0635  BP:  143/67    Pulse:  88    Temp:  102.9 F (39.4 C) 101 F (38.3 C) 98.9 F (37.2 C)  TempSrc:  Oral Oral Oral  Resp:  18    Height:      Weight:  86.6 kg (190 lb 14.7 oz)    SpO2: 93% 93%       EXAM: Lungs clear laterally; congested cough   Intake/Output Summary (Last 24 hours) at 02/27/14 0749 Last data filed at 02/27/14 0705  Gross per 24 hour  Intake 1077.5 ml  Output   1001 ml  Net   76.5 ml    Lab Results:  Recent Labs  02/25/14 1850 02/27/14 0415  NA 139 140  K 4.5 4.7  CL 108 109  CO2 21 20  GLUCOSE 124* 106*  BUN 23 23  CREATININE 1.43* 1.47*  CALCIUM 7.6* 8.1*    Recent Labs  02/25/14 0951  AST 22  ALT 18  ALKPHOS 62  BILITOT 0.5  PROT 4.9*  ALBUMIN 2.6*   No results found for this basename: LIPASE, AMYLASE,  in the last 72 hours  Recent Labs  02/25/14 0951  WBC 9.2  NEUTROABS 7.7  HGB 12.2*  HCT 36.1*  MCV 100.0  PLT 123*   No results found for this basename: CKTOTAL, CKMB, CKMBINDEX, TROPONINI,  in the last 72 hours BNP No results found for this basename: probnp   No results found for this basename: DDIMER,  in the last 72 hours No results found for this basename: HGBA1C,  in the last 72 hours No results found for this basename: CHOL, HDL, LDLCALC, TRIG, CHOLHDL, LDLDIRECT,  in the last 72 hours No results found for this basename: TSH, T4TOTAL, FREET3, T3FREE, THYROIDAB,  in  the last 72 hours No results found for this basename: VITAMINB12, FOLATE, FERRITIN, TIBC, IRON, RETICCTPCT,  in the last 72 hours  Studies/Results: Dg Chest Port 1 View  02/25/2014   CLINICAL DATA:  Weakness with fever.  EXAM: PORTABLE CHEST - 1 VIEW  COMPARISON:  02/20/2014.  FINDINGS: Cardiac enlargement. Prior CABG focal opacity right mid and lower lung zones consistent with pneumonia. No effusion or pneumothorax. Osteopenia. Worsening aeration.  IMPRESSION: New asymmetric right mid and lower lung zone infiltrate consistent with pneumonia.   Electronically Signed   By: Rolla Flatten M.D.   On: 02/25/2014 10:28   Medications: Medications administered in the last 24 hours reviewed.  Current Medication List reviewed.    LOS: 2 days   Tyrone Schimke Internal Medicine @ [facility] [phone]) 02/27/2014, 7:49 AM

## 2014-02-28 ENCOUNTER — Inpatient Hospital Stay (HOSPITAL_COMMUNITY): Payer: Medicare Other

## 2014-02-28 LAB — CULTURE, RESPIRATORY W GRAM STAIN: Culture: NORMAL

## 2014-02-28 LAB — CBC
HCT: 32.3 % — ABNORMAL LOW (ref 39.0–52.0)
Hemoglobin: 11 g/dL — ABNORMAL LOW (ref 13.0–17.0)
MCH: 33.5 pg (ref 26.0–34.0)
MCHC: 34.1 g/dL (ref 30.0–36.0)
MCV: 98.5 fL (ref 78.0–100.0)
Platelets: 151 10*3/uL (ref 150–400)
RBC: 3.28 MIL/uL — ABNORMAL LOW (ref 4.22–5.81)
RDW: 13.1 % (ref 11.5–15.5)
WBC: 12.8 10*3/uL — ABNORMAL HIGH (ref 4.0–10.5)

## 2014-02-28 LAB — COMPREHENSIVE METABOLIC PANEL
ALT: 12 U/L (ref 0–53)
AST: 19 U/L (ref 0–37)
Albumin: 1.9 g/dL — ABNORMAL LOW (ref 3.5–5.2)
Alkaline Phosphatase: 57 U/L (ref 39–117)
BUN: 20 mg/dL (ref 6–23)
CO2: 19 mEq/L (ref 19–32)
Calcium: 8.2 mg/dL — ABNORMAL LOW (ref 8.4–10.5)
Chloride: 104 mEq/L (ref 96–112)
Creatinine, Ser: 1.29 mg/dL (ref 0.50–1.35)
GFR calc Af Amer: 54 mL/min — ABNORMAL LOW (ref >90)
GFR calc non Af Amer: 47 mL/min — ABNORMAL LOW (ref >90)
Glucose, Bld: 103 mg/dL — ABNORMAL HIGH (ref 70–99)
Potassium: 3.9 mEq/L (ref 3.7–5.3)
Sodium: 136 mEq/L — ABNORMAL LOW (ref 137–147)
Total Bilirubin: 0.5 mg/dL (ref 0.3–1.2)
Total Protein: 5.1 g/dL — ABNORMAL LOW (ref 6.0–8.3)

## 2014-02-28 LAB — CULTURE, RESPIRATORY

## 2014-02-28 NOTE — Progress Notes (Signed)
Assessment/Plan: Principal Problem:   PNA (pneumonia) - seems to be improving. CBC pending from today but other labs fine. Will recheck CXR today. Discussed SNF-rehab vs home with 24 hour care with him. Too early to make decision but wanted to set stage for decision making. He is caregiver for his wife and right now they have 24 hour care in the home while he is in hospital.  Active Problems:   Chronic kidney disease (CKD), stage III (moderate)   Sepsis   Hypokalemia   Subjective: Feels better today. Not as washed out. Was up in chair a lot yesterday. PT note reviewed.   Objective:  Vital Signs: Filed Vitals:   02/27/14 1425 02/27/14 2234 02/27/14 2324 02/28/14 0438  BP: 149/75 169/74 150/80 162/71  Pulse: 73 80  82  Temp: 99.1 F (37.3 C) 98.9 F (37.2 C) 98.2 F (36.8 C) 98.9 F (37.2 C)  TempSrc: Oral Oral Oral Oral  Resp: 18 18  20   Height:      Weight:    88.9 kg (195 lb 15.8 oz)  SpO2: 95% 95%  95%     EXAM: LUNGS: clear laterally.    Intake/Output Summary (Last 24 hours) at 02/28/14 0627 Last data filed at 02/28/14 0318  Gross per 24 hour  Intake    240 ml  Output   1800 ml  Net  -1560 ml    Lab Results:  Recent Labs  02/25/14 1850 02/27/14 0415  NA 139 140  K 4.5 4.7  CL 108 109  CO2 21 20  GLUCOSE 124* 106*  BUN 23 23  CREATININE 1.43* 1.47*  CALCIUM 7.6* 8.1*    Recent Labs  02/25/14 0951  AST 22  ALT 18  ALKPHOS 62  BILITOT 0.5  PROT 4.9*  ALBUMIN 2.6*   No results found for this basename: LIPASE, AMYLASE,  in the last 72 hours  Recent Labs  02/25/14 0951  WBC 9.2  NEUTROABS 7.7  HGB 12.2*  HCT 36.1*  MCV 100.0  PLT 123*   No results found for this basename: CKTOTAL, CKMB, CKMBINDEX, TROPONINI,  in the last 72 hours BNP No results found for this basename: probnp   No results found for this basename: DDIMER,  in the last 72 hours No results found for this basename: HGBA1C,  in the last 72 hours No results found for this  basename: CHOL, HDL, LDLCALC, TRIG, CHOLHDL, LDLDIRECT,  in the last 72 hours No results found for this basename: TSH, T4TOTAL, FREET3, T3FREE, THYROIDAB,  in the last 72 hours No results found for this basename: VITAMINB12, FOLATE, FERRITIN, TIBC, IRON, RETICCTPCT,  in the last 72 hours  Studies/Results: No results found. Medications: Medications administered in the last 24 hours reviewed.  Current Medication List reviewed.    LOS: 3 days   Tyrone Schimke Internal Medicine @ [facility] [phone]) 02/28/2014, 6:27 AM

## 2014-02-28 NOTE — Progress Notes (Addendum)
ANTIBIOTIC CONSULT NOTE - INITIAL  Pharmacy Consult for Vancomycin, Levaquin, Aztreonam Indication: HCAP  Allergies  Allergen Reactions  . Penicillins Rash    Patient Measurements: Height: 5\' 10"  (177.8 cm) Weight: 195 lb 15.8 oz (88.9 kg) IBW/kg (Calculated) : 73  Vital Signs: Temp: 98.9 F (37.2 C) (04/21 0438) Temp src: Oral (04/21 0438) BP: 162/71 mmHg (04/21 0438) Pulse Rate: 82 (04/21 0438) Intake/Output from previous day: 04/20 0701 - 04/21 0700 In: 240 [P.O.:240] Out: 2075 [Urine:2075] Intake/Output from this shift: Total I/O In: -  Out: 500 [Urine:500]  Labs:  Recent Labs  02/25/14 1850 02/27/14 0415 02/28/14 0630  WBC  --   --  12.8*  HGB  --   --  11.0*  PLT  --   --  151  CREATININE 1.43* 1.47* 1.29   Estimated Creatinine Clearance: 41.9 ml/min (by C-G formula based on Cr of 1.29). No results found for this basename: VANCOTROUGH, VANCOPEAK, VANCORANDOM, GENTTROUGH, GENTPEAK, GENTRANDOM, TOBRATROUGH, TOBRAPEAK, TOBRARND, AMIKACINPEAK, AMIKACINTROU, AMIKACIN,  in the last 72 hours    Assessment: 80 y.o. male on D#4 broad spectrum antibiotics for HCAP. Afebrile in the past 24 hrs, SCr trending down 1.29, est CrCl 40 ml/min. Currently on levaquin and aztreonam  4/18 Vanc>>4/19 4/18 Levaquin>> 4/18 Aztreonam>>  4/18 Bld x2>>pending 4/18 Urine>>neg 4/19 sputum >> normal flora  Goal of Therapy:  Resolution of infection  Plan:  1. Aztreonam 1gm IV q8h. 2. Levaquin 750mg  IV now then 750mg  IV q48h 4. Will f/u micro data, renal function, pt's clinical condition, vanc trough prn  Maryanna Shape, PharmD, BCPS  Clinical Pharmacist  Pager: [phone]   02/28/2014,12:13 PM

## 2014-03-01 MED ORDER — LEVOFLOXACIN 750 MG PO TABS: 750.0000 mg | ORAL_TABLET | ORAL | Status: DC

## 2014-03-01 MED ORDER — LEVOFLOXACIN 750 MG PO TABS
750.0000 mg | ORAL_TABLET | ORAL | Status: DC
  Administered 2014-03-03 – 2014-03-05 (×2): 750 mg via ORAL
  Filled 2014-03-01 (×2): qty 1

## 2014-03-01 NOTE — Progress Notes (Signed)
PT Cancellation Note  Patient Details Name: RIP HAWES MRN: [id number] DOB: 1919/11/18   Cancelled Treatment:    Reason Eval/Treat Not Completed: Fatigue/lethargy limiting ability to participate; patient sleeping and wife reports needs to rest due to was fatigued when up earlier this afternoon.  Will attempt to see in am.   Max Sane 03/01/2014, 3:16 PM

## 2014-03-01 NOTE — Progress Notes (Addendum)
Assessment/Plan: Principal Problem:   PNA (pneumonia) - improved right base and a bit worse in right middle lobe. I think he is making slow progress. No chagnes for today. Recheck labs in AM Active Problems:   Chronic kidney disease (CKD), stage III (moderate)   Sepsis   Hypokalemia   Malnutrition - note that alb is now 1.9! He needs to eat as much as he possibly can.    Subjective: Feels okay. Was up some yesterday. No dyspnea and not much coughing at this point. Mainly just weak.   Objective:  Vital Signs: Filed Vitals:   02/28/14 0438 02/28/14 1504 02/28/14 2042 03/01/14 0422  BP: 162/71 158/77 138/68 147/70  Pulse: 82 76 78 86  Temp: 98.9 F (37.2 C) 98.9 F (37.2 C) 98.1 F (36.7 C) 99 F (37.2 C)  TempSrc: Oral Oral Oral Oral  Resp: 20 20 20 20   Height:      Weight: 88.9 kg (195 lb 15.8 oz)   84.823 kg (187 lb)  SpO2: 95% 95% 95% 93%     EXAM: LUNGS: clear laterally.    Intake/Output Summary (Last 24 hours) at 03/01/14 0707 Last data filed at 03/01/14 0016  Gross per 24 hour  Intake   1200 ml  Output   1600 ml  Net   -400 ml    Lab Results:  Recent Labs  02/27/14 0415 02/28/14 0630  NA 140 136*  K 4.7 3.9  CL 109 104  CO2 20 19  GLUCOSE 106* 103*  BUN 23 20  CREATININE 1.47* 1.29  CALCIUM 8.1* 8.2*    Recent Labs  02/28/14 0630  AST 19  ALT 12  ALKPHOS 57  BILITOT 0.5  PROT 5.1*  ALBUMIN 1.9*   No results found for this basename: LIPASE, AMYLASE,  in the last 72 hours  Recent Labs  02/28/14 0630  WBC 12.8*  HGB 11.0*  HCT 32.3*  MCV 98.5  PLT 151   No results found for this basename: CKTOTAL, CKMB, CKMBINDEX, TROPONINI,  in the last 72 hours BNP No results found for this basename: probnp   No results found for this basename: DDIMER,  in the last 72 hours No results found for this basename: HGBA1C,  in the last 72 hours No results found for this basename: CHOL, HDL, LDLCALC, TRIG, CHOLHDL, LDLDIRECT,  in the last 72 hours No  results found for this basename: TSH, T4TOTAL, FREET3, T3FREE, THYROIDAB,  in the last 72 hours No results found for this basename: VITAMINB12, FOLATE, FERRITIN, TIBC, IRON, RETICCTPCT,  in the last 72 hours  Studies/Results: Dg Chest 2 View  02/28/2014   CLINICAL DATA:  Fever, pneumonia, history coronary artery disease post CABG and MI, CHF  EXAM: CHEST  2 VIEW  COMPARISON:  02/25/2014  FINDINGS: Enlargement of cardiac silhouette post CABG.  Atherosclerotic calcification aorta.  Slight pulmonary vascular congestion.  Right lung infiltrates improved in lower right lung but increased in right upper lobe since previous exam.  Mild atelectasis at left base.  Bibasilar pleural effusions.  No pneumothorax.  Bones demineralized.  IMPRESSION: Right lung infiltrates consistent with pneumonia, improved at right lower lobe and increased in right upper lobe since previous exam.  Enlargement of cardiac silhouette with pulmonary vascular congestion post CABG.  Bilateral small pleural effusions.   Electronically Signed   By: Lavonia Dana M.D.   On: 02/28/2014 08:16   Medications: Medications administered in the last 24 hours reviewed.  Current Medication List reviewed.  LOS: 4 days   Tyrone Schimke Internal Medicine @ [facility] [phone]) 03/01/2014, 7:07 AM

## 2014-03-01 NOTE — Care Management Note (Signed)
    Page 1 of 1   03/01/2014     2:33:03 PM CARE MANAGEMENT NOTE 03/01/2014  Patient:  Barry Wright, Barry Wright   Account Number:  [id number]  Date Initiated:  03/01/2014  Documentation initiated by:  Tomi Bamberger  Subjective/Objective Assessment:   dx sepsis  admit- lives with wife and has 24 hr care.     Action/Plan:   pt eval- rec snf   Anticipated DC Date:  03/02/2014   Anticipated DC Plan:  SKILLED NURSING FACILITY  In-house referral  Clinical Social Worker      DC Planning Services  CM consult      Choice offered to / List presented to:             Status of service:  In process, will continue to follow Medicare Important Message given?   (If response is "NO", the following Medicare IM given date fields will be blank) Date Medicare IM given:   Date Additional Medicare IM given:    Discharge Disposition:    Per UR Regulation:    If discussed at Long Length of Stay Meetings, dates discussed:    Comments:  03/01/14 [place], BSN [phone] patient lives with family, has 24 hr care at home. Physical therapy recs SNF, CSW following.

## 2014-03-01 NOTE — Clinical Social Work Psychosocial (Signed)
Clinical Social Work Department BRIEF PSYCHOSOCIAL ASSESSMENT 03/01/2014  Patient:  Barry Wright, Barry Wright     Account Number:  [id number]     Admit date:  02/25/2014  Clinical Social Worker:  Lovey Newcomer  Date/Time:  02/28/2014 02:30 PM  Referred by:  Physician  Date Referred:  02/28/2014 Referred for  SNF Placement   Other Referral:   Interview type:  Family Other interview type:   Patient's wife interviewed at bedside as patient was sleeping and per chart is not completely oriented.    PSYCHOSOCIAL DATA Living Status:  WIFE Admitted from facility:   Level of care:   Primary support name:  Lorenda Cahill Primary support relationship to patient:  SPOUSE Degree of support available:   Support is strong.    CURRENT CONCERNS Current Concerns  Post-Acute Placement   Other Concerns:    SOCIAL WORK ASSESSMENT / PLAN CSW met with patient's wife at bedside to complete assessment for SNF. Patient's wife states that she does not plan for patient to go to a SNF at discharge. She states the she and her husband have 24 hr care at home and she plans to bring her husband home at discharge. She states they have a "service" that comes in from 9am to 5pm everyday and she pays for someone to be with them the rest of the day. CSW asked if it would be ok to have a SNF backup plan in place because CSW senses that wife is unaware of the amount of assistance patient will need at home. Wife states that this will be fine. CSW explained SNF search/placement process and answered wife's questions. Wife has been happy with the care her husband has been receiving while in the hospital.   Assessment/plan status:  Psychosocial Support/Ongoing Assessment of Needs Other assessment/ plan:   Complete FL2, Fax, PASRR   Information/referral to community resources:   CSW contact information and SNF list given to patient and wife.    PATIENT'S/FAMILY'S RESPONSE TO PLAN OF CARE: Patient sleeping  comfortably at time of assessment. Wife states that she plans to take patient home with HHPT despite PT's recommendation for SNF because she feels they have adequate supports in place at home. Wife is agreeable to a SNF backup plan.       Liz Beach MSW, [place], [place], [phone]

## 2014-03-01 NOTE — Clinical Social Work Note (Addendum)
Clinical Social Work Department CLINICAL SOCIAL WORK PLACEMENT NOTE 03/01/2014  Patient:  Barry Wright, Barry Wright  Account Number:  [id number] [place] date:  02/25/2014  Clinical Social Worker:  Kemper Durie, [place]  Date/time:  02/28/2014 02:00 PM  Clinical Social Work is seeking post-discharge placement for this patient at the following level of care:   [place]   (*CSW will update this form in Epic as items are completed)   02/28/2014  Patient/family provided with [place] Department of Clinical Social Work's list of facilities offering this level of care within the geographic area requested by the patient (or if unable, by the patient's family).  02/28/2014  Patient/family informed of their freedom to choose among providers that offer the needed level of care, that participate in Medicare, Medicaid or managed care program needed by the patient, have an available bed and are willing to accept the patient.  02/28/2014  Patient/family informed of [facility]' ownership interest in [facility], as well as of the fact that they are under no obligation to receive care at this facility.  PASARR submitted to EDS on 02/28/2014 PASARR number received from EDS on 02/28/2014  FL2 transmitted to all facilities in geographic area requested by pt/family on  02/28/2014 FL2 transmitted to all facilities within larger geographic area on   Patient informed that his/her managed care company has contracts with or will negotiate with  certain facilities, including the following:     Patient/family informed of bed offers received:  03/01/2014 Patient chooses bed at [facility] Physician recommends and patient chooses bed at    Patient to be transferred to [facility] on 03/05/2014-Lavanya Roa [place], [place] Patient to be transferred to facility by [facility]-Cavan Bearden [place], LCSWA  The following physician request were entered in Epic:   Additional  Comments:   Liz Beach MSW, [place], [place], [phone]

## 2014-03-01 NOTE — Consult Note (Signed)
[name] for Infectious Disease    Date of Admission:  02/25/2014  Date of Consult:  03/01/2014  Reason for Consult:HCAP Referring Physician: Dr. Maxwell Caul   HPI: Barry Wright is an 80 y.o. male with past medical history significant for polymyalgia rheumatica, coronary disease, CKD, nephrectomy who was recently admitted the hospital nausea vomiting and dehydration on April 13. He improved with hydration had an MRI of the brain that showed small vessel disease. His ultimately discharged back to home but was readmitted on the 18th with cough fever and severe generalized weakness. Blood pressure was in the 80s he responded to fluids. Urine cultures were obtained which were negative blood cultures were obtained which have been no growth to date a sputum culture were sent which has grown normal oropharyngeal flora. He had been started on vancomycin levofloxacin and aztreonam initially with chest x-ray showing the upper and lower lobe pneumonia.  He has been narrowed to aztreonam and levaquin. He had fever still on 02/26/14 to above 102. He feels somewhat better but still profoundly weak       Past Medical History  Diagnosis Date  . Chronic back pain   . Hypercholesteremia   . Hypothyroidism   . CAD (coronary artery disease)   . Chronic kidney disease (CKD), stage III (moderate)   . Polymyalgia rheumatica   . Left bundle branch block   . Hx of CABG   . History of nephrectomy   . Internal carotid artery stenosis   . Myocardial infarction   . CHF (congestive heart failure)   . Stroke     Past Surgical History  Procedure Laterality Date  . Coronary artery bypass graft    . Cholecystectomy    . Kidney surgery      kidney removed  ergies:   Allergies  Allergen Reactions  . Penicillins Rash     Medications: I have reviewed patients current medications as documented in Epic Anti-infectives   Start     Dose/Rate Route Frequency Ordered Stop   02/27/14 1200   levofloxacin (LEVAQUIN) IVPB 750 mg     750 mg 100 mL/hr over 90 Minutes Intravenous Every 48 hours 02/25/14 1024     02/26/14 1000  vancomycin (VANCOCIN) 1,250 mg in sodium chloride 0.9 % 250 mL IVPB  Status:  Discontinued     1,250 mg 166.7 mL/hr over 90 Minutes Intravenous Every 24 hours 02/25/14 1024 02/26/14 0833   02/25/14 1900  aztreonam (AZACTAM) 1 g in dextrose 5 % 50 mL IVPB  Status:  Discontinued     1 g 100 mL/hr over 30 Minutes Intravenous 3 times per day 02/25/14 1024 03/01/14 1419   02/25/14 1000  levofloxacin (LEVAQUIN) IVPB 750 mg     750 mg 100 mL/hr over 90 Minutes Intravenous  Once 02/25/14 0950 02/25/14 1356   02/25/14 1000  aztreonam (AZACTAM) 2 g in dextrose 5 % 50 mL IVPB     2 g 100 mL/hr over 30 Minutes Intravenous  Once 02/25/14 0950 02/25/14 1202   02/25/14 1000  vancomycin (VANCOCIN) IVPB 1000 mg/200 mL premix     1,000 mg 200 mL/hr over 60 Minutes Intravenous  Once 02/25/14 0950 02/25/14 1203      Social History:  reports that he quit smoking about 43 years ago. His smoking use included Cigarettes. He smoked 0.00 packs per day. He has never used smokeless tobacco. He reports that he does not drink alcohol or use illicit drugs.  Family History  Problem Relation Age of Onset  . Other Mother     [place] Flu  . Heart disease Father     As in HPI and primary teams notes otherwise 12 point review of systems is negative  Blood pressure 131/72, pulse 82, temperature 98.4 F (36.9 C), temperature source Oral, resp. rate 18, height _0  (1.778 m), weight 187 lb (84.823 kg), SpO2 95.00%. General: Alert and awake, oriented  Pale HEENT: anicteric sclera,, EOMI, oropharynx clear and without exudate CVS regular rate, normal r,  no murmur rubs or gallops Chest: Diminished breath sounds in the right base ,no wheezing, rales or rhonchi Abdomen: soft nontender, nondistended, normal bowel sounds, Skin: no rashes Neuro: nonfocal, strength and sensation  intact   Results for orders placed during the hospital encounter of 02/25/14 (from the past 48 hour(s))  CBC     Status: Abnormal   Collection Time    02/28/14  6:30 AM      Result Value Ref Range   WBC 12.8 (*) 4.0 - 10.5 K/uL   RBC 3.28 (*) 4.22 - 5.81 MIL/uL   Hemoglobin 11.0 (*) 13.0 - 17.0 g/dL   HCT 32.3 (*) 39.0 - 52.0 %   MCV 98.5  78.0 - 100.0 fL   MCH 33.5  26.0 - 34.0 pg   MCHC 34.1  30.0 - 36.0 g/dL   RDW 13.1  11.5 - 15.5 %   Platelets 151  150 - 400 K/uL  COMPREHENSIVE METABOLIC PANEL     Status: Abnormal   Collection Time    02/28/14  6:30 AM      Result Value Ref Range   Sodium 136 (*) 137 - 147 mEq/L   Potassium 3.9  3.7 - 5.3 mEq/L   Chloride 104  96 - 112 mEq/L   CO2 19  19 - 32 mEq/L   Glucose, Bld 103 (*) 70 - 99 mg/dL   BUN 20  6 - 23 mg/dL   Creatinine, Ser 1.29  0.50 - 1.35 mg/dL   Calcium 8.2 (*) 8.4 - 10.5 mg/dL   Total Protein 5.1 (*) 6.0 - 8.3 g/dL   Albumin 1.9 (*) 3.5 - 5.2 g/dL   AST 19  0 - 37 U/L   ALT 12  0 - 53 U/L   Alkaline Phosphatase 57  39 - 117 U/L   Total Bilirubin 0.5  0.3 - 1.2 mg/dL   GFR calc non Af Amer 47 (*) >90 mL/min   GFR calc Af Amer 54 (*) >90 mL/min   Comment: (NOTE)     The eGFR has been calculated using the CKD EPI equation.     This calculation has not been validated in all clinical situations.     eGFR's persistently <90 mL/min signify possible Chronic Kidney     Disease.      Component Value Date/Time   SDES SPUTUM 02/26/2014 1555   SDES SPUTUM 02/26/2014 1555   SPECREQUEST NONE 02/26/2014 1555   SPECREQUEST NONE 02/26/2014 1555   CULT  Value: NORMAL OROPHARYNGEAL FLORA Performed at [facility] 02/26/2014 1555   REPTSTATUS 02/26/2014 FINAL 02/26/2014 1555   REPTSTATUS 02/28/2014 FINAL 02/26/2014 1555   Dg Chest 2 View  02/28/2014   CLINICAL DATA:  Fever, pneumonia, history coronary artery disease post CABG and MI, CHF  EXAM: CHEST  2 VIEW  COMPARISON:  02/25/2014  FINDINGS: Enlargement of cardiac  silhouette post CABG.  Atherosclerotic calcification aorta.  Slight pulmonary vascular congestion.  Right lung infiltrates  improved in lower right lung but increased in right upper lobe since previous exam.  Mild atelectasis at left base.  Bibasilar pleural effusions.  No pneumothorax.  Bones demineralized.  IMPRESSION: Right lung infiltrates consistent with pneumonia, improved at right lower lobe and increased in right upper lobe since previous exam.  Enlargement of cardiac silhouette with pulmonary vascular congestion post CABG.  Bilateral small pleural effusions.   Electronically Signed   By: Lavonia Dana M.D.   On: 02/28/2014 08:16     Recent Results (from the past 720 hour(s))  URINE CULTURE     Status: None   Collection Time    02/20/14  2:55 PM      Result Value Ref Range Status   Specimen Description URINE, RANDOM   Final   Special Requests ADDED AT 1708 ON [id number]   Final   Culture  Setup Time     Final   Value: 02/20/2014 19:16     Performed at [facility]     Final   Value: NO GROWTH     Performed at [facility]   Culture     Final   Value: NO GROWTH     Performed at [facility]   Report Status 02/21/2014 FINAL   Final  CULTURE, BLOOD (ROUTINE X 2)     Status: None   Collection Time    02/25/14 10:00 AM      Result Value Ref Range Status   Specimen Description BLOOD RIGHT ARM   Final   Special Requests BOTTLES DRAWN AEROBIC AND ANAEROBIC 10CC EACH   Final   Culture  Setup Time     Final   Value: 02/25/2014 17:20     Performed at [facility]   Culture     Final   Value:        BLOOD CULTURE RECEIVED NO GROWTH TO DATE CULTURE WILL BE HELD FOR 5 DAYS BEFORE ISSUING A FINAL NEGATIVE REPORT     Performed at [facility]   Report Status PENDING   Incomplete  CULTURE, BLOOD (ROUTINE X 2)     Status: None   Collection Time    02/25/14 10:15 AM      Result Value Ref Range Status   Specimen Description BLOOD RIGHT HAND    Final   Special Requests BOTTLES DRAWN AEROBIC AND ANAEROBIC 10CC EACH   Final   Culture  Setup Time     Final   Value: 02/25/2014 17:20     Performed at [facility]   Culture     Final   Value:        BLOOD CULTURE RECEIVED NO GROWTH TO DATE CULTURE WILL BE HELD FOR 5 DAYS BEFORE ISSUING A FINAL NEGATIVE REPORT     Performed at [facility]   Report Status PENDING   Incomplete  URINE CULTURE     Status: None   Collection Time    02/25/14  8:20 PM      Result Value Ref Range Status   Specimen Description URINE, RANDOM   Final   Special Requests NONE   Final   Culture  Setup Time     Final   Value: 02/25/2014 00:00     Performed at [facility] Count     Final   Value: NO GROWTH     Performed at [facility]  Final   Value: NO GROWTH     Performed at [facility]   Report Status 02/27/2014 FINAL   Final  CULTURE, EXPECTORATED SPUTUM-ASSESSMENT     Status: None   Collection Time    02/26/14 12:16 AM      Result Value Ref Range Status   Specimen Description SPUTUM   Final   Special Requests Normal   Final   Sputum evaluation     Final   Value: MICROSCOPIC FINDINGS SUGGEST THAT THIS SPECIMEN IS NOT REPRESENTATIVE OF LOWER RESPIRATORY SECRETIONS. PLEASE RECOLLECT.     RESULT CALLED TO, READ BACK BY AND VERIFIED WITH: Carlynn Spry RN [phone] [id number] GREEN R   Report Status 02/26/2014 FINAL   Final  CULTURE, EXPECTORATED SPUTUM-ASSESSMENT     Status: None   Collection Time    02/26/14  3:55 PM      Result Value Ref Range Status   Specimen Description SPUTUM   Final   Special Requests NONE   Final   Sputum evaluation     Final   Value: THIS SPECIMEN IS ACCEPTABLE. RESPIRATORY CULTURE REPORT TO FOLLOW.   Report Status 02/26/2014 FINAL   Final  CULTURE, RESPIRATORY (NON-EXPECTORATED)     Status: None   Collection Time    02/26/14  3:55 PM      Result Value Ref Range Status   Specimen Description SPUTUM   Final   Special  Requests NONE   Final   Gram Stain     Final   Value: ABUNDANT WBC PRESENT, PREDOMINANTLY PMN     FEW SQUAMOUS EPITHELIAL CELLS PRESENT     FEW GRAM POSITIVE COCCI IN PAIRS     Performed at [facility]   Culture     Final   Value: NORMAL OROPHARYNGEAL FLORA     Performed at [facility]   Report Status 02/28/2014 FINAL   Final     Impression/Recommendation  Principal Problem:   PNA (pneumonia) Active Problems:   Chronic kidney disease (CKD), stage III (moderate)   Mild malnutrition   Sepsis   Hypokalemia   TANOR GLASPY is a 80 y.o. male with  multiple medical problems recently no nausea vomiting dehydration now admitted with right upper lower lobe pneumonia on therapy currently with levofloxacin and aztreonam.  #1 healthcare associated pneumonia: Sputum cultures not terribly helpful chest x-ray is a mixed picture now on 2 view showing improvement right lower lobe at slight increased infiltrates right upper lobe. His fever curve certainly has improved over the last 2 days.  I think is reasonable to discontinue his aztreonam and continue him on levofloxacin alone. Because of his renal function he will have already receives 6 days with today's dose of levofloxacin which will last through tomorrow. I will follow him clinically and see how he does on levofloxacin alone. I certainly would not give him more than 10 days of therapy due to risk of C. difficile colitis. IF he can swallow he should be changed to oral levaquin  Should his clinical picture worsen we could brought him back out with a carbapenem and Zyvox  #2 Low risk demographic and age group but will check hep panel and HIV screen as I do with ALL of my patients  #3 Deconditioning and weakness. He will clearly need to go to SNF if he fails to improve substantially.    03/01/2014, 4:36 PM   Thank you so much for this interesting consult  [facility] Infectious  Disease [facility] [phone] (pager) [phone] (office) 03/01/2014, 4:36 PM  [place] 03/01/2014, 4:36 PM

## 2014-03-01 NOTE — Clinical Social Work Placement (Signed)
Clinical Social Work Department CLINICAL SOCIAL WORK PLACEMENT NOTE 03/01/2014  Patient:  Barry Wright, Barry Wright  Account Number:  [id number] [place] date:  02/25/2014  Clinical Social Worker:  Kemper Durie, [place]  Date/time:  02/28/2014 02:00 PM  Clinical Social Work is seeking post-discharge placement for this patient at the following level of care:   [place]   (*CSW will update this form in Epic as items are completed)   02/28/2014  Patient/family provided with [place] Department of Clinical Social Work's list of facilities offering this level of care within the geographic area requested by the patient (or if unable, by the patient's family).  02/28/2014  Patient/family informed of their freedom to choose among providers that offer the needed level of care, that participate in Medicare, Medicaid or managed care program needed by the patient, have an available bed and are willing to accept the patient.  02/28/2014  Patient/family informed of [facility]' ownership interest in [facility], as well as of the fact that they are under no obligation to receive care at this facility.  PASARR submitted to EDS on 02/28/2014 PASARR number received from EDS on 02/28/2014  FL2 transmitted to all facilities in geographic area requested by pt/family on  02/28/2014 FL2 transmitted to all facilities within larger geographic area on   Patient informed that his/her managed care company has contracts with or will negotiate with  certain facilities, including the following:     Patient/family informed of bed offers received:   Patient chooses bed at  Physician recommends and patient chooses bed at    Patient to be transferred to  on   Patient to be transferred to facility by   The following physician request were entered in Epic:   Additional Comments:    Liz Beach MSW, [place], [place], [phone]

## 2014-03-02 DIAGNOSIS — M6281 Muscle weakness (generalized): Secondary | ICD-10-CM

## 2014-03-02 LAB — COMPREHENSIVE METABOLIC PANEL
ALT: 20 U/L (ref 0–53)
AST: 24 U/L (ref 0–37)
Albumin: 1.7 g/dL — ABNORMAL LOW (ref 3.5–5.2)
Alkaline Phosphatase: 49 U/L (ref 39–117)
BUN: 23 mg/dL (ref 6–23)
CO2: 22 mEq/L (ref 19–32)
Calcium: 8 mg/dL — ABNORMAL LOW (ref 8.4–10.5)
Chloride: 104 mEq/L (ref 96–112)
Creatinine, Ser: 1.33 mg/dL (ref 0.50–1.35)
GFR calc Af Amer: 52 mL/min — ABNORMAL LOW (ref >90)
GFR calc non Af Amer: 45 mL/min — ABNORMAL LOW (ref >90)
Glucose, Bld: 106 mg/dL — ABNORMAL HIGH (ref 70–99)
Potassium: 3.8 mEq/L (ref 3.7–5.3)
Sodium: 137 mEq/L (ref 137–147)
Total Bilirubin: 0.3 mg/dL (ref 0.3–1.2)
Total Protein: 4.8 g/dL — ABNORMAL LOW (ref 6.0–8.3)

## 2014-03-02 LAB — CBC
HCT: 32 % — ABNORMAL LOW (ref 39.0–52.0)
Hemoglobin: 10.8 g/dL — ABNORMAL LOW (ref 13.0–17.0)
MCH: 33.2 pg (ref 26.0–34.0)
MCHC: 33.8 g/dL (ref 30.0–36.0)
MCV: 98.5 fL (ref 78.0–100.0)
Platelets: 185 10*3/uL (ref 150–400)
RBC: 3.25 MIL/uL — ABNORMAL LOW (ref 4.22–5.81)
RDW: 12.9 % (ref 11.5–15.5)
WBC: 9 10*3/uL (ref 4.0–10.5)

## 2014-03-02 LAB — HEPATITIS PANEL, ACUTE
HCV AB: NEGATIVE
HEP A IGM: NONREACTIVE
HEP B S AG: NEGATIVE
Hep B C IgM: NONREACTIVE

## 2014-03-02 LAB — LEGIONELLA ANTIGEN, URINE: LEGIONELLA ANTIGEN, URINE: NEGATIVE

## 2014-03-02 NOTE — Progress Notes (Signed)
[facility]  Day # 6 antibiotics Day # 6 levaquin  Subjective: No new complaints   Antibiotics:  Anti-infectives   Start     Dose/Rate Route Frequency Ordered Stop   03/03/14 1230  levofloxacin (LEVAQUIN) tablet 750 mg     750 mg Oral Every 48 hours 03/01/14 1648     03/01/14 1700  levofloxacin (LEVAQUIN) tablet 750 mg  Status:  Discontinued     750 mg Oral Every 48 hours 03/01/14 1647 03/01/14 1648   02/27/14 1200  levofloxacin (LEVAQUIN) IVPB 750 mg  Status:  Discontinued     750 mg 100 mL/hr over 90 Minutes Intravenous Every 48 hours 02/25/14 1024 03/01/14 1647   02/26/14 1000  vancomycin (VANCOCIN) 1,250 mg in sodium chloride 0.9 % 250 mL IVPB  Status:  Discontinued     1,250 mg 166.7 mL/hr over 90 Minutes Intravenous Every 24 hours 02/25/14 1024 02/26/14 0833   02/25/14 1900  aztreonam (AZACTAM) 1 g in dextrose 5 % 50 mL IVPB  Status:  Discontinued     1 g 100 mL/hr over 30 Minutes Intravenous 3 times per day 02/25/14 1024 03/01/14 1419   02/25/14 1000  levofloxacin (LEVAQUIN) IVPB 750 mg     750 mg 100 mL/hr over 90 Minutes Intravenous  Once 02/25/14 0950 02/25/14 1356   02/25/14 1000  aztreonam (AZACTAM) 2 g in dextrose 5 % 50 mL IVPB     2 g 100 mL/hr over 30 Minutes Intravenous  Once 02/25/14 0950 02/25/14 1202   02/25/14 1000  vancomycin (VANCOCIN) IVPB 1000 mg/200 mL premix     1,000 mg 200 mL/hr over 60 Minutes Intravenous  Once 02/25/14 0950 02/25/14 1203      Medications: Scheduled Meds: . aspirin  81 mg Oral Daily  . atorvastatin  20 mg Oral q1800  . clopidogrel  75 mg Oral Daily  . doxazosin  4 mg Oral QHS  . enoxaparin (LOVENOX) injection  40 mg Subcutaneous Q24H  . gabapentin  200 mg Oral QHS  . [START ON 03/03/2014] levofloxacin  750 mg Oral Q48H  . levothyroxine  75 mcg Oral QAC breakfast  . multivitamin with minerals  1 tablet Oral Daily  . polyethylene glycol  17 g Oral Daily  . predniSONE  5 mg Oral Daily    Continuous Infusions: . sodium chloride 75 mL/hr at 03/02/14 0719   PRN Meds:.acetaminophen, nitroGLYCERIN, ondansetron (ZOFRAN) IV, ondansetron, traMADol    Objective: Weight change: 2 lb (0.907 kg)  Intake/Output Summary (Last 24 hours) at 03/02/14 1703 Last data filed at 03/02/14 1309  Gross per 24 hour  Intake    900 ml  Output   2000 ml  Net  -1100 ml   Blood pressure 140/60, pulse 75, temperature 98 F (36.7 C), temperature source Oral, resp. rate 22, height 5\' 10"  (1.778 m), weight 189 lb (85.73 kg), SpO2 98.00%. Temp:  [97.6 F (36.4 C)-98.6 F (37 C)] 98 F (36.7 C) (04/23 1500) Pulse Rate:  [70-91] 75 (04/23 1500) Resp:  [18-24] 22 (04/23 1500) BP: (140-148)/(60-73) 140/60 mmHg (04/23 1500) SpO2:  [89 %-98 %] 98 % (04/23 1500) Weight:  [189 lb (85.73 kg)] 189 lb (85.73 kg) (04/23 0544)  Physical Exam: General: Alert and awake, oriented Pale  HEENT: anicteric sclera,, EOMI, oropharynx clear and without exudate  CVS regular rate, normal r, no murmur rubs or gallops  Chest: clear to ausculation anteriorly ,no wheezing, rales or rhonchi  Abdomen: soft nontender,  nondistended, normal bowel sounds,  Skin few ecchymoses Neuro: nonfocal,  CBC:  Recent Labs Lab 02/25/14 0951 02/28/14 0630 03/02/14 0725  HGB 12.2* 11.0* 10.8*  HCT 36.1* 32.3* 32.0*  PLT 123* 151 185     BMET  Recent Labs  02/28/14 0630 03/02/14 0725  NA 136* 137  K 3.9 3.8  CL 104 104  CO2 19 22  GLUCOSE 103* 106*  BUN 20 23  CREATININE 1.29 1.33  CALCIUM 8.2* 8.0*     Liver Panel   Recent Labs  02/28/14 0630 03/02/14 0725  PROT 5.1* 4.8*  ALBUMIN 1.9* 1.7*  AST 19 24  ALT 12 20  ALKPHOS 57 49  BILITOT 0.5 0.3       Sedimentation Rate No results found for this basename: ESRSEDRATE,  in the last 72 hours C-Reactive Protein No results found for this basename: CRP,  in the last 72 hours  Micro Results: Recent Results (from the past 240 hour(s))  CULTURE,  BLOOD (ROUTINE X 2)     Status: None   Collection Time    02/25/14 10:00 AM      Result Value Ref Range Status   Specimen Description BLOOD RIGHT ARM   Final   Special Requests BOTTLES DRAWN AEROBIC AND ANAEROBIC 10CC EACH   Final   Culture  Setup Time     Final   Value: 02/25/2014 17:20     Performed at [facility]   Culture     Final   Value:        BLOOD CULTURE RECEIVED NO GROWTH TO DATE CULTURE WILL BE HELD FOR 5 DAYS BEFORE ISSUING A FINAL NEGATIVE REPORT     Performed at [facility]   Report Status PENDING   Incomplete  CULTURE, BLOOD (ROUTINE X 2)     Status: None   Collection Time    02/25/14 10:15 AM      Result Value Ref Range Status   Specimen Description BLOOD RIGHT HAND   Final   Special Requests BOTTLES DRAWN AEROBIC AND ANAEROBIC 10CC EACH   Final   Culture  Setup Time     Final   Value: 02/25/2014 17:20     Performed at [facility]   Culture     Final   Value:        BLOOD CULTURE RECEIVED NO GROWTH TO DATE CULTURE WILL BE HELD FOR 5 DAYS BEFORE ISSUING A FINAL NEGATIVE REPORT     Performed at [facility]   Report Status PENDING   Incomplete  URINE CULTURE     Status: None   Collection Time    02/25/14  8:20 PM      Result Value Ref Range Status   Specimen Description URINE, RANDOM   Final   Special Requests NONE   Final   Culture  Setup Time     Final   Value: 02/25/2014 00:00     Performed at [place]     Final   Value: NO GROWTH     Performed at [facility]   Culture     Final   Value: NO GROWTH     Performed at [facility]   Report Status 02/27/2014 FINAL   Final  CULTURE, EXPECTORATED SPUTUM-ASSESSMENT     Status: None   Collection Time    02/26/14 12:16 AM      Result Value Ref Range Status   Specimen Description  SPUTUM   Final   Special Requests Normal   Final   Sputum evaluation     Final   Value: MICROSCOPIC FINDINGS SUGGEST THAT THIS SPECIMEN IS NOT  REPRESENTATIVE OF LOWER RESPIRATORY SECRETIONS. PLEASE RECOLLECT.     RESULT CALLED TO, READ BACK BY AND VERIFIED WITH: Carlynn Spry RN [phone] GREEN R   Report Status 02/26/2014 FINAL   Final  CULTURE, EXPECTORATED SPUTUM-ASSESSMENT     Status: None   Collection Time    02/26/14  3:55 PM      Result Value Ref Range Status   Specimen Description SPUTUM   Final   Special Requests NONE   Final   Sputum evaluation     Final   Value: THIS SPECIMEN IS ACCEPTABLE. RESPIRATORY CULTURE REPORT TO FOLLOW.   Report Status 02/26/2014 FINAL   Final  CULTURE, RESPIRATORY (NON-EXPECTORATED)     Status: None   Collection Time    02/26/14  3:55 PM      Result Value Ref Range Status   Specimen Description SPUTUM   Final   Special Requests NONE   Final   Gram Stain     Final   Value: ABUNDANT WBC PRESENT, PREDOMINANTLY PMN     FEW SQUAMOUS EPITHELIAL CELLS PRESENT     FEW GRAM POSITIVE COCCI IN PAIRS     Performed at [facility]   Culture     Final   Value: NORMAL OROPHARYNGEAL FLORA     Performed at [facility]   Report Status 02/28/2014 FINAL   Final    Studies/Results: No results found.    Assessment/Plan:  Principal Problem:   PNA (pneumonia) Active Problems:   Chronic kidney disease (CKD), stage III (moderate)   Mild malnutrition   Sepsis   Hypokalemia    Barry Wright is a 80 y.o. male with  multiple medical problems recently no nausea vomiting dehydration now admitted with right upper lower lobe  pneumonia on sp therapy with broad spectrum abx now narrowed to  levofloxacin   #1 healthcare associated pneumonia:   --continue levaquin for TWO more Q 48 hour doses which would give him 10 days   #2 Low risk demographic and age group but I checked Hep panel which was negative and hIV which will undoubtedly be negative as well  #3 Deconditioning and weakness. He will clearly need to go to SNF if he fails to improve substantially.   I will sign off for  now  Please call with further questions.    LOS: 5 days   Truman Hayward 03/02/2014, 5:03 PM

## 2014-03-02 NOTE — Progress Notes (Signed)
Physical Therapy Treatment Patient Details Name: [name] MRN: [id number] DOB: 1920-03-24 Today's Date: 2014-03-30    History of Present Illness 80 y.o. male with PMH significant for CKD stage III, CHF, nephrectomy, recently discharge from hospital after treatment of dehydration who presents with cough, fever, generalized weakness. Pt with PNA.    PT Comments    Pt able to perform short distance gait training today, continues with significant fatigue with activity, will benefit from SNF placement for continued PT services.  Follow Up Recommendations  SNF     Equipment Recommendations  None recommended by PT    Recommendations for Other Services       Precautions / Restrictions Precautions Precautions: Fall Restrictions Weight Bearing Restrictions: No    Mobility  Bed Mobility         Supine to sit: Min assist     General bed mobility comments: assist for trunk, increased time, cues for hand placement  Transfers Overall transfer level: Needs assistance Equipment used: Rolling walker (2 wheeled)   Sit to Stand: Min assist         General transfer comment: cues for UE placement, assist for anterior wt shifting, forward flexed posture  Ambulation/Gait Ambulation/Gait assistance: Min guard Ambulation Distance (Feet): 10 Feet (x3) Assistive device: Rolling walker (2 wheeled)     Gait velocity interpretation: <1.8 ft/sec, indicative of risk for recurrent falls General Gait Details: pt able to gait 3 x 10' today with min guard, pt states his back feels better with walking, limited by fatigue.  requires increased seated rest breaks between walks.  able to improve balance with repetition   Stairs            Wheelchair Mobility    Modified Rankin (Stroke Patients Only)       Balance                                    Cognition Arousal/Alertness: Awake/alert Behavior During Therapy: WFL for tasks assessed/performed Overall  Cognitive Status: Within Functional Limits for tasks assessed                      Exercises      General Comments        Pertinent Vitals/Pain Pt reports back pain when lying in bed, improves with mobility    Home Living                      Prior Function            PT Goals (current goals can now be found in the care plan section) Progress towards PT goals: Progressing toward goals    Frequency  Min 3X/week    PT Plan Current plan remains appropriate    Co-evaluation             End of Session Equipment Utilized During Treatment: Gait belt;Oxygen Activity Tolerance: Patient tolerated treatment well Patient left: in chair;with call bell/phone within reach;with chair alarm set     Time: 2458-0998 PT Time Calculation (min): 23 min  Charges:  $Gait Training: 8-22 mins $Therapeutic Activity: 8-22 mins                    G Codes:      Kennith Gain 30-Mar-2014, 10:46 AM

## 2014-03-02 NOTE — Progress Notes (Signed)
Assessment/Plan: Principal Problem:   PNA (pneumonia) - appreciated Dr. Lucianne Lei Dam's help. I am always a bit confused as to what to do with these HCAP's as the patient improves. Will change to Levaquin in AM. Will try to stop O2 today. He will need SNF-rehab. It would be best for him and family if this occurred Sunday or later at earliest. I can do d/c summary tomorrow for possible d/c over Sunday if bed were to become available.  Active Problems:   Chronic kidney disease (CKD), stage III (moderate)   Mild malnutrition - and weakness. We talked about need to eat better and the fact that his pre-existing malnutrition has made this illness more severe. See comments above about rehab.    Sepsis   Hypokalemia   Subjective: Feels okay. No new issues. Tells me that another dtr is coming from [place] on Saturday and will be here on Saturday evening. She is coming mainly to look after wife so he can go to SNF-rehab. He recognizes that he has to go to SNF-rehab to get stronger before trying to go home.   Objective:  Vital Signs: Filed Vitals:   03/01/14 1110 03/01/14 1358 03/01/14 2019 03/02/14 0544  BP:  131/72 146/72 147/72  Pulse:  82 72 91  Temp: 98.5 F (36.9 C) 98.4 F (36.9 C) 98.4 F (36.9 C) 97.6 F (36.4 C)  TempSrc:  Oral Oral Oral  Resp:  18 18 18   Height:      Weight:    85.73 kg (189 lb)  SpO2:  95% 95% 94%     EXAM: LUNGS: occasional squeak. No rhonchi.    Intake/Output Summary (Last 24 hours) at 03/02/14 0757 Last data filed at 03/02/14 0545  Gross per 24 hour  Intake   1100 ml  Output   2350 ml  Net  -1250 ml    Lab Results:  Recent Labs  02/28/14 0630  NA 136*  K 3.9  CL 104  CO2 19  GLUCOSE 103*  BUN 20  CREATININE 1.29  CALCIUM 8.2*    Recent Labs  02/28/14 0630  AST 19  ALT 12  ALKPHOS 57  BILITOT 0.5  PROT 5.1*  ALBUMIN 1.9*   No results found for this basename: LIPASE, AMYLASE,  in the last 72 hours  Recent Labs  02/28/14 0630  WBC 12.8*   HGB 11.0*  HCT 32.3*  MCV 98.5  PLT 151   No results found for this basename: CKTOTAL, CKMB, CKMBINDEX, TROPONINI,  in the last 72 hours BNP No results found for this basename: probnp   No results found for this basename: DDIMER,  in the last 72 hours No results found for this basename: HGBA1C,  in the last 72 hours No results found for this basename: CHOL, HDL, LDLCALC, TRIG, CHOLHDL, LDLDIRECT,  in the last 72 hours No results found for this basename: TSH, T4TOTAL, FREET3, T3FREE, THYROIDAB,  in the last 72 hours No results found for this basename: VITAMINB12, FOLATE, FERRITIN, TIBC, IRON, RETICCTPCT,  in the last 72 hours  Studies/Results: Dg Chest 2 View  02/28/2014   CLINICAL DATA:  Fever, pneumonia, history coronary artery disease post CABG and MI, CHF  EXAM: CHEST  2 VIEW  COMPARISON:  02/25/2014  FINDINGS: Enlargement of cardiac silhouette post CABG.  Atherosclerotic calcification aorta.  Slight pulmonary vascular congestion.  Right lung infiltrates improved in lower right lung but increased in right upper lobe since previous exam.  Mild atelectasis at left base.  Bibasilar pleural effusions.  No pneumothorax.  Bones demineralized.  IMPRESSION: Right lung infiltrates consistent with pneumonia, improved at right lower lobe and increased in right upper lobe since previous exam.  Enlargement of cardiac silhouette with pulmonary vascular congestion post CABG.  Bilateral small pleural effusions.   Electronically Signed   By: Lavonia Dana M.D.   On: 02/28/2014 08:16   Medications: Medications administered in the last 24 hours reviewed.  Current Medication List reviewed.    LOS: 5 days   Barry Wright Internal [facility] @ [facility] [phone]) 03/02/2014, 7:57 AM

## 2014-03-03 ENCOUNTER — Inpatient Hospital Stay (HOSPITAL_COMMUNITY): Payer: Medicare Other

## 2014-03-03 LAB — CULTURE, BLOOD (ROUTINE X 2)
CULTURE: NO GROWTH
Culture: NO GROWTH

## 2014-03-03 LAB — HIV-1 RNA QUANT-NO REFLEX-BLD: HIV-1 RNA Quant, Log: <1.3 {Log} (ref <1.30)

## 2014-03-03 NOTE — Progress Notes (Signed)
Doing well today. No new issues.   Repeat CXR today.   Plan is for Levaquin today and on 4/26 AM. Then, if possible, d/c to SNF-rehab on 4/26. I am doing d/c summary today in case facility can take him on 4/26. If not plan on d/c 4/27. Of course, if something else comes up will need to alter plan.   FL2 is signed.

## 2014-03-03 NOTE — Discharge Summary (Signed)
Physician Discharge Summary  NAME:Barry Wright  EXB:[id number]  DOB: [birth year]   Admit date: 02/25/2014 Discharge date: 03/03/2014  Admitting Diagnosis: Health Care Associated Pneumonia  Discharge Diagnoses:  Active Hospital Problems   Diagnosis Date Noted  . PNA (pneumonia), health care associated - no organism isolated 02/25/2014  . Hypokalemia - resolved 02/27/2014  . Sepsis - resolved 02/25/2014  . Mild malnutrition 02/22/2014  . Chronic kidney disease (CKD), stage III (moderate) 01/03/2013    Resolved Hospital Problems   Diagnosis Date Noted Date Resolved  No resolved problems to display.    Things to follow up in the outpatient setting: Recheck CXR in 3-4 weeks.   Hospital Course: Patient admitted with fever and new right lung infiltrate. He had a prior admission a few days before for profound weakness with no etiology being found. His CXR was clear on that admit. On this admit, he was started on broad spectrum abx.   Over the next three days, he defervesced and he worked with PT. PT recommended SNF rehab to regain strength to be able to return home safely. This was arranged.  I asked ID to see patient as I was unsure of best abx regimen. He was changed to Levaquin only and  completes his abx course on 4/26. He will need no further abx.   Discharge Condition: improved  Consults: ID, PT  Disposition: SNF-rehab     Medication List    ASK your doctor about these medications       acetaminophen 500 MG tablet  Commonly known as:  TYLENOL  Take 1,000 mg by mouth every 8 (eight) hours as needed for pain.     amLODipine 2.5 MG tablet  Commonly known as:  NORVASC  Take 2.5 mg by mouth daily.     aspirin 81 MG tablet  Take 81 mg by mouth daily.     atorvastatin 20 MG tablet  Commonly known as:  LIPITOR  Take 20 mg by mouth daily.     clopidogrel 75 MG tablet  Commonly known as:  PLAVIX  Take 75 mg by mouth daily.     doxazosin 4 MG tablet  Commonly known as:   CARDURA  Take 4 mg by mouth at bedtime.     gabapentin 100 MG capsule  Commonly known as:  NEURONTIN  Take 200 mg by mouth at bedtime.     levothyroxine 75 MCG tablet  Commonly known as:  SYNTHROID, LEVOTHROID  Take 75 mcg by mouth daily.     multivitamins ther. w/minerals Tabs tablet  Take 1 tablet by mouth daily.     nitroGLYCERIN 0.4 MG SL tablet  Commonly known as:  NITROSTAT  Place 0.4 mg under the tongue every 5 (five) minutes as needed for chest pain.     polyethylene glycol packet  Commonly known as:  MIRALAX / GLYCOLAX  Take 17 g by mouth daily.     predniSONE 5 MG tablet  Commonly known as:  DELTASONE  Take 5 mg by mouth daily.     traMADol 50 MG tablet  Commonly known as:  ULTRAM  Take 50 mg by mouth 2 (two) times daily.         Time coordinating discharge: 42 minutes including medication reconciliation,  preparation of discharge papers, and discussion with patient and family    Signed: Horton Finer 03/03/2014, 8:36 AM

## 2014-03-03 NOTE — Progress Notes (Signed)
CSW (Clinical Education officer, [employer]) received voicemail from pt daughter informing they would like to choose between one of two facilities depending on private room availability. CSW contacted facilities and [facility] Wright can offer a private room for pt on Sunday. CSW confirmed bed for pt. CSW left voicemail for pt daughter to inform.  [place], [place]

## 2014-03-04 NOTE — Progress Notes (Signed)
Subjective: Patient doing well no new complaints, his biggest complaint is just fatigue. His daughter present, she would  like for him to be out of bed a little more  Objective: Vital signs in last 24 hours: Temp:  [98 F (36.7 C)-98.4 F (36.9 C)] 98.1 F (36.7 C) (04/25 0556) Pulse Rate:  [65-72] 72 (04/25 0556) Resp:  [18] 18 (04/25 0556) BP: (124-164)/(65-70) 159/70 mmHg (04/25 0556) SpO2:  [94 %-97 %] 94 % (04/25 0556) Weight:  [86.32 kg (190 lb 4.8 oz)] 86.32 kg (190 lb 4.8 oz) (04/25 0556) Weight change: -1.27 kg (-2 lb 12.8 oz) Last BM Date: 03/03/14  Intake/Output from previous day: 04/24 0701 - 04/25 0700 In: 1388.3 [P.O.:862; I.V.:526.3] Out: 3850 [Urine:3850] Intake/Output this shift:    General appearance: alert and cooperative Resp: Moderate air movement bilateral without rales wheezes or rhonchi Cardio: regular rate and rhythm GI: soft, non-tender; bowel sounds normal; no masses,  no organomegaly Extremities: extremities normal, atraumatic, no cyanosis or edema  Lab Results:  No results found for this or any previous visit (from the past 24 hour(s)).    Studies/Results: Dg Chest 2 View  03/03/2014   CLINICAL DATA:  80 year old male with cough and pneumonia. Initial encounter.  EXAM: CHEST  2 VIEW  COMPARISON:  02/28/2014 and earlier.  FINDINGS: Upright AP and lateral views of the chest. Stable lung volumes. Stable cardiomegaly and mediastinal contours. Stable sequelae of CABG. Visualized tracheal air column is within normal limits. No pneumothorax. Moderate bilateral layering pleural effusions are stable. Superimposed course right greater than left pulmonary opacity is stable. Overall ventilation not significantly changed. Stable visualized osseous structures.  IMPRESSION: Stable ventilation with moderate pleural effusions and right greater than left coarse pulmonary opacity compatible with pneumonia.   Electronically Signed   By: Lars Pinks M.D.   On: 03/03/2014  09:44    Medications:  Prior to Admission:  Prescriptions prior to admission  Medication Sig Dispense Refill  . acetaminophen (TYLENOL) 500 MG tablet Take 1,000 mg by mouth every 8 (eight) hours as needed for pain.      Marland Kitchen amLODipine (NORVASC) 2.5 MG tablet Take 2.5 mg by mouth daily.        Marland Kitchen aspirin 81 MG tablet Take 81 mg by mouth daily.      Marland Kitchen atorvastatin (LIPITOR) 20 MG tablet Take 20 mg by mouth daily.      . clopidogrel (PLAVIX) 75 MG tablet Take 75 mg by mouth daily.        Marland Kitchen doxazosin (CARDURA) 4 MG tablet Take 4 mg by mouth at bedtime.      . gabapentin (NEURONTIN) 100 MG capsule Take 200 mg by mouth at bedtime.      Marland Kitchen levothyroxine (SYNTHROID, LEVOTHROID) 75 MCG tablet Take 75 mcg by mouth daily.      . Multiple Vitamins-Minerals (MULTIVITAMINS THER. W/MINERALS) TABS Take 1 tablet by mouth daily.        . nitroGLYCERIN (NITROSTAT) 0.4 MG SL tablet Place 0.4 mg under the tongue every 5 (five) minutes as needed for chest pain.      . polyethylene glycol (MIRALAX / GLYCOLAX) packet Take 17 g by mouth daily.      . predniSONE (DELTASONE) 5 MG tablet Take 5 mg by mouth daily.      . traMADol (ULTRAM) 50 MG tablet Take 50 mg by mouth 2 (two) times daily.       Scheduled: . aspirin  81 mg Oral Daily  . atorvastatin  20 mg Oral q1800  . clopidogrel  75 mg Oral Daily  . doxazosin  4 mg Oral QHS  . enoxaparin (LOVENOX) injection  40 mg Subcutaneous Q24H  . gabapentin  200 mg Oral QHS  . levofloxacin  750 mg Oral Q48H  . levothyroxine  75 mcg Oral QAC breakfast  . multivitamin with minerals  1 tablet Oral Daily  . polyethylene glycol  17 g Oral Daily  . predniSONE  5 mg Oral Daily   Continuous: . sodium chloride 75 mL/hr at 03/03/14 2253   OIT:[id number], nitroGLYCERIN, ondansetron (ZOFRAN) IV, ondansetron, traMADol  Assessment/Plan: Pneumonia, on oral antibiotics. Patient continues to do well. His appetite and strength are slowly improving. Chronic kidney disease stage  III Deconditioning, continue physical therapy Disposition awaiting skilled nursing facility   LOS: 7 days   Kandice Hams 03/04/2014, 9:09 AM

## 2014-03-04 NOTE — Progress Notes (Signed)
ANTIBIOTIC CONSULT NOTE - FOLLOW UP  Pharmacy Consult for Levofloxacin Indication: pneumonia  Allergies  Allergen Reactions  . Penicillins Rash    Patient Measurements: Height: 5\' 10"  (177.8 cm) Weight: 190 lb 4.8 oz (86.32 kg) IBW/kg (Calculated) : 73 Adjusted Body Weight:   Vital Signs: Temp: 98.1 F (36.7 C) (04/25 0556) Temp src: Oral (04/25 0556) BP: 159/70 mmHg (04/25 0556) Pulse Rate: 72 (04/25 0556) Intake/Output from previous day: 04/24 0701 - 04/25 0700 In: 1388.3 [P.O.:862; I.V.:526.3] Out: 3850 [Urine:3850] Intake/Output from this shift:    Labs:  Recent Labs  03/02/14 0725  WBC 9.0  HGB 10.8*  PLT 185  CREATININE 1.33   Estimated Creatinine Clearance: 37.4 ml/min (by C-G formula based on Cr of 1.33). No results found for this basename: VANCOTROUGH, Corlis Leak, VANCORANDOM, [place], GENTPEAK, GENTRANDOM, TOBRATROUGH, TOBRAPEAK, TOBRARND, AMIKACINPEAK, AMIKACINTROU, AMIKACIN,  in the last 72 hours   Microbiology: Recent Results (from the past 720 hour(s))  URINE CULTURE     Status: None   Collection Time    02/20/14  2:55 PM      Result Value Ref Range Status   Specimen Description URINE, RANDOM   Final   Special Requests ADDED AT 1708 ON [id number]   Final   Culture  Setup Time     Final   Value: 02/20/2014 19:16     Performed at [facility]     Final   Value: NO GROWTH     Performed at [facility]   Culture     Final   Value: NO GROWTH     Performed at [facility]   Report Status 02/21/2014 FINAL   Final  CULTURE, BLOOD (ROUTINE X 2)     Status: None   Collection Time    02/25/14 10:00 AM      Result Value Ref Range Status   Specimen Description BLOOD RIGHT ARM   Final   Special Requests BOTTLES DRAWN AEROBIC AND ANAEROBIC 10CC EACH   Final   Culture  Setup Time     Final   Value: 02/25/2014 17:20     Performed at [facility]   Culture     Final   Value: NO GROWTH 5 DAYS     Performed  at [facility]   Report Status 03/03/2014 FINAL   Final  CULTURE, BLOOD (ROUTINE X 2)     Status: None   Collection Time    02/25/14 10:15 AM      Result Value Ref Range Status   Specimen Description BLOOD RIGHT HAND   Final   Special Requests BOTTLES DRAWN AEROBIC AND ANAEROBIC 10CC EACH   Final   Culture  Setup Time     Final   Value: 02/25/2014 17:20     Performed at [facility]   Culture     Final   Value: NO GROWTH 5 DAYS     Performed at [facility]   Report Status 03/03/2014 FINAL   Final  URINE CULTURE     Status: None   Collection Time    02/25/14  8:20 PM      Result Value Ref Range Status   Specimen Description URINE, RANDOM   Final   Special Requests NONE   Final   Culture  Setup Time     Final   Value: 02/25/2014 00:00     Performed at Home  Final   Value: NO GROWTH     Performed at [facility]   Culture     Final   Value: NO GROWTH     Performed at [facility]   Report Status 02/27/2014 FINAL   Final  CULTURE, EXPECTORATED SPUTUM-ASSESSMENT     Status: None   Collection Time    02/26/14 12:16 AM      Result Value Ref Range Status   Specimen Description SPUTUM   Final   Special Requests Normal   Final   Sputum evaluation     Final   Value: MICROSCOPIC FINDINGS SUGGEST THAT THIS SPECIMEN IS NOT REPRESENTATIVE OF LOWER RESPIRATORY SECRETIONS. PLEASE RECOLLECT.     RESULT CALLED TO, READ BACK BY AND VERIFIED WITH: Carlynn Spry RN [phone] GREEN R   Report Status 02/26/2014 FINAL   Final  CULTURE, EXPECTORATED SPUTUM-ASSESSMENT     Status: None   Collection Time    02/26/14  3:55 PM      Result Value Ref Range Status   Specimen Description SPUTUM   Final   Special Requests NONE   Final   Sputum evaluation     Final   Value: THIS SPECIMEN IS ACCEPTABLE. RESPIRATORY CULTURE REPORT TO FOLLOW.   Report Status 02/26/2014 FINAL   Final  CULTURE, RESPIRATORY (NON-EXPECTORATED)      Status: None   Collection Time    02/26/14  3:55 PM      Result Value Ref Range Status   Specimen Description SPUTUM   Final   Special Requests NONE   Final   Gram Stain     Final   Value: ABUNDANT WBC PRESENT, PREDOMINANTLY PMN     FEW SQUAMOUS EPITHELIAL CELLS PRESENT     FEW GRAM POSITIVE COCCI IN PAIRS     Performed at [facility]   Culture     Final   Value: NORMAL OROPHARYNGEAL FLORA     Performed at [facility]   Report Status 02/28/2014 FINAL   Final    Anti-infectives   Start     Dose/Rate Route Frequency Ordered Stop   03/03/14 1230  levofloxacin (LEVAQUIN) tablet 750 mg     750 mg Oral Every 48 hours 03/01/14 1648     03/01/14 1700  levofloxacin (LEVAQUIN) tablet 750 mg  Status:  Discontinued     750 mg Oral Every 48 hours 03/01/14 1647 03/01/14 1648   02/27/14 1200  levofloxacin (LEVAQUIN) IVPB 750 mg  Status:  Discontinued     750 mg 100 mL/hr over 90 Minutes Intravenous Every 48 hours 02/25/14 1024 03/01/14 1647   02/26/14 1000  vancomycin (VANCOCIN) 1,250 mg in sodium chloride 0.9 % 250 mL IVPB  Status:  Discontinued     1,250 mg 166.7 mL/hr over 90 Minutes Intravenous Every 24 hours 02/25/14 1024 02/26/14 0833   02/25/14 1900  aztreonam (AZACTAM) 1 g in dextrose 5 % 50 mL IVPB  Status:  Discontinued     1 g 100 mL/hr over 30 Minutes Intravenous 3 times per day 02/25/14 1024 03/01/14 1419   02/25/14 1000  levofloxacin (LEVAQUIN) IVPB 750 mg     750 mg 100 mL/hr over 90 Minutes Intravenous  Once 02/25/14 0950 02/25/14 1356   02/25/14 1000  aztreonam (AZACTAM) 2 g in dextrose 5 % 50 mL IVPB     2 g 100 mL/hr over 30 Minutes Intravenous  Once 02/25/14 0950 02/25/14 1202   02/25/14 1000  vancomycin (VANCOCIN)  IVPB 1000 mg/200 mL premix     1,000 mg 200 mL/hr over 60 Minutes Intravenous  Once 02/25/14 0950 02/25/14 1203      Assessment: 80yo male on day#8/10 days max of antibiotics for pneumonia.  WBC wnl and Cr stable on 4/23.  Goal of  Therapy:  resolution of infection  Plan:  1-  Levofloxacin 750mg  PO q48, appropriately adjusted for renal fxn 2-  Pharmacy will sign-off.  Please re-consult as needed.  Gracy Bruins, PharmD Clinical Pharmacist [facility]

## 2014-03-05 DIAGNOSIS — R5383 Other fatigue: Secondary | ICD-10-CM | POA: Diagnosis not present

## 2014-03-05 DIAGNOSIS — N189 Chronic kidney disease, unspecified: Secondary | ICD-10-CM | POA: Diagnosis not present

## 2014-03-05 DIAGNOSIS — R5381 Other malaise: Secondary | ICD-10-CM | POA: Diagnosis not present

## 2014-03-05 DIAGNOSIS — E46 Unspecified protein-calorie malnutrition: Secondary | ICD-10-CM | POA: Diagnosis not present

## 2014-03-05 DIAGNOSIS — K5900 Constipation, unspecified: Secondary | ICD-10-CM | POA: Diagnosis not present

## 2014-03-05 DIAGNOSIS — M6281 Muscle weakness (generalized): Secondary | ICD-10-CM | POA: Diagnosis not present

## 2014-03-05 DIAGNOSIS — E039 Hypothyroidism, unspecified: Secondary | ICD-10-CM | POA: Diagnosis not present

## 2014-03-05 DIAGNOSIS — I119 Hypertensive heart disease without heart failure: Secondary | ICD-10-CM | POA: Diagnosis not present

## 2014-03-05 DIAGNOSIS — J189 Pneumonia, unspecified organism: Secondary | ICD-10-CM | POA: Diagnosis not present

## 2014-03-05 DIAGNOSIS — I10 Essential (primary) hypertension: Secondary | ICD-10-CM | POA: Diagnosis not present

## 2014-03-05 DIAGNOSIS — R262 Difficulty in walking, not elsewhere classified: Secondary | ICD-10-CM | POA: Diagnosis not present

## 2014-03-05 DIAGNOSIS — R279 Unspecified lack of coordination: Secondary | ICD-10-CM | POA: Diagnosis not present

## 2014-03-05 DIAGNOSIS — A419 Sepsis, unspecified organism: Secondary | ICD-10-CM | POA: Diagnosis not present

## 2014-03-05 DIAGNOSIS — E441 Mild protein-calorie malnutrition: Secondary | ICD-10-CM | POA: Diagnosis not present

## 2014-03-05 DIAGNOSIS — I2510 Atherosclerotic heart disease of native coronary artery without angina pectoris: Secondary | ICD-10-CM | POA: Diagnosis not present

## 2014-03-05 MED ORDER — TRAMADOL HCL 50 MG PO TABS
50.0000 mg | ORAL_TABLET | Freq: Two times a day (BID) | ORAL | Status: DC | PRN
Start: 2014-03-05 — End: 2014-03-08

## 2014-03-05 NOTE — Progress Notes (Signed)
Patient was discharged to SNF by MD order; discharged instructions  review and sent with patient  to  facility [facility])  with care notes and prescriptions; IV DIC; skin intact; facility was called and report was given to nurse;  patient will be transported to facility via EMS.

## 2014-03-05 NOTE — Progress Notes (Signed)
Subjective: No problems overnight, patient looking forward to discharge  Objective: Vital signs in last 24 hours: Temp:  [98.1 F (36.7 C)-98.8 F (37.1 C)] 98.8 F (37.1 C) (04/26 0512) Pulse Rate:  [66-70] 69 (04/26 0512) Resp:  [18-20] 18 (04/26 0512) BP: (149-158)/(73-80) 158/80 mmHg (04/26 0512) SpO2:  [96 %] 96 % (04/26 0512) Weight:  [83.5 kg (184 lb 1.4 oz)] 83.5 kg (184 lb 1.4 oz) (04/26 0512) Weight change: -2.819 kg (-6 lb 3.5 oz) Last BM Date: 03/03/14  Intake/Output from previous day: 04/25 0701 - 04/26 0700 In: 1095 [P.O.:420; I.V.:675] Out: 1750 [Urine:1750] Intake/Output this shift: Total I/O In: -  Out: 1650 [Urine:1650]  General appearance: alert and cooperative Resp: clear to auscultation bilaterally Cardio: regular rate and rhythm Extremities: extremities normal, atraumatic, no cyanosis or edema  Lab Results:  No results found for this or any previous visit (from the past 24 hour(s)).    Studies/Results: Dg Chest 2 View  03/03/2014   CLINICAL DATA:  80 year old male with cough and pneumonia. Initial encounter.  EXAM: CHEST  2 VIEW  COMPARISON:  02/28/2014 and earlier.  FINDINGS: Upright AP and lateral views of the chest. Stable lung volumes. Stable cardiomegaly and mediastinal contours. Stable sequelae of CABG. Visualized tracheal air column is within normal limits. No pneumothorax. Moderate bilateral layering pleural effusions are stable. Superimposed course right greater than left pulmonary opacity is stable. Overall ventilation not significantly changed. Stable visualized osseous structures.  IMPRESSION: Stable ventilation with moderate pleural effusions and right greater than left coarse pulmonary opacity compatible with pneumonia.   Electronically Signed   By: Lars Pinks M.D.   On: 03/03/2014 09:44    Medications:  Prior to Admission:  Prescriptions prior to admission  Medication Sig Dispense Refill  . acetaminophen (TYLENOL) 500 MG tablet Take  1,000 mg by mouth every 8 (eight) hours as needed for pain.      Marland Kitchen amLODipine (NORVASC) 2.5 MG tablet Take 2.5 mg by mouth daily.        Marland Kitchen aspirin 81 MG tablet Take 81 mg by mouth daily.      Marland Kitchen atorvastatin (LIPITOR) 20 MG tablet Take 20 mg by mouth daily.      . clopidogrel (PLAVIX) 75 MG tablet Take 75 mg by mouth daily.        Marland Kitchen doxazosin (CARDURA) 4 MG tablet Take 4 mg by mouth at bedtime.      . gabapentin (NEURONTIN) 100 MG capsule Take 200 mg by mouth at bedtime.      Marland Kitchen levothyroxine (SYNTHROID, LEVOTHROID) 75 MCG tablet Take 75 mcg by mouth daily.      . Multiple Vitamins-Minerals (MULTIVITAMINS THER. W/MINERALS) TABS Take 1 tablet by mouth daily.        . nitroGLYCERIN (NITROSTAT) 0.4 MG SL tablet Place 0.4 mg under the tongue every 5 (five) minutes as needed for chest pain.      . polyethylene glycol (MIRALAX / GLYCOLAX) packet Take 17 g by mouth daily.      . predniSONE (DELTASONE) 5 MG tablet Take 5 mg by mouth daily.      . traMADol (ULTRAM) 50 MG tablet Take 50 mg by mouth 2 (two) times daily.       Scheduled: . aspirin  81 mg Oral Daily  . atorvastatin  20 mg Oral q1800  . clopidogrel  75 mg Oral Daily  . doxazosin  4 mg Oral QHS  . enoxaparin (LOVENOX) injection  40 mg Subcutaneous Q24H  .  gabapentin  200 mg Oral QHS  . levofloxacin  750 mg Oral Q48H  . levothyroxine  75 mcg Oral QAC breakfast  . multivitamin with minerals  1 tablet Oral Daily  . polyethylene glycol  17 g Oral Daily  . predniSONE  5 mg Oral Daily   Continuous: . sodium chloride 75 mL/hr at 03/05/14 0210   LXB:[id number], nitroGLYCERIN, ondansetron (ZOFRAN) IV, ondansetron, traMADol  Assessment/Plan: Principal Problem:   PNA (pneumonia) Active Problems:   Chronic kidney disease (CKD), stage III (moderate)   Mild malnutrition   Sepsis, resolved   Hypokalemia, resolved  Medically stable for discharge nursing facility     LOS: 8 days   Kandice Hams 03/05/2014, 9:19 AM

## 2014-03-05 NOTE — Clinical Social Work Note (Signed)
CSW informed patient is ready for D/C to SNF bed at [facility].  CSW contacted [facility] and confirmed bed availability. CSW contacted patient's daughter Neoma Laming and informed her of d/c plan. Patient's daughter agreeable to plans. CSW met with patient and informed him of d/c plan. Patient agreeable to plans. CSW prepared d/c packet and placed in patient's shadow chart.  CSW made RN aware and provided her with room and report number. CSW to arrange transportion via EMS. No further needs identified. CSW signing off.    [place], [place] Weekend Clinical Social Worker [phone]

## 2014-03-06 ENCOUNTER — Other Ambulatory Visit: Payer: Self-pay | Admitting: *Deleted

## 2014-03-06 MED ORDER — TRAMADOL HCL 50 MG PO TABS: ORAL_TABLET | ORAL | Status: DC

## 2014-03-06 NOTE — Telephone Encounter (Signed)
[facility] 

## 2014-03-07 ENCOUNTER — Non-Acute Institutional Stay (SKILLED_NURSING_FACILITY): Payer: Medicare Other | Admitting: Adult Health

## 2014-03-07 DIAGNOSIS — E039 Hypothyroidism, unspecified: Secondary | ICD-10-CM

## 2014-03-07 DIAGNOSIS — I2510 Atherosclerotic heart disease of native coronary artery without angina pectoris: Secondary | ICD-10-CM

## 2014-03-07 DIAGNOSIS — A419 Sepsis, unspecified organism: Secondary | ICD-10-CM

## 2014-03-07 DIAGNOSIS — R5381 Other malaise: Secondary | ICD-10-CM

## 2014-03-07 DIAGNOSIS — E440 Moderate protein-calorie malnutrition: Secondary | ICD-10-CM

## 2014-03-07 DIAGNOSIS — J189 Pneumonia, unspecified organism: Secondary | ICD-10-CM | POA: Diagnosis not present

## 2014-03-07 DIAGNOSIS — M353 Polymyalgia rheumatica: Secondary | ICD-10-CM

## 2014-03-07 DIAGNOSIS — K5900 Constipation, unspecified: Secondary | ICD-10-CM

## 2014-03-07 DIAGNOSIS — I10 Essential (primary) hypertension: Secondary | ICD-10-CM | POA: Diagnosis not present

## 2014-03-07 DIAGNOSIS — N183 Chronic kidney disease, stage 3 unspecified: Secondary | ICD-10-CM

## 2014-03-08 ENCOUNTER — Encounter: Payer: Self-pay | Admitting: Adult Health

## 2014-03-08 DIAGNOSIS — I10 Essential (primary) hypertension: Secondary | ICD-10-CM | POA: Insufficient documentation

## 2014-03-08 DIAGNOSIS — R5381 Other malaise: Secondary | ICD-10-CM | POA: Insufficient documentation

## 2014-03-08 DIAGNOSIS — E440 Moderate protein-calorie malnutrition: Secondary | ICD-10-CM | POA: Insufficient documentation

## 2014-03-08 DIAGNOSIS — K5900 Constipation, unspecified: Secondary | ICD-10-CM | POA: Insufficient documentation

## 2014-03-08 DIAGNOSIS — E039 Hypothyroidism, unspecified: Secondary | ICD-10-CM | POA: Insufficient documentation

## 2014-03-08 NOTE — Progress Notes (Signed)
Patient ID: Barry Wright, male   DOB: June 20, 1920, 80 y.o.   MRN: [id number]     [facility]  Allergies  Allergen Reactions  . Penicillins Rash    Chief Complaint  Patient presents with  . Hospitalization Follow-up    HPI:  He has been hospitalized for pneumonia and sepsis. He is here for short term rehab with his goal to return home. He is presently using 02; but states he was not 02 dependent prior to his hospitalization. He is not voicing any complaints the nursing staff is not voicing any concerns.   Past Medical History  Diagnosis Date  . Chronic back pain   . Hypercholesteremia   . Hypothyroidism   . CAD (coronary artery disease)   . Chronic kidney disease (CKD), stage III (moderate)   . Polymyalgia rheumatica   . Left bundle branch block   . Hx of CABG   . History of nephrectomy   . Internal carotid artery stenosis   . Myocardial infarction   . CHF (congestive heart failure)   . Stroke     Past Surgical History  Procedure Laterality Date  . Coronary artery bypass graft    . Cholecystectomy    . Kidney surgery      kidney removed    VITAL SIGNS BP 119/64  Pulse 64  Ht 5\' 10"  (1.778 m)  Wt 186 lb 11.2 oz (84.687 kg)  BMI 26.79 kg/m2   Patient's Medications  New Prescriptions   No medications on file  Previous Medications   ACETAMINOPHEN (TYLENOL) 500 MG TABLET    Take 1,000 mg by mouth every 8 (eight) hours as needed for pain.   AMLODIPINE (NORVASC) 2.5 MG TABLET    Take 2.5 mg by mouth daily.     ASPIRIN 81 MG TABLET    Take 81 mg by mouth daily.   ATORVASTATIN (LIPITOR) 20 MG TABLET    Take 20 mg by mouth daily.   CLOPIDOGREL (PLAVIX) 75 MG TABLET    Take 75 mg by mouth daily.     DOXAZOSIN (CARDURA) 4 MG TABLET    Take 4 mg by mouth at bedtime.   GABAPENTIN (NEURONTIN) 100 MG CAPSULE    Take 200 mg by mouth at bedtime.   LEVOTHYROXINE (SYNTHROID, LEVOTHROID) 75 MCG TABLET    Take 75 mcg by mouth daily.   MULTIPLE VITAMINS-MINERALS  (MULTIVITAMINS THER. W/MINERALS) TABS    Take 1 tablet by mouth daily.     NITROGLYCERIN (NITROSTAT) 0.4 MG SL TABLET    Place 0.4 mg under the tongue every 5 (five) minutes as needed for chest pain.   POLYETHYLENE GLYCOL (MIRALAX / GLYCOLAX) PACKET    Take 17 g by mouth daily.   PREDNISONE (DELTASONE) 5 MG TABLET    Take 5 mg by mouth daily.   TRAMADOL (ULTRAM) 50 MG TABLET    Take one tablet by mouth twice daily for pains  Modified Medications   No medications on file  Discontinued Medications   TRAMADOL (ULTRAM) 50 MG TABLET    Take 1 tablet (50 mg total) by mouth every 12 (twelve) hours as needed for moderate pain.    SIGNIFICANT DIAGNOSTIC EXAMS   LABS REVIEWED:   02-20-14: wbc 8.6; hgb 13.6; hct 39.8; mcv 98.5 plt 145; glucose 113; bun 29; creat 1.41; k+4.7; na++140; liver normal albumin 3.1; mag 1.5; tsh 2.350 02-25-14: urine culture: no growth 02-28-14: wbc 12.8; hgb 11.0; hct 32.3; mcv 98.5; plt 151; glucose 103; bun  20;creat 2.0; k+3.9; na++136; liver normal albumin 1.9 03-02-14: wbc 9.0; hgb 10.8; hct 32.0; mcv 98.5 ;plt 185; glucose 106; bun 23; creat 1.33; k+3.8; na++137; liver normal albumin 1.7; HIV: non-reactive; hepatitis panel: negative      Review of Systems  Constitutional: Negative for malaise/fatigue.  Respiratory: Negative for cough and shortness of breath.   Cardiovascular: Negative for chest pain, palpitations and leg swelling.  Gastrointestinal: Negative for heartburn, abdominal pain and constipation.  Musculoskeletal: Negative for joint pain and myalgias.  Skin: Negative.   Neurological: Negative for headaches.  Psychiatric/Behavioral: Negative for depression. The patient is not nervous/anxious.       Physical Exam  Constitutional: He appears well-developed and well-nourished. No distress.  overweight  Neck: Neck supple. No JVD present.  Cardiovascular: Normal rate, regular rhythm and intact distal pulses.   Respiratory: Effort normal and breath sounds  normal. No respiratory distress. He has no wheezes.  Using 02  GI: Soft. Bowel sounds are normal. He exhibits no distension. There is no tenderness.  Musculoskeletal: Normal range of motion. He exhibits no edema.  Neurological: He is alert.  Skin: Skin is warm and dry. He is not diaphoretic.  Psychiatric: He has a normal mood and affect.       ASSESSMENT/ PLAN:  1. Pneumonia/sepsis: he has completed his abt; will wean him off his 02 a tolerated and will continue to monitor his status   2. Polymyalgia rheumatica: is stable will continue ultram 50 mg twice daily; neurontin 200 mg nightly for lower extremity pain and prednisone 5 mg daily and will monitor   3. Hypertension: will continue norvasc 2.5 mg daily and cardura 4 mg daily and will monitor  4. CAD: no complaints of chest pain present; will continue plavix 75 mg daily; asa 81 mg daily and prn ntg  5. Dyslipidemia: will continue lipitor 20 mg dialy   6. Hypothyroidism; will continue synthroid 75 mcg daily   7. Constipation: will continue miralax daily   8. Chronic kidney disease stage III: without change in status will monitor  9. Malnutrition: his albumin is 1.7 will being prostat 30 cc twice daily and will monitor   10. Physical deconditioning: will continue therapy as directed and will monitor his status.    Time spent with patient 50 minutes.         Ok Edwards NP [facility] Adult [facility]  Contact [phone] Monday through Friday 8am- 5pm  After hours call [phone]

## 2014-03-09 ENCOUNTER — Encounter: Payer: Self-pay | Admitting: Internal Medicine

## 2014-03-09 ENCOUNTER — Non-Acute Institutional Stay (SKILLED_NURSING_FACILITY): Payer: Medicare Other | Admitting: Internal Medicine

## 2014-03-09 DIAGNOSIS — I119 Hypertensive heart disease without heart failure: Secondary | ICD-10-CM | POA: Diagnosis not present

## 2014-03-09 DIAGNOSIS — R5381 Other malaise: Secondary | ICD-10-CM | POA: Diagnosis not present

## 2014-03-09 DIAGNOSIS — J189 Pneumonia, unspecified organism: Secondary | ICD-10-CM

## 2014-03-09 DIAGNOSIS — K5900 Constipation, unspecified: Secondary | ICD-10-CM

## 2014-03-09 DIAGNOSIS — R531 Weakness: Secondary | ICD-10-CM

## 2014-03-09 DIAGNOSIS — D649 Anemia, unspecified: Secondary | ICD-10-CM

## 2014-03-09 DIAGNOSIS — E785 Hyperlipidemia, unspecified: Secondary | ICD-10-CM

## 2014-03-09 DIAGNOSIS — R5383 Other fatigue: Secondary | ICD-10-CM

## 2014-03-09 DIAGNOSIS — M353 Polymyalgia rheumatica: Secondary | ICD-10-CM

## 2014-03-09 DIAGNOSIS — E039 Hypothyroidism, unspecified: Secondary | ICD-10-CM

## 2014-03-09 NOTE — Progress Notes (Signed)
Patient ID: Barry Wright, male   DOB: [birth year], 80 y.o.   MRN: [id number]     [facility]   PCP: Horton Finer, MD  Code Status: full code  Allergies  Allergen Reactions  . Penicillins Rash    Chief Complaint: new admission  HPI:  80 y/o male patient is here for short term rehabilitation after hospital admission from 02/25/14- 03/03/14 with sepsis in setting of pneumonia. He was started on antibiotics , ID was consulted and he was switched to levofloxacin and completed course on 03/05/14. He is seen in his room today. He is on 2l o2 by nasal canula. He feels weak but also feels that his appetite and energy is slowly coming back. Denies any complaints this visit. No new concern from staff.  Review of Systems:  Constitutional: Negative for fever, chills, weight loss, diaphoresis.  HENT: Negative for congestion, hearing loss and sore throat.   Eyes: Negative for eye pain, blurred vision, double vision and discharge.  Respiratory: Negative for sputum production, shortness of breath and wheezing.  has some cough  Cardiovascular: Negative for chest pain, palpitations, orthopnea and leg swelling.  Gastrointestinal: Negative for heartburn, nausea, vomiting, abdominal pain, diarrhea and constipation.  Genitourinary: Negative for dysuria, urgency, frequency, hematuria and flank pain.  Musculoskeletal: Negative for back pain and myalgias.  Skin: Negative for itching and rash. bruises easily Neurological: Negative for tingling, focal weakness and headaches. has dizziness with sudden position change Psychiatric/Behavioral: Negative for depression and memory loss. The patient is not nervous/anxious.     Past Medical History  Diagnosis Date  . Chronic back pain   . Hypercholesteremia   . Hypothyroidism   . CAD (coronary artery disease)   . Chronic kidney disease (CKD), stage III (moderate)   . Polymyalgia rheumatica   . Left bundle branch block   . Hx of CABG   .  History of nephrectomy   . Internal carotid artery stenosis   . Myocardial infarction   . CHF (congestive heart failure)   . Stroke    Past Surgical History  Procedure Laterality Date  . Coronary artery bypass graft    . Cholecystectomy    . Kidney surgery      kidney removed   Social History:   reports that he quit smoking about 43 years ago. His smoking use included Cigarettes. He smoked 0.00 packs per day. He has never used smokeless tobacco. He reports that he does not drink alcohol or use illicit drugs.  Family History  Problem Relation Age of Onset  . Other Mother     [place] Flu  . Heart disease Father     Medications: Patient's Medications  New Prescriptions   No medications on file  Previous Medications   ACETAMINOPHEN (TYLENOL) 500 MG TABLET    Take 1,000 mg by mouth every 8 (eight) hours as needed for pain.   AMLODIPINE (NORVASC) 2.5 MG TABLET    Take 2.5 mg by mouth daily.     ASPIRIN 81 MG TABLET    Take 81 mg by mouth daily.   ATORVASTATIN (LIPITOR) 20 MG TABLET    Take 20 mg by mouth daily.   CLOPIDOGREL (PLAVIX) 75 MG TABLET    Take 75 mg by mouth daily.     DOXAZOSIN (CARDURA) 4 MG TABLET    Take 4 mg by mouth at bedtime.   GABAPENTIN (NEURONTIN) 100 MG CAPSULE    Take 200 mg by mouth at bedtime.  LEVOTHYROXINE (SYNTHROID, LEVOTHROID) 75 MCG TABLET    Take 75 mcg by mouth daily.   MULTIPLE VITAMINS-MINERALS (MULTIVITAMINS THER. W/MINERALS) TABS    Take 1 tablet by mouth daily.     NITROGLYCERIN (NITROSTAT) 0.4 MG SL TABLET    Place 0.4 mg under the tongue every 5 (five) minutes as needed for chest pain.   POLYETHYLENE GLYCOL (MIRALAX / GLYCOLAX) PACKET    Take 17 g by mouth daily.   PREDNISONE (DELTASONE) 5 MG TABLET    Take 5 mg by mouth daily.   TRAMADOL (ULTRAM) 50 MG TABLET    Take one tablet by mouth twice daily for pains  Modified Medications   No medications on file  Discontinued Medications   No medications on file     Physical Exam: Filed  Vitals:   03/09/14 0950  BP: 123/69  Pulse: 65  Temp: 97.1 F (36.2 C)  Resp: 18  Height: 5\' 10"  (1.778 m)  Weight: 186 lb (84.369 kg)  SpO2: 97%    General- elderly male in no acute distress Head- atraumatic, normocephalic Eyes- PERRLA, EOMI, no pallor, no icterus, no discharge Neck- no lymphadenopathy, no thyromegaly, no jugular vein distension, no carotid bruit Throat- moist mucus membrane, normal oropharynx Nose- normal nasal mucosa, no maxillary or frontal sinus tenderness Cardiovascular- normal s1,s2, no murmurs/ rubs/ gallops Respiratory- bilateral poor air entry mainly at bases, no wheeze, no rhonchi, no crackles, no use of accessory muscles, o2 2 l by nasal canula Abdomen- bowel sounds present, soft, non tender, no CVA tenderness Musculoskeletal- able to move all 4 extremities, no spinal and paraspinal tenderness, trace edema in both ankle, weakness is generalized Neurological- no focal deficit Skin- warm and dry, few ecchymoses noted in both arms Psychiatry- alert and oriented to person, place and time, normal mood and affect   Labs reviewed: Basic Metabolic Panel:  Recent Labs  02/20/14 1708  02/27/14 0415 02/28/14 0630 03/02/14 0725  NA  --   < > 140 136* 137  K  --   < > 4.7 3.9 3.8  CL  --   < > 109 104 104  CO2  --   < > 20 19 22   GLUCOSE  --   < > 106* 103* 106*  BUN  --   < > 23 20 23   CREATININE 1.47*  < > 1.47* 1.29 1.33  CALCIUM  --   < > 8.1* 8.2* 8.0*  MG 1.5  --   --   --   --   < > = values in this interval not displayed. Liver Function Tests:  Recent Labs  02/25/14 0951 02/28/14 0630 03/02/14 0725  AST 22 19 24   ALT 18 12 20   ALKPHOS 62 57 49  BILITOT 0.5 0.5 0.3  PROT 4.9* 5.1* 4.8*  ALBUMIN 2.6* 1.9* 1.7*   No results found for this basename: LIPASE, AMYLASE,  in the last 8760 hours No results found for this basename: AMMONIA,  in the last 8760 hours CBC:  Recent Labs  02/20/14 1316  02/25/14 0951 02/28/14 0630  03/02/14 0725  WBC 8.6  < > 9.2 12.8* 9.0  NEUTROABS 7.2  --  7.7  --   --   HGB 13.6  < > 12.2* 11.0* 10.8*  HCT 39.8  < > 36.1* 32.3* 32.0*  MCV 98.5  < > 100.0 98.5 98.5  PLT 145*  < > 123* 151 185  < > = values in this interval not displayed.  cxr-  right lung infiltrate  Assessment/Plan  Generalized weakness Here for STR. Will have him work with PT and OT for strengthening exercises and gait training. Fall precautions. Continue nutritional supplement  Pneumonia Has completed antibiotics. Will wean him off o2 as tolerated. Will get repeat cxr in 3 weeks to assess resolution  Constipation Enema was helpful and patient is now on miralax with prn MOM  Anemia Monitor h/h  PMR Stable, continue prednisone 5 mg daily with ultram 50 mg twice daily and neurontin 200 mg daily   Hypertensive heart disease Is chest pain free. continue cardura 4 mg daily with norvasc 2.5 mg daily, asprin and plavix. Also on prn NTG   Hypothyroidism Stable on synthroid 75 mcg daily   Hyperlipidemia Continue lipitor 20 mg daily and monitor   Family/ staff Communication: reviewed care plan with patient and nursing supervisor   Goals of care: STR   Labs/tests ordered: repeat chest xray in 3 weeks, cbc with diff    Blanchie Serve, MD  [facility] Adult Medicine [phone] (Monday-Friday 8 am - 5 pm) [phone] (afterhours)

## 2014-03-16 ENCOUNTER — Non-Acute Institutional Stay (SKILLED_NURSING_FACILITY): Payer: Medicare Other | Admitting: Adult Health

## 2014-03-16 DIAGNOSIS — I2510 Atherosclerotic heart disease of native coronary artery without angina pectoris: Secondary | ICD-10-CM | POA: Diagnosis not present

## 2014-03-16 DIAGNOSIS — I10 Essential (primary) hypertension: Secondary | ICD-10-CM | POA: Diagnosis not present

## 2014-03-16 DIAGNOSIS — J189 Pneumonia, unspecified organism: Secondary | ICD-10-CM | POA: Diagnosis not present

## 2014-03-16 DIAGNOSIS — R5381 Other malaise: Secondary | ICD-10-CM

## 2014-03-16 DIAGNOSIS — E039 Hypothyroidism, unspecified: Secondary | ICD-10-CM | POA: Diagnosis not present

## 2014-03-20 DIAGNOSIS — N183 Chronic kidney disease, stage 3 unspecified: Secondary | ICD-10-CM | POA: Diagnosis not present

## 2014-03-20 DIAGNOSIS — M353 Polymyalgia rheumatica: Secondary | ICD-10-CM | POA: Diagnosis not present

## 2014-03-20 DIAGNOSIS — I2510 Atherosclerotic heart disease of native coronary artery without angina pectoris: Secondary | ICD-10-CM | POA: Diagnosis not present

## 2014-03-20 DIAGNOSIS — M6281 Muscle weakness (generalized): Secondary | ICD-10-CM | POA: Diagnosis not present

## 2014-03-20 DIAGNOSIS — M545 Low back pain, unspecified: Secondary | ICD-10-CM | POA: Diagnosis not present

## 2014-03-20 DIAGNOSIS — R269 Unspecified abnormalities of gait and mobility: Secondary | ICD-10-CM | POA: Diagnosis not present

## 2014-03-20 DIAGNOSIS — I509 Heart failure, unspecified: Secondary | ICD-10-CM | POA: Diagnosis not present

## 2014-03-26 ENCOUNTER — Encounter: Payer: Self-pay | Admitting: Adult Health

## 2014-03-26 NOTE — Progress Notes (Signed)
Patient ID: Barry Wright, male   DOB: 05/12/1920, 80 y.o.   MRN: [id number]     [facility]  Allergies  Allergen Reactions  . Penicillins Rash     Chief Complaint  Patient presents with  . Discharge Note    HPI:  He is being discharged to home with home health for pt/ot/nursing. He will not need any dme.  He will need prescriptions to be written. He had been hospitalized for pneumonia.    Past Medical History  Diagnosis Date  . Chronic back pain   . Hypercholesteremia   . Hypothyroidism   . CAD (coronary artery disease)   . Chronic kidney disease (CKD), stage III (moderate)   . Polymyalgia rheumatica   . Left bundle branch block   . Hx of CABG   . History of nephrectomy   . Internal carotid artery stenosis   . Myocardial infarction   . CHF (congestive heart failure)   . Stroke     Past Surgical History  Procedure Laterality Date  . Coronary artery bypass graft    . Cholecystectomy    . Kidney surgery      kidney removed    VITAL SIGNS BP 148/88  Pulse 74  Ht 5\' 11"  (1.803 m)  Wt 181 lb 12.8 oz (82.464 kg)  BMI 25.37 kg/m2   Patient's Medications  New Prescriptions   No medications on file  Previous Medications   ACETAMINOPHEN (TYLENOL) 500 MG TABLET    Take 1,000 mg by mouth every 8 (eight) hours as needed for pain.   AMLODIPINE (NORVASC) 2.5 MG TABLET    Take 2.5 mg by mouth daily.     ASPIRIN 81 MG TABLET    Take 81 mg by mouth daily.   ATORVASTATIN (LIPITOR) 20 MG TABLET    Take 20 mg by mouth daily.   CLOPIDOGREL (PLAVIX) 75 MG TABLET    Take 75 mg by mouth daily.     DOXAZOSIN (CARDURA) 4 MG TABLET    Take 4 mg by mouth at bedtime.   GABAPENTIN (NEURONTIN) 100 MG CAPSULE    Take 200 mg by mouth at bedtime.   LEVOTHYROXINE (SYNTHROID, LEVOTHROID) 75 MCG TABLET    Take 75 mcg by mouth daily.   MULTIPLE VITAMINS-MINERALS (MULTIVITAMINS THER. W/MINERALS) TABS    Take 1 tablet by mouth daily.     NITROGLYCERIN (NITROSTAT) 0.4 MG SL TABLET     Place 0.4 mg under the tongue every 5 (five) minutes as needed for chest pain.   POLYETHYLENE GLYCOL (MIRALAX / GLYCOLAX) PACKET    Take 17 g by mouth daily.   PREDNISONE (DELTASONE) 5 MG TABLET    Take 5 mg by mouth daily.   TRAMADOL (ULTRAM) 50 MG TABLET    Take one tablet by mouth twice daily for pains  Modified Medications   No medications on file  Discontinued Medications   No medications on file    SIGNIFICANT DIAGNOSTIC EXAMS  LABS REVIEWED:   02-20-14: wbc 8.6; hgb 13.6; hct 39.8; mcv 98.5 plt 145; glucose 113; bun 29; creat 1.41; k+4.7; na++140; liver normal albumin 3.1; mag 1.5; tsh 2.350 02-25-14: urine culture: no growth 02-28-14: wbc 12.8; hgb 11.0; hct 32.3; mcv 98.5; plt 151; glucose 103; bun 20;creat 2.0; k+3.9; na++136; liver normal albumin 1.9 03-02-14: wbc 9.0; hgb 10.8; hct 32.0; mcv 98.5 ;plt 185; glucose 106; bun 23; creat 1.33; k+3.8; na++137; liver normal albumin 1.7; HIV: non-reactive; hepatitis panel: negative  Review of Systems  Constitutional: Negative for malaise/fatigue.  Respiratory: Negative for cough and shortness of breath.   Cardiovascular: Negative for chest pain, palpitations and leg swelling.  Gastrointestinal: Negative for heartburn, abdominal pain and constipation.  Musculoskeletal: Negative for joint pain and myalgias.  Skin: Negative.   Neurological: Negative for headaches.  Psychiatric/Behavioral: Negative for depression. The patient is not nervous/anxious.       Physical Exam  Constitutional: He appears well-developed and well-nourished. No distress.  overweight  Neck: Neck supple. No JVD present.  Cardiovascular: Normal rate, regular rhythm and intact distal pulses.   Respiratory: Effort normal and breath sounds normal. No respiratory distress. He has no wheezes.  Using 02  GI: Soft. Bowel sounds are normal. He exhibits no distension. There is no tenderness.  Musculoskeletal: Normal range of motion. He exhibits no edema.    Neurological: He is alert.  Skin: Skin is warm and dry. He is not diaphoretic.  Psychiatric: He has a normal mood and affect.      ASSESSMENT/ PLAN:  Will discharge to home with home health for pto/ot/nursing. Will not need dme. His prescriptions have been written.   Time spent with patient 35 minutes

## 2014-04-06 ENCOUNTER — Ambulatory Visit
Admission: RE | Admit: 2014-04-06 | Discharge: 2014-04-06 | Disposition: A | Discharge status: Home / Self Care | Payer: Medicare Other | Source: Ambulatory Visit | Attending: Internal Medicine | Admitting: Internal Medicine

## 2014-04-06 ENCOUNTER — Other Ambulatory Visit: Payer: Self-pay | Admitting: Internal Medicine

## 2014-04-06 DIAGNOSIS — J189 Pneumonia, unspecified organism: Secondary | ICD-10-CM | POA: Diagnosis not present

## 2014-04-06 DIAGNOSIS — M659 Synovitis and tenosynovitis, unspecified: Secondary | ICD-10-CM | POA: Diagnosis not present

## 2014-04-06 DIAGNOSIS — H9190 Unspecified hearing loss, unspecified ear: Secondary | ICD-10-CM | POA: Diagnosis not present

## 2014-04-10 DIAGNOSIS — H902 Conductive hearing loss, unspecified: Secondary | ICD-10-CM | POA: Diagnosis not present

## 2014-04-10 DIAGNOSIS — H6500 Acute serous otitis media, unspecified ear: Secondary | ICD-10-CM | POA: Diagnosis not present

## 2014-04-10 DIAGNOSIS — H905 Unspecified sensorineural hearing loss: Secondary | ICD-10-CM | POA: Diagnosis not present

## 2014-04-17 DIAGNOSIS — R5381 Other malaise: Secondary | ICD-10-CM | POA: Diagnosis not present

## 2014-04-17 DIAGNOSIS — R5383 Other fatigue: Secondary | ICD-10-CM | POA: Diagnosis not present

## 2014-05-09 DIAGNOSIS — M353 Polymyalgia rheumatica: Secondary | ICD-10-CM | POA: Diagnosis not present

## 2014-05-30 DIAGNOSIS — M2550 Pain in unspecified joint: Secondary | ICD-10-CM | POA: Diagnosis not present

## 2014-05-30 DIAGNOSIS — M069 Rheumatoid arthritis, unspecified: Secondary | ICD-10-CM | POA: Diagnosis not present

## 2014-05-30 DIAGNOSIS — Z79899 Other long term (current) drug therapy: Secondary | ICD-10-CM | POA: Diagnosis not present

## 2014-05-30 DIAGNOSIS — R5381 Other malaise: Secondary | ICD-10-CM | POA: Diagnosis not present

## 2014-05-30 DIAGNOSIS — M353 Polymyalgia rheumatica: Secondary | ICD-10-CM | POA: Diagnosis not present

## 2014-06-07 DIAGNOSIS — I10 Essential (primary) hypertension: Secondary | ICD-10-CM | POA: Diagnosis not present

## 2014-06-07 DIAGNOSIS — M353 Polymyalgia rheumatica: Secondary | ICD-10-CM | POA: Diagnosis not present

## 2014-06-19 ENCOUNTER — Ambulatory Visit (INDEPENDENT_AMBULATORY_CARE_PROVIDER_SITE_OTHER): Payer: Medicare Other | Admitting: Interventional Cardiology

## 2014-06-19 ENCOUNTER — Encounter: Payer: Self-pay | Admitting: Interventional Cardiology

## 2014-06-19 VITALS — BP 136/68 | HR 60 | Ht 71.0 in | Wt 186.0 lb

## 2014-06-19 DIAGNOSIS — N183 Chronic kidney disease, stage 3 unspecified: Secondary | ICD-10-CM

## 2014-06-19 DIAGNOSIS — I10 Essential (primary) hypertension: Secondary | ICD-10-CM

## 2014-06-19 DIAGNOSIS — I5032 Chronic diastolic (congestive) heart failure: Secondary | ICD-10-CM | POA: Diagnosis not present

## 2014-06-19 DIAGNOSIS — I25810 Atherosclerosis of coronary artery bypass graft(s) without angina pectoris: Secondary | ICD-10-CM | POA: Diagnosis not present

## 2014-06-19 DIAGNOSIS — I2510 Atherosclerotic heart disease of native coronary artery without angina pectoris: Secondary | ICD-10-CM | POA: Diagnosis not present

## 2014-06-19 NOTE — Patient Instructions (Signed)
Your physician recommends that you continue on your current medications as directed. Please refer to the Current Medication list given to you today.  Your physician wants you to follow-up in: 1 year. You will receive a reminder letter in the mail two months in advance. If you don't receive a letter, please call our office to schedule the follow-up appointment.  

## 2014-06-19 NOTE — Progress Notes (Signed)
Patient ID: Barry Wright, male   DOB: Dec 04, 1919, 80 y.o.   MRN: [id number]    [address], [address], [place]  [address] Phone: [phone] Fax:  [phone]  Date:  06/19/2014   ID:  Barry Wright, DOB 01-27-1920, MRN [id number]  PCP:  Horton Finer, MD   ASSESSMENT:  1. Coronary artery disease without angina 2. Chronic diastolic heart failure, stable 3. Chronic kidney disease, stage III  PLAN:  1. the patient is stable from cardiac standpoint. No change in the current medical regimen is recommended   SUBJECTIVE: Barry Wright is a 80 y.o. male who comes to the office today without a scheduled appointment. He and his wife were to gather. They went to the old office and then were directed here. I felt we should go ahead and see them since it may definitely get here. No active cardiac complaints. This is a yearly followup. His appetite is been stable. His chronic stable lower extremity edema. He denies orthopnea chest pain   Wt Readings from Last 3 Encounters:  06/19/14 186 lb (84.369 kg)  03/16/14 181 lb 12.8 oz (82.464 kg)  03/09/14 186 lb (84.369 kg)     Past Medical History  Diagnosis Date  . Chronic back pain   . Hypercholesteremia   . Hypothyroidism   . CAD (coronary artery disease)   . Chronic kidney disease (CKD), stage III (moderate)   . Polymyalgia rheumatica   . Left bundle branch block   . Hx of CABG   . History of nephrectomy   . Internal carotid artery stenosis   . Myocardial infarction   . CHF (congestive heart failure)   . Stroke     Current Outpatient Prescriptions  Medication Sig Dispense Refill  . acetaminophen (TYLENOL) 500 MG tablet Take 1,000 mg by mouth every 8 (eight) hours as needed for pain.      Marland Kitchen amLODipine (NORVASC) 2.5 MG tablet Take 2.5 mg by mouth daily.        Marland Kitchen aspirin 81 MG tablet Take 81 mg by mouth daily.      Marland Kitchen atorvastatin (LIPITOR) 20 MG tablet Take 20 mg by mouth daily.      . calcium carbonate  (OS-CAL - DOSED IN MG OF ELEMENTAL CALCIUM) 1250 MG tablet Take 1 tablet by mouth daily with breakfast.      . clopidogrel (PLAVIX) 75 MG tablet Take 75 mg by mouth daily.        Marland Kitchen doxazosin (CARDURA) 4 MG tablet Take 4 mg by mouth at bedtime.      . folic acid (FOLVITE) 1 MG tablet Take 1 mg by mouth daily.      Marland Kitchen gabapentin (NEURONTIN) 100 MG capsule Take 200 mg by mouth 2 (two) times daily.       Marland Kitchen levothyroxine (SYNTHROID, LEVOTHROID) 75 MCG tablet Take 75 mcg by mouth daily.      . Methotrexate Sodium (METHOTREXATE PO) Take 4 tablets once a week on Wednesday      . Multiple Vitamins-Minerals (MULTIVITAMINS THER. W/MINERALS) TABS Take 1 tablet by mouth daily.        . nitroGLYCERIN (NITROSTAT) 0.4 MG SL tablet Place 0.4 mg under the tongue every 5 (five) minutes as needed for chest pain.      . polyethylene glycol (MIRALAX / GLYCOLAX) packet Take 17 g by mouth daily as needed.       . predniSONE (DELTASONE) 5 MG tablet Take 10 mg by  mouth daily.       . traMADol (ULTRAM) 50 MG tablet Take one tablet by mouth twice daily for pains  60 tablet  5   No current facility-administered medications for this visit.    Allergies:    Allergies  Allergen Reactions  . Penicillins Rash    Social History:  The patient  reports that he quit smoking about 43 years ago. His smoking use included Cigarettes. He smoked 0.00 packs per day. He has never used smokeless tobacco. He reports that he does not drink alcohol or use illicit drugs.   ROS:  Please see the history of present illness.   No episodes of syncope but he did have pneumonia in January. He has skilled nursing and rehabilitation and has recovered. He and his wife have a sitter for 8 hours each day.   All other systems reviewed and negative.   OBJECTIVE: VS:  BP 136/68  Pulse 60  Ht 5\' 11"  (1.803 m)  Wt 186 lb (84.369 kg)  BMI 25.95 kg/m2 Well nourished, well developed, in no acute distress, elderly and frail HEENT: normal Neck: JVD flat  with the patient sitting at 90. Carotid bruit absent  Cardiac:  normal S1, S2; RRR; no murmur Lungs:  clear to auscultation bilaterally, no wheezing, rhonchi or rales Abd: soft, nontender, no hepatomegaly Ext: Edema 1-2+ bilateral. Pulses 2+ bilateral  Skin: warm and dry Neuro:  CNs 2-12 intact, no focal abnormalities noted  EKG:     right bundle branch block with left axis deviation/inferior infarct. First degree AV block. Poor R-wave progression. There is no change when compared to the prior tracing.    Signed, Illene Labrador III, MD 06/19/2014 3:56 PM

## 2014-06-27 DIAGNOSIS — M353 Polymyalgia rheumatica: Secondary | ICD-10-CM | POA: Diagnosis not present

## 2014-07-14 DIAGNOSIS — I10 Essential (primary) hypertension: Secondary | ICD-10-CM | POA: Diagnosis not present

## 2014-07-14 DIAGNOSIS — I700 Atherosclerosis of aorta: Secondary | ICD-10-CM | POA: Diagnosis not present

## 2014-07-14 DIAGNOSIS — R5381 Other malaise: Secondary | ICD-10-CM | POA: Diagnosis not present

## 2014-07-19 ENCOUNTER — Other Ambulatory Visit (HOSPITAL_COMMUNITY): Payer: Self-pay | Admitting: Geriatric Medicine

## 2014-07-19 DIAGNOSIS — I739 Peripheral vascular disease, unspecified: Secondary | ICD-10-CM

## 2014-07-19 DIAGNOSIS — L98499 Non-pressure chronic ulcer of skin of other sites with unspecified severity: Principal | ICD-10-CM

## 2014-07-20 ENCOUNTER — Ambulatory Visit (HOSPITAL_COMMUNITY): Payer: Medicare Other | Attending: Geriatric Medicine

## 2014-07-20 DIAGNOSIS — I700 Atherosclerosis of aorta: Secondary | ICD-10-CM | POA: Diagnosis not present

## 2014-07-20 DIAGNOSIS — I2510 Atherosclerotic heart disease of native coronary artery without angina pectoris: Secondary | ICD-10-CM | POA: Insufficient documentation

## 2014-07-20 DIAGNOSIS — M899 Disorder of bone, unspecified: Secondary | ICD-10-CM | POA: Diagnosis not present

## 2014-07-20 DIAGNOSIS — I379 Nonrheumatic pulmonary valve disorder, unspecified: Secondary | ICD-10-CM | POA: Insufficient documentation

## 2014-07-20 DIAGNOSIS — E039 Hypothyroidism, unspecified: Secondary | ICD-10-CM | POA: Insufficient documentation

## 2014-07-20 DIAGNOSIS — I079 Rheumatic tricuspid valve disease, unspecified: Secondary | ICD-10-CM | POA: Diagnosis not present

## 2014-07-20 DIAGNOSIS — N183 Chronic kidney disease, stage 3 unspecified: Secondary | ICD-10-CM | POA: Diagnosis not present

## 2014-07-20 DIAGNOSIS — B029 Zoster without complications: Secondary | ICD-10-CM | POA: Insufficient documentation

## 2014-07-20 DIAGNOSIS — M949 Disorder of cartilage, unspecified: Secondary | ICD-10-CM

## 2014-07-20 DIAGNOSIS — I059 Rheumatic mitral valve disease, unspecified: Secondary | ICD-10-CM | POA: Insufficient documentation

## 2014-07-20 DIAGNOSIS — G4730 Sleep apnea, unspecified: Secondary | ICD-10-CM | POA: Insufficient documentation

## 2014-07-20 DIAGNOSIS — E785 Hyperlipidemia, unspecified: Secondary | ICD-10-CM | POA: Insufficient documentation

## 2014-07-20 DIAGNOSIS — L98499 Non-pressure chronic ulcer of skin of other sites with unspecified severity: Secondary | ICD-10-CM

## 2014-07-20 DIAGNOSIS — IMO0002 Reserved for concepts with insufficient information to code with codable children: Secondary | ICD-10-CM | POA: Diagnosis not present

## 2014-07-20 DIAGNOSIS — G459 Transient cerebral ischemic attack, unspecified: Secondary | ICD-10-CM | POA: Insufficient documentation

## 2014-07-20 DIAGNOSIS — N400 Enlarged prostate without lower urinary tract symptoms: Secondary | ICD-10-CM | POA: Diagnosis not present

## 2014-07-20 DIAGNOSIS — I739 Peripheral vascular disease, unspecified: Secondary | ICD-10-CM

## 2014-07-20 NOTE — Progress Notes (Signed)
2D Echo completed. 07/20/2014

## 2014-07-31 DIAGNOSIS — M545 Low back pain, unspecified: Secondary | ICD-10-CM | POA: Diagnosis not present

## 2014-07-31 DIAGNOSIS — M069 Rheumatoid arthritis, unspecified: Secondary | ICD-10-CM | POA: Diagnosis not present

## 2014-07-31 DIAGNOSIS — M353 Polymyalgia rheumatica: Secondary | ICD-10-CM | POA: Diagnosis not present

## 2014-07-31 DIAGNOSIS — M2550 Pain in unspecified joint: Secondary | ICD-10-CM | POA: Diagnosis not present

## 2014-08-08 DIAGNOSIS — M353 Polymyalgia rheumatica: Secondary | ICD-10-CM | POA: Diagnosis not present

## 2014-08-08 DIAGNOSIS — M069 Rheumatoid arthritis, unspecified: Secondary | ICD-10-CM | POA: Diagnosis not present

## 2014-08-08 DIAGNOSIS — M4800 Spinal stenosis, site unspecified: Secondary | ICD-10-CM | POA: Diagnosis not present

## 2014-08-08 DIAGNOSIS — M1990 Unspecified osteoarthritis, unspecified site: Secondary | ICD-10-CM | POA: Diagnosis not present

## 2014-08-08 DIAGNOSIS — M6281 Muscle weakness (generalized): Secondary | ICD-10-CM | POA: Diagnosis not present

## 2014-08-10 DIAGNOSIS — M4800 Spinal stenosis, site unspecified: Secondary | ICD-10-CM | POA: Diagnosis not present

## 2014-08-10 DIAGNOSIS — M069 Rheumatoid arthritis, unspecified: Secondary | ICD-10-CM | POA: Diagnosis not present

## 2014-08-10 DIAGNOSIS — M1990 Unspecified osteoarthritis, unspecified site: Secondary | ICD-10-CM | POA: Diagnosis not present

## 2014-08-10 DIAGNOSIS — M353 Polymyalgia rheumatica: Secondary | ICD-10-CM | POA: Diagnosis not present

## 2014-08-10 DIAGNOSIS — M6281 Muscle weakness (generalized): Secondary | ICD-10-CM | POA: Diagnosis not present

## 2014-08-14 DIAGNOSIS — Z23 Encounter for immunization: Secondary | ICD-10-CM | POA: Diagnosis not present

## 2014-08-15 DIAGNOSIS — M069 Rheumatoid arthritis, unspecified: Secondary | ICD-10-CM | POA: Diagnosis not present

## 2014-08-15 DIAGNOSIS — M353 Polymyalgia rheumatica: Secondary | ICD-10-CM | POA: Diagnosis not present

## 2014-08-15 DIAGNOSIS — M1990 Unspecified osteoarthritis, unspecified site: Secondary | ICD-10-CM | POA: Diagnosis not present

## 2014-08-15 DIAGNOSIS — M4800 Spinal stenosis, site unspecified: Secondary | ICD-10-CM | POA: Diagnosis not present

## 2014-08-15 DIAGNOSIS — M6281 Muscle weakness (generalized): Secondary | ICD-10-CM | POA: Diagnosis not present

## 2014-08-18 DIAGNOSIS — M6281 Muscle weakness (generalized): Secondary | ICD-10-CM | POA: Diagnosis not present

## 2014-08-18 DIAGNOSIS — M069 Rheumatoid arthritis, unspecified: Secondary | ICD-10-CM | POA: Diagnosis not present

## 2014-08-18 DIAGNOSIS — M4800 Spinal stenosis, site unspecified: Secondary | ICD-10-CM | POA: Diagnosis not present

## 2014-08-18 DIAGNOSIS — M353 Polymyalgia rheumatica: Secondary | ICD-10-CM | POA: Diagnosis not present

## 2014-08-18 DIAGNOSIS — M1990 Unspecified osteoarthritis, unspecified site: Secondary | ICD-10-CM | POA: Diagnosis not present

## 2014-08-21 DIAGNOSIS — M1990 Unspecified osteoarthritis, unspecified site: Secondary | ICD-10-CM | POA: Diagnosis not present

## 2014-08-21 DIAGNOSIS — M4800 Spinal stenosis, site unspecified: Secondary | ICD-10-CM | POA: Diagnosis not present

## 2014-08-21 DIAGNOSIS — M6281 Muscle weakness (generalized): Secondary | ICD-10-CM | POA: Diagnosis not present

## 2014-08-21 DIAGNOSIS — M069 Rheumatoid arthritis, unspecified: Secondary | ICD-10-CM | POA: Diagnosis not present

## 2014-08-21 DIAGNOSIS — M353 Polymyalgia rheumatica: Secondary | ICD-10-CM | POA: Diagnosis not present

## 2014-08-23 DIAGNOSIS — M353 Polymyalgia rheumatica: Secondary | ICD-10-CM | POA: Diagnosis not present

## 2014-08-23 DIAGNOSIS — M1990 Unspecified osteoarthritis, unspecified site: Secondary | ICD-10-CM | POA: Diagnosis not present

## 2014-08-23 DIAGNOSIS — M4800 Spinal stenosis, site unspecified: Secondary | ICD-10-CM | POA: Diagnosis not present

## 2014-08-23 DIAGNOSIS — M6281 Muscle weakness (generalized): Secondary | ICD-10-CM | POA: Diagnosis not present

## 2014-08-23 DIAGNOSIS — M069 Rheumatoid arthritis, unspecified: Secondary | ICD-10-CM | POA: Diagnosis not present

## 2014-08-25 DIAGNOSIS — M1990 Unspecified osteoarthritis, unspecified site: Secondary | ICD-10-CM | POA: Diagnosis not present

## 2014-08-25 DIAGNOSIS — M069 Rheumatoid arthritis, unspecified: Secondary | ICD-10-CM | POA: Diagnosis not present

## 2014-08-25 DIAGNOSIS — M4800 Spinal stenosis, site unspecified: Secondary | ICD-10-CM | POA: Diagnosis not present

## 2014-08-25 DIAGNOSIS — M353 Polymyalgia rheumatica: Secondary | ICD-10-CM | POA: Diagnosis not present

## 2014-08-25 DIAGNOSIS — M6281 Muscle weakness (generalized): Secondary | ICD-10-CM | POA: Diagnosis not present

## 2014-08-28 DIAGNOSIS — M4800 Spinal stenosis, site unspecified: Secondary | ICD-10-CM | POA: Diagnosis not present

## 2014-08-28 DIAGNOSIS — M353 Polymyalgia rheumatica: Secondary | ICD-10-CM | POA: Diagnosis not present

## 2014-08-28 DIAGNOSIS — M1990 Unspecified osteoarthritis, unspecified site: Secondary | ICD-10-CM | POA: Diagnosis not present

## 2014-08-28 DIAGNOSIS — M6281 Muscle weakness (generalized): Secondary | ICD-10-CM | POA: Diagnosis not present

## 2014-08-28 DIAGNOSIS — M069 Rheumatoid arthritis, unspecified: Secondary | ICD-10-CM | POA: Diagnosis not present

## 2014-08-30 DIAGNOSIS — M069 Rheumatoid arthritis, unspecified: Secondary | ICD-10-CM | POA: Diagnosis not present

## 2014-08-30 DIAGNOSIS — M6281 Muscle weakness (generalized): Secondary | ICD-10-CM | POA: Diagnosis not present

## 2014-08-30 DIAGNOSIS — M353 Polymyalgia rheumatica: Secondary | ICD-10-CM | POA: Diagnosis not present

## 2014-08-30 DIAGNOSIS — M4800 Spinal stenosis, site unspecified: Secondary | ICD-10-CM | POA: Diagnosis not present

## 2014-08-30 DIAGNOSIS — M1990 Unspecified osteoarthritis, unspecified site: Secondary | ICD-10-CM | POA: Diagnosis not present

## 2014-09-01 DIAGNOSIS — M1990 Unspecified osteoarthritis, unspecified site: Secondary | ICD-10-CM | POA: Diagnosis not present

## 2014-09-01 DIAGNOSIS — M069 Rheumatoid arthritis, unspecified: Secondary | ICD-10-CM | POA: Diagnosis not present

## 2014-09-01 DIAGNOSIS — M4800 Spinal stenosis, site unspecified: Secondary | ICD-10-CM | POA: Diagnosis not present

## 2014-09-01 DIAGNOSIS — M6281 Muscle weakness (generalized): Secondary | ICD-10-CM | POA: Diagnosis not present

## 2014-09-01 DIAGNOSIS — M353 Polymyalgia rheumatica: Secondary | ICD-10-CM | POA: Diagnosis not present

## 2014-09-04 DIAGNOSIS — M069 Rheumatoid arthritis, unspecified: Secondary | ICD-10-CM | POA: Diagnosis not present

## 2014-09-04 DIAGNOSIS — M4800 Spinal stenosis, site unspecified: Secondary | ICD-10-CM | POA: Diagnosis not present

## 2014-09-04 DIAGNOSIS — M6281 Muscle weakness (generalized): Secondary | ICD-10-CM | POA: Diagnosis not present

## 2014-09-04 DIAGNOSIS — M1990 Unspecified osteoarthritis, unspecified site: Secondary | ICD-10-CM | POA: Diagnosis not present

## 2014-09-04 DIAGNOSIS — M353 Polymyalgia rheumatica: Secondary | ICD-10-CM | POA: Diagnosis not present

## 2014-09-06 DIAGNOSIS — M353 Polymyalgia rheumatica: Secondary | ICD-10-CM | POA: Diagnosis not present

## 2014-09-06 DIAGNOSIS — M1990 Unspecified osteoarthritis, unspecified site: Secondary | ICD-10-CM | POA: Diagnosis not present

## 2014-09-06 DIAGNOSIS — M4800 Spinal stenosis, site unspecified: Secondary | ICD-10-CM | POA: Diagnosis not present

## 2014-09-06 DIAGNOSIS — M069 Rheumatoid arthritis, unspecified: Secondary | ICD-10-CM | POA: Diagnosis not present

## 2014-09-06 DIAGNOSIS — M6281 Muscle weakness (generalized): Secondary | ICD-10-CM | POA: Diagnosis not present

## 2014-09-08 DIAGNOSIS — M353 Polymyalgia rheumatica: Secondary | ICD-10-CM | POA: Diagnosis not present

## 2014-09-08 DIAGNOSIS — M4800 Spinal stenosis, site unspecified: Secondary | ICD-10-CM | POA: Diagnosis not present

## 2014-09-08 DIAGNOSIS — M6281 Muscle weakness (generalized): Secondary | ICD-10-CM | POA: Diagnosis not present

## 2014-09-08 DIAGNOSIS — M069 Rheumatoid arthritis, unspecified: Secondary | ICD-10-CM | POA: Diagnosis not present

## 2014-09-08 DIAGNOSIS — M1990 Unspecified osteoarthritis, unspecified site: Secondary | ICD-10-CM | POA: Diagnosis not present

## 2014-09-11 DIAGNOSIS — M6281 Muscle weakness (generalized): Secondary | ICD-10-CM | POA: Diagnosis not present

## 2014-09-11 DIAGNOSIS — M4800 Spinal stenosis, site unspecified: Secondary | ICD-10-CM | POA: Diagnosis not present

## 2014-09-11 DIAGNOSIS — M069 Rheumatoid arthritis, unspecified: Secondary | ICD-10-CM | POA: Diagnosis not present

## 2014-09-11 DIAGNOSIS — M1990 Unspecified osteoarthritis, unspecified site: Secondary | ICD-10-CM | POA: Diagnosis not present

## 2014-09-11 DIAGNOSIS — M353 Polymyalgia rheumatica: Secondary | ICD-10-CM | POA: Diagnosis not present

## 2014-09-12 DIAGNOSIS — M2570 Osteophyte, unspecified joint: Secondary | ICD-10-CM | POA: Diagnosis not present

## 2014-09-12 DIAGNOSIS — B351 Tinea unguium: Secondary | ICD-10-CM | POA: Diagnosis not present

## 2014-09-12 DIAGNOSIS — M79675 Pain in left toe(s): Secondary | ICD-10-CM | POA: Diagnosis not present

## 2014-09-13 DIAGNOSIS — M4800 Spinal stenosis, site unspecified: Secondary | ICD-10-CM | POA: Diagnosis not present

## 2014-09-13 DIAGNOSIS — M1990 Unspecified osteoarthritis, unspecified site: Secondary | ICD-10-CM | POA: Diagnosis not present

## 2014-09-13 DIAGNOSIS — M6281 Muscle weakness (generalized): Secondary | ICD-10-CM | POA: Diagnosis not present

## 2014-09-13 DIAGNOSIS — M069 Rheumatoid arthritis, unspecified: Secondary | ICD-10-CM | POA: Diagnosis not present

## 2014-09-13 DIAGNOSIS — M353 Polymyalgia rheumatica: Secondary | ICD-10-CM | POA: Diagnosis not present

## 2014-09-15 DIAGNOSIS — M353 Polymyalgia rheumatica: Secondary | ICD-10-CM | POA: Diagnosis not present

## 2014-09-15 DIAGNOSIS — M069 Rheumatoid arthritis, unspecified: Secondary | ICD-10-CM | POA: Diagnosis not present

## 2014-09-15 DIAGNOSIS — M6281 Muscle weakness (generalized): Secondary | ICD-10-CM | POA: Diagnosis not present

## 2014-09-15 DIAGNOSIS — M1990 Unspecified osteoarthritis, unspecified site: Secondary | ICD-10-CM | POA: Diagnosis not present

## 2014-09-15 DIAGNOSIS — M4800 Spinal stenosis, site unspecified: Secondary | ICD-10-CM | POA: Diagnosis not present

## 2014-09-18 DIAGNOSIS — M6281 Muscle weakness (generalized): Secondary | ICD-10-CM | POA: Diagnosis not present

## 2014-09-18 DIAGNOSIS — M069 Rheumatoid arthritis, unspecified: Secondary | ICD-10-CM | POA: Diagnosis not present

## 2014-09-18 DIAGNOSIS — M1990 Unspecified osteoarthritis, unspecified site: Secondary | ICD-10-CM | POA: Diagnosis not present

## 2014-09-18 DIAGNOSIS — M4800 Spinal stenosis, site unspecified: Secondary | ICD-10-CM | POA: Diagnosis not present

## 2014-09-18 DIAGNOSIS — M353 Polymyalgia rheumatica: Secondary | ICD-10-CM | POA: Diagnosis not present

## 2014-09-20 DIAGNOSIS — M4800 Spinal stenosis, site unspecified: Secondary | ICD-10-CM | POA: Diagnosis not present

## 2014-09-20 DIAGNOSIS — M6281 Muscle weakness (generalized): Secondary | ICD-10-CM | POA: Diagnosis not present

## 2014-09-20 DIAGNOSIS — M069 Rheumatoid arthritis, unspecified: Secondary | ICD-10-CM | POA: Diagnosis not present

## 2014-09-20 DIAGNOSIS — M353 Polymyalgia rheumatica: Secondary | ICD-10-CM | POA: Diagnosis not present

## 2014-09-20 DIAGNOSIS — M1990 Unspecified osteoarthritis, unspecified site: Secondary | ICD-10-CM | POA: Diagnosis not present

## 2014-09-22 DIAGNOSIS — M353 Polymyalgia rheumatica: Secondary | ICD-10-CM | POA: Diagnosis not present

## 2014-09-22 DIAGNOSIS — M1990 Unspecified osteoarthritis, unspecified site: Secondary | ICD-10-CM | POA: Diagnosis not present

## 2014-09-22 DIAGNOSIS — M069 Rheumatoid arthritis, unspecified: Secondary | ICD-10-CM | POA: Diagnosis not present

## 2014-09-22 DIAGNOSIS — M4800 Spinal stenosis, site unspecified: Secondary | ICD-10-CM | POA: Diagnosis not present

## 2014-09-22 DIAGNOSIS — M6281 Muscle weakness (generalized): Secondary | ICD-10-CM | POA: Diagnosis not present

## 2014-09-25 DIAGNOSIS — M6281 Muscle weakness (generalized): Secondary | ICD-10-CM | POA: Diagnosis not present

## 2014-09-25 DIAGNOSIS — M353 Polymyalgia rheumatica: Secondary | ICD-10-CM | POA: Diagnosis not present

## 2014-09-25 DIAGNOSIS — M069 Rheumatoid arthritis, unspecified: Secondary | ICD-10-CM | POA: Diagnosis not present

## 2014-09-25 DIAGNOSIS — M4800 Spinal stenosis, site unspecified: Secondary | ICD-10-CM | POA: Diagnosis not present

## 2014-09-25 DIAGNOSIS — M1990 Unspecified osteoarthritis, unspecified site: Secondary | ICD-10-CM | POA: Diagnosis not present

## 2014-09-27 DIAGNOSIS — M353 Polymyalgia rheumatica: Secondary | ICD-10-CM | POA: Diagnosis not present

## 2014-09-27 DIAGNOSIS — M6281 Muscle weakness (generalized): Secondary | ICD-10-CM | POA: Diagnosis not present

## 2014-09-27 DIAGNOSIS — M1990 Unspecified osteoarthritis, unspecified site: Secondary | ICD-10-CM | POA: Diagnosis not present

## 2014-09-27 DIAGNOSIS — M4800 Spinal stenosis, site unspecified: Secondary | ICD-10-CM | POA: Diagnosis not present

## 2014-09-27 DIAGNOSIS — M069 Rheumatoid arthritis, unspecified: Secondary | ICD-10-CM | POA: Diagnosis not present

## 2014-09-29 DIAGNOSIS — M353 Polymyalgia rheumatica: Secondary | ICD-10-CM | POA: Diagnosis not present

## 2014-09-29 DIAGNOSIS — M1990 Unspecified osteoarthritis, unspecified site: Secondary | ICD-10-CM | POA: Diagnosis not present

## 2014-09-29 DIAGNOSIS — M069 Rheumatoid arthritis, unspecified: Secondary | ICD-10-CM | POA: Diagnosis not present

## 2014-09-29 DIAGNOSIS — M6281 Muscle weakness (generalized): Secondary | ICD-10-CM | POA: Diagnosis not present

## 2014-09-29 DIAGNOSIS — M4800 Spinal stenosis, site unspecified: Secondary | ICD-10-CM | POA: Diagnosis not present

## 2014-10-02 DIAGNOSIS — M069 Rheumatoid arthritis, unspecified: Secondary | ICD-10-CM | POA: Diagnosis not present

## 2014-10-02 DIAGNOSIS — M6281 Muscle weakness (generalized): Secondary | ICD-10-CM | POA: Diagnosis not present

## 2014-10-02 DIAGNOSIS — M353 Polymyalgia rheumatica: Secondary | ICD-10-CM | POA: Diagnosis not present

## 2014-10-02 DIAGNOSIS — M1990 Unspecified osteoarthritis, unspecified site: Secondary | ICD-10-CM | POA: Diagnosis not present

## 2014-10-02 DIAGNOSIS — M4800 Spinal stenosis, site unspecified: Secondary | ICD-10-CM | POA: Diagnosis not present

## 2014-10-03 DIAGNOSIS — M2550 Pain in unspecified joint: Secondary | ICD-10-CM | POA: Diagnosis not present

## 2014-10-03 DIAGNOSIS — M0609 Rheumatoid arthritis without rheumatoid factor, multiple sites: Secondary | ICD-10-CM | POA: Diagnosis not present

## 2014-10-03 DIAGNOSIS — M353 Polymyalgia rheumatica: Secondary | ICD-10-CM | POA: Diagnosis not present

## 2014-10-03 DIAGNOSIS — M545 Low back pain: Secondary | ICD-10-CM | POA: Diagnosis not present

## 2014-10-06 DIAGNOSIS — M069 Rheumatoid arthritis, unspecified: Secondary | ICD-10-CM | POA: Diagnosis not present

## 2014-10-06 DIAGNOSIS — M6281 Muscle weakness (generalized): Secondary | ICD-10-CM | POA: Diagnosis not present

## 2014-10-06 DIAGNOSIS — M353 Polymyalgia rheumatica: Secondary | ICD-10-CM | POA: Diagnosis not present

## 2014-10-06 DIAGNOSIS — M4800 Spinal stenosis, site unspecified: Secondary | ICD-10-CM | POA: Diagnosis not present

## 2014-10-06 DIAGNOSIS — M1990 Unspecified osteoarthritis, unspecified site: Secondary | ICD-10-CM | POA: Diagnosis not present

## 2014-10-07 DIAGNOSIS — M069 Rheumatoid arthritis, unspecified: Secondary | ICD-10-CM | POA: Diagnosis not present

## 2014-10-07 DIAGNOSIS — M353 Polymyalgia rheumatica: Secondary | ICD-10-CM | POA: Diagnosis not present

## 2014-10-07 DIAGNOSIS — M4800 Spinal stenosis, site unspecified: Secondary | ICD-10-CM | POA: Diagnosis not present

## 2014-10-07 DIAGNOSIS — M6281 Muscle weakness (generalized): Secondary | ICD-10-CM | POA: Diagnosis not present

## 2014-10-07 DIAGNOSIS — M1990 Unspecified osteoarthritis, unspecified site: Secondary | ICD-10-CM | POA: Diagnosis not present

## 2014-10-09 DIAGNOSIS — M1990 Unspecified osteoarthritis, unspecified site: Secondary | ICD-10-CM | POA: Diagnosis not present

## 2014-10-09 DIAGNOSIS — M353 Polymyalgia rheumatica: Secondary | ICD-10-CM | POA: Diagnosis not present

## 2014-10-09 DIAGNOSIS — M6281 Muscle weakness (generalized): Secondary | ICD-10-CM | POA: Diagnosis not present

## 2014-10-09 DIAGNOSIS — M069 Rheumatoid arthritis, unspecified: Secondary | ICD-10-CM | POA: Diagnosis not present

## 2014-10-09 DIAGNOSIS — M4800 Spinal stenosis, site unspecified: Secondary | ICD-10-CM | POA: Diagnosis not present

## 2014-10-11 DIAGNOSIS — M4800 Spinal stenosis, site unspecified: Secondary | ICD-10-CM | POA: Diagnosis not present

## 2014-10-11 DIAGNOSIS — M1990 Unspecified osteoarthritis, unspecified site: Secondary | ICD-10-CM | POA: Diagnosis not present

## 2014-10-11 DIAGNOSIS — M353 Polymyalgia rheumatica: Secondary | ICD-10-CM | POA: Diagnosis not present

## 2014-10-11 DIAGNOSIS — M069 Rheumatoid arthritis, unspecified: Secondary | ICD-10-CM | POA: Diagnosis not present

## 2014-10-11 DIAGNOSIS — M6281 Muscle weakness (generalized): Secondary | ICD-10-CM | POA: Diagnosis not present

## 2014-10-16 DIAGNOSIS — M6281 Muscle weakness (generalized): Secondary | ICD-10-CM | POA: Diagnosis not present

## 2014-10-16 DIAGNOSIS — M069 Rheumatoid arthritis, unspecified: Secondary | ICD-10-CM | POA: Diagnosis not present

## 2014-10-16 DIAGNOSIS — M1990 Unspecified osteoarthritis, unspecified site: Secondary | ICD-10-CM | POA: Diagnosis not present

## 2014-10-16 DIAGNOSIS — M353 Polymyalgia rheumatica: Secondary | ICD-10-CM | POA: Diagnosis not present

## 2014-10-16 DIAGNOSIS — M4800 Spinal stenosis, site unspecified: Secondary | ICD-10-CM | POA: Diagnosis not present

## 2014-10-20 DIAGNOSIS — M6281 Muscle weakness (generalized): Secondary | ICD-10-CM | POA: Diagnosis not present

## 2014-10-20 DIAGNOSIS — M1990 Unspecified osteoarthritis, unspecified site: Secondary | ICD-10-CM | POA: Diagnosis not present

## 2014-10-20 DIAGNOSIS — M353 Polymyalgia rheumatica: Secondary | ICD-10-CM | POA: Diagnosis not present

## 2014-10-20 DIAGNOSIS — M4800 Spinal stenosis, site unspecified: Secondary | ICD-10-CM | POA: Diagnosis not present

## 2014-10-20 DIAGNOSIS — M069 Rheumatoid arthritis, unspecified: Secondary | ICD-10-CM | POA: Diagnosis not present

## 2014-10-23 DIAGNOSIS — M25561 Pain in right knee: Secondary | ICD-10-CM | POA: Diagnosis not present

## 2014-10-23 DIAGNOSIS — M0609 Rheumatoid arthritis without rheumatoid factor, multiple sites: Secondary | ICD-10-CM | POA: Diagnosis not present

## 2014-10-23 DIAGNOSIS — M4800 Spinal stenosis, site unspecified: Secondary | ICD-10-CM | POA: Diagnosis not present

## 2014-10-23 DIAGNOSIS — M1990 Unspecified osteoarthritis, unspecified site: Secondary | ICD-10-CM | POA: Diagnosis not present

## 2014-10-23 DIAGNOSIS — M6281 Muscle weakness (generalized): Secondary | ICD-10-CM | POA: Diagnosis not present

## 2014-10-23 DIAGNOSIS — M545 Low back pain: Secondary | ICD-10-CM | POA: Diagnosis not present

## 2014-10-23 DIAGNOSIS — M069 Rheumatoid arthritis, unspecified: Secondary | ICD-10-CM | POA: Diagnosis not present

## 2014-10-23 DIAGNOSIS — M2550 Pain in unspecified joint: Secondary | ICD-10-CM | POA: Diagnosis not present

## 2014-10-23 DIAGNOSIS — M353 Polymyalgia rheumatica: Secondary | ICD-10-CM | POA: Diagnosis not present

## 2014-10-25 DIAGNOSIS — M069 Rheumatoid arthritis, unspecified: Secondary | ICD-10-CM | POA: Diagnosis not present

## 2014-10-25 DIAGNOSIS — M353 Polymyalgia rheumatica: Secondary | ICD-10-CM | POA: Diagnosis not present

## 2014-10-25 DIAGNOSIS — M6281 Muscle weakness (generalized): Secondary | ICD-10-CM | POA: Diagnosis not present

## 2014-10-25 DIAGNOSIS — M4800 Spinal stenosis, site unspecified: Secondary | ICD-10-CM | POA: Diagnosis not present

## 2014-10-25 DIAGNOSIS — M1990 Unspecified osteoarthritis, unspecified site: Secondary | ICD-10-CM | POA: Diagnosis not present

## 2014-10-27 DIAGNOSIS — M1990 Unspecified osteoarthritis, unspecified site: Secondary | ICD-10-CM | POA: Diagnosis not present

## 2014-10-27 DIAGNOSIS — M353 Polymyalgia rheumatica: Secondary | ICD-10-CM | POA: Diagnosis not present

## 2014-10-27 DIAGNOSIS — M4800 Spinal stenosis, site unspecified: Secondary | ICD-10-CM | POA: Diagnosis not present

## 2014-10-27 DIAGNOSIS — M6281 Muscle weakness (generalized): Secondary | ICD-10-CM | POA: Diagnosis not present

## 2014-10-27 DIAGNOSIS — M069 Rheumatoid arthritis, unspecified: Secondary | ICD-10-CM | POA: Diagnosis not present

## 2014-10-30 DIAGNOSIS — M069 Rheumatoid arthritis, unspecified: Secondary | ICD-10-CM | POA: Diagnosis not present

## 2014-10-30 DIAGNOSIS — M6281 Muscle weakness (generalized): Secondary | ICD-10-CM | POA: Diagnosis not present

## 2014-10-30 DIAGNOSIS — M353 Polymyalgia rheumatica: Secondary | ICD-10-CM | POA: Diagnosis not present

## 2014-10-30 DIAGNOSIS — M1990 Unspecified osteoarthritis, unspecified site: Secondary | ICD-10-CM | POA: Diagnosis not present

## 2014-10-30 DIAGNOSIS — M4800 Spinal stenosis, site unspecified: Secondary | ICD-10-CM | POA: Diagnosis not present

## 2014-10-31 ENCOUNTER — Emergency Department (HOSPITAL_COMMUNITY): Payer: Medicare Other

## 2014-10-31 ENCOUNTER — Encounter (HOSPITAL_COMMUNITY): Payer: Self-pay | Admitting: Emergency Medicine

## 2014-10-31 ENCOUNTER — Encounter (HOSPITAL_COMMUNITY): Payer: Self-pay | Admitting: Cardiology

## 2014-10-31 ENCOUNTER — Emergency Department (HOSPITAL_COMMUNITY)
Admission: EM | Admit: 2014-10-31 | Discharge: 2014-10-31 | Disposition: A | Discharge status: Home / Self Care | Payer: Medicare Other | Attending: Emergency Medicine | Admitting: Emergency Medicine

## 2014-10-31 ENCOUNTER — Emergency Department (HOSPITAL_COMMUNITY)
Admission: EM | Admit: 2014-10-31 | Discharge: 2014-10-31 | Disposition: A | Discharge status: Home / Self Care | Payer: Medicare Other | Source: Home / Self Care

## 2014-10-31 DIAGNOSIS — R269 Unspecified abnormalities of gait and mobility: Secondary | ICD-10-CM | POA: Diagnosis not present

## 2014-10-31 DIAGNOSIS — Z880 Allergy status to penicillin: Secondary | ICD-10-CM | POA: Insufficient documentation

## 2014-10-31 DIAGNOSIS — Z79899 Other long term (current) drug therapy: Secondary | ICD-10-CM | POA: Insufficient documentation

## 2014-10-31 DIAGNOSIS — E039 Hypothyroidism, unspecified: Secondary | ICD-10-CM | POA: Diagnosis not present

## 2014-10-31 DIAGNOSIS — M5136 Other intervertebral disc degeneration, lumbar region: Secondary | ICD-10-CM | POA: Diagnosis not present

## 2014-10-31 DIAGNOSIS — R29898 Other symptoms and signs involving the musculoskeletal system: Secondary | ICD-10-CM

## 2014-10-31 DIAGNOSIS — M79651 Pain in right thigh: Secondary | ICD-10-CM | POA: Diagnosis not present

## 2014-10-31 DIAGNOSIS — Z7982 Long term (current) use of aspirin: Secondary | ICD-10-CM | POA: Diagnosis not present

## 2014-10-31 DIAGNOSIS — Z8673 Personal history of transient ischemic attack (TIA), and cerebral infarction without residual deficits: Secondary | ICD-10-CM | POA: Insufficient documentation

## 2014-10-31 DIAGNOSIS — M79604 Pain in right leg: Secondary | ICD-10-CM | POA: Diagnosis not present

## 2014-10-31 DIAGNOSIS — I509 Heart failure, unspecified: Secondary | ICD-10-CM | POA: Diagnosis not present

## 2014-10-31 DIAGNOSIS — Z905 Acquired absence of kidney: Secondary | ICD-10-CM | POA: Insufficient documentation

## 2014-10-31 DIAGNOSIS — M6281 Muscle weakness (generalized): Secondary | ICD-10-CM | POA: Diagnosis not present

## 2014-10-31 DIAGNOSIS — G8929 Other chronic pain: Secondary | ICD-10-CM | POA: Diagnosis not present

## 2014-10-31 DIAGNOSIS — M5416 Radiculopathy, lumbar region: Secondary | ICD-10-CM

## 2014-10-31 DIAGNOSIS — M859 Disorder of bone density and structure, unspecified: Secondary | ICD-10-CM | POA: Diagnosis not present

## 2014-10-31 DIAGNOSIS — E780 Pure hypercholesterolemia: Secondary | ICD-10-CM | POA: Diagnosis not present

## 2014-10-31 DIAGNOSIS — R531 Weakness: Secondary | ICD-10-CM | POA: Insufficient documentation

## 2014-10-31 DIAGNOSIS — I252 Old myocardial infarction: Secondary | ICD-10-CM | POA: Insufficient documentation

## 2014-10-31 DIAGNOSIS — Z87891 Personal history of nicotine dependence: Secondary | ICD-10-CM | POA: Diagnosis not present

## 2014-10-31 DIAGNOSIS — I2510 Atherosclerotic heart disease of native coronary artery without angina pectoris: Secondary | ICD-10-CM | POA: Diagnosis not present

## 2014-10-31 DIAGNOSIS — Z7901 Long term (current) use of anticoagulants: Secondary | ICD-10-CM | POA: Diagnosis not present

## 2014-10-31 DIAGNOSIS — N183 Chronic kidney disease, stage 3 (moderate): Secondary | ICD-10-CM | POA: Insufficient documentation

## 2014-10-31 DIAGNOSIS — Z951 Presence of aortocoronary bypass graft: Secondary | ICD-10-CM | POA: Diagnosis not present

## 2014-10-31 DIAGNOSIS — G548 Other nerve root and plexus disorders: Secondary | ICD-10-CM | POA: Diagnosis not present

## 2014-10-31 DIAGNOSIS — G549 Nerve root and plexus disorder, unspecified: Secondary | ICD-10-CM | POA: Diagnosis not present

## 2014-10-31 LAB — CBC
HCT: 37.4 % — ABNORMAL LOW (ref 39.0–52.0)
Hemoglobin: 12.3 g/dL — ABNORMAL LOW (ref 13.0–17.0)
MCH: 34.1 pg — ABNORMAL HIGH (ref 26.0–34.0)
MCHC: 32.9 g/dL (ref 30.0–36.0)
MCV: 103.6 fL — ABNORMAL HIGH (ref 78.0–100.0)
PLATELETS: 187 10*3/uL (ref 150–400)
RBC: 3.61 MIL/uL — ABNORMAL LOW (ref 4.22–5.81)
RDW: 13.6 % (ref 11.5–15.5)
WBC: 8.3 10*3/uL (ref 4.0–10.5)

## 2014-10-31 LAB — BASIC METABOLIC PANEL
ANION GAP: 9 (ref 5–15)
BUN: 33 mg/dL — AB (ref 6–23)
CO2: 23 mmol/L (ref 19–32)
Calcium: 8.8 mg/dL (ref 8.4–10.5)
Chloride: 108 mEq/L (ref 96–112)
Creatinine, Ser: 1.65 mg/dL — ABNORMAL HIGH (ref 0.50–1.35)
GFR, EST AFRICAN AMERICAN: 40 mL/min — AB (ref >90)
GFR, EST NON AFRICAN AMERICAN: 34 mL/min — AB (ref >90)
Glucose, Bld: 102 mg/dL — ABNORMAL HIGH (ref 70–99)
POTASSIUM: 4.1 mmol/L (ref 3.5–5.1)
Sodium: 140 mmol/L (ref 135–145)

## 2014-10-31 MED ORDER — DEXAMETHASONE SODIUM PHOSPHATE 10 MG/ML IJ SOLN: 10.0000 mg | Freq: Once | INTRAMUSCULAR | Status: DC

## 2014-10-31 MED ORDER — METHYLPREDNISOLONE 4 MG PO KIT: PACK | ORAL | Status: DC

## 2014-10-31 MED ORDER — DEXAMETHASONE SODIUM PHOSPHATE 10 MG/ML IJ SOLN
10.0000 mg | Freq: Once | INTRAMUSCULAR | Status: DC
  Filled 2014-10-31: qty 1

## 2014-10-31 MED ORDER — GADOBENATE DIMEGLUMINE 529 MG/ML IV SOLN
9.0000 mL | Freq: Once | INTRAVENOUS | Status: AC | PRN
  Administered 2014-10-31: 9 mL via INTRAVENOUS

## 2014-10-31 NOTE — ED Provider Notes (Signed)
CSN: [id number]     Arrival date & time 10/31/14  1421 History   None    Chief Complaint  Patient presents with  . Leg Pain   (Consider location/radiation/quality/duration/timing/severity/associated sxs/prior Treatment) Patient is a 80 y.o. male presenting with leg pain. The history is provided by the patient.  Leg Pain Location:  Leg Injury: no   Leg location:  R upper leg and R lower leg Pain details:    Quality:  Aching   Severity:  No pain   Onset quality:  Gradual   Duration:  3 weeks Chronicity:  Recurrent Dislocation: no   Associated symptoms: back pain, decreased ROM and muscle weakness   Associated symptoms: no swelling     Past Medical History  Diagnosis Date  . Chronic back pain   . Hypercholesteremia   . Hypothyroidism   . CAD (coronary artery disease)   . Chronic kidney disease (CKD), stage III (moderate)   . Polymyalgia rheumatica   . Left bundle branch block   . Hx of CABG   . History of nephrectomy   . Internal carotid artery stenosis   . Myocardial infarction   . CHF (congestive heart failure)   . Stroke    Past Surgical History  Procedure Laterality Date  . Coronary artery bypass graft    . Cholecystectomy    . Kidney surgery      kidney removed   Family History  Problem Relation Age of Onset  . Other Mother     [place] Flu  . Heart disease Father    History  Substance Use Topics  . Smoking status: Former Smoker    Types: Cigarettes    Quit date: 11/10/1970  . Smokeless tobacco: Never Used  . Alcohol Use: No    Review of Systems  Constitutional: Negative.   Musculoskeletal: Positive for myalgias, back pain, joint swelling and gait problem.  Skin: Negative.     Allergies  Penicillins  Home Medications   Prior to Admission medications   Medication Sig Start Date End Date Taking? Authorizing Provider  acetaminophen (TYLENOL) 500 MG tablet Take 1,000 mg by mouth every 8 (eight) hours as needed for pain.    Historical  Provider, MD  amLODipine (NORVASC) 2.5 MG tablet Take 2.5 mg by mouth daily.      Historical Provider, MD  aspirin 81 MG tablet Take 81 mg by mouth daily.    Historical Provider, MD  atorvastatin (LIPITOR) 20 MG tablet Take 20 mg by mouth daily.    Historical Provider, MD  calcium carbonate (OS-CAL - DOSED IN MG OF ELEMENTAL CALCIUM) 1250 MG tablet Take 1 tablet by mouth daily with breakfast.    Historical Provider, MD  clopidogrel (PLAVIX) 75 MG tablet Take 75 mg by mouth daily.      Historical Provider, MD  doxazosin (CARDURA) 4 MG tablet Take 4 mg by mouth at bedtime.    Historical Provider, MD  folic acid (FOLVITE) 1 MG tablet Take 1 mg by mouth daily.    Historical Provider, MD  gabapentin (NEURONTIN) 100 MG capsule Take 200 mg by mouth 2 (two) times daily.     Historical Provider, MD  levothyroxine (SYNTHROID, LEVOTHROID) 75 MCG tablet Take 75 mcg by mouth daily.    Historical Provider, MD  Methotrexate Sodium (METHOTREXATE PO) Take 4 tablets once a week on Wednesday    Historical Provider, MD  Multiple Vitamins-Minerals (MULTIVITAMINS THER. W/MINERALS) TABS Take 1 tablet by mouth daily.  Historical Provider, MD  nitroGLYCERIN (NITROSTAT) 0.4 MG SL tablet Place 0.4 mg under the tongue every 5 (five) minutes as needed for chest pain.    Historical Provider, MD  polyethylene glycol (MIRALAX / GLYCOLAX) packet Take 17 g by mouth daily as needed.     Historical Provider, MD  predniSONE (DELTASONE) 5 MG tablet Take 10 mg by mouth daily.     Historical Provider, MD  traMADol (ULTRAM) 50 MG tablet Take one tablet by mouth twice daily for pains 03/06/14   Tiffany L Reed, DO   BP 117/74 mmHg  Pulse 78  Temp(Src) 98 F (36.7 C) (Oral)  Resp 16  SpO2 94% Physical Exam  Constitutional: He is oriented to person, place, and time. He appears well-developed and well-nourished. No distress.  Musculoskeletal: He exhibits no edema.  Unable to slr right leg , unable to extend knee, sl effusion. No  pain with rom., left leg nl rom.   Neurological: He is alert and oriented to person, place, and time.  Skin: Skin is warm and dry.  Nursing note and vitals reviewed.   ED Course  Procedures (including critical care time) Labs Review Labs Reviewed - No data to display  Imaging Review No results found.   MDM   1. Weakness of right leg   sent for falling eval with known spinal stenosis and right leg weakness. Imbalance.    Billy Fischer, MD 10/31/14 [phone]

## 2014-10-31 NOTE — ED Notes (Signed)
Pt a/o x 4 on d/c in wheelchair with family. 

## 2014-10-31 NOTE — ED Notes (Signed)
Pt states that he has had right leg pain for over 3 weeks and the pain has had a affect in his gait to where he has fallen 3 times in over a week

## 2014-10-31 NOTE — ED Notes (Signed)
Pt reports that for the past week his right leg has been weak and he feels like he has been dragging it. Denies any other neuro symptoms at this time. Pt A&Ox4 at triage.

## 2014-10-31 NOTE — ED Notes (Signed)
Pt back from MRI 

## 2014-10-31 NOTE — ED Provider Notes (Signed)
CSN: [id number]     Arrival date & time 10/31/14  1606 History   First MD Initiated Contact with Patient 10/31/14 1741     Chief Complaint  Patient presents with  . Extremity Weakness     (Consider location/radiation/quality/duration/timing/severity/associated sxs/prior Treatment) Patient is a 80 y.o. male presenting with extremity weakness. The history is provided by the patient, the spouse and a caregiver (CNA).  Extremity Weakness This is a new problem. Episode onset: 3 weeks ago. The problem occurs constantly. The problem has been gradually worsening. Associated symptoms include myalgias and weakness. Pertinent negatives include no abdominal pain, chest pain, diaphoresis, fatigue, fever, headaches, nausea, rash, sore throat, visual change or vomiting. Nothing aggravates the symptoms. He has tried nothing for the symptoms. The treatment provided no relief.    Past Medical History  Diagnosis Date  . Chronic back pain   . Hypercholesteremia   . Hypothyroidism   . CAD (coronary artery disease)   . Chronic kidney disease (CKD), stage III (moderate)   . Polymyalgia rheumatica   . Left bundle branch block   . Hx of CABG   . History of nephrectomy   . Internal carotid artery stenosis   . Myocardial infarction   . CHF (congestive heart failure)   . Stroke    Past Surgical History  Procedure Laterality Date  . Coronary artery bypass graft    . Cholecystectomy    . Kidney surgery      kidney removed   Family History  Problem Relation Age of Onset  . Other Mother     [place] Flu  . Heart disease Father    History  Substance Use Topics  . Smoking status: Former Smoker    Types: Cigarettes    Quit date: 11/10/1970  . Smokeless tobacco: Never Used  . Alcohol Use: No    Review of Systems  Constitutional: Negative for fever, diaphoresis, activity change, appetite change and fatigue.  HENT: Negative for facial swelling, sore throat, tinnitus, trouble swallowing and voice  change.   Eyes: Negative for pain, redness and visual disturbance.  Respiratory: Negative for chest tightness, shortness of breath and wheezing.   Cardiovascular: Negative for chest pain, palpitations and leg swelling.  Gastrointestinal: Negative for nausea, vomiting, abdominal pain, diarrhea, constipation and abdominal distention.  Endocrine: Negative.   Genitourinary: Negative.  Negative for dysuria, decreased urine volume, scrotal swelling and testicular pain.  Musculoskeletal: Positive for myalgias, back pain, gait problem and extremity weakness.  Skin: Negative.  Negative for rash and wound.  Neurological: Positive for weakness. Negative for dizziness, tremors and headaches.  Psychiatric/Behavioral: Negative for suicidal ideas, hallucinations and self-injury. The patient is not nervous/anxious.       Allergies  Penicillins  Home Medications   Prior to Admission medications   Medication Sig Start Date End Date Taking? Authorizing Provider  acetaminophen (TYLENOL) 500 MG tablet Take 1,000 mg by mouth every 8 (eight) hours as needed for pain.   Yes Historical Provider, MD  aspirin 81 MG tablet Take 81 mg by mouth daily.   Yes Historical Provider, MD  atorvastatin (LIPITOR) 20 MG tablet Take 20 mg by mouth every morning.    Yes Historical Provider, MD  calcium carbonate (OS-CAL - DOSED IN MG OF ELEMENTAL CALCIUM) 1250 MG tablet Take 1 tablet by mouth daily with breakfast.   Yes Historical Provider, MD  clopidogrel (PLAVIX) 75 MG tablet Take 75 mg by mouth daily.     Yes Historical Provider, MD  doxazosin (  CARDURA) 4 MG tablet Take 4 mg by mouth at bedtime.   Yes Historical Provider, MD  folic acid (FOLVITE) 1 MG tablet Take 1 mg by mouth daily.   Yes Historical Provider, MD  gabapentin (NEURONTIN) 300 MG capsule Take 300 mg by mouth 3 (three) times daily.   Yes Historical Provider, MD  levothyroxine (SYNTHROID, LEVOTHROID) 75 MCG tablet Take 75 mcg by mouth daily.   Yes Historical  Provider, MD  methotrexate (RHEUMATREX) 2.5 MG tablet Take 10 mg by mouth every Wednesday.   Yes Historical Provider, MD  Multiple Vitamins-Minerals (MULTIVITAMINS THER. W/MINERALS) TABS Take 1 tablet by mouth daily.     Yes Historical Provider, MD  polyethylene glycol (MIRALAX / GLYCOLAX) packet Take 17 g by mouth daily.    Yes Historical Provider, MD  predniSONE (DELTASONE) 5 MG tablet Take 5 mg by mouth daily with breakfast.    Yes Historical Provider, MD  traMADol (ULTRAM) 50 MG tablet Take one tablet by mouth twice daily for pains Patient taking differently: Take 50 mg by mouth every 12 (twelve) hours as needed for moderate pain.  03/06/14  Yes Tiffany L Reed, DO  methylPREDNISolone (MEDROL DOSEPAK) 4 MG tablet follow package directions 10/31/14   Margaretann Loveless, MD  nitroGLYCERIN (NITROSTAT) 0.4 MG SL tablet Place 0.4 mg under the tongue every 5 (five) minutes as needed for chest pain.    Historical Provider, MD   BP 180/66 mmHg  Pulse 74  Temp(Src) 98.1 F (36.7 C) (Oral)  Resp 22  Wt 186 lb (84.369 kg)  SpO2 96% Physical Exam  Constitutional: He is oriented to person, place, and time. He appears well-developed and well-nourished. No distress.  HENT:  Head: Normocephalic and atraumatic.  Right Ear: External ear normal.  Left Ear: External ear normal.  Nose: Nose normal.  Mouth/Throat: Oropharynx is clear and moist.  Eyes: Conjunctivae and EOM are normal. Pupils are equal, round, and reactive to light. No scleral icterus.  Neck: Normal range of motion. Neck supple. No JVD present. No tracheal deviation present. No thyromegaly present.  Cardiovascular: Normal rate and intact distal pulses.  Exam reveals no gallop and no friction rub.   No murmur heard. Pulmonary/Chest: Effort normal and breath sounds normal. No stridor. No respiratory distress. He has no wheezes. He has no rales.  Abdominal: Soft. He exhibits no distension. There is no tenderness. There is no rebound and no guarding.   Musculoskeletal: He exhibits tenderness (mild tenderness to right upper thigh. No other extremity tenderness. No midline back tenderness.). He exhibits no edema.  Patient can not fully extend or flex right leg. Full passive ROM of both legs.  Neurological: He is alert and oriented to person, place, and time. No cranial nerve deficit. He exhibits normal muscle tone. Coordination normal.  4/5 strength of right leg. 5/5 strength of left leg and bilateral upper extremities.  Skin: Skin is warm and dry. No rash noted. He is not diaphoretic.  Psychiatric: He has a normal mood and affect. His behavior is normal.  Nursing note and vitals reviewed.   ED Course  Procedures (including critical care time) Labs Review Labs Reviewed  CBC - Abnormal; Notable for the following:    RBC 3.61 (*)    Hemoglobin 12.3 (*)    HCT 37.4 (*)    MCV 103.6 (*)    MCH 34.1 (*)    All other components within normal limits  BASIC METABOLIC PANEL - Abnormal; Notable for the following:    Glucose,  Bld 102 (*)    BUN 33 (*)    Creatinine, Ser 1.65 (*)    GFR calc non Af Amer 34 (*)    GFR calc Af Amer 40 (*)    All other components within normal limits    Imaging Review Dg Pelvis 1-2 Views  10/31/2014   CLINICAL DATA:  80 year old male with 3 week history of right leg pain and recent history of 3 falls over the past 3 weeks.  EXAM: PELVIS - 1-2 VIEW  COMPARISON:  Concurrently obtained radiographs of the right femur and MRI of the lumbar spine.  FINDINGS: The bones are mildly osteopenic. Atherosclerotic vascular calcifications are present in the bilateral common and superficial femoral arteries. No evidence of acute fracture or malalignment. Degenerative disc disease is present in the visualized lower lumbar spine. The bony pelvis is intact. Unremarkable bowel gas pattern. No lytic or blastic osseous lesion.  IMPRESSION: 1. No acute fracture or malalignment. 2. Atherosclerotic vascular calcifications.    Electronically Signed   By: Jacqulynn Cadet M.D.   On: 10/31/2014 21:04   Dg Femur Right  10/31/2014   CLINICAL DATA:  Fel 3 times over past 3 weeks, right leg pain  EXAM: RIGHT FEMUR - 2 VIEW  COMPARISON:  None.  FINDINGS: Four views of the right femur submitted. No acute fracture or subluxation. Atherosclerotic calcifications of femoral artery. Diffuse osteopenia.  IMPRESSION: Diffuse osteopenia.  No acute fracture or subluxation.   Electronically Signed   By: Lahoma Crocker M.D.   On: 10/31/2014 21:05   Mr Brain Wo Contrast  10/31/2014   CLINICAL DATA:  Right leg weakness and right leg pain for 3 weeks.  EXAM: MRI HEAD WITHOUT CONTRAST  TECHNIQUE: Multiplanar, multiecho pulse sequences of the brain and surrounding structures were obtained without intravenous contrast.  COMPARISON:  02/21/2014  FINDINGS: There is no evidence of acute infarct, intracranial hemorrhage, mass, midline shift, or extra-axial fluid collection. Advanced cerebral atrophy is unchanged. Patchy and confluent T2 hyperintensities in the subcortical and deep cerebral white matter do not appear significantly changed and are nonspecific but compatible with moderate chronic small vessel ischemic disease. Mild chronic ischemic changes are again seen in the pons. Chronic left basal ganglia lacunar infarcts are again seen.  Prior bilateral cataract extraction is noted. Mild bilateral maxillary sinus mucosal thickening has decreased from the prior study. No significant mastoid air cell fluid is identified. Major intracranial vascular flow voids are preserved.  IMPRESSION: 1. No acute intracranial abnormality. 2. Unchanged chronic small vessel ischemic disease and cerebral atrophy.   Electronically Signed   By: Logan Bores   On: 10/31/2014 20:37   Mr Lumbar Spine W Wo Contrast  10/31/2014   CLINICAL DATA:  Initial evaluation for low back pain and weakness in right leg.  EXAM: MRI LUMBAR SPINE WITHOUT AND WITH CONTRAST  TECHNIQUE: Multiplanar  and multiecho pulse sequences of the lumbar spine were obtained without and with intravenous contrast.  CONTRAST:  90 cc of MultiHance.  COMPARISON:  Prior study from 08/11/2011  FINDINGS: For the purposes of this dictation, the lowest well-formed intervertebral disc spaces presumed to be the L5-S1 level, and there presumed to be 5 lumbar type vertebral bodies.  Levoscoliosis of the lumbar spine again noted. Vertebral body heights maintained. No fracture. There is patchy heterogeneous bone marrow signal intensity throughout the visualized bone marrow, likely related to underlying osteoporosis. No worrisome focal osseous lesion.  Conus medullaris terminates normally at the L1 level. Signal intensity within  the visualized cord is unremarkable. Nerve roots of the cauda equina are unremarkable.  No abnormal enhancement on post-contrast sequences.  Fatty atrophy noted within the paraspinous musculature. Extra renal pelvis noted within the right kidney. No retroperitoneal adenopathy. No aneurysm within the visualized aorta.  T11-12: Mild disc desiccation without disc bulge or disc protrusion. No significant stenosis.  T12-L1: Disc desiccation without disc bulge or disc protrusion. Canal and neural foramina remain widely patent.  L1-2: Diffuse disc bulge with disc desiccation. There is a superimposed right foraminal disc protrusion (series 7, image 12). No associated nerve root impingement. Mild left foraminal stenosis identified. Bilateral facet arthrosis with ligamentum flavum hypertrophy. There is mild canal narrowing. Left neural foramen remains widely patent.  L2-3: Degenerative intervertebral disc space narrowing with disc bulging. T2 hyperintensities seen within the L2-3 intervertebral disc favored to be related to degenerative changes. No associated endplate changes or abnormal enhancement to suggest osteomyelitis discitis. There is a right foraminal disc protrusion extending into the right L2-3 neural foramen.  There is associated approximately 19 mm of inferior migration with encroachment upon the right lateral recess. The migrated portion of the disc may be sequestered. There is associated impingement of the transiting right L3 nerve root (series 6, image 22). There is superimposed moderate facet arthrosis with mild ligamentum flavum hypertrophy. There is resultant moderate canal and right lateral recess stenosis. Left neural foramina remains widely patent.  L3-4: Diffuse disc bulge with disc desiccation. There is a superimposed shallow left far lateral disc protrusion without associated impingement. Moderate bilateral facet arthrosis with ligamentum flavum hypertrophy. There is resultant fairly severe canal and lateral recess there is moderate right with mild left foraminal stenosis. Stenosis with the thecal sac measuring 9 mm in AP diameter.  L4-5: Diffuse disc bulge with disc desiccation. Moderate bilateral facet arthrosis with ligamentum flavum hypertrophy. There is resultant severe canal and lateral recess stenosis with the thecal sac measuring 6.6 mm in AP diameter. No focal disc herniation. There is mild left foraminal narrowing.  L5-S1: Diffuse disc bulge with disc desiccation. No focal disc herniation. No significant facet arthrosis. No canal or foraminal stenosis.  IMPRESSION: 1. Right foraminal disc protrusion at L2-3 with associated inferior migration and impingement upon the transiting right L3 nerve root. This is new relative to most recent MRI from 08/11/2011. 2. Disc bulge with facet arthropathy at L3-4 and L4-5 with resultant severe canal and lateral recess stenosis bilaterally. Findings are more severe at L4-5. 3. Levoscoliosis with associated additional multilevel degenerative changes as above. Results were called by telephone at the time of interpretation on 10/31/2014 at 10:10 pm to Dr. Sherwood Gambler , who verbally acknowledged these results.   Electronically Signed   By: Jeannine Boga M.D.    On: 10/31/2014 22:13     EKG Interpretation None      MDM   Final diagnoses:  Weakness of right leg  Lumbar nerve root impingement    The patient is a 80 y.o. M with spinal stenosis who presents with 3 week of right leg weakness that has caused multiple falls, most recently 1 week ago. No LOC. Exam as above. Plain films and labs unremarkable. MRI shows impingement of the L3 nerve root. Neurosurgery is consulted and the case is discussed with Dr. Ellene Route. Recommends steroids and Neurosurgery clinic follow up. Decadron is ordered in ED and patient originally agrees to take it but after speaking with his daughter over the telephone patient changes his mind and refuses the drug after  stating his daughter the medicine was "too risky". The patient states he is agreeable taking a medrol dose pack taper though. The risks and benefits of taking these medications is discussed with the patient and it is reccommended he take the decadron in the ED but he continues to decline it. Patient is discharged with Medrol dose pack and follow up with neurosurgery as mentioned above as well as standard ED return precautions.  Patient seen with attending, Dr. Regenia Skeeter, who oversaw clinical decision making.   Margaretann Loveless, MD 11/01/14 0017  Ephraim Hamburger, MD 11/08/14 [phone]

## 2014-10-31 NOTE — ED Notes (Signed)
Pt will be transferred to [facility] via shuttle. Report given to [facility] nurse.

## 2014-11-02 DIAGNOSIS — M069 Rheumatoid arthritis, unspecified: Secondary | ICD-10-CM | POA: Diagnosis not present

## 2014-11-02 DIAGNOSIS — M1990 Unspecified osteoarthritis, unspecified site: Secondary | ICD-10-CM | POA: Diagnosis not present

## 2014-11-02 DIAGNOSIS — M6281 Muscle weakness (generalized): Secondary | ICD-10-CM | POA: Diagnosis not present

## 2014-11-02 DIAGNOSIS — M4800 Spinal stenosis, site unspecified: Secondary | ICD-10-CM | POA: Diagnosis not present

## 2014-11-02 DIAGNOSIS — M353 Polymyalgia rheumatica: Secondary | ICD-10-CM | POA: Diagnosis not present

## 2014-11-06 DIAGNOSIS — M1990 Unspecified osteoarthritis, unspecified site: Secondary | ICD-10-CM | POA: Diagnosis not present

## 2014-11-06 DIAGNOSIS — M353 Polymyalgia rheumatica: Secondary | ICD-10-CM | POA: Diagnosis not present

## 2014-11-06 DIAGNOSIS — M6281 Muscle weakness (generalized): Secondary | ICD-10-CM | POA: Diagnosis not present

## 2014-11-06 DIAGNOSIS — M4800 Spinal stenosis, site unspecified: Secondary | ICD-10-CM | POA: Diagnosis not present

## 2014-11-06 DIAGNOSIS — M069 Rheumatoid arthritis, unspecified: Secondary | ICD-10-CM | POA: Diagnosis not present

## 2014-11-08 DIAGNOSIS — M1990 Unspecified osteoarthritis, unspecified site: Secondary | ICD-10-CM | POA: Diagnosis not present

## 2014-11-08 DIAGNOSIS — M353 Polymyalgia rheumatica: Secondary | ICD-10-CM | POA: Diagnosis not present

## 2014-11-08 DIAGNOSIS — M4800 Spinal stenosis, site unspecified: Secondary | ICD-10-CM | POA: Diagnosis not present

## 2014-11-08 DIAGNOSIS — M6281 Muscle weakness (generalized): Secondary | ICD-10-CM | POA: Diagnosis not present

## 2014-11-08 DIAGNOSIS — M069 Rheumatoid arthritis, unspecified: Secondary | ICD-10-CM | POA: Diagnosis not present

## 2014-11-13 DIAGNOSIS — M069 Rheumatoid arthritis, unspecified: Secondary | ICD-10-CM | POA: Diagnosis not present

## 2014-11-13 DIAGNOSIS — M6281 Muscle weakness (generalized): Secondary | ICD-10-CM | POA: Diagnosis not present

## 2014-11-13 DIAGNOSIS — M4800 Spinal stenosis, site unspecified: Secondary | ICD-10-CM | POA: Diagnosis not present

## 2014-11-13 DIAGNOSIS — M1990 Unspecified osteoarthritis, unspecified site: Secondary | ICD-10-CM | POA: Diagnosis not present

## 2014-11-13 DIAGNOSIS — M353 Polymyalgia rheumatica: Secondary | ICD-10-CM | POA: Diagnosis not present

## 2014-11-15 DIAGNOSIS — M4800 Spinal stenosis, site unspecified: Secondary | ICD-10-CM | POA: Diagnosis not present

## 2014-11-15 DIAGNOSIS — M1990 Unspecified osteoarthritis, unspecified site: Secondary | ICD-10-CM | POA: Diagnosis not present

## 2014-11-15 DIAGNOSIS — M069 Rheumatoid arthritis, unspecified: Secondary | ICD-10-CM | POA: Diagnosis not present

## 2014-11-15 DIAGNOSIS — M353 Polymyalgia rheumatica: Secondary | ICD-10-CM | POA: Diagnosis not present

## 2014-11-15 DIAGNOSIS — M6281 Muscle weakness (generalized): Secondary | ICD-10-CM | POA: Diagnosis not present

## 2014-11-17 DIAGNOSIS — M353 Polymyalgia rheumatica: Secondary | ICD-10-CM | POA: Diagnosis not present

## 2014-11-17 DIAGNOSIS — M4800 Spinal stenosis, site unspecified: Secondary | ICD-10-CM | POA: Diagnosis not present

## 2014-11-17 DIAGNOSIS — M069 Rheumatoid arthritis, unspecified: Secondary | ICD-10-CM | POA: Diagnosis not present

## 2014-11-17 DIAGNOSIS — M6281 Muscle weakness (generalized): Secondary | ICD-10-CM | POA: Diagnosis not present

## 2014-11-17 DIAGNOSIS — M1990 Unspecified osteoarthritis, unspecified site: Secondary | ICD-10-CM | POA: Diagnosis not present

## 2014-11-20 DIAGNOSIS — M1990 Unspecified osteoarthritis, unspecified site: Secondary | ICD-10-CM | POA: Diagnosis not present

## 2014-11-20 DIAGNOSIS — M069 Rheumatoid arthritis, unspecified: Secondary | ICD-10-CM | POA: Diagnosis not present

## 2014-11-20 DIAGNOSIS — M6281 Muscle weakness (generalized): Secondary | ICD-10-CM | POA: Diagnosis not present

## 2014-11-20 DIAGNOSIS — M4800 Spinal stenosis, site unspecified: Secondary | ICD-10-CM | POA: Diagnosis not present

## 2014-11-20 DIAGNOSIS — M353 Polymyalgia rheumatica: Secondary | ICD-10-CM | POA: Diagnosis not present

## 2014-11-22 DIAGNOSIS — M069 Rheumatoid arthritis, unspecified: Secondary | ICD-10-CM | POA: Diagnosis not present

## 2014-11-22 DIAGNOSIS — M4800 Spinal stenosis, site unspecified: Secondary | ICD-10-CM | POA: Diagnosis not present

## 2014-11-22 DIAGNOSIS — M6281 Muscle weakness (generalized): Secondary | ICD-10-CM | POA: Diagnosis not present

## 2014-11-22 DIAGNOSIS — M1990 Unspecified osteoarthritis, unspecified site: Secondary | ICD-10-CM | POA: Diagnosis not present

## 2014-11-22 DIAGNOSIS — M353 Polymyalgia rheumatica: Secondary | ICD-10-CM | POA: Diagnosis not present

## 2014-11-24 DIAGNOSIS — M353 Polymyalgia rheumatica: Secondary | ICD-10-CM | POA: Diagnosis not present

## 2014-11-24 DIAGNOSIS — M069 Rheumatoid arthritis, unspecified: Secondary | ICD-10-CM | POA: Diagnosis not present

## 2014-11-24 DIAGNOSIS — M6281 Muscle weakness (generalized): Secondary | ICD-10-CM | POA: Diagnosis not present

## 2014-11-24 DIAGNOSIS — M1990 Unspecified osteoarthritis, unspecified site: Secondary | ICD-10-CM | POA: Diagnosis not present

## 2014-11-24 DIAGNOSIS — M4800 Spinal stenosis, site unspecified: Secondary | ICD-10-CM | POA: Diagnosis not present

## 2014-11-27 DIAGNOSIS — M069 Rheumatoid arthritis, unspecified: Secondary | ICD-10-CM | POA: Diagnosis not present

## 2014-11-27 DIAGNOSIS — M353 Polymyalgia rheumatica: Secondary | ICD-10-CM | POA: Diagnosis not present

## 2014-11-27 DIAGNOSIS — M1990 Unspecified osteoarthritis, unspecified site: Secondary | ICD-10-CM | POA: Diagnosis not present

## 2014-11-27 DIAGNOSIS — M6281 Muscle weakness (generalized): Secondary | ICD-10-CM | POA: Diagnosis not present

## 2014-11-27 DIAGNOSIS — M4800 Spinal stenosis, site unspecified: Secondary | ICD-10-CM | POA: Diagnosis not present

## 2014-11-28 DIAGNOSIS — M25559 Pain in unspecified hip: Secondary | ICD-10-CM | POA: Diagnosis not present

## 2014-11-28 DIAGNOSIS — M1611 Unilateral primary osteoarthritis, right hip: Secondary | ICD-10-CM | POA: Diagnosis not present

## 2014-11-29 DIAGNOSIS — M4800 Spinal stenosis, site unspecified: Secondary | ICD-10-CM | POA: Diagnosis not present

## 2014-11-29 DIAGNOSIS — M1990 Unspecified osteoarthritis, unspecified site: Secondary | ICD-10-CM | POA: Diagnosis not present

## 2014-11-29 DIAGNOSIS — M6281 Muscle weakness (generalized): Secondary | ICD-10-CM | POA: Diagnosis not present

## 2014-11-29 DIAGNOSIS — M353 Polymyalgia rheumatica: Secondary | ICD-10-CM | POA: Diagnosis not present

## 2014-11-29 DIAGNOSIS — M069 Rheumatoid arthritis, unspecified: Secondary | ICD-10-CM | POA: Diagnosis not present

## 2014-12-04 DIAGNOSIS — M6281 Muscle weakness (generalized): Secondary | ICD-10-CM | POA: Diagnosis not present

## 2014-12-04 DIAGNOSIS — M4800 Spinal stenosis, site unspecified: Secondary | ICD-10-CM | POA: Diagnosis not present

## 2014-12-04 DIAGNOSIS — M069 Rheumatoid arthritis, unspecified: Secondary | ICD-10-CM | POA: Diagnosis not present

## 2014-12-04 DIAGNOSIS — M353 Polymyalgia rheumatica: Secondary | ICD-10-CM | POA: Diagnosis not present

## 2014-12-04 DIAGNOSIS — M1990 Unspecified osteoarthritis, unspecified site: Secondary | ICD-10-CM | POA: Diagnosis not present

## 2014-12-05 DIAGNOSIS — M1990 Unspecified osteoarthritis, unspecified site: Secondary | ICD-10-CM | POA: Diagnosis not present

## 2014-12-05 DIAGNOSIS — M4800 Spinal stenosis, site unspecified: Secondary | ICD-10-CM | POA: Diagnosis not present

## 2014-12-05 DIAGNOSIS — M069 Rheumatoid arthritis, unspecified: Secondary | ICD-10-CM | POA: Diagnosis not present

## 2014-12-05 DIAGNOSIS — M6281 Muscle weakness (generalized): Secondary | ICD-10-CM | POA: Diagnosis not present

## 2014-12-05 DIAGNOSIS — M353 Polymyalgia rheumatica: Secondary | ICD-10-CM | POA: Diagnosis not present

## 2014-12-06 DIAGNOSIS — M6281 Muscle weakness (generalized): Secondary | ICD-10-CM | POA: Diagnosis not present

## 2014-12-06 DIAGNOSIS — R262 Difficulty in walking, not elsewhere classified: Secondary | ICD-10-CM | POA: Diagnosis not present

## 2014-12-06 DIAGNOSIS — R609 Edema, unspecified: Secondary | ICD-10-CM | POA: Diagnosis not present

## 2014-12-06 DIAGNOSIS — N183 Chronic kidney disease, stage 3 (moderate): Secondary | ICD-10-CM | POA: Diagnosis not present

## 2014-12-06 DIAGNOSIS — M353 Polymyalgia rheumatica: Secondary | ICD-10-CM | POA: Diagnosis not present

## 2014-12-06 DIAGNOSIS — R269 Unspecified abnormalities of gait and mobility: Secondary | ICD-10-CM | POA: Diagnosis not present

## 2014-12-06 DIAGNOSIS — M069 Rheumatoid arthritis, unspecified: Secondary | ICD-10-CM | POA: Diagnosis not present

## 2014-12-06 DIAGNOSIS — I129 Hypertensive chronic kidney disease with stage 1 through stage 4 chronic kidney disease, or unspecified chronic kidney disease: Secondary | ICD-10-CM | POA: Diagnosis not present

## 2014-12-06 DIAGNOSIS — Z79899 Other long term (current) drug therapy: Secondary | ICD-10-CM | POA: Diagnosis not present

## 2014-12-06 DIAGNOSIS — Q253 Supravalvular aortic stenosis: Secondary | ICD-10-CM | POA: Diagnosis not present

## 2014-12-06 DIAGNOSIS — M1990 Unspecified osteoarthritis, unspecified site: Secondary | ICD-10-CM | POA: Diagnosis not present

## 2014-12-06 DIAGNOSIS — M4800 Spinal stenosis, site unspecified: Secondary | ICD-10-CM | POA: Diagnosis not present

## 2014-12-07 DIAGNOSIS — M353 Polymyalgia rheumatica: Secondary | ICD-10-CM | POA: Diagnosis not present

## 2014-12-07 DIAGNOSIS — M6281 Muscle weakness (generalized): Secondary | ICD-10-CM | POA: Diagnosis not present

## 2014-12-07 DIAGNOSIS — M4800 Spinal stenosis, site unspecified: Secondary | ICD-10-CM | POA: Diagnosis not present

## 2014-12-07 DIAGNOSIS — R262 Difficulty in walking, not elsewhere classified: Secondary | ICD-10-CM | POA: Diagnosis not present

## 2014-12-07 DIAGNOSIS — M069 Rheumatoid arthritis, unspecified: Secondary | ICD-10-CM | POA: Diagnosis not present

## 2014-12-07 DIAGNOSIS — M1990 Unspecified osteoarthritis, unspecified site: Secondary | ICD-10-CM | POA: Diagnosis not present

## 2014-12-08 DIAGNOSIS — R262 Difficulty in walking, not elsewhere classified: Secondary | ICD-10-CM | POA: Diagnosis not present

## 2014-12-08 DIAGNOSIS — M353 Polymyalgia rheumatica: Secondary | ICD-10-CM | POA: Diagnosis not present

## 2014-12-08 DIAGNOSIS — M1990 Unspecified osteoarthritis, unspecified site: Secondary | ICD-10-CM | POA: Diagnosis not present

## 2014-12-08 DIAGNOSIS — M069 Rheumatoid arthritis, unspecified: Secondary | ICD-10-CM | POA: Diagnosis not present

## 2014-12-08 DIAGNOSIS — M4800 Spinal stenosis, site unspecified: Secondary | ICD-10-CM | POA: Diagnosis not present

## 2014-12-08 DIAGNOSIS — M6281 Muscle weakness (generalized): Secondary | ICD-10-CM | POA: Diagnosis not present

## 2014-12-11 DIAGNOSIS — M79609 Pain in unspecified limb: Secondary | ICD-10-CM | POA: Diagnosis not present

## 2014-12-11 DIAGNOSIS — B351 Tinea unguium: Secondary | ICD-10-CM | POA: Diagnosis not present

## 2014-12-12 DIAGNOSIS — M353 Polymyalgia rheumatica: Secondary | ICD-10-CM | POA: Diagnosis not present

## 2014-12-12 DIAGNOSIS — M6281 Muscle weakness (generalized): Secondary | ICD-10-CM | POA: Diagnosis not present

## 2014-12-12 DIAGNOSIS — R262 Difficulty in walking, not elsewhere classified: Secondary | ICD-10-CM | POA: Diagnosis not present

## 2014-12-12 DIAGNOSIS — M4800 Spinal stenosis, site unspecified: Secondary | ICD-10-CM | POA: Diagnosis not present

## 2014-12-12 DIAGNOSIS — M1990 Unspecified osteoarthritis, unspecified site: Secondary | ICD-10-CM | POA: Diagnosis not present

## 2014-12-12 DIAGNOSIS — M069 Rheumatoid arthritis, unspecified: Secondary | ICD-10-CM | POA: Diagnosis not present

## 2014-12-13 DIAGNOSIS — R262 Difficulty in walking, not elsewhere classified: Secondary | ICD-10-CM | POA: Diagnosis not present

## 2014-12-13 DIAGNOSIS — M353 Polymyalgia rheumatica: Secondary | ICD-10-CM | POA: Diagnosis not present

## 2014-12-13 DIAGNOSIS — M069 Rheumatoid arthritis, unspecified: Secondary | ICD-10-CM | POA: Diagnosis not present

## 2014-12-13 DIAGNOSIS — M4800 Spinal stenosis, site unspecified: Secondary | ICD-10-CM | POA: Diagnosis not present

## 2014-12-13 DIAGNOSIS — M1990 Unspecified osteoarthritis, unspecified site: Secondary | ICD-10-CM | POA: Diagnosis not present

## 2014-12-13 DIAGNOSIS — M6281 Muscle weakness (generalized): Secondary | ICD-10-CM | POA: Diagnosis not present

## 2014-12-14 DIAGNOSIS — M6281 Muscle weakness (generalized): Secondary | ICD-10-CM | POA: Diagnosis not present

## 2014-12-14 DIAGNOSIS — M1990 Unspecified osteoarthritis, unspecified site: Secondary | ICD-10-CM | POA: Diagnosis not present

## 2014-12-14 DIAGNOSIS — M069 Rheumatoid arthritis, unspecified: Secondary | ICD-10-CM | POA: Diagnosis not present

## 2014-12-14 DIAGNOSIS — R262 Difficulty in walking, not elsewhere classified: Secondary | ICD-10-CM | POA: Diagnosis not present

## 2014-12-14 DIAGNOSIS — M4800 Spinal stenosis, site unspecified: Secondary | ICD-10-CM | POA: Diagnosis not present

## 2014-12-14 DIAGNOSIS — M353 Polymyalgia rheumatica: Secondary | ICD-10-CM | POA: Diagnosis not present

## 2014-12-18 DIAGNOSIS — M6281 Muscle weakness (generalized): Secondary | ICD-10-CM | POA: Diagnosis not present

## 2014-12-18 DIAGNOSIS — R262 Difficulty in walking, not elsewhere classified: Secondary | ICD-10-CM | POA: Diagnosis not present

## 2014-12-18 DIAGNOSIS — M1990 Unspecified osteoarthritis, unspecified site: Secondary | ICD-10-CM | POA: Diagnosis not present

## 2014-12-18 DIAGNOSIS — M353 Polymyalgia rheumatica: Secondary | ICD-10-CM | POA: Diagnosis not present

## 2014-12-18 DIAGNOSIS — M4800 Spinal stenosis, site unspecified: Secondary | ICD-10-CM | POA: Diagnosis not present

## 2014-12-18 DIAGNOSIS — M069 Rheumatoid arthritis, unspecified: Secondary | ICD-10-CM | POA: Diagnosis not present

## 2014-12-19 DIAGNOSIS — M353 Polymyalgia rheumatica: Secondary | ICD-10-CM | POA: Diagnosis not present

## 2014-12-19 DIAGNOSIS — M1990 Unspecified osteoarthritis, unspecified site: Secondary | ICD-10-CM | POA: Diagnosis not present

## 2014-12-19 DIAGNOSIS — M6281 Muscle weakness (generalized): Secondary | ICD-10-CM | POA: Diagnosis not present

## 2014-12-19 DIAGNOSIS — M4800 Spinal stenosis, site unspecified: Secondary | ICD-10-CM | POA: Diagnosis not present

## 2014-12-19 DIAGNOSIS — R262 Difficulty in walking, not elsewhere classified: Secondary | ICD-10-CM | POA: Diagnosis not present

## 2014-12-19 DIAGNOSIS — M069 Rheumatoid arthritis, unspecified: Secondary | ICD-10-CM | POA: Diagnosis not present

## 2014-12-20 DIAGNOSIS — M6281 Muscle weakness (generalized): Secondary | ICD-10-CM | POA: Diagnosis not present

## 2014-12-20 DIAGNOSIS — R262 Difficulty in walking, not elsewhere classified: Secondary | ICD-10-CM | POA: Diagnosis not present

## 2014-12-20 DIAGNOSIS — M1990 Unspecified osteoarthritis, unspecified site: Secondary | ICD-10-CM | POA: Diagnosis not present

## 2014-12-20 DIAGNOSIS — M4800 Spinal stenosis, site unspecified: Secondary | ICD-10-CM | POA: Diagnosis not present

## 2014-12-20 DIAGNOSIS — M069 Rheumatoid arthritis, unspecified: Secondary | ICD-10-CM | POA: Diagnosis not present

## 2014-12-20 DIAGNOSIS — M353 Polymyalgia rheumatica: Secondary | ICD-10-CM | POA: Diagnosis not present

## 2014-12-21 DIAGNOSIS — M4800 Spinal stenosis, site unspecified: Secondary | ICD-10-CM | POA: Diagnosis not present

## 2014-12-21 DIAGNOSIS — M069 Rheumatoid arthritis, unspecified: Secondary | ICD-10-CM | POA: Diagnosis not present

## 2014-12-21 DIAGNOSIS — R262 Difficulty in walking, not elsewhere classified: Secondary | ICD-10-CM | POA: Diagnosis not present

## 2014-12-21 DIAGNOSIS — M353 Polymyalgia rheumatica: Secondary | ICD-10-CM | POA: Diagnosis not present

## 2014-12-21 DIAGNOSIS — M1990 Unspecified osteoarthritis, unspecified site: Secondary | ICD-10-CM | POA: Diagnosis not present

## 2014-12-21 DIAGNOSIS — M6281 Muscle weakness (generalized): Secondary | ICD-10-CM | POA: Diagnosis not present

## 2014-12-22 DIAGNOSIS — R262 Difficulty in walking, not elsewhere classified: Secondary | ICD-10-CM | POA: Diagnosis not present

## 2014-12-22 DIAGNOSIS — M4800 Spinal stenosis, site unspecified: Secondary | ICD-10-CM | POA: Diagnosis not present

## 2014-12-22 DIAGNOSIS — M069 Rheumatoid arthritis, unspecified: Secondary | ICD-10-CM | POA: Diagnosis not present

## 2014-12-22 DIAGNOSIS — M1990 Unspecified osteoarthritis, unspecified site: Secondary | ICD-10-CM | POA: Diagnosis not present

## 2014-12-22 DIAGNOSIS — M353 Polymyalgia rheumatica: Secondary | ICD-10-CM | POA: Diagnosis not present

## 2014-12-22 DIAGNOSIS — M6281 Muscle weakness (generalized): Secondary | ICD-10-CM | POA: Diagnosis not present

## 2014-12-25 DIAGNOSIS — M4800 Spinal stenosis, site unspecified: Secondary | ICD-10-CM | POA: Diagnosis not present

## 2014-12-25 DIAGNOSIS — M353 Polymyalgia rheumatica: Secondary | ICD-10-CM | POA: Diagnosis not present

## 2014-12-25 DIAGNOSIS — M1990 Unspecified osteoarthritis, unspecified site: Secondary | ICD-10-CM | POA: Diagnosis not present

## 2014-12-25 DIAGNOSIS — R262 Difficulty in walking, not elsewhere classified: Secondary | ICD-10-CM | POA: Diagnosis not present

## 2014-12-25 DIAGNOSIS — M6281 Muscle weakness (generalized): Secondary | ICD-10-CM | POA: Diagnosis not present

## 2014-12-25 DIAGNOSIS — M069 Rheumatoid arthritis, unspecified: Secondary | ICD-10-CM | POA: Diagnosis not present

## 2014-12-26 ENCOUNTER — Other Ambulatory Visit: Payer: Self-pay | Admitting: Geriatric Medicine

## 2014-12-26 ENCOUNTER — Ambulatory Visit
Admission: RE | Admit: 2014-12-26 | Discharge: 2014-12-26 | Disposition: A | Discharge status: Home / Self Care | Payer: Medicare Other | Source: Ambulatory Visit | Attending: Geriatric Medicine | Admitting: Geriatric Medicine

## 2014-12-26 DIAGNOSIS — M069 Rheumatoid arthritis, unspecified: Secondary | ICD-10-CM | POA: Diagnosis not present

## 2014-12-26 DIAGNOSIS — G4733 Obstructive sleep apnea (adult) (pediatric): Secondary | ICD-10-CM | POA: Diagnosis not present

## 2014-12-26 DIAGNOSIS — Z23 Encounter for immunization: Secondary | ICD-10-CM | POA: Diagnosis not present

## 2014-12-26 DIAGNOSIS — D692 Other nonthrombocytopenic purpura: Secondary | ICD-10-CM | POA: Diagnosis not present

## 2014-12-26 DIAGNOSIS — R269 Unspecified abnormalities of gait and mobility: Secondary | ICD-10-CM | POA: Diagnosis not present

## 2014-12-26 DIAGNOSIS — R0989 Other specified symptoms and signs involving the circulatory and respiratory systems: Secondary | ICD-10-CM

## 2014-12-26 DIAGNOSIS — I129 Hypertensive chronic kidney disease with stage 1 through stage 4 chronic kidney disease, or unspecified chronic kidney disease: Secondary | ICD-10-CM | POA: Diagnosis not present

## 2014-12-26 DIAGNOSIS — Z1389 Encounter for screening for other disorder: Secondary | ICD-10-CM | POA: Diagnosis not present

## 2014-12-26 DIAGNOSIS — Z0000 Encounter for general adult medical examination without abnormal findings: Secondary | ICD-10-CM | POA: Diagnosis not present

## 2014-12-26 DIAGNOSIS — N183 Chronic kidney disease, stage 3 (moderate): Secondary | ICD-10-CM | POA: Diagnosis not present

## 2014-12-26 DIAGNOSIS — E780 Pure hypercholesterolemia: Secondary | ICD-10-CM | POA: Diagnosis not present

## 2014-12-26 DIAGNOSIS — Q253 Supravalvular aortic stenosis: Secondary | ICD-10-CM | POA: Diagnosis not present

## 2014-12-26 DIAGNOSIS — E039 Hypothyroidism, unspecified: Secondary | ICD-10-CM | POA: Diagnosis not present

## 2014-12-27 DIAGNOSIS — M6281 Muscle weakness (generalized): Secondary | ICD-10-CM | POA: Diagnosis not present

## 2014-12-27 DIAGNOSIS — M353 Polymyalgia rheumatica: Secondary | ICD-10-CM | POA: Diagnosis not present

## 2014-12-27 DIAGNOSIS — M1990 Unspecified osteoarthritis, unspecified site: Secondary | ICD-10-CM | POA: Diagnosis not present

## 2014-12-27 DIAGNOSIS — R262 Difficulty in walking, not elsewhere classified: Secondary | ICD-10-CM | POA: Diagnosis not present

## 2014-12-27 DIAGNOSIS — M069 Rheumatoid arthritis, unspecified: Secondary | ICD-10-CM | POA: Diagnosis not present

## 2014-12-27 DIAGNOSIS — M4800 Spinal stenosis, site unspecified: Secondary | ICD-10-CM | POA: Diagnosis not present

## 2014-12-28 DIAGNOSIS — M4800 Spinal stenosis, site unspecified: Secondary | ICD-10-CM | POA: Diagnosis not present

## 2014-12-28 DIAGNOSIS — M353 Polymyalgia rheumatica: Secondary | ICD-10-CM | POA: Diagnosis not present

## 2014-12-28 DIAGNOSIS — R262 Difficulty in walking, not elsewhere classified: Secondary | ICD-10-CM | POA: Diagnosis not present

## 2014-12-28 DIAGNOSIS — M069 Rheumatoid arthritis, unspecified: Secondary | ICD-10-CM | POA: Diagnosis not present

## 2014-12-28 DIAGNOSIS — M6281 Muscle weakness (generalized): Secondary | ICD-10-CM | POA: Diagnosis not present

## 2014-12-28 DIAGNOSIS — M1990 Unspecified osteoarthritis, unspecified site: Secondary | ICD-10-CM | POA: Diagnosis not present

## 2014-12-29 DIAGNOSIS — M1990 Unspecified osteoarthritis, unspecified site: Secondary | ICD-10-CM | POA: Diagnosis not present

## 2014-12-29 DIAGNOSIS — M069 Rheumatoid arthritis, unspecified: Secondary | ICD-10-CM | POA: Diagnosis not present

## 2014-12-29 DIAGNOSIS — M353 Polymyalgia rheumatica: Secondary | ICD-10-CM | POA: Diagnosis not present

## 2014-12-29 DIAGNOSIS — M6281 Muscle weakness (generalized): Secondary | ICD-10-CM | POA: Diagnosis not present

## 2014-12-29 DIAGNOSIS — R262 Difficulty in walking, not elsewhere classified: Secondary | ICD-10-CM | POA: Diagnosis not present

## 2014-12-29 DIAGNOSIS — M4800 Spinal stenosis, site unspecified: Secondary | ICD-10-CM | POA: Diagnosis not present

## 2015-01-02 DIAGNOSIS — M353 Polymyalgia rheumatica: Secondary | ICD-10-CM | POA: Diagnosis not present

## 2015-01-02 DIAGNOSIS — M4800 Spinal stenosis, site unspecified: Secondary | ICD-10-CM | POA: Diagnosis not present

## 2015-01-02 DIAGNOSIS — M1990 Unspecified osteoarthritis, unspecified site: Secondary | ICD-10-CM | POA: Diagnosis not present

## 2015-01-02 DIAGNOSIS — M6281 Muscle weakness (generalized): Secondary | ICD-10-CM | POA: Diagnosis not present

## 2015-01-02 DIAGNOSIS — R262 Difficulty in walking, not elsewhere classified: Secondary | ICD-10-CM | POA: Diagnosis not present

## 2015-01-02 DIAGNOSIS — M069 Rheumatoid arthritis, unspecified: Secondary | ICD-10-CM | POA: Diagnosis not present

## 2015-01-03 DIAGNOSIS — M069 Rheumatoid arthritis, unspecified: Secondary | ICD-10-CM | POA: Diagnosis not present

## 2015-01-03 DIAGNOSIS — M6281 Muscle weakness (generalized): Secondary | ICD-10-CM | POA: Diagnosis not present

## 2015-01-03 DIAGNOSIS — R262 Difficulty in walking, not elsewhere classified: Secondary | ICD-10-CM | POA: Diagnosis not present

## 2015-01-03 DIAGNOSIS — M353 Polymyalgia rheumatica: Secondary | ICD-10-CM | POA: Diagnosis not present

## 2015-01-03 DIAGNOSIS — M1990 Unspecified osteoarthritis, unspecified site: Secondary | ICD-10-CM | POA: Diagnosis not present

## 2015-01-03 DIAGNOSIS — M4800 Spinal stenosis, site unspecified: Secondary | ICD-10-CM | POA: Diagnosis not present

## 2015-01-05 DIAGNOSIS — M1990 Unspecified osteoarthritis, unspecified site: Secondary | ICD-10-CM | POA: Diagnosis not present

## 2015-01-05 DIAGNOSIS — M79609 Pain in unspecified limb: Secondary | ICD-10-CM | POA: Diagnosis not present

## 2015-01-05 DIAGNOSIS — M2570 Osteophyte, unspecified joint: Secondary | ICD-10-CM | POA: Diagnosis not present

## 2015-01-05 DIAGNOSIS — B351 Tinea unguium: Secondary | ICD-10-CM | POA: Diagnosis not present

## 2015-01-05 DIAGNOSIS — M6281 Muscle weakness (generalized): Secondary | ICD-10-CM | POA: Diagnosis not present

## 2015-01-05 DIAGNOSIS — M71572 Other bursitis, not elsewhere classified, left ankle and foot: Secondary | ICD-10-CM | POA: Diagnosis not present

## 2015-01-05 DIAGNOSIS — M4800 Spinal stenosis, site unspecified: Secondary | ICD-10-CM | POA: Diagnosis not present

## 2015-01-05 DIAGNOSIS — R262 Difficulty in walking, not elsewhere classified: Secondary | ICD-10-CM | POA: Diagnosis not present

## 2015-01-05 DIAGNOSIS — M353 Polymyalgia rheumatica: Secondary | ICD-10-CM | POA: Diagnosis not present

## 2015-01-05 DIAGNOSIS — M069 Rheumatoid arthritis, unspecified: Secondary | ICD-10-CM | POA: Diagnosis not present

## 2015-01-25 ENCOUNTER — Emergency Department (HOSPITAL_COMMUNITY): Payer: Medicare Other

## 2015-01-25 ENCOUNTER — Inpatient Hospital Stay (HOSPITAL_COMMUNITY): Payer: Medicare Other

## 2015-01-25 ENCOUNTER — Inpatient Hospital Stay (HOSPITAL_COMMUNITY)
Admission: EM | Admit: 2015-01-25 | Discharge: 2015-01-30 | DRG: 871 | Disposition: A | Discharge status: Home / Self Care | Payer: Medicare Other | Attending: Internal Medicine | Admitting: Internal Medicine

## 2015-01-25 ENCOUNTER — Encounter (HOSPITAL_COMMUNITY): Payer: Self-pay | Admitting: Emergency Medicine

## 2015-01-25 DIAGNOSIS — Z8673 Personal history of transient ischemic attack (TIA), and cerebral infarction without residual deficits: Secondary | ICD-10-CM | POA: Diagnosis not present

## 2015-01-25 DIAGNOSIS — Z7401 Bed confinement status: Secondary | ICD-10-CM

## 2015-01-25 DIAGNOSIS — E780 Pure hypercholesterolemia: Secondary | ICD-10-CM | POA: Diagnosis present

## 2015-01-25 DIAGNOSIS — E876 Hypokalemia: Secondary | ICD-10-CM | POA: Diagnosis present

## 2015-01-25 DIAGNOSIS — J189 Pneumonia, unspecified organism: Secondary | ICD-10-CM | POA: Diagnosis present

## 2015-01-25 DIAGNOSIS — I129 Hypertensive chronic kidney disease with stage 1 through stage 4 chronic kidney disease, or unspecified chronic kidney disease: Secondary | ICD-10-CM | POA: Diagnosis present

## 2015-01-25 DIAGNOSIS — A419 Sepsis, unspecified organism: Principal | ICD-10-CM | POA: Diagnosis present

## 2015-01-25 DIAGNOSIS — I10 Essential (primary) hypertension: Secondary | ICD-10-CM | POA: Diagnosis present

## 2015-01-25 DIAGNOSIS — R911 Solitary pulmonary nodule: Secondary | ICD-10-CM

## 2015-01-25 DIAGNOSIS — I2510 Atherosclerotic heart disease of native coronary artery without angina pectoris: Secondary | ICD-10-CM | POA: Diagnosis not present

## 2015-01-25 DIAGNOSIS — Z7952 Long term (current) use of systemic steroids: Secondary | ICD-10-CM

## 2015-01-25 DIAGNOSIS — I252 Old myocardial infarction: Secondary | ICD-10-CM | POA: Diagnosis not present

## 2015-01-25 DIAGNOSIS — Z87891 Personal history of nicotine dependence: Secondary | ICD-10-CM

## 2015-01-25 DIAGNOSIS — R0600 Dyspnea, unspecified: Secondary | ICD-10-CM

## 2015-01-25 DIAGNOSIS — E785 Hyperlipidemia, unspecified: Secondary | ICD-10-CM | POA: Diagnosis present

## 2015-01-25 DIAGNOSIS — J90 Pleural effusion, not elsewhere classified: Secondary | ICD-10-CM | POA: Diagnosis not present

## 2015-01-25 DIAGNOSIS — R509 Fever, unspecified: Secondary | ICD-10-CM

## 2015-01-25 DIAGNOSIS — M353 Polymyalgia rheumatica: Secondary | ICD-10-CM | POA: Diagnosis present

## 2015-01-25 DIAGNOSIS — I5043 Acute on chronic combined systolic (congestive) and diastolic (congestive) heart failure: Secondary | ICD-10-CM | POA: Diagnosis present

## 2015-01-25 DIAGNOSIS — D638 Anemia in other chronic diseases classified elsewhere: Secondary | ICD-10-CM | POA: Diagnosis present

## 2015-01-25 DIAGNOSIS — E440 Moderate protein-calorie malnutrition: Secondary | ICD-10-CM | POA: Diagnosis present

## 2015-01-25 DIAGNOSIS — T83028A Displacement of other indwelling urethral catheter, initial encounter: Secondary | ICD-10-CM | POA: Diagnosis present

## 2015-01-25 DIAGNOSIS — E039 Hypothyroidism, unspecified: Secondary | ICD-10-CM | POA: Diagnosis present

## 2015-01-25 DIAGNOSIS — Z6825 Body mass index (BMI) 25.0-25.9, adult: Secondary | ICD-10-CM

## 2015-01-25 DIAGNOSIS — I5032 Chronic diastolic (congestive) heart failure: Secondary | ICD-10-CM | POA: Diagnosis present

## 2015-01-25 DIAGNOSIS — Z951 Presence of aortocoronary bypass graft: Secondary | ICD-10-CM | POA: Diagnosis not present

## 2015-01-25 DIAGNOSIS — R404 Transient alteration of awareness: Secondary | ICD-10-CM | POA: Diagnosis not present

## 2015-01-25 DIAGNOSIS — N183 Chronic kidney disease, stage 3 unspecified: Secondary | ICD-10-CM | POA: Diagnosis present

## 2015-01-25 DIAGNOSIS — E860 Dehydration: Secondary | ICD-10-CM | POA: Diagnosis not present

## 2015-01-25 DIAGNOSIS — R339 Retention of urine, unspecified: Secondary | ICD-10-CM | POA: Diagnosis not present

## 2015-01-25 DIAGNOSIS — Z880 Allergy status to penicillin: Secondary | ICD-10-CM

## 2015-01-25 DIAGNOSIS — J181 Lobar pneumonia, unspecified organism: Secondary | ICD-10-CM | POA: Diagnosis present

## 2015-01-25 DIAGNOSIS — R918 Other nonspecific abnormal finding of lung field: Secondary | ICD-10-CM | POA: Diagnosis not present

## 2015-01-25 DIAGNOSIS — R531 Weakness: Secondary | ICD-10-CM | POA: Diagnosis not present

## 2015-01-25 DIAGNOSIS — S3730XA Unspecified injury of urethra, initial encounter: Secondary | ICD-10-CM | POA: Diagnosis not present

## 2015-01-25 LAB — URINALYSIS, ROUTINE W REFLEX MICROSCOPIC
Glucose, UA: NEGATIVE mg/dL
Ketones, ur: NEGATIVE mg/dL
LEUKOCYTES UA: NEGATIVE
Nitrite: NEGATIVE
Protein, ur: NEGATIVE mg/dL
Specific Gravity, Urine: 1.021 (ref 1.005–1.030)
UROBILINOGEN UA: 1 mg/dL (ref 0.0–1.0)
pH: 5.5 (ref 5.0–8.0)

## 2015-01-25 LAB — BASIC METABOLIC PANEL
Anion gap: 12 (ref 5–15)
BUN: 23 mg/dL (ref 6–23)
CHLORIDE: 104 mmol/L (ref 96–112)
CO2: 22 mmol/L (ref 19–32)
Calcium: 8.8 mg/dL (ref 8.4–10.5)
Creatinine, Ser: 1.28 mg/dL (ref 0.50–1.35)
GFR calc Af Amer: 54 mL/min — ABNORMAL LOW (ref >90)
GFR calc non Af Amer: 47 mL/min — ABNORMAL LOW (ref >90)
Glucose, Bld: 129 mg/dL — ABNORMAL HIGH (ref 70–99)
POTASSIUM: 3.6 mmol/L (ref 3.5–5.1)
Sodium: 138 mmol/L (ref 135–145)

## 2015-01-25 LAB — CBC WITH DIFFERENTIAL/PLATELET
BASOS PCT: 0 % (ref 0–1)
Basophils Absolute: 0 10*3/uL (ref 0.0–0.1)
Eosinophils Absolute: 0.1 10*3/uL (ref 0.0–0.7)
Eosinophils Relative: 1 % (ref 0–5)
HCT: 39.7 % (ref 39.0–52.0)
Hemoglobin: 13.3 g/dL (ref 13.0–17.0)
LYMPHS ABS: 1.7 10*3/uL (ref 0.7–4.0)
Lymphocytes Relative: 13 % (ref 12–46)
MCH: 33.4 pg (ref 26.0–34.0)
MCHC: 33.5 g/dL (ref 30.0–36.0)
MCV: 99.7 fL (ref 78.0–100.0)
Monocytes Absolute: 1.3 10*3/uL — ABNORMAL HIGH (ref 0.1–1.0)
Monocytes Relative: 9 % (ref 3–12)
NEUTROS PCT: 77 % (ref 43–77)
Neutro Abs: 10.5 10*3/uL — ABNORMAL HIGH (ref 1.7–7.7)
Platelets: 186 10*3/uL (ref 150–400)
RBC: 3.98 MIL/uL — ABNORMAL LOW (ref 4.22–5.81)
RDW: 14.2 % (ref 11.5–15.5)
WBC: 13.6 10*3/uL — ABNORMAL HIGH (ref 4.0–10.5)

## 2015-01-25 LAB — LACTIC ACID, PLASMA: LACTIC ACID, VENOUS: 1.1 mmol/L (ref 0.5–2.0)

## 2015-01-25 LAB — URINE MICROSCOPIC-ADD ON

## 2015-01-25 MED ORDER — LIDOCAINE HCL 2 % EX GEL
1.0000 "application " | Freq: Once | CUTANEOUS | Status: AC
  Administered 2015-01-25: 1 via TOPICAL
  Filled 2015-01-25: qty 20

## 2015-01-25 MED ORDER — CEFTRIAXONE SODIUM 1 G IJ SOLR
1.0000 g | Freq: Once | INTRAMUSCULAR | Status: AC
  Administered 2015-01-25: 1 g via INTRAVENOUS
  Filled 2015-01-25: qty 10

## 2015-01-25 MED ORDER — CLOPIDOGREL BISULFATE 75 MG PO TABS
75.0000 mg | ORAL_TABLET | Freq: Every day | ORAL | Status: DC
Start: 2015-01-26 — End: 2015-01-30
  Administered 2015-01-26 – 2015-01-30 (×5): 75 mg via ORAL
  Filled 2015-01-25 (×5): qty 1

## 2015-01-25 MED ORDER — LORATADINE 10 MG PO TABS
10.0000 mg | ORAL_TABLET | Freq: Every day | ORAL | Status: DC
  Administered 2015-01-26 – 2015-01-30 (×5): 10 mg via ORAL
  Filled 2015-01-25 (×5): qty 1

## 2015-01-25 MED ORDER — AZITHROMYCIN 500 MG IV SOLR
500.0000 mg | Freq: Once | INTRAVENOUS | Status: AC
  Administered 2015-01-25: 500 mg via INTRAVENOUS
  Filled 2015-01-25: qty 500

## 2015-01-25 MED ORDER — ONDANSETRON HCL 4 MG PO TABS: 4.0000 mg | ORAL_TABLET | Freq: Three times a day (TID) | ORAL | Status: DC | PRN

## 2015-01-25 MED ORDER — LEVOFLOXACIN IN D5W 750 MG/150ML IV SOLN
750.0000 mg | INTRAVENOUS | Status: DC
  Administered 2015-01-25 – 2015-01-27 (×2): 750 mg via INTRAVENOUS
  Filled 2015-01-25 (×2): qty 150

## 2015-01-25 MED ORDER — FENTANYL CITRATE 0.05 MG/ML IJ SOLN
25.0000 ug | Freq: Once | INTRAMUSCULAR | Status: AC
  Administered 2015-01-25: 25 ug via INTRAVENOUS
  Filled 2015-01-25: qty 2

## 2015-01-25 MED ORDER — LEVOFLOXACIN IN D5W 750 MG/150ML IV SOLN: 750.0000 mg | INTRAVENOUS | Status: DC

## 2015-01-25 MED ORDER — GABAPENTIN 300 MG PO CAPS
300.0000 mg | ORAL_CAPSULE | Freq: Three times a day (TID) | ORAL | Status: DC
  Administered 2015-01-25 – 2015-01-30 (×14): 300 mg via ORAL
  Filled 2015-01-25 (×16): qty 1

## 2015-01-25 MED ORDER — ATORVASTATIN CALCIUM 20 MG PO TABS
20.0000 mg | ORAL_TABLET | Freq: Every morning | ORAL | Status: DC
  Administered 2015-01-26 – 2015-01-30 (×5): 20 mg via ORAL
  Filled 2015-01-25 (×5): qty 1

## 2015-01-25 MED ORDER — NITROGLYCERIN 0.4 MG SL SUBL: 0.4000 mg | SUBLINGUAL_TABLET | SUBLINGUAL | Status: DC | PRN

## 2015-01-25 MED ORDER — BOOST PLUS PO LIQD
237.0000 mL | Freq: Three times a day (TID) | ORAL | Status: DC
  Administered 2015-01-26 – 2015-01-30 (×7): 237 mL via ORAL
  Filled 2015-01-25 (×18): qty 237

## 2015-01-25 MED ORDER — ACETAMINOPHEN 500 MG PO TABS: 1000.0000 mg | ORAL_TABLET | Freq: Three times a day (TID) | ORAL | Status: DC | PRN

## 2015-01-25 MED ORDER — TRAMADOL HCL 50 MG PO TABS: 50.0000 mg | ORAL_TABLET | Freq: Two times a day (BID) | ORAL | Status: DC | PRN

## 2015-01-25 MED ORDER — OXYCODONE-ACETAMINOPHEN 5-325 MG PO TABS
2.0000 | ORAL_TABLET | Freq: Once | ORAL | Status: AC
  Administered 2015-01-25: 2 via ORAL
  Filled 2015-01-25: qty 2

## 2015-01-25 MED ORDER — FENTANYL CITRATE 0.05 MG/ML IJ SOLN
50.0000 ug | Freq: Once | INTRAMUSCULAR | Status: AC
  Administered 2015-01-25: 50 ug via INTRAVENOUS
  Filled 2015-01-25: qty 2

## 2015-01-25 MED ORDER — PREDNISONE 10 MG PO TABS
10.0000 mg | ORAL_TABLET | Freq: Every day | ORAL | Status: DC
  Administered 2015-01-26 – 2015-01-30 (×5): 10 mg via ORAL
  Filled 2015-01-25 (×6): qty 1

## 2015-01-25 MED ORDER — LEVOTHYROXINE SODIUM 75 MCG PO TABS
75.0000 ug | ORAL_TABLET | Freq: Every day | ORAL | Status: DC
  Administered 2015-01-26 – 2015-01-30 (×5): 75 ug via ORAL
  Filled 2015-01-25 (×6): qty 1

## 2015-01-25 MED ORDER — SODIUM CHLORIDE 0.9 % IV SOLN
INTRAVENOUS | Status: DC
  Administered 2015-01-25: 14:00:00 via INTRAVENOUS
  Administered 2015-01-26: 1 mL via INTRAVENOUS
  Administered 2015-01-26 (×2): via INTRAVENOUS

## 2015-01-25 MED ORDER — HEPARIN SODIUM (PORCINE) 5000 UNIT/ML IJ SOLN
5000.0000 [IU] | Freq: Three times a day (TID) | INTRAMUSCULAR | Status: DC
  Administered 2015-01-25 – 2015-01-30 (×14): 5000 [IU] via SUBCUTANEOUS
  Filled 2015-01-25 (×16): qty 1

## 2015-01-25 MED ORDER — POLYETHYLENE GLYCOL 3350 17 G PO PACK
17.0000 g | PACK | Freq: Every day | ORAL | Status: DC
  Administered 2015-01-26 – 2015-01-30 (×5): 17 g via ORAL
  Filled 2015-01-25 (×5): qty 1

## 2015-01-25 MED ORDER — ASPIRIN EC 81 MG PO TBEC
81.0000 mg | DELAYED_RELEASE_TABLET | Freq: Every day | ORAL | Status: DC
  Administered 2015-01-25 – 2015-01-30 (×6): 81 mg via ORAL
  Filled 2015-01-25 (×6): qty 1

## 2015-01-25 MED ORDER — DOXAZOSIN MESYLATE 4 MG PO TABS
4.0000 mg | ORAL_TABLET | Freq: Every day | ORAL | Status: DC
  Administered 2015-01-26 – 2015-01-30 (×5): 4 mg via ORAL
  Filled 2015-01-25 (×5): qty 1

## 2015-01-25 MED ORDER — PANTOPRAZOLE SODIUM 40 MG PO TBEC
40.0000 mg | DELAYED_RELEASE_TABLET | Freq: Every day | ORAL | Status: DC
  Administered 2015-01-26 – 2015-01-30 (×5): 40 mg via ORAL
  Filled 2015-01-25 (×4): qty 1

## 2015-01-25 MED ORDER — FOLIC ACID 1 MG PO TABS
1.0000 mg | ORAL_TABLET | Freq: Every day | ORAL | Status: DC
  Administered 2015-01-26 – 2015-01-30 (×5): 1 mg via ORAL
  Filled 2015-01-25 (×5): qty 1

## 2015-01-25 MED ORDER — ADULT MULTIVITAMIN W/MINERALS CH
1.0000 | ORAL_TABLET | Freq: Every day | ORAL | Status: DC
  Administered 2015-01-26 – 2015-01-30 (×5): 1 via ORAL
  Filled 2015-01-25 (×5): qty 1

## 2015-01-25 MED ORDER — BOOST / RESOURCE BREEZE PO LIQD
1.0000 | Freq: Three times a day (TID) | ORAL | Status: DC
  Administered 2015-01-26 – 2015-01-30 (×13): 1 via ORAL

## 2015-01-25 MED ORDER — GUAIFENESIN 100 MG/5ML PO SYRP
200.0000 mg | ORAL_SOLUTION | ORAL | Status: DC | PRN
  Filled 2015-01-25: qty 10

## 2015-01-25 MED ORDER — CALCIUM CARBONATE 1250 (500 CA) MG PO TABS
1.0000 | ORAL_TABLET | Freq: Every day | ORAL | Status: DC
  Administered 2015-01-26 – 2015-01-30 (×5): 500 mg via ORAL
  Filled 2015-01-25 (×6): qty 1

## 2015-01-25 NOTE — ED Notes (Addendum)
Urology at bedside, foley catheter placed by urologist. pt tolerated with increased pain.  Red tinged urine noted to be draining from catheter, pt reports relief of bladder pain.

## 2015-01-25 NOTE — ED Provider Notes (Signed)
CSN: [id number]     Arrival date & time 01/25/15  1034 History   First MD Initiated Contact with Patient 01/25/15 1035     Chief Complaint  Patient presents with  . Weakness      HPI Pt here from Home with c/o fever and weakness , pt warm to touch , temp per home health nurse 100.8 Past Medical History  Diagnosis Date  . Chronic back pain   . Hypercholesteremia   . Hypothyroidism   . CAD (coronary artery disease)   . Chronic kidney disease (CKD), stage III (moderate)   . Polymyalgia rheumatica   . Left bundle branch block   . Hx of CABG   . History of nephrectomy   . Internal carotid artery stenosis   . Myocardial infarction   . CHF (congestive heart failure)   . Stroke    Past Surgical History  Procedure Laterality Date  . Coronary artery bypass graft    . Cholecystectomy    . Kidney surgery      kidney removed   Family History  Problem Relation Age of Onset  . Other Mother     [place] Flu  . Heart disease Father    History  Substance Use Topics  . Smoking status: Former Smoker    Types: Cigarettes    Quit date: 11/10/1970  . Smokeless tobacco: Never Used  . Alcohol Use: No    Review of Systems  All other systems reviewed and are negative.     Allergies  Penicillins  Home Medications   Prior to Admission medications   Medication Sig Start Date End Date Taking? Authorizing Provider  acetaminophen (TYLENOL) 500 MG tablet Take 1,000 mg by mouth every 8 (eight) hours as needed for pain.   Yes Historical Provider, MD  amLODipine (NORVASC) 2.5 MG tablet Take 2.5 mg by mouth daily.   Yes Historical Provider, MD  aspirin 81 MG tablet Take 81 mg by mouth daily.   Yes Historical Provider, MD  atorvastatin (LIPITOR) 20 MG tablet Take 20 mg by mouth every morning.    Yes Historical Provider, MD  calcium carbonate (OS-CAL - DOSED IN MG OF ELEMENTAL CALCIUM) 1250 MG tablet Take 1 tablet by mouth daily with breakfast.   Yes Historical Provider, MD  clopidogrel  (PLAVIX) 75 MG tablet Take 75 mg by mouth daily.     Yes Historical Provider, MD  doxazosin (CARDURA) 4 MG tablet Take 4 mg by mouth daily.    Yes Historical Provider, MD  fexofenadine (ALLEGRA) 180 MG tablet Take 180 mg by mouth daily.   Yes Historical Provider, MD  folic acid (FOLVITE) 1 MG tablet Take 1 mg by mouth daily.   Yes Historical Provider, MD  gabapentin (NEURONTIN) 300 MG capsule Take 300 mg by mouth 3 (three) times daily.   Yes Historical Provider, MD  levothyroxine (SYNTHROID, LEVOTHROID) 75 MCG tablet Take 75 mcg by mouth daily.   Yes Historical Provider, MD  methotrexate (RHEUMATREX) 2.5 MG tablet Take 10 mg by mouth every Wednesday.   Yes Historical Provider, MD  Multiple Vitamins-Minerals (MULTIVITAMINS THER. W/MINERALS) TABS Take 1 tablet by mouth daily.     Yes Historical Provider, MD  nitroGLYCERIN (NITROSTAT) 0.4 MG SL tablet Place 0.4 mg under the tongue every 5 (five) minutes as needed for chest pain.   Yes Historical Provider, MD  omeprazole (PRILOSEC) 20 MG capsule Take 20 mg by mouth daily.   Yes Historical Provider, MD  ondansetron (ZOFRAN) 4 MG  tablet Take 4 mg by mouth every 8 (eight) hours as needed for nausea or vomiting.   Yes Historical Provider, MD  polyethylene glycol (MIRALAX / GLYCOLAX) packet Take 17 g by mouth daily.    Yes Historical Provider, MD  predniSONE (DELTASONE) 5 MG tablet Take 10 mg by mouth daily with breakfast.    Yes Historical Provider, MD  traMADol (ULTRAM) 50 MG tablet Take one tablet by mouth twice daily for pains Patient taking differently: Take 50 mg by mouth every 12 (twelve) hours as needed for moderate pain.  03/06/14  Yes Tiffany L Reed, DO  methylPREDNISolone (MEDROL DOSEPAK) 4 MG tablet follow package directions Patient not taking: Reported on 01/25/2015 10/31/14   Margaretann Loveless, MD   BP 141/68 mmHg  Pulse 68  Temp(Src) 98.3 F (36.8 C) (Oral)  Resp 19  Ht 6' (1.829 m)  Wt 191 lb 9.3 oz (86.9 kg)  BMI 25.98 kg/m2  SpO2  92% Physical Exam  Constitutional: He appears well-developed and well-nourished. No distress.  HENT:  Head: Normocephalic and atraumatic.  Eyes: Pupils are equal, round, and reactive to light.  Neck: Normal range of motion.  Cardiovascular: Normal rate and intact distal pulses.   Pulmonary/Chest: No respiratory distress.  Abdominal: Normal appearance. He exhibits no distension. There is no tenderness. There is no rebound.  Musculoskeletal: Normal range of motion.  Neurological: He is alert. No cranial nerve deficit.  Skin: Skin is warm and dry. No rash noted.  Psychiatric: He has a normal mood and affect. His behavior is normal.  Nursing note and vitals reviewed.   ED Course  Procedures (including critical care time) Labs Review Labs Reviewed  CBC WITH DIFFERENTIAL/PLATELET - Abnormal; Notable for the following:    WBC 13.6 (*)    RBC 3.98 (*)    Neutro Abs 10.5 (*)    Monocytes Absolute 1.3 (*)    All other components within normal limits  BASIC METABOLIC PANEL - Abnormal; Notable for the following:    Glucose, Bld 129 (*)    GFR calc non Af Amer 47 (*)    GFR calc Af Amer 54 (*)    All other components within normal limits  URINALYSIS, ROUTINE W REFLEX MICROSCOPIC - Abnormal; Notable for the following:    Color, Urine AMBER (*)    Hgb urine dipstick LARGE (*)    Bilirubin Urine SMALL (*)    All other components within normal limits  COMPREHENSIVE METABOLIC PANEL - Abnormal; Notable for the following:    Glucose, Bld 105 (*)    Creatinine, Ser 1.46 (*)    Calcium 8.2 (*)    Total Protein 4.9 (*)    Albumin 2.1 (*)    GFR calc non Af Amer 40 (*)    GFR calc Af Amer 46 (*)    All other components within normal limits  CBC WITH DIFFERENTIAL/PLATELET - Abnormal; Notable for the following:    WBC 12.8 (*)    RBC 3.64 (*)    Hemoglobin 12.0 (*)    HCT 36.5 (*)    MCV 100.3 (*)    Neutrophils Relative % 79 (*)    Neutro Abs 10.2 (*)    All other components within  normal limits  GLUCOSE, CAPILLARY - Abnormal; Notable for the following:    Glucose-Capillary 155 (*)    All other components within normal limits  BASIC METABOLIC PANEL - Abnormal; Notable for the following:    Glucose, Bld 207 (*)    Creatinine,  Ser 1.46 (*)    Calcium 7.7 (*)    GFR calc non Af Amer 40 (*)    GFR calc Af Amer 46 (*)    All other components within normal limits  CBC - Abnormal; Notable for the following:    RBC 3.12 (*)    Hemoglobin 10.3 (*)    HCT 31.3 (*)    MCV 100.3 (*)    All other components within normal limits  BASIC METABOLIC PANEL - Abnormal; Notable for the following:    Potassium 3.4 (*)    Chloride 113 (*)    Creatinine, Ser 1.39 (*)    Calcium 7.8 (*)    GFR calc non Af Amer 42 (*)    GFR calc Af Amer 49 (*)    All other components within normal limits  CBC - Abnormal; Notable for the following:    RBC 3.31 (*)    Hemoglobin 11.1 (*)    HCT 33.2 (*)    MCV 100.3 (*)    All other components within normal limits  BASIC METABOLIC PANEL - Abnormal; Notable for the following:    Creatinine, Ser 1.39 (*)    Calcium 8.1 (*)    GFR calc non Af Amer 42 (*)    GFR calc Af Amer 49 (*)    All other components within normal limits  CULTURE, BLOOD (ROUTINE X 2)  CULTURE, BLOOD (ROUTINE X 2)  URINE CULTURE  CULTURE, EXPECTORATED SPUTUM-ASSESSMENT  GRAM STAIN  LACTIC ACID, PLASMA  INFLUENZA PANEL BY PCR (TYPE A & B, H1N1)  HIV ANTIBODY (ROUTINE TESTING)  STREP PNEUMONIAE URINARY ANTIGEN  URINE MICROSCOPIC-ADD ON  LEGIONELLA ANTIGEN, URINE    Imaging Review Dg Chest 2 View  01/28/2015   CLINICAL DATA:  Dyspnea.  Follow-up exam.  EXAM: CHEST  2 VIEW  COMPARISON:  01/25/2015  FINDINGS: Left lower lobe opacity consistent with either atelectasis or infiltrate and small effusion are without significant change from the previous study.  No new lung opacities.  No right pleural effusion.  No pneumothorax.  Changes from CABG surgery are stable.  IMPRESSION:  1. No change from the previous exam. 2. Persistent opacity at the left lung base consistent with a combination of a small effusion in either atelectasis or infiltrate. No new lung opacities.   Electronically Signed   By: Lajean Manes M.D.   On: 01/28/2015 08:45     EKG Interpretation None     Tech inflated balloon from foley prior to urine return.  I was unable to pass Coud cath.  Urology consulted for foley placement.  Patient admitted with fever. MDM   Final diagnoses:  Fever        Leonard Schwartz, MD 01/29/15 1059

## 2015-01-25 NOTE — Consult Note (Signed)
Urology Consult  Referring physician: Erin Hearing, NP  Lala Lund MD) Reason for referral: Urinary retention, urethral trauma, inability to place Foley catheter  History of Present Illness: Mr. Barry Wright is 81 years of age. He is urologic history includes a left nephrectomy in 2001 due to a nonfunctioning chronically infected left kidney. He was evaluated today in the emergency room for a febrile illness and is thought to have a pneumonia. Initial urinalysis was unremarkable. The indications for placement of a Foley catheter were unclear to me. A catheter was placed by one of the nursing techs and it was quickly realized that the balloon was inflated within the urethra. Blood per urethra was noted. Additional bladder scan showed evidence of a distended bladder. Additional attempts were made to place a Foley catheter including with a coud unsuccessfully and urology was consulted. The patient is currently quite uncomfortable with lower abdominal discomfort and distention. There is ongoing mild oozing from his penile meatus. He does report some slight dysuria prior to admission but again initial urinalysis was clear. He's had some slight slowing of his urinary stream but really denies any major voiding complaints. He denies any recent gross hematuria. He's had no abdominal discomfort. He denies any incontinence.  Past Medical History  Diagnosis Date  . Chronic back pain   . Hypercholesteremia   . Hypothyroidism   . CAD (coronary artery disease)   . Chronic kidney disease (CKD), stage III (moderate)   . Polymyalgia rheumatica   . Left bundle branch block   . Hx of CABG   . History of nephrectomy   . Internal carotid artery stenosis   . Myocardial infarction   . CHF (congestive heart failure)   . Stroke    Past Surgical History  Procedure Laterality Date  . Coronary artery bypass graft    . Cholecystectomy    . Kidney surgery      kidney removed    Medications:  Prior to Admission:   Prescriptions prior to admission  Medication Sig Dispense Refill Last Dose  . acetaminophen (TYLENOL) 500 MG tablet Take 1,000 mg by mouth every 8 (eight) hours as needed for pain.   unknown  . amLODipine (NORVASC) 2.5 MG tablet Take 2.5 mg by mouth daily.   01/25/2015 at Unknown time  . aspirin 81 MG tablet Take 81 mg by mouth daily.   01/24/2015 at Unknown time  . atorvastatin (LIPITOR) 20 MG tablet Take 20 mg by mouth every morning.    01/25/2015 at Unknown time  . calcium carbonate (OS-CAL - DOSED IN MG OF ELEMENTAL CALCIUM) 1250 MG tablet Take 1 tablet by mouth daily with breakfast.   01/24/2015 at Unknown time  . clopidogrel (PLAVIX) 75 MG tablet Take 75 mg by mouth daily.     01/25/2015 at Unknown time  . doxazosin (CARDURA) 4 MG tablet Take 4 mg by mouth daily.    01/25/2015 at Unknown time  . fexofenadine (ALLEGRA) 180 MG tablet Take 180 mg by mouth daily.   01/24/2015 at Unknown time  . folic acid (FOLVITE) 1 MG tablet Take 1 mg by mouth daily.   01/24/2015 at Unknown time  . gabapentin (NEURONTIN) 300 MG capsule Take 300 mg by mouth 3 (three) times daily.   01/24/2015 at Unknown time  . levothyroxine (SYNTHROID, LEVOTHROID) 75 MCG tablet Take 75 mcg by mouth daily.   01/25/2015 at Unknown time  . methotrexate (RHEUMATREX) 2.5 MG tablet Take 10 mg by mouth every Wednesday.   01/24/2015 at  Unknown time  . Multiple Vitamins-Minerals (MULTIVITAMINS THER. W/MINERALS) TABS Take 1 tablet by mouth daily.     01/24/2015 at Unknown time  . nitroGLYCERIN (NITROSTAT) 0.4 MG SL tablet Place 0.4 mg under the tongue every 5 (five) minutes as needed for chest pain.   unknown  . omeprazole (PRILOSEC) 20 MG capsule Take 20 mg by mouth daily.   01/24/2015 at Unknown time  . ondansetron (ZOFRAN) 4 MG tablet Take 4 mg by mouth every 8 (eight) hours as needed for nausea or vomiting.   unknown  . polyethylene glycol (MIRALAX / GLYCOLAX) packet Take 17 g by mouth daily.    01/24/2015 at Unknown time  . predniSONE  (DELTASONE) 5 MG tablet Take 10 mg by mouth daily with breakfast.    01/25/2015 at Unknown time  . traMADol (ULTRAM) 50 MG tablet Take one tablet by mouth twice daily for pains (Patient taking differently: Take 50 mg by mouth every 12 (twelve) hours as needed for moderate pain. ) 60 tablet 5 01/25/2015 at Unknown time  . methylPREDNISolone (MEDROL DOSEPAK) 4 MG tablet follow package directions (Patient not taking: Reported on 01/25/2015) 21 tablet 0 Completed Course at Unknown time    Allergies:  Allergies  Allergen Reactions  . Penicillins Rash    Family History  Problem Relation Age of Onset  . Other Mother     [place] Flu  . Heart disease Father     Social History:  reports that he quit smoking about 44 years ago. His smoking use included Cigarettes. He has never used smokeless tobacco. He reports that he does not drink alcohol or use illicit drugs.  ROS negative except for pertinent positives above.  Physical Exam:  Vital signs in last 24 hours: Temp:  [98.3 F (36.8 C)-100.5 F (38.1 C)] 100.3 F (37.9 C) (03/17 1117) Pulse Rate:  [77-93] 79 (03/17 1830) Resp:  [15-30] 22 (03/17 1830) BP: (112-162)/(54-108) 151/62 mmHg (03/17 1830) SpO2:  [92 %-100 %] 95 % (03/17 1830)  Constitutional: Vital signs reviewed. WD WN in NAD Head: Normocephalic and atraumatic   Eyes: PERRL, No scleral icterus.  Neck: Supple No  Gross JVD, mass Cardiovascular: RRR Pulmonary/Chest: Normal effort Abdominal: Soft. Non-tender, non-distended, bowel sounds are normal, no masses, organomegaly, or guarding present.  Genitourinary: Blood per meatus. Slight bilateral testicular atrophy. No other significant abnormalities noted. Extremities: No cyanosis or edema  Neurological: Grossly non-focal.  Skin: Warm,very dry and intact. No rash, cyanosis   Laboratory Data:  Results for orders placed or performed during the hospital encounter of 01/25/15 (from the past 72 hour(s))  CBC with  Differential/Platelet     Status: Abnormal   Collection Time: 01/25/15 11:08 AM  Result Value Ref Range   WBC 13.6 (H) 4.0 - 10.5 K/uL   RBC 3.98 (L) 4.22 - 5.81 MIL/uL   Hemoglobin 13.3 13.0 - 17.0 g/dL   HCT 39.7 39.0 - 52.0 %   MCV 99.7 78.0 - 100.0 fL   MCH 33.4 26.0 - 34.0 pg   MCHC 33.5 30.0 - 36.0 g/dL   RDW 14.2 11.5 - 15.5 %   Platelets 186 150 - 400 K/uL   Neutrophils Relative % 77 43 - 77 %   Neutro Abs 10.5 (H) 1.7 - 7.7 K/uL   Lymphocytes Relative 13 12 - 46 %   Lymphs Abs 1.7 0.7 - 4.0 K/uL   Monocytes Relative 9 3 - 12 %   Monocytes Absolute 1.3 (H) 0.1 - 1.0 K/uL  Eosinophils Relative 1 0 - 5 %   Eosinophils Absolute 0.1 0.0 - 0.7 K/uL   Basophils Relative 0 0 - 1 %   Basophils Absolute 0.0 0.0 - 0.1 K/uL  Basic metabolic panel     Status: Abnormal   Collection Time: 01/25/15 11:08 AM  Result Value Ref Range   Sodium 138 135 - 145 mmol/L   Potassium 3.6 3.5 - 5.1 mmol/L   Chloride 104 96 - 112 mmol/L   CO2 22 19 - 32 mmol/L   Glucose, Bld 129 (H) 70 - 99 mg/dL   BUN 23 6 - 23 mg/dL   Creatinine, Ser 1.28 0.50 - 1.35 mg/dL   Calcium 8.8 8.4 - 10.5 mg/dL   GFR calc non Af Amer 47 (L) >90 mL/min   GFR calc Af Amer 54 (L) >90 mL/min    Comment: (NOTE) The eGFR has been calculated using the CKD EPI equation. This calculation has not been validated in all clinical situations. eGFR's persistently <90 mL/min signify possible Chronic Kidney Disease.    Anion gap 12 5 - 15   No results found for this or any previous visit (from the past 240 hour(s)). Creatinine:  Recent Labs  01/25/15 1108  CREATININE 1.28   Baseline Creatinine:   Impression/Assessment:  urethral trauma due to malposition Foley catheter with inflated balloon within the urethra.  Flexible bedside cystoscopy was performed. Patient's penis was prepped and draped in usual manner. Lidocaine jelly was instilled. On cystoscopy the patient had a large posterior false passage within the proximal  penile and distal bulbar urethra. I was able to direct the cystoscope more anteriorly and into the bladder. There I found a markedly distended bladder without gross evidence of other pathology. A guidewire was placed into the bladder. Over the guidewire we placed a 16 [place] council tip Foley catheter. Approximately 800 mL of clear urine was obtained.    Urethral trauma. It is difficult to know if there was any underlying pathology prior to attempts at placement of a Foley catheter and inflation of the Foley balloon within the urethra. His Foley catheter needs to remain indwelling for a minimum of 4-5 days. The patient can then have a voiding trial. He should follow-up with his primary urologist to previously was Dr. Gaynelle Arabian. Attallah Ontko S 01/25/2015, 6:53 PM

## 2015-01-25 NOTE — ED Notes (Signed)
Insertion site clean dry and intacted, but not draining.

## 2015-01-25 NOTE — H&P (Signed)
[facility] History and Physical                                                                                    Barry Wright, is a 81 y.o. male  MRN: [id number]   DOB - [birth year]  Admit Date - 01/25/2015  Outpatient Primary MD for the patient is Mathews Argyle, MD  With History of -  Past Medical History  Diagnosis Date  . Chronic back pain   . Hypercholesteremia   . Hypothyroidism   . CAD (coronary artery disease)   . Chronic kidney disease (CKD), stage III (moderate)   . Polymyalgia rheumatica   . Left bundle branch block   . Hx of CABG   . History of nephrectomy   . Internal carotid artery stenosis   . Myocardial infarction   . CHF (congestive heart failure)   . Stroke       Past Surgical History  Procedure Laterality Date  . Coronary artery bypass graft    . Cholecystectomy    . Kidney surgery      kidney removed    in for   Chief Complaint  Patient presents with  . Weakness     HPI 81 year old male patient with history of CAD, polymyalgia rheumatica on chronic prednisone, chronic kidney disease stage III, dyslipidemia, hypothyroidism and chronic diastolic heart failure. Patient resides at home with his wife but is non-ambulatory due to issues with his lower extremities. Patient and wife have caretaker who assists patient out of bed to the chair at home. Patient was sent to the ER by the caretaker, she found the patient febrile this morning. Patient denied any cough or other upper respiratory symptoms. He denied any sensation of chills and myalgias or headache. He did notice poor appetite for the past 2-3 days. He was also complaining that his mouth was dry. He also felt he had been expressing generalized weakness for the past 24 hours.  In the ER the patient was found to be mildly febrile with a temperature of 100.3 rectally. He was not hypoxic. He was hemodynamically stable with systolic blood pressure 300 and mildly elevated heart rate 94, he  did not have any tachypnea. He also had leukocytosis with a white count of 13,600 and absolute neutrophils 10.5%. Chest x-ray revealed left lower lobe infiltrate as well as left lower lobe nodule. Prior to our examining the patient to ER placed a Foley catheter in the patient.    Review of Systems   In addition to the HPI above,  No Fever-chills, myalgias or other constitutional symptoms; did endorse generalized weakness for 24 hours No Headache, changes with Vision or hearing, new weakness, tingling, numbness in any extremity, No problems swallowing food or Liquids, indigestion/reflux No Chest pain, Cough or Shortness of Breath, palpitations, orthopnea or DOE No Abdominal pain, N/V; no melena or hematochezia, no dark tarry stools, Bowel movements are regular, No dysuria, hematuria or flank pain No new skin rashes, lesions, masses or bruises, No new joints pains-aches No recent weight gain or loss No polyuria, polydypsia or polyphagia,  *A full 10 point Review of Systems was done, except as stated above,  all other Review of Systems were negative.  Social History History  Substance Use Topics  . Smoking status: Former Smoker    Types: Cigarettes    Quit date: 11/10/1970  . Smokeless tobacco: Never Used  . Alcohol Use: No    Family History Family History  Problem Relation Age of Onset  . Other Mother     [place] Flu  . Heart disease Father     Prior to Admission medications   Medication Sig Start Date End Date Taking? Authorizing Provider  acetaminophen (TYLENOL) 500 MG tablet Take 1,000 mg by mouth every 8 (eight) hours as needed for pain.   Yes Historical Provider, MD  amLODipine (NORVASC) 2.5 MG tablet Take 2.5 mg by mouth daily.   Yes Historical Provider, MD  aspirin 81 MG tablet Take 81 mg by mouth daily.   Yes Historical Provider, MD  atorvastatin (LIPITOR) 20 MG tablet Take 20 mg by mouth every morning.    Yes Historical Provider, MD  calcium carbonate (OS-CAL -  DOSED IN MG OF ELEMENTAL CALCIUM) 1250 MG tablet Take 1 tablet by mouth daily with breakfast.   Yes Historical Provider, MD  clopidogrel (PLAVIX) 75 MG tablet Take 75 mg by mouth daily.     Yes Historical Provider, MD  doxazosin (CARDURA) 4 MG tablet Take 4 mg by mouth daily.    Yes Historical Provider, MD  fexofenadine (ALLEGRA) 180 MG tablet Take 180 mg by mouth daily.   Yes Historical Provider, MD  folic acid (FOLVITE) 1 MG tablet Take 1 mg by mouth daily.   Yes Historical Provider, MD  gabapentin (NEURONTIN) 300 MG capsule Take 300 mg by mouth 3 (three) times daily.   Yes Historical Provider, MD  levothyroxine (SYNTHROID, LEVOTHROID) 75 MCG tablet Take 75 mcg by mouth daily.   Yes Historical Provider, MD  methotrexate (RHEUMATREX) 2.5 MG tablet Take 10 mg by mouth every Wednesday.   Yes Historical Provider, MD  Multiple Vitamins-Minerals (MULTIVITAMINS THER. W/MINERALS) TABS Take 1 tablet by mouth daily.     Yes Historical Provider, MD  nitroGLYCERIN (NITROSTAT) 0.4 MG SL tablet Place 0.4 mg under the tongue every 5 (five) minutes as needed for chest pain.   Yes Historical Provider, MD  omeprazole (PRILOSEC) 20 MG capsule Take 20 mg by mouth daily.   Yes Historical Provider, MD  ondansetron (ZOFRAN) 4 MG tablet Take 4 mg by mouth every 8 (eight) hours as needed for nausea or vomiting.   Yes Historical Provider, MD  polyethylene glycol (MIRALAX / GLYCOLAX) packet Take 17 g by mouth daily.    Yes Historical Provider, MD  predniSONE (DELTASONE) 5 MG tablet Take 10 mg by mouth daily with breakfast.    Yes Historical Provider, MD  traMADol (ULTRAM) 50 MG tablet Take one tablet by mouth twice daily for pains Patient taking differently: Take 50 mg by mouth every 12 (twelve) hours as needed for moderate pain.  03/06/14  Yes Tiffany Lynelle Doctor, DO  methylPREDNISolone (MEDROL DOSEPAK) 4 MG tablet follow package directions Patient not taking: Reported on 01/25/2015 10/31/14   Margaretann Loveless, MD    Allergies    Allergen Reactions  . Penicillins Rash    Physical Exam  Vitals  Blood pressure 118/59, pulse 82, temperature 100.3 F (37.9 C), temperature source Rectal, resp. rate 22, SpO2 96 %.   General:  In no acute distress, appears chronically ill  Psych:  Normal affect, Denies Suicidal or Homicidal ideations, Awake Alert, Oriented X 3. Speech and  thought patterns are clear and appropriate, no apparent short term memory deficits  Neuro:   No focal neurological deficits, CN II through XII intact, Strength 5/5 all 4 extremities, Sensation intact all 4 extremities.  ENT:  Ears and Eyes appear Normal, Conjunctivae clear, PER. Very dry oral mucosa without erythema or exudates.  Neck:  Supple, No lymphadenopathy appreciated  Respiratory:  Symmetrical chest wall movement, decreased air movement with diminished lung sounds in bases, coarse to auscultation, patient unable to assist with exam so unable to auscultate lungs posteriorly, Room Air  Cardiac:  RRR, No Murmurs, no LE edema noted, no JVD, No carotid bruits, peripheral pulses palpable at 2+ except for right pedal which was nonpalpable and dorsalis pedis was 1+, right foot slightly cooler to touch  Abdomen:  Positive bowel sounds, Soft, Non tender, Non distended,  No masses appreciated, no obvious hepatosplenomegaly  Skin:  No Cyanosis, Normal Skin Turgor, No Skin Rash or Bruise.  Extremities: Symmetrical without obvious trauma or injury,  no effusions.  Data Review  CBC  Recent Labs Lab 01/25/15 1108  WBC 13.6*  HGB 13.3  HCT 39.7  PLT 186  MCV 99.7  MCH 33.4  MCHC 33.5  RDW 14.2  LYMPHSABS 1.7  MONOABS 1.3*  EOSABS 0.1  BASOSABS 0.0    Chemistries   Recent Labs Lab 01/25/15 1108  NA 138  K 3.6  CL 104  CO2 22  GLUCOSE 129*  BUN 23  CREATININE 1.28  CALCIUM 8.8    CrCl cannot be calculated (Unknown ideal weight.).  No results for input(s): TSH, T4TOTAL, T3FREE, THYROIDAB in the last 72 hours.  Invalid  input(s): FREET3  Coagulation profile No results for input(s): INR, PROTIME in the last 168 hours.  No results for input(s): DDIMER in the last 72 hours.  Cardiac Enzymes No results for input(s): CKMB, TROPONINI, MYOGLOBIN in the last 168 hours.  Invalid input(s): CK  Invalid input(s): POCBNP  Urinalysis    Component Value Date/Time   COLORURINE YELLOW 02/25/2014 2020   APPEARANCEUR CLEAR 02/25/2014 2020   LABSPEC 1.018 02/25/2014 2020   PHURINE 5.5 02/25/2014 2020   GLUCOSEU NEGATIVE 02/25/2014 2020   HGBUR NEGATIVE 02/25/2014 2020   BILIRUBINUR NEGATIVE 02/25/2014 2020   KETONESUR NEGATIVE 02/25/2014 2020   PROTEINUR NEGATIVE 02/25/2014 2020   UROBILINOGEN 1.0 02/25/2014 2020   NITRITE NEGATIVE 02/25/2014 2020   LEUKOCYTESUR NEGATIVE 02/25/2014 2020    Imaging results:   Dg Chest 2 View  01/25/2015   CLINICAL DATA:  Initial evaluation for fever for 2 days  EXAM: CHEST  2 VIEW  COMPARISON:  12/26/2014  FINDINGS: Mild to moderate cardiac enlargement status post CABG, unchanged. Vascular pattern normal. No right-sided infiltrate or effusion. 2 cm nodular opacity over the left lower lobe. Mildly increased left lower lobe markings. Blunting left costophrenic angle.  IMPRESSION: Findings suggest left lower lobe infiltrate with small associated pleural effusion concerning for pneumonia. There is also a nodular density over the left lower lobe for which CT thorax is suggested.   Electronically Signed   By: Skipper Cliche M.D.   On: 01/25/2015 12:29   Dg Chest 2 View  12/27/2014   CLINICAL DATA:  Abnormal chest sounds on physical exam  EXAM: CHEST  2 VIEW  COMPARISON:  04/06/2014  FINDINGS: Cardiomediastinal silhouette is stable. Status post CABG. No acute infiltrate or pleural effusion. Mild elevation of the right hemidiaphragm. Mild degenerative changes thoracic spine.  IMPRESSION: No active cardiopulmonary disease. Status post CABG. Chronic  mild elevation of the right hemidiaphragm.    Electronically Signed   By: Lahoma Crocker M.D.   On: 12/27/2014 08:15     Assessment & Plan  Active Problems:   CAP /left lung nodule -Admit to medical floor -Currently is not hypoxic but with rehydration monitor for progression of respiratory symptoms therefore monitor continuous pulse oximetry -Has penicillin allergy so we will utilize Levaquin; if symptoms worsen or if patient does not improve after 48 hours since he is on chronic prednisone may need to broaden antibiotic coverage -Follow-up on sputum and blood cultures -At this juncture is not experiencing any wheezing or significant cough so no indication for nebulizers or cough suppressants; will add guaifenesin as mucolytic -Begin flutter valve -At baseline patient is bed bound with baseline activity out of bed to chair with assistance, make sure patient is at least getting out of bed to chair with assistance -No prior tobacco abuse; have ordered CT chest without contrast to better clarify-suspect may be related to underlying infectious process -Check influenza PCR    Dehydration -IV fluid at 100 mL/h -Expect with rehydration he may develop worsening of respiratory symptoms or in the minimum a more congested clinical exam    Chronic kidney disease , stage III (moderate) -Renal function is at baseline    Essential hypertension, benign -Blood pressure appears to be somewhat soft in setting of volume depletion so we'll continue Cardura but will hold Norvasc for now    Chronic diastolic heart failure -Currently compensated but monitor closely during administration of IV fluids    Polymyalgia rheumatica -Continue home dose prednisone -Is not hypotensive so no indication for stress dose steroids at this juncture    Coronary artery disease -Currently asymptomatic    Hypothyroidism -Continue Synthroid    Protein-calorie malnutrition, moderate -Provide protein supplements; may benefit from formal nutritional consultation this  admission -Suspect current anorexia related to underlying pneumonia -Given limited mobility and bed bound status patient could be experiencing issues related to odynophagia or dysphagia so we'll have speech therapy evaluate    DVT Prophylaxis: Subcutaneous heparin  Family Communication:   No family at bedside  Code Status:  Full code-patient confirms that he does not have a living will  Condition:  Stable  Time spent in minutes : 60   Amora Sheehy L. ANP on 01/25/2015 at 1:26 PM  Between 7am to 7pm - Pager - [phone]  After 7pm go to [email or web address] - password [email or web address]  And look for the night coverage person covering me after hours  [facility]

## 2015-01-25 NOTE — ED Notes (Signed)
Report from nicole, rn.  Pt care assumed 

## 2015-01-25 NOTE — ED Notes (Signed)
Pt here from  Home with c/o fever and weakness , pt warm to touch , temp per home health nurse 100.8

## 2015-01-25 NOTE — Progress Notes (Signed)
ANTIBIOTIC CONSULT NOTE - INITIAL  Pharmacy Consult:  Pharmacy to renally adjust antibiotics  Allergies  Allergen Reactions  . Penicillins Rash    Patient Measurements: Height: 6' (182.9 cm) Weight: 180 lb 1.6 oz (81.693 kg) IBW/kg (Calculated) : 77.6  Vital Signs: Temp: 100.3 F (37.9 C) (03/17 1117) Temp Source: Rectal (03/17 1117) BP: 151/62 mmHg (03/17 1830) Pulse Rate: 79 (03/17 1830) Intake/Output from this shift: Total I/O In: -  Out: 350 [Urine:350]  Labs:  Recent Labs  01/25/15 1108  WBC 13.6*  HGB 13.3  PLT 186  CREATININE 1.28   Estimated Creatinine Clearance: 40.4 mL/min (by C-G formula based on Cr of 1.28). No results for input(s): VANCOTROUGH, VANCOPEAK, VANCORANDOM, GENTTROUGH, GENTPEAK, GENTRANDOM, TOBRATROUGH, TOBRAPEAK, TOBRARND, AMIKACINPEAK, AMIKACINTROU, AMIKACIN in the last 72 hours.   Microbiology: No results found for this or any previous visit (from the past 720 hour(s)).  Medical History: Past Medical History  Diagnosis Date  . Chronic back pain   . Hypercholesteremia   . Hypothyroidism   . CAD (coronary artery disease)   . Chronic kidney disease (CKD), stage III (moderate)   . Polymyalgia rheumatica   . Left bundle branch block   . Hx of CABG   . History of nephrectomy   . Internal carotid artery stenosis   . Myocardial infarction   . CHF (congestive heart failure)   . Stroke      Assessment: 18 YOF with CAP to start levofloxacin.  Patient has reduced renal clearance and it appears her renal function is at its best right now (SCr range 1.28-1.65).   Goal of Therapy:  Resolution of infection   Plan:  - Change Levaquin to 750mg  IV Q48H, continue for 5 days per original order - Pharmacy will sign off as dosage adjustment likely unnecessary, will follow peripherally.  Thank you for the consult!    Naziah Portee D. Mina Marble, PharmD, BCPS Pager:  [phone] 01/25/2015, 7:48 PM

## 2015-01-25 NOTE — ED Notes (Addendum)
Catheterization attempted via coude catheter by RN Gabriel Cirri, then by MD [facility] without success

## 2015-01-25 NOTE — ED Notes (Signed)
Pt unable to void, bladder scan performed, pt noted to have greater than 319mls in bladder.  Dr Audie Pinto notified

## 2015-01-25 NOTE — ED Notes (Signed)
Assessed foley catheter for urinary output, pt noted to have blood draining from meatus around catheter tubing.  Balloon deflated, catheter removed.  Frank blood noted to be draining from urethra.  MD Audie Pinto notified.

## 2015-01-26 ENCOUNTER — Encounter (HOSPITAL_COMMUNITY): Payer: Self-pay | Admitting: *Deleted

## 2015-01-26 DIAGNOSIS — M353 Polymyalgia rheumatica: Secondary | ICD-10-CM

## 2015-01-26 DIAGNOSIS — E860 Dehydration: Secondary | ICD-10-CM

## 2015-01-26 LAB — COMPREHENSIVE METABOLIC PANEL
ALT: 12 U/L (ref 0–53)
AST: 16 U/L (ref 0–37)
Albumin: 2.1 g/dL — ABNORMAL LOW (ref 3.5–5.2)
Alkaline Phosphatase: 59 U/L (ref 39–117)
Anion gap: 7 (ref 5–15)
BUN: 19 mg/dL (ref 6–23)
CALCIUM: 8.2 mg/dL — AB (ref 8.4–10.5)
CO2: 26 mmol/L (ref 19–32)
Chloride: 105 mmol/L (ref 96–112)
Creatinine, Ser: 1.46 mg/dL — ABNORMAL HIGH (ref 0.50–1.35)
GFR calc Af Amer: 46 mL/min — ABNORMAL LOW (ref >90)
GFR calc non Af Amer: 40 mL/min — ABNORMAL LOW (ref >90)
Glucose, Bld: 105 mg/dL — ABNORMAL HIGH (ref 70–99)
Potassium: 3.9 mmol/L (ref 3.5–5.1)
SODIUM: 138 mmol/L (ref 135–145)
Total Bilirubin: 0.8 mg/dL (ref 0.3–1.2)
Total Protein: 4.9 g/dL — ABNORMAL LOW (ref 6.0–8.3)

## 2015-01-26 LAB — CBC WITH DIFFERENTIAL/PLATELET
Basophils Absolute: 0 10*3/uL (ref 0.0–0.1)
Basophils Relative: 0 % (ref 0–1)
EOS ABS: 0.1 10*3/uL (ref 0.0–0.7)
Eosinophils Relative: 1 % (ref 0–5)
HCT: 36.5 % — ABNORMAL LOW (ref 39.0–52.0)
HEMOGLOBIN: 12 g/dL — AB (ref 13.0–17.0)
LYMPHS ABS: 1.8 10*3/uL (ref 0.7–4.0)
LYMPHS PCT: 14 % (ref 12–46)
MCH: 33 pg (ref 26.0–34.0)
MCHC: 32.9 g/dL (ref 30.0–36.0)
MCV: 100.3 fL — ABNORMAL HIGH (ref 78.0–100.0)
MONOS PCT: 6 % (ref 3–12)
Monocytes Absolute: 0.7 10*3/uL (ref 0.1–1.0)
NEUTROS PCT: 79 % — AB (ref 43–77)
Neutro Abs: 10.2 10*3/uL — ABNORMAL HIGH (ref 1.7–7.7)
PLATELETS: 196 10*3/uL (ref 150–400)
RBC: 3.64 MIL/uL — AB (ref 4.22–5.81)
RDW: 14.3 % (ref 11.5–15.5)
WBC: 12.8 10*3/uL — AB (ref 4.0–10.5)

## 2015-01-26 LAB — INFLUENZA PANEL BY PCR (TYPE A & B)
H1N1FLUPCR: NOT DETECTED
INFLBPCR: NEGATIVE
Influenza A By PCR: NEGATIVE

## 2015-01-26 LAB — GLUCOSE, CAPILLARY: Glucose-Capillary: 155 mg/dL — ABNORMAL HIGH (ref 70–99)

## 2015-01-26 LAB — HIV ANTIBODY (ROUTINE TESTING W REFLEX): HIV Screen 4th Generation wRfx: NONREACTIVE

## 2015-01-26 LAB — STREP PNEUMONIAE URINARY ANTIGEN: STREP PNEUMO URINARY ANTIGEN: NEGATIVE

## 2015-01-26 MED ORDER — CETYLPYRIDINIUM CHLORIDE 0.05 % MT LIQD
7.0000 mL | Freq: Two times a day (BID) | OROMUCOSAL | Status: DC
  Administered 2015-01-26 – 2015-01-30 (×9): 7 mL via OROMUCOSAL

## 2015-01-26 MED ORDER — AMLODIPINE BESYLATE 2.5 MG PO TABS
2.5000 mg | ORAL_TABLET | Freq: Every day | ORAL | Status: DC
  Administered 2015-01-26 – 2015-01-30 (×5): 2.5 mg via ORAL
  Filled 2015-01-26 (×5): qty 1

## 2015-01-26 NOTE — Progress Notes (Addendum)
1315: RN called by nurse tech for patient being lethargic and concern for swallow safety at lunch. RN came to bedside. Pt lethargic, responding to pain, but not voice. PEERLA. Vital signs stable. Charge RN called to come assess and patient in same state. Patient then takes large gasp, became alert and when asked if he remembered he stated no. Dr. Wynelle Cleveland made aware. No new orders received. Patient alert at baseline currently with family at bedside. Family at bedside said "We have not seen him like this before and it is easy to wake him normally".   4:29 PM Patient had another episode of lethargy and being unresponsive to voice and pain. VS stable. CBG 155. During episode patient sounded like he is snoring. Did not respond to sternal rubbing. Cold cloth placed on forehead in attempt to wake up. Episode continued for 15 minutes with Charge RN and Rapid RN notified and asked to assess. Patient once again gasped and became alert. Patient then laughed saying once again he didn't remember and "I must be hard to wake". Patient denied any distress and history of seizures. Rapid RN updated.   6:30 PM Patient continues to have episodes of not being able to arouse to voice or pain. This has been the longest episode of greater than one hour. Resp 24 Sp02 93-94 BP 110/53 HR 79 Temp 97.5. Dr. Wynelle Cleveland updated via page. Awaiting orders. 6:40 PM Dr. Wynelle Cleveland called back, no new orders received. Will continue to monitor patient and updated oncoming RN.

## 2015-01-26 NOTE — Progress Notes (Signed)
Only able to obtain patient's lying and sitting orthostatic vitals. Patient unable to stand up. Per patient he is bedridden at home. Dr. Wynelle Cleveland made aware. Patient has order for PT and OT evaluation.

## 2015-01-26 NOTE — Progress Notes (Signed)
[name] Progress Note   Barry Wright HYQ:[id number] DOB: Jan 16, 1920 DOA: 01/25/2015 PCP: Mathews Argyle, MD  Brief narrative: Barry Wright is a 81 y.o. male with history of CAD, polymyalgia rheumatica on chronic prednisone, chronic kidney disease stage III, dyslipidemia, hypothyroidism and chronic diastolic heart failure. Patient resides at home with his wife but is non-ambulatory due to issues with his lower extremities. Patient and wife have caretaker who assists patient out of bed to the chair at home. Patient was sent to the ER by the caretaker, she found the patient febrile no the morning of admission- in the ER temp was 100.3. His main complaint was generalized weakness. No other symptoms. CXR revealed LLL infiltrate.   Subjective: Continues to state he feels very weak. No other complaints.   Assessment/Plan: Principal Problem:   CAP (community acquired pneumonia)/ leukocytosis/ fever- sepsis  - given Rocephin and Zithromax in ER- then started on levofloxacin due to h/o PCN allergy- did not have allergic reaction to Rocephin -- leukocytosis improving - strep antigen negative  Active Problems: Hematuria -due to trauma from foley- mild    Chronic kidney disease, stage III --  Dehydration - Cr ranges from 1.2- 1.4 at baseline - cont IVF    Polymyalgia rheumatica - cont low dose Prednisone    Coronary artery disease - cont ASA    Essential hypertension, benign - BP elevated- resume Norvasc    Hypothyroidism - cont Synthroid   Appt with PCP: requested Code Status: full code Family Communication:  Disposition Plan: home in 1-2 days DVT prophylaxis: heparin Consultants: Procedures:  Antibiotics: Anti-infectives    Start     Dose/Rate Route Frequency Ordered Stop   01/25/15 2100  levofloxacin (LEVAQUIN) IVPB 750 mg     750 mg 100 mL/hr over 90 Minutes Intravenous Every 48 hours 01/25/15 1946 01/31/15 2059   01/25/15 1915  levofloxacin  (LEVAQUIN) IVPB 750 mg  Status:  Discontinued     750 mg 100 mL/hr over 90 Minutes Intravenous Every 24 hours 01/25/15 1908 01/25/15 1946   01/25/15 1245  cefTRIAXone (ROCEPHIN) 1 g in dextrose 5 % 50 mL IVPB     1 g 100 mL/hr over 30 Minutes Intravenous  Once 01/25/15 1239 01/25/15 1404   01/25/15 1245  azithromycin (ZITHROMAX) 500 mg in dextrose 5 % 250 mL IVPB     500 mg 250 mL/hr over 60 Minutes Intravenous  Once 01/25/15 1239 01/25/15 1513      Objective: Filed Weights   01/25/15 1940  Weight: 81.693 kg (180 lb 1.6 oz)    Intake/Output Summary (Last 24 hours) at 01/26/15 1116 Last data filed at 01/26/15 1033  Gross per 24 hour  Intake    240 ml  Output   1425 ml  Net  -1185 ml     Vitals Filed Vitals:   01/26/15 0554 01/26/15 0837 01/26/15 0851 01/26/15 1112  BP: 123/60 138/61 127/61 154/67  Pulse: 84 83    Temp: 97.8 F (36.6 C) 98.3 F (36.8 C)    TempSrc: Oral Oral    Resp: 20 14 16    Height:      Weight:      SpO2: 94% 95% 93%     Exam:  General:  Pt is alert, not in acute distress  HEENT: No icterus, No thrush  Cardiovascular: regular rate and rhythm, S1/S2 No murmur  Respiratory: clear to auscultation bilaterally   Abdomen: Soft, +Bowel sounds, non tender, non distended, no guarding  MSK: No  LE edema, cyanosis or clubbing  Data Reviewed: Basic Metabolic Panel:  Recent Labs Lab 01/25/15 1108 01/26/15 0620  NA 138 138  K 3.6 3.9  CL 104 105  CO2 22 26  GLUCOSE 129* 105*  BUN 23 19  CREATININE 1.28 1.46*  CALCIUM 8.8 8.2*   Liver Function Tests:  Recent Labs Lab 01/26/15 0620  AST 16  ALT 12  ALKPHOS 59  BILITOT 0.8  PROT 4.9*  ALBUMIN 2.1*   No results for input(s): LIPASE, AMYLASE in the last 168 hours. No results for input(s): AMMONIA in the last 168 hours. CBC:  Recent Labs Lab 01/25/15 1108 01/26/15 0620  WBC 13.6* 12.8*  NEUTROABS 10.5* 10.2*  HGB 13.3 12.0*  HCT 39.7 36.5*  MCV 99.7 100.3*  PLT 186 196    Cardiac Enzymes: No results for input(s): CKTOTAL, CKMB, CKMBINDEX, TROPONINI in the last 168 hours. BNP (last 3 results) No results for input(s): BNP in the last 8760 hours.  ProBNP (last 3 results) No results for input(s): PROBNP in the last 8760 hours.  CBG: No results for input(s): GLUCAP in the last 168 hours.  Recent Results (from the past 240 hour(s))  Culture, blood (routine x 2)     Status: None (Preliminary result)   Collection Time: 01/25/15 12:29 PM  Result Value Ref Range Status   Specimen Description BLOOD BLOOD RIGHT FOREARM  Final   Special Requests BOTTLES DRAWN AEROBIC AND ANAEROBIC 10CC  Final   Culture   Final           BLOOD CULTURE RECEIVED NO GROWTH TO DATE CULTURE WILL BE HELD FOR 5 DAYS BEFORE ISSUING A FINAL NEGATIVE REPORT Performed at [facility]    Report Status PENDING  Incomplete  Culture, blood (routine x 2)     Status: None (Preliminary result)   Collection Time: 01/25/15 12:34 PM  Result Value Ref Range Status   Specimen Description BLOOD HAND RIGHT  Final   Special Requests BOTTLES DRAWN AEROBIC AND ANAEROBIC 10CC  Final   Culture   Final           BLOOD CULTURE RECEIVED NO GROWTH TO DATE CULTURE WILL BE HELD FOR 5 DAYS BEFORE ISSUING A FINAL NEGATIVE REPORT Performed at [facility]    Report Status PENDING  Incomplete     Studies:  Recent x-ray studies have been reviewed in detail by the Attending Physician  Scheduled Meds:  Scheduled Meds: . antiseptic oral rinse  7 mL Mouth Rinse BID  . aspirin EC  81 mg Oral Daily  . atorvastatin  20 mg Oral q morning - 10a  . calcium carbonate  1 tablet Oral Q breakfast  . clopidogrel  75 mg Oral Daily  . doxazosin  4 mg Oral Daily  . feeding supplement (RESOURCE BREEZE)  1 Container Oral TID BM  . folic acid  1 mg Oral Daily  . gabapentin  300 mg Oral TID  . heparin  5,000 Units Subcutaneous 3 times per day  . lactose free nutrition  237 mL Oral TID WC  . levofloxacin  (LEVAQUIN) IV  750 mg Intravenous Q48H  . levothyroxine  75 mcg Oral Daily  . loratadine  10 mg Oral Daily  . multivitamin with minerals  1 tablet Oral Daily  . pantoprazole  40 mg Oral Daily  . polyethylene glycol  17 g Oral Daily  . predniSONE  10 mg Oral Q breakfast   Continuous Infusions: . sodium chloride 100  mL/hr at 01/26/15 0747    Time spent on care of this patient: 72min   Alixandria Friedt, MD 01/26/2015, 11:16 AM  LOS: 1 day   [name] Office  [phone] Pager - Text Page per [email or web address]  If 7PM-7AM, please contact night-coverage [email or web address]

## 2015-01-26 NOTE — Evaluation (Signed)
Clinical/Bedside Swallow Evaluation Patient Details  Name: Barry Wright MRN: [id number] Date of Birth: 1920/07/07  Today's Date: 01/26/2015 Time: SLP Start Time (ACUTE ONLY): 4098 SLP Stop Time (ACUTE ONLY): 0914 SLP Time Calculation (min) (ACUTE ONLY): 10 min  Past Medical History:  Past Medical History  Diagnosis Date  . Chronic back pain   . Hypercholesteremia   . Hypothyroidism   . CAD (coronary artery disease)   . Chronic kidney disease (CKD), stage III (moderate)   . Polymyalgia rheumatica   . Left bundle branch block   . Hx of CABG   . History of nephrectomy   . Internal carotid artery stenosis   . Myocardial infarction   . CHF (congestive heart failure)   . Stroke    Past Surgical History:  Past Surgical History  Procedure Laterality Date  . Coronary artery bypass graft    . Cholecystectomy    . Kidney surgery      kidney removed   HPI:  Barry Wright adm to [facility] with weakness, chronic kidney dx - urethra trauma.  Pt with PMH + for CVA, kidney dx, pna, CABG, smoker, polyrheumatica and is nonambulatory at baseline.     Assessment / Plan / Recommendation Clinical Impression  Pt presents with mild oral dysphagia primarily due to dentition, xerostomia and suspect mild weakness.  No s/s of aspiration with intake observed - eggs, prune juice, sausage and coffee.  Pt denies dysphagia = admits to issues with constipation currently.  Recommend contiue regular/thin diet with aspiration precautions.      Aspiration Risk  Mild    Diet Recommendation Regular;Thin liquid   Liquid Administration via: Cup;Straw Medication Administration: Whole meds with liquid Supervision: Patient able to self feed Compensations: Slow rate;Small sips/bites;Check for pocketing    Other  Recommendations Oral Care Recommendations: Oral care BID   Follow Up Recommendations  None    Frequency and Duration        Pertinent Vitals/Pain Low grade temp     Swallow Study Prior Functional  Status   see hhx    General Date of Onset: 01/26/15 HPI: Barry Wright adm to [facility] with weakness, chronic kidney dx - urethra trauma.  Pt with PMH + for CVA, kidney dx, pna, CABG, smoker, polyrheumatica and is nonambulatory at baseline.   Type of Study: Bedside swallow evaluation Diet Prior to this Study: Regular;Thin liquids Temperature Spikes Noted: No Respiratory Status: Room air Behavior/Cognition: Alert;Cooperative;Pleasant mood Oral Cavity - Dentition: Adequate natural dentition (some dentition poor condition ) Self-Feeding Abilities: Able to feed self Patient Positioning: Partially reclined (pt states his back hurts when sitting upright) Baseline Vocal Quality: Clear Volitional Cough: Other (Comment) (dnt as pt indicated need to have a bowel movement) Volitional Swallow: Able to elicit    Oral/Motor/Sensory Function Overall Oral Motor/Sensory Function: Appears within functional limits for tasks assessed   Ice Chips Ice chips: Not tested   Thin Liquid Thin Liquid: Within functional limits Presentation: Cup;Self Fed;Straw    Nectar Thick Nectar Thick Liquid: Not tested   Honey Thick Honey Thick Liquid: Not tested   Puree Puree: Not tested   Solid   GO    Solid: Impaired Presentation: Self Fed;Spoon Oral Phase Functional Implications: Oral residue Other Comments: slow mastication but effective, minimal residuals cleared with liquids       Barry Wright, [facility] SLP [phone]

## 2015-01-26 NOTE — Code Documentation (Signed)
Called to see patient as" second set of eyes" due to lethargy.  RN Apolonio Schneiders reports patient has experienced several episodes of difficulty arousing.  Although when arouses awakens quickly and is appropriate.  No neuro focal deficits noted when awakened.   Dr. Wynelle Cleveland aware and examined patient with first episode.  On my arrival patient is supine in bed - warm and dry - pale - resps reg and unlabored - bil BS with few rhonchi - BP 118/58 HR 71 RR 22 - O2 sats 97% on 2 liter n/c.  Patient arouses to loud voice and shaking - some noxious stimulus to chest - immediately awake - follows commands - speech clear - oriented - moving everything.  Interacting with family and staff.  Is easily somulent without stimulation -will arouse to voice at times - wife and caretaker at bedside - he does sleep at home but seems to arouse more easily there.  Dr. Wynelle Cleveland notified by Apolonio Schneiders RN - no interventions - continue to watch per order.  Handoff to Apolonio Schneiders - to call as needed.

## 2015-01-27 LAB — URINE CULTURE
CULTURE: NO GROWTH
Colony Count: NO GROWTH
SPECIAL REQUESTS: NORMAL

## 2015-01-27 LAB — BASIC METABOLIC PANEL
Anion gap: 7 (ref 5–15)
BUN: 19 mg/dL (ref 6–23)
CALCIUM: 7.7 mg/dL — AB (ref 8.4–10.5)
CHLORIDE: 110 mmol/L (ref 96–112)
CO2: 21 mmol/L (ref 19–32)
Creatinine, Ser: 1.46 mg/dL — ABNORMAL HIGH (ref 0.50–1.35)
GFR calc non Af Amer: 40 mL/min — ABNORMAL LOW (ref >90)
GFR, EST AFRICAN AMERICAN: 46 mL/min — AB (ref >90)
GLUCOSE: 207 mg/dL — AB (ref 70–99)
Potassium: 3.5 mmol/L (ref 3.5–5.1)
Sodium: 138 mmol/L (ref 135–145)

## 2015-01-27 MED ORDER — LEVALBUTEROL HCL 1.25 MG/0.5ML IN NEBU
1.2500 mg | INHALATION_SOLUTION | Freq: Four times a day (QID) | RESPIRATORY_TRACT | Status: DC | PRN
  Filled 2015-01-27: qty 0.5

## 2015-01-27 NOTE — Progress Notes (Addendum)
Patient ID: Barry Wright, male   DOB: [birth year], 81 y.o.   MRN: [id number]  [name] PROGRESS NOTE  JHAN CONERY ANV:[id number] DOB: [birth year] DOA: 01/25/2015 PCP: Mathews Argyle, MD   Brief narrative:    81 y.o. male with history of CAD, polymyalgia rheumatica on chronic prednisone, chronic kidney disease stage III, dyslipidemia, hypothyroidism and chronic diastolic heart failure. Patient resides at home with his wife but is non-ambulatory due to issues with his lower extremities. Patient and wife have caretaker who assists patient out of bed to the chair at home. Patient was sent to the ER by the caretaker, she found patient febrile on the morning of admission.  In the ED, pt had T 100.3 F and CXR revealed LLL infiltrate.  Assessment/Plan:    Principal Problem:   Sepsis secondary to lobar PNA, LLL - criteria for sepsis met on admission with fever, tachycardia, RR 24, leukocytosis - source LLL PNA - respiratory status stable this AM - continue Levaquin - continue supportive care with BD's as needed  - WBC trending down, repeat CBC in AM   CAP (community acquired pneumonia), LLL, unknown bacterial pathogen  - ABX as noted above and monitor respiratory status  Active Problems:   Chronic kidney disease (CKD), stage III (moderate) - Cr slightly up over the past 48 hours - readjust the frequency of Levaquin based on GFR  - repeat BMP in AM - encourage PO intake    Polymyalgia rheumatica - stable, continue home regimen with Prednisone    Coronary artery disease - continue Aspirin, Plavix, Statin    Essential hypertension, benign - reasonable inpatient control - continue Norvasc    Hypothyroidism - continue synthroid    Protein-calorie malnutrition, moderate - in the context of acute illness - tolerating diet well    Chronic diastolic and systolic heart failure - last 2 D ECHO in 07/2014 with EF 45 - 59% and diastolic dysfunction - weight is 81.69 kg this  AM - continue to monitor daily weights, strict I's and O's - stop IVF to avoid vascular congestion - repeat CXR in AM  DVT prophylaxis - Heparin SQ  Code Status: Full.  Family Communication:  plan of care discussed with the patient Disposition Plan: Home when stable.   IV access:  Peripheral IV  Procedures and diagnostic studies:    Dg Chest 2 View  01/25/2015  Findings suggest left lower lobe infiltrate with small associated pleural effusion concerning for pneumonia. There is also a nodular density over the left lower lobe for which CT thorax is suggested.     Ct Chest Wo Contrast  01/25/2015 Left lower lobe infiltrate with associated small effusion.  Left lingular infiltrate which corresponds to the nodular changes on the recent chest x-ray. No parenchymal nodules are seen.   Medical Consultants:  None   Other Consultants:  None   IAnti-Infectives:   Levaquin 3/17 -->  Faye Ramsay, MD  [facility] Pager [phone]  If 7PM-7AM, please contact night-coverage [email or web address] Password [facility] 01/27/2015, 11:56 AM   LOS: 2 days   HPI/Subjective: No events overnight.   Objective: Filed Vitals:   01/26/15 1826 01/26/15 1832 01/26/15 2122 01/27/15 0648  BP: 110/53  113/56 133/52  Pulse: 77  77   Temp: 97.5 F (36.4 C)  98.1 F (36.7 C) 98.4 F (36.9 C)  TempSrc: Oral  Oral Oral  Resp: _0 Height:      Weight:      SpO2: 93% 95%  92% 94%    Intake/Output Summary (Last 24 hours) at 01/27/15 1156 Last data filed at 01/27/15 0946  Gross per 24 hour  Intake 1701.67 ml  Output   2100 ml  Net -398.33 ml    Exam:   General:  Pt is alert, follows commands appropriately, not in acute distress  Cardiovascular: Regular rate and rhythm, no rubs, no gallops  Respiratory: Clear to auscultation bilaterally, rhonchi at bases   Abdomen: Soft, non tender, non distended, bowel sounds present, no guarding  Extremities: pulses DP and PT palpable bilaterally  Neuro:  Grossly nonfocal  Data Reviewed: Basic Metabolic Panel:  Recent Labs Lab 01/25/15 1108 01/26/15 0620  NA 138 138  K 3.6 3.9  CL 104 105  CO2 22 26  GLUCOSE 129* 105*  BUN 23 19  CREATININE 1.28 1.46*  CALCIUM 8.8 8.2*   Liver Function Tests:  Recent Labs Lab 01/26/15 0620  AST 16  ALT 12  ALKPHOS 59  BILITOT 0.8  PROT 4.9*  ALBUMIN 2.1*   CBC:  Recent Labs Lab 01/25/15 1108 01/26/15 0620  WBC 13.6* 12.8*  NEUTROABS 10.5* 10.2*  HGB 13.3 12.0*  HCT 39.7 36.5*  MCV 99.7 100.3*  PLT 186 196   CBG:  Recent Labs Lab 01/26/15 1623  GLUCAP 155*    Recent Results (from the past 240 hour(s))  Culture, blood (routine x 2)     Status: None (Preliminary result)   Collection Time: 01/25/15 12:29 PM  Result Value Ref Range Status   Specimen Description BLOOD BLOOD RIGHT FOREARM  Final   Special Requests BOTTLES DRAWN AEROBIC AND ANAEROBIC 10CC  Final   Culture   Final           BLOOD CULTURE RECEIVED NO GROWTH TO DATE CULTURE WILL BE HELD FOR 5 DAYS BEFORE ISSUING A FINAL NEGATIVE REPORT Performed at [facility]    Report Status PENDING  Incomplete  Culture, blood (routine x 2)     Status: None (Preliminary result)   Collection Time: 01/25/15 12:34 PM  Result Value Ref Range Status   Specimen Description BLOOD HAND RIGHT  Final   Special Requests BOTTLES DRAWN AEROBIC AND ANAEROBIC 10CC  Final   Culture   Final           BLOOD CULTURE RECEIVED NO GROWTH TO DATE CULTURE WILL BE HELD FOR 5 DAYS BEFORE ISSUING A FINAL NEGATIVE REPORT Performed at [facility]    Report Status PENDING  Incomplete  Urine culture     Status: None   Collection Time: 01/25/15  6:34 PM  Result Value Ref Range Status   Specimen Description URINE, RANDOM  Final   Special Requests Normal  Final   Colony Count NO GROWTH Performed at [facility]   Final   Culture NO GROWTH Performed at [facility]   Final   Report Status 01/27/2015 FINAL  Final      Scheduled Meds: . amLODipine  2.5 mg Oral Daily  . aspirin EC  81 mg Oral Daily  . atorvastatin  20 mg Oral q morning - 10a  . calcium carbonate  1 tablet Oral Q breakfast  . clopidogrel  75 mg Oral Daily  . doxazosin  4 mg Oral Daily  . folic acid  1 mg Oral Daily  . gabapentin  300 mg Oral TID  . heparin  5,000 Units Subcutaneous 3 times per day  . levofloxacin  IV  750 mg Intravenous Q48H  .  levothyroxine  75 mcg Oral Daily  . loratadine  10 mg Oral Daily  . pantoprazole  40 mg Oral Daily  . polyethylene glycol  17 g Oral Daily  . predniSONE  10 mg Oral Q breakfast   Continuous Infusions: . sodium chloride 100 mL/hr at 01/26/15 2218          

## 2015-01-27 NOTE — Evaluation (Signed)
Physical Therapy Evaluation Patient Details Name: Barry Wright MRN: [id number] DOB: [birth year] Today's Date: 01/27/2015   History of Present Illness  Barry Wright is a 81 y.o. male with history of CAD, polymyalgia rheumatica on chronic prednisone, chronic kidney disease stage III, dyslipidemia, hypothyroidism and chronic diastolic heart failure. Patient resides at home with his wife but is non-ambulatory due to issues with his lower extremities. Patient and wife have caretaker who assists patient out of bed to the chair at home. Patient was sent to the ER by the caretaker, she found the patient febrile no the morning of admission- in the ER temp was 100.3. His main complaint was generalized weakness. No other symptoms. CXR revealed LLL infiltrate.   Clinical Impression  Pt admitted with the above complications. Pt currently with functional limitations due to the deficits listed below (see PT Problem List). Tolerated transfer training well with min assist and cues for safety/technique. SpO2 92-94% on room air throughout session. Currently has an aide that assists at home and per wife, who observed PT evaluation, states Mr. Spray is near his baseline of mobility . Pt would greatly benefit from HHPT services and a wheelchair for mobility. Pt will benefit from skilled PT to increase their independence and safety with mobility to allow discharge to the venue listed below.       Follow Up Recommendations Home health PT    Equipment Recommendations  Wheelchair (measurements PT);Wheelchair cushion (measurements PT)    Recommendations for Other Services OT consult     Precautions / Restrictions Precautions Precautions: Fall Restrictions Weight Bearing Restrictions: No Other Position/Activity Restrictions: Pt with limited mobility, maily stand pivot transfers at baseline.      Mobility  Bed Mobility Overal bed mobility: Needs Assistance Bed Mobility: Supine to Sit     Supine to sit: Min  assist;HOB elevated     General bed mobility comments: Min assist initially due to lethargy for bringing LEs off of bed, however as pt became more alert was able to pull himself into a seated position using rails.  Transfers Overall transfer level: Needs assistance Equipment used: Rolling walker (2 wheeled) Transfers: Sit to/from [facility] Sit to Stand: Min assist Stand pivot transfers: Min assist       General transfer comment: Min assist for boost to stand x2 from lowest bed setting. Needed to sit shortly after standing first trial due to back pain. VC for hand placement to rise from bed. Min assist for walker placement with pivot transfer from bed to chair. Good control with descent into chair. Unable to tolerate upright stance due to spinal stenosis; pt reports increased pain.  Ambulation/Gait                Stairs            Wheelchair Mobility    Modified Rankin (Stroke Patients Only)       Balance Overall balance assessment: Needs assistance Sitting-balance support: No upper extremity supported;Feet supported Sitting balance-Leahy Scale: Fair     Standing balance support: Bilateral upper extremity supported Standing balance-Leahy Scale: Poor                               Pertinent Vitals/Pain Pain Assessment: No/denies pain (States he has pain when standing straight due to stenosis)    Home Living Family/patient expects to be discharged to:: Private residence Living Arrangements: Spouse/significant other Available Help at Discharge: Personal care  attendant Type of Home: House Home Access: Ramped entrance     Home Layout: Two level;Able to live on main level with bedroom/bathroom Home Equipment: Gilford Rile - 2 wheels;Bedside commode;Shower seat;Grab bars - toilet;Grab bars - tub/shower Additional Comments: aide helps with househould tasks, lays out clothes, etc.; able to assist with basic ADLs if needed    Prior Function  Level of Independence: Needs assistance   Gait / Transfers Assistance Needed: limited to stand pivot transfers with RW at baseline  ADL's / Homemaking Assistance Needed: aide helps with household tasks, setup for ADLs        Hand Dominance   Dominant Hand: Right    Extremity/Trunk Assessment   Upper Extremity Assessment: Defer to OT evaluation           Lower Extremity Assessment: Generalized weakness         Communication   Communication: HOH  Cognition Arousal/Alertness: Awake/alert Behavior During Therapy: WFL for tasks assessed/performed Overall Cognitive Status: Within Functional Limits for tasks assessed                      General Comments General comments (skin integrity, edema, etc.): SpO2 92-94% on Room air, HR 97    Exercises General Exercises - Lower Extremity Ankle Circles/Pumps: AROM;Both;10 reps;Seated Quad Sets: Strengthening;Both;10 reps;Seated Gluteal Sets: Strengthening;Both;5 reps;Seated      Assessment/Plan    PT Assessment Patient needs continued PT services  PT Diagnosis Difficulty walking;Generalized weakness   PT Problem List Decreased range of motion;Decreased strength;Decreased activity tolerance;Decreased balance;Decreased mobility;Decreased knowledge of use of DME;Pain  PT Treatment Interventions DME instruction;Gait training;Functional mobility training;Therapeutic exercise;Therapeutic activities;Balance training;Neuromuscular re-education;Patient/family education;Modalities;Wheelchair mobility training   PT Goals (Current goals can be found in the Care Plan section) Acute Rehab PT Goals Patient Stated Goal: Go out to dinner PT Goal Formulation: With patient Time For Goal Achievement: 02/10/15 Potential to Achieve Goals: Fair    Frequency Min 3X/week   Barriers to discharge        Co-evaluation               End of Session Equipment Utilized During Treatment: Gait belt Activity Tolerance: Patient limited  by pain Patient left: in chair;with call bell/phone within reach;with family/visitor present Nurse Communication: Mobility status;Precautions         Time: 6979-4801 PT Time Calculation (min) (ACUTE ONLY): 36 min   Charges:   PT Evaluation $Initial PT Evaluation Tier I: 1 Procedure PT Treatments $Therapeutic Activity: 8-22 mins   PT G CodesEllouise Newer 01/27/2015, 1:59 PM  Camille Bal [place], [place]

## 2015-01-28 ENCOUNTER — Inpatient Hospital Stay (HOSPITAL_COMMUNITY): Payer: Medicare Other

## 2015-01-28 LAB — BASIC METABOLIC PANEL
Anion gap: 5 (ref 5–15)
BUN: 16 mg/dL (ref 6–23)
CHLORIDE: 113 mmol/L — AB (ref 96–112)
CO2: 22 mmol/L (ref 19–32)
CREATININE: 1.39 mg/dL — AB (ref 0.50–1.35)
Calcium: 7.8 mg/dL — ABNORMAL LOW (ref 8.4–10.5)
GFR calc Af Amer: 49 mL/min — ABNORMAL LOW (ref >90)
GFR calc non Af Amer: 42 mL/min — ABNORMAL LOW (ref >90)
Glucose, Bld: 99 mg/dL (ref 70–99)
Potassium: 3.4 mmol/L — ABNORMAL LOW (ref 3.5–5.1)
Sodium: 140 mmol/L (ref 135–145)

## 2015-01-28 LAB — CBC
HCT: 31.3 % — ABNORMAL LOW (ref 39.0–52.0)
HEMOGLOBIN: 10.3 g/dL — AB (ref 13.0–17.0)
MCH: 33 pg (ref 26.0–34.0)
MCHC: 32.9 g/dL (ref 30.0–36.0)
MCV: 100.3 fL — AB (ref 78.0–100.0)
Platelets: 204 10*3/uL (ref 150–400)
RBC: 3.12 MIL/uL — AB (ref 4.22–5.81)
RDW: 14.3 % (ref 11.5–15.5)
WBC: 8.5 10*3/uL (ref 4.0–10.5)

## 2015-01-28 MED ORDER — POTASSIUM CHLORIDE CRYS ER 20 MEQ PO TBCR
40.0000 meq | EXTENDED_RELEASE_TABLET | Freq: Once | ORAL | Status: AC
  Administered 2015-01-28: 40 meq via ORAL
  Filled 2015-01-28: qty 2

## 2015-01-28 MED ORDER — FUROSEMIDE 20 MG PO TABS
20.0000 mg | ORAL_TABLET | Freq: Every day | ORAL | Status: DC
  Administered 2015-01-28 – 2015-01-30 (×3): 20 mg via ORAL
  Filled 2015-01-28 (×3): qty 1

## 2015-01-28 MED ORDER — GUAIFENESIN ER 600 MG PO TB12
600.0000 mg | ORAL_TABLET | Freq: Two times a day (BID) | ORAL | Status: DC
  Administered 2015-01-28 – 2015-01-30 (×5): 600 mg via ORAL
  Filled 2015-01-28 (×7): qty 1

## 2015-01-28 MED ORDER — LEVOFLOXACIN 750 MG PO TABS
750.0000 mg | ORAL_TABLET | ORAL | Status: DC
  Administered 2015-01-29: 750 mg via ORAL
  Filled 2015-01-28: qty 1

## 2015-01-28 NOTE — Progress Notes (Signed)
Patient ID: Barry Wright, male   DOB: [birth year], 81 y.o.   MRN: [id number]  [name] PROGRESS NOTE  Barry Wright [id number] DOB: 1919-12-08 DOA: 01/25/2015 PCP: Mathews Argyle, MD   Brief narrative:    81 y.o. male with history of CAD, polymyalgia rheumatica on chronic prednisone, chronic kidney disease stage III, dyslipidemia, hypothyroidism and chronic diastolic heart failure. Patient resides at home with his wife but is non-ambulatory due to issues with his lower extremities. Patient and wife have caretaker who assists patient out of bed to the chair at home. Patient was sent to the ER by the caretaker, she found patient febrile on the morning of admission.  In the ED, pt had T 100.3 F and CXR revealed LLL infiltrate.  Assessment/Plan:    Principal Problem:   Sepsis secondary to lobar PNA, LLL - criteria for sepsis met on admission with fever, tachycardia, RR 24, leukocytosis - source LLL PNA - respiratory status stable this AM - continue Levaquin - continue supportive care with BD's as needed  - WBC trending down and is WNL this AM   CAP (community acquired pneumonia), LLL, unknown bacterial pathogen  - ABX as noted above and monitor respiratory status  Active Problems:   Chronic kidney disease (CKD), stage III (moderate) - Cr trending down  - readjust the frequency of Levaquin based on GFR  - repeat BMP in AM - encourage PO intake    Hypokalemia - mild, supplement today and repeat BMP in AM   Polymyalgia rheumatica - stable, continue home regimen with Prednisone    Anemia of chronic disease, PMR - drop in Hg since admission - possibly dilutional component from IVF pt has received  - no signs of active bleeding - repeat CBC in AM   Coronary artery disease - continue Aspirin, Plavix, Statin    Essential hypertension, benign - reasonable inpatient control - continue Norvasc    Hypothyroidism - continue synthroid    Protein-calorie malnutrition,  moderate - in the context of acute illness - tolerating diet well    Chronic diastolic and systolic heart failure - last 2 D ECHO in 07/2014 with EF 45 - 09% and diastolic dysfunction - weight is 81.69 kg on admission but up to 87 kg this AM  - continue to monitor daily weights, strict I's and O's - IVF stopped 3/19, will place on low dose Lasix today 3/20 and will monitor clinical response  - repeat CXR pending   DVT prophylaxis - Heparin SQ  Code Status: Full.  Family Communication:  plan of care discussed with the patient Disposition Plan: Home when stable.   IV access:  Peripheral IV  Procedures and diagnostic studies:    Dg Chest 2 View  01/25/2015  Findings suggest left lower lobe infiltrate with small associated pleural effusion concerning for pneumonia. There is also a nodular density over the left lower lobe for which CT thorax is suggested.     Ct Chest Wo Contrast  01/25/2015 Left lower lobe infiltrate with associated small effusion.  Left lingular infiltrate which corresponds to the nodular changes on the recent chest x-ray. No parenchymal nodules are seen.   Medical Consultants:  None   Other Consultants:  None   IAnti-Infectives:   Levaquin 3/17 -->  Faye Ramsay, MD  [facility] Pager [phone]  If 7PM-7AM, please contact night-coverage [email or web address] Password [email or web address] 01/28/2015, 1:43 PM   LOS: 3 days   HPI/Subjective: No events overnight.   Objective: Filed Vitals:   01/27/15  2157 01/28/15 0537 01/28/15 0538 01/28/15 1305  BP: 132/62 142/59  138/56  Pulse:    72  Temp: 97.7 F (36.5 C) 97.8 F (36.6 C)  98.5 F (36.9 C)  TempSrc: Oral Oral  Oral  Resp: _0 Height:      Weight:   87.136 kg (192 lb 1.6 oz)   SpO2: 96% 94%  99%    Intake/Output Summary (Last 24 hours) at 01/28/15 1343 Last data filed at 01/28/15 1317  Gross per 24 hour  Intake   2177 ml  Output   2350 ml  Net   -173 ml    Exam:   General:  Pt is alert, follows  commands appropriately, not in acute distress  Cardiovascular: Regular rate and rhythm, no rubs, no gallops  Respiratory: Clear to auscultation bilaterally, persistent rhonchi at bases worse on the left side   Abdomen: Soft, non tender, non distended, bowel sounds present, no guarding  Extremities: pulses DP and PT palpable bilaterally  Neuro: Grossly nonfocal  Data Reviewed: Basic Metabolic Panel:  Recent Labs Lab 01/25/15 1108 01/26/15 0620 01/27/15 1635 01/28/15 0541  NA 138 138 138 140  K 3.6 3.9 3.5 3.4*  CL 104 105 110 113*  CO2 _1 GLUCOSE 129* 105* 207* 99  BUN _2 CREATININE 1.28 1.46* 1.46* 1.39*  CALCIUM 8.8 8.2* 7.7* 7.8*   Liver Function Tests:  Recent Labs Lab 01/26/15 0620  AST 16  ALT 12  ALKPHOS 59  BILITOT 0.8  PROT 4.9*  ALBUMIN 2.1*   CBC:  Recent Labs Lab 01/25/15 1108 01/26/15 0620 01/28/15 0541  WBC 13.6* 12.8* 8.5  NEUTROABS 10.5* 10.2*  --   HGB 13.3 12.0* 10.3*  HCT 39.7 36.5* 31.3*  MCV 99.7 100.3* 100.3*  PLT 186 196 204   CBG:  Recent Labs Lab 01/26/15 1623  GLUCAP 155*    Recent Results (from the past 240 hour(s))  Culture, blood (routine x 2)     Status: None (Preliminary result)   Collection Time: 01/25/15 12:29 PM  Result Value Ref Range Status   Specimen Description BLOOD BLOOD RIGHT FOREARM  Final   Special Requests BOTTLES DRAWN AEROBIC AND ANAEROBIC 10CC  Final   Culture   Final           BLOOD CULTURE RECEIVED NO GROWTH TO DATE CULTURE WILL BE HELD FOR 5 DAYS BEFORE ISSUING A FINAL NEGATIVE REPORT Performed at [facility]    Report Status PENDING  Incomplete  Culture, blood (routine x 2)     Status: None (Preliminary result)   Collection Time: 01/25/15 12:34 PM  Result Value Ref Range Status   Specimen Description BLOOD HAND RIGHT  Final   Special Requests BOTTLES DRAWN AEROBIC AND ANAEROBIC 10CC  Final   Culture   Final           BLOOD CULTURE RECEIVED NO GROWTH TO DATE  CULTURE WILL BE HELD FOR 5 DAYS BEFORE ISSUING A FINAL NEGATIVE REPORT Performed at [facility]    Report Status PENDING  Incomplete  Urine culture     Status: None   Collection Time: 01/25/15  6:34 PM  Result Value Ref Range Status   Specimen Description URINE, RANDOM  Final   Special Requests Normal  Final   Colony Count NO GROWTH Performed at [facility]   Final   Culture NO GROWTH Performed at [facility]  Final   Report Status 01/27/2015 FINAL  Final     Scheduled Meds: . amLODipine  2.5 mg Oral Daily  . aspirin EC  81 mg Oral Daily  . atorvastatin  20 mg Oral q morning - 10a  . calcium carbonate  1 tablet Oral Q breakfast  . clopidogrel  75 mg Oral Daily  . doxazosin  4 mg Oral Daily  . folic acid  1 mg Oral Daily  . gabapentin  300 mg Oral TID  . heparin  5,000 Units Subcutaneous 3 times per day  . levofloxacin  IV  750 mg Intravenous Q48H  . levothyroxine  75 mcg Oral Daily  . loratadine  10 mg Oral Daily  . pantoprazole  40 mg Oral Daily  . polyethylene glycol  17 g Oral Daily  . predniSONE  10 mg Oral Q breakfast   Continuous Infusions:

## 2015-01-29 LAB — CBC
HCT: 33.2 % — ABNORMAL LOW (ref 39.0–52.0)
Hemoglobin: 11.1 g/dL — ABNORMAL LOW (ref 13.0–17.0)
MCH: 33.5 pg (ref 26.0–34.0)
MCHC: 33.4 g/dL (ref 30.0–36.0)
MCV: 100.3 fL — ABNORMAL HIGH (ref 78.0–100.0)
Platelets: 209 10*3/uL (ref 150–400)
RBC: 3.31 MIL/uL — ABNORMAL LOW (ref 4.22–5.81)
RDW: 14.3 % (ref 11.5–15.5)
WBC: 8.1 10*3/uL (ref 4.0–10.5)

## 2015-01-29 LAB — BASIC METABOLIC PANEL
Anion gap: 8 (ref 5–15)
BUN: 17 mg/dL (ref 6–23)
CO2: 23 mmol/L (ref 19–32)
Calcium: 8.1 mg/dL — ABNORMAL LOW (ref 8.4–10.5)
Chloride: 111 mmol/L (ref 96–112)
Creatinine, Ser: 1.39 mg/dL — ABNORMAL HIGH (ref 0.50–1.35)
GFR calc Af Amer: 49 mL/min — ABNORMAL LOW (ref >90)
GFR calc non Af Amer: 42 mL/min — ABNORMAL LOW (ref >90)
Glucose, Bld: 94 mg/dL (ref 70–99)
Potassium: 4 mmol/L (ref 3.5–5.1)
Sodium: 142 mmol/L (ref 135–145)

## 2015-01-29 LAB — LEGIONELLA ANTIGEN, URINE

## 2015-01-29 NOTE — Progress Notes (Signed)
CARE MANAGEMENT NOTE 01/29/2015  Patient:  Barry Wright, Barry Wright   Account Number:  [id number]  Date Initiated:  01/29/2015  Documentation initiated by:  Tomi Bamberger  Subjective/Objective Assessment:   dx cap,  admit- lives with spouse,     Action/Plan:   pt eval- rec hhpt   Anticipated DC Date:  01/30/2015   Anticipated DC Plan:  [place]  CM consult      [facility] Choice  HOME HEALTH   Choice offered to / List presented to:  C-1 Patient   DME arranged  [place]      DME agency  [place] arranged  HH-1 RN  [place].   Status of service:  Completed, signed off Medicare Important Message given?  YES (If response is "NO", the following Medicare IM given date fields will be blank) Date Medicare IM given:  01/29/2015 Medicare IM given by:  Tomi Bamberger Date Additional Medicare IM given:   Additional Medicare IM given by:    Discharge Disposition:  [place]  Per UR Regulation:  Reviewed for med. necessity/level of care/duration of stay  If discussed at [place] of Stay Meetings, dates discussed:    Comments:  01/29/15 [place] RN, BSN [phone] patient chose [facility] for hhrn and hhpt and w/chair, referral made to [facility], [place] and [place] notified.  Soc will begin 24-48 hrs post dc.  W/chair brought up to patient's room.

## 2015-01-29 NOTE — Progress Notes (Signed)
NCM spoke with patient , he chose [facility] for hhpt, and w/chair, referral made to [facility], [place] and [place] notified.  Soc will begin 24-48 hrs post dc.

## 2015-01-29 NOTE — Progress Notes (Signed)
Patient ID: Barry Wright, male   DOB: 05/20/1920, 81 y.o.   MRN: [id number]  [name] PROGRESS NOTE  Barry Wright [id number]:[id number] DOB: [birth year] DOA: 01/25/2015 PCP: Mathews Argyle, MD   Brief narrative:    81 y.o. male with history of CAD, polymyalgia rheumatica on chronic prednisone, chronic kidney disease stage III, dyslipidemia, hypothyroidism and chronic diastolic heart failure. Patient resides at home with his wife but is non-ambulatory due to issues with his lower extremities. Patient and wife have caretaker who assists patient out of bed to the chair at home. Patient was sent to the ER by the caretaker, she found patient febrile on the morning of admission.  In the ED, pt had T 100.3 F and CXR revealed LLL infiltrate.  Assessment/Plan:    Principal Problem:   Sepsis secondary to lobar PNA, LLL - criteria for sepsis met on admission with fever, tachycardia, RR 24, leukocytosis - source LLL PNA - respiratory status stable this AM - continue Levaquin - continue supportive care with BD's as needed  - WBC trending down and is WNL this AM - pt remains afebrile over the past 48 hours   CAP (community acquired pneumonia), LLL, unknown bacterial pathogen  - ABX as noted above and monitor respiratory status  Active Problems:   Chronic kidney disease (CKD), stage III (moderate) - Cr remains stable at 1.39 - readjust the frequency of Levaquin based on GFR  - repeat BMP in AM - encouraged PO intake    Hypokalemia - supplemented and WNL this AM   Polymyalgia rheumatica - stable, continue home regimen with Prednisone    Anemia of chronic disease, PMR - Hg stable over the past 24 hours  - no signs of active bleeding - repeat CBC in AM   Coronary artery disease - continue Aspirin, Plavix, Statin    Essential hypertension, benign - reasonable inpatient control - continue Norvasc    Hypothyroidism - continue synthroid    Protein-calorie malnutrition, moderate -  in the context of acute illness - tolerating diet well    Chronic diastolic and systolic heart failure - last 2 D ECHO in 07/2014 with EF 45 - 05% and diastolic CHF, grade I - weight is 81.69 kg on admission but up to 87 kg 3/12 --> 86.9 kg this AM - continue to monitor daily weights, strict I's and O's - IVF stopped 3/19, placed on low dose Lasix 3/20 and will monitor clinical response   DVT prophylaxis - Heparin SQ  Code Status: Full.  Family Communication:  plan of care discussed with the patient Disposition Plan: Home in AM  IV access:  Peripheral IV  Procedures and diagnostic studies:    Dg Chest 2 View  01/25/2015  Findings suggest left lower lobe infiltrate with small associated pleural effusion concerning for pneumonia. There is also a nodular density over the left lower lobe for which CT thorax is suggested.     Ct Chest Wo Contrast  01/25/2015 Left lower lobe infiltrate with associated small effusion.  Left lingular infiltrate which corresponds to the nodular changes on the recent chest x-ray. No parenchymal nodules are seen.   Dg Chest 2 View  01/28/2015   No change from the previous exam. 2. Persistent opacity at the left lung base consistent with a combination of a small effusion in either atelectasis or infiltrate. No new lung opacities.     Medical Consultants:  None   Other Consultants:  None   IAnti-Infectives:   Levaquin 3/17 -->  Faye Ramsay, MD  [facility] Pager [phone]  If 7PM-7AM, please contact night-coverage [email or web address] Password [email or web address] 01/29/2015, 11:47 AM   LOS: 4 days   HPI/Subjective: No events overnight.   Objective: Filed Vitals:   01/28/15 0538 01/28/15 1305 01/28/15 2129 01/29/15 0456  BP:  138/56 141/92 141/68  Pulse:  72 74 68  Temp:  98.5 F (36.9 C) 97.8 F (36.6 C) 98.3 F (36.8 C)  TempSrc:  Oral Oral Oral  Resp:  14 17 19   Height:      Weight: 87.136 kg (192 lb 1.6 oz)   86.9 kg (191 lb 9.3 oz)  SpO2:  99% 96% 92%     Intake/Output Summary (Last 24 hours) at 01/29/15 1147 Last data filed at 01/29/15 1000  Gross per 24 hour  Intake    867 ml  Output   2950 ml  Net  -2083 ml    Exam:   General:  Pt is alert, follows commands appropriately, not in acute distress  Cardiovascular: Regular rate and rhythm, no rubs, no gallops  Respiratory: Clear to auscultation bilaterally, persistent rhonchi at bases worse on the left side   Abdomen: Soft, non tender, non distended, bowel sounds present, no guarding  Extremities: pulses DP and PT palpable bilaterally  Neuro: Grossly nonfocal  Data Reviewed: Basic Metabolic Panel:  Recent Labs Lab 01/25/15 1108 01/26/15 0620 01/27/15 1635 01/28/15 0541 01/29/15 0640  NA 138 138 138 140 142  K 3.6 3.9 3.5 3.4* 4.0  CL 104 105 110 113* 111  CO2 22 26 21 22 23   GLUCOSE 129* 105* 207* 99 94  BUN 23 19 19 16 17   CREATININE 1.28 1.46* 1.46* 1.39* 1.39*  CALCIUM 8.8 8.2* 7.7* 7.8* 8.1*   Liver Function Tests:  Recent Labs Lab 01/26/15 0620  AST 16  ALT 12  ALKPHOS 59  BILITOT 0.8  PROT 4.9*  ALBUMIN 2.1*   CBC:  Recent Labs Lab 01/25/15 1108 01/26/15 0620 01/28/15 0541 01/29/15 0640  WBC 13.6* 12.8* 8.5 8.1  NEUTROABS 10.5* 10.2*  --   --   HGB 13.3 12.0* 10.3* 11.1*  HCT 39.7 36.5* 31.3* 33.2*  MCV 99.7 100.3* 100.3* 100.3*  PLT 186 196 204 209   CBG:  Recent Labs Lab 01/26/15 1623  GLUCAP 155*    Recent Results (from the past 240 hour(s))  Culture, blood (routine x 2)     Status: None (Preliminary result)   Collection Time: 01/25/15 12:29 PM  Result Value Ref Range Status   Specimen Description BLOOD BLOOD RIGHT FOREARM  Final   Special Requests BOTTLES DRAWN AEROBIC AND ANAEROBIC 10CC  Final   Culture   Final           BLOOD CULTURE RECEIVED NO GROWTH TO DATE CULTURE WILL BE HELD FOR 5 DAYS BEFORE ISSUING A FINAL NEGATIVE REPORT Performed at [facility]    Report Status PENDING  Incomplete  Culture, blood  (routine x 2)     Status: None (Preliminary result)   Collection Time: 01/25/15 12:34 PM  Result Value Ref Range Status   Specimen Description BLOOD HAND RIGHT  Final   Special Requests BOTTLES DRAWN AEROBIC AND ANAEROBIC 10CC  Final   Culture   Final           BLOOD CULTURE RECEIVED NO GROWTH TO DATE CULTURE WILL BE HELD FOR 5 DAYS BEFORE ISSUING A FINAL NEGATIVE REPORT Performed at [facility]    Report Status PENDING  Incomplete  Urine culture     Status: None   Collection Time: 01/25/15  6:34 PM  Result Value Ref Range Status   Specimen Description URINE, RANDOM  Final   Special Requests Normal  Final   Colony Count NO GROWTH Performed at [facility]   Final   Culture NO GROWTH Performed at [facility]   Final   Report Status 01/27/2015 FINAL  Final     Scheduled Meds: . amLODipine  2.5 mg Oral Daily  . aspirin EC  81 mg Oral Daily  . atorvastatin  20 mg Oral q morning - 10a  . calcium carbonate  1 tablet Oral Q breakfast  . clopidogrel  75 mg Oral Daily  . doxazosin  4 mg Oral Daily  . folic acid  1 mg Oral Daily  . gabapentin  300 mg Oral TID  . heparin  5,000 Units Subcutaneous 3 times per day  . levofloxacin  IV  750 mg Intravenous Q48H  . levothyroxine  75 mcg Oral Daily  . loratadine  10 mg Oral Daily  . pantoprazole  40 mg Oral Daily  . polyethylene glycol  17 g Oral Daily  . predniSONE  10 mg Oral Q breakfast   Continuous Infusions:

## 2015-01-29 NOTE — Progress Notes (Signed)
Physical Therapy Treatment Patient Details Name: Barry Wright MRN: [id number] DOB: March 06, 1920 Today's Date: 01/29/2015    History of Present Illness Barry Wright is a 81 y.o. male with history of CAD, polymyalgia rheumatica on chronic prednisone, chronic kidney disease stage III, dyslipidemia, hypothyroidism and chronic diastolic heart failure. Patient resides at home with his wife but is non-ambulatory due to issues with his lower extremities. Patient and wife have caretaker who assists patient out of bed to the chair at home. Patient was sent to the ER by the caretaker, she found the patient febrile no the morning of admission- in the ER temp was 100.3. His main complaint was generalized weakness. No other symptoms. CXR revealed LLL infiltrate.     PT Comments    Pt practiced transfers to and from new w/c as well as w/c propulsion, requiring mod A for transfers today due to fatigue and generalized weakness. Pt, wife, and caregiver educated on new w/c and safety with transfers. PT will continue to follow.   Follow Up Recommendations  Home health PT     Equipment Recommendations  Wheelchair (measurements PT);Wheelchair cushion (measurements PT)    Recommendations for Other Services OT consult     Precautions / Restrictions Precautions Precautions: Fall Restrictions Weight Bearing Restrictions: No Other Position/Activity Restrictions: Pt with limited mobility, maily stand pivot transfers at baseline.    Mobility  Bed Mobility Overal bed mobility: Needs Assistance Bed Mobility: Supine to Sit;Sit to Supine     Supine to sit: Mod assist Sit to supine: Mod assist   General bed mobility comments: mod A for assistance of LE's off bed, min A for elevating trunk, with use of rail (at home, pt stays in recliner). Mod A for legs back into bed from sitting. Max A for scooting up in bed.  Transfers Overall transfer level: Needs assistance Equipment used: None Transfers: Sit  to/from [employer] Sit to Stand: Mod assist   Squat pivot transfers: Mod assist     General transfer comment: Practiced sit to stand from bed and from w/c, pt with lack of full knee extension in standing and inability to step feet or tolerate for more than 10 secs. Transfer was better from w/c than bed. Performed squat pivot 2x with mod A, required more help when going to right which is his weaker LE. Pt very fatigued by mobility.   Ambulation/Gait             General Gait Details: unable   Hotel manager mobility: Yes Wheelchair propulsion: Both upper extremities;Both lower extermities Wheelchair parts: Needs assistance Distance: 40 Wheelchair Assistance Details (indicate cue type and reason): pt taught to propel w/c with UE's but still needed min A due to UE weakness. When foot plates taken off and pt taught to use LE's as well, could proper very slowly without physical assist. Needed min hand over hand assist for turning as well.  Modified Rankin (Stroke Patients Only)       Balance Overall balance assessment: Needs assistance Sitting-balance support: No upper extremity supported;Feet supported Sitting balance-Leahy Scale: Fair     Standing balance support: Bilateral upper extremity supported;During functional activity Standing balance-Leahy Scale: Poor Standing balance comment: could not achieve full standing today                    Cognition Arousal/Alertness: Awake/alert Behavior During Therapy: WFL for tasks assessed/performed Overall  Cognitive Status: Within Functional Limits for tasks assessed                      Exercises      General Comments General comments (skin integrity, edema, etc.): O2 sats 93-94% on RA, HR 88 bpm at rest 108 with transfers      Pertinent Vitals/Pain Pain Assessment: Faces Faces Pain Scale: Hurts little more Pain Location:  back Pain Descriptors / Indicators: Aching Pain Intervention(s): Monitored during session;Repositioned    Home Living                      Prior Function            PT Goals (current goals can now be found in the care plan section) Acute Rehab PT Goals Patient Stated Goal: Go out to dinner PT Goal Formulation: With patient Time For Goal Achievement: 02/10/15 Potential to Achieve Goals: Fair Progress towards PT goals: Progressing toward goals    Frequency  Min 3X/week    PT Plan Current plan remains appropriate    Co-evaluation             End of Session Equipment Utilized During Treatment: Gait belt Activity Tolerance: Patient limited by fatigue Patient left: in bed;with call bell/phone within reach;with family/visitor present     Time: 1145-1240 PT Time Calculation (min) (ACUTE ONLY): 55 min  Charges:  $Therapeutic Activity: 38-52 mins $Self Care/Home Management: 8-22                    G Codes:     Leighton Roach, PT  Acute Rehab Services  [place], [place] 01/29/2015, 1:28 PM

## 2015-01-29 NOTE — Progress Notes (Signed)
Medicare Important Message given?  YES (If response is "NO", the following Medicare IM given date fields will be blank) Date Medicare IM given:  01/29/15 Medicare IM given by:  Rosabella Edgin 

## 2015-01-29 NOTE — Progress Notes (Signed)
Medicare Important Message given?  YES (If response is "NO", the following Medicare IM given date fields will be blank) Date Medicare IM given:  01/29/15 Medicare IM given by:  Ndeye Tenorio 

## 2015-01-30 LAB — BASIC METABOLIC PANEL
ANION GAP: 8 (ref 5–15)
BUN: 22 mg/dL (ref 6–23)
CALCIUM: 8.4 mg/dL (ref 8.4–10.5)
CO2: 27 mmol/L (ref 19–32)
Chloride: 107 mmol/L (ref 96–112)
Creatinine, Ser: 1.41 mg/dL — ABNORMAL HIGH (ref 0.50–1.35)
GFR calc non Af Amer: 42 mL/min — ABNORMAL LOW (ref >90)
GFR, EST AFRICAN AMERICAN: 48 mL/min — AB (ref >90)
Glucose, Bld: 98 mg/dL (ref 70–99)
Potassium: 4 mmol/L (ref 3.5–5.1)
Sodium: 142 mmol/L (ref 135–145)

## 2015-01-30 LAB — CBC
HEMATOCRIT: 34 % — AB (ref 39.0–52.0)
Hemoglobin: 11.1 g/dL — ABNORMAL LOW (ref 13.0–17.0)
MCH: 32.6 pg (ref 26.0–34.0)
MCHC: 32.6 g/dL (ref 30.0–36.0)
MCV: 100 fL (ref 78.0–100.0)
Platelets: 208 10*3/uL (ref 150–400)
RBC: 3.4 MIL/uL — AB (ref 4.22–5.81)
RDW: 14.4 % (ref 11.5–15.5)
WBC: 8.4 10*3/uL (ref 4.0–10.5)

## 2015-01-30 MED ORDER — GUAIFENESIN ER 600 MG PO TB12: 600.0000 mg | ORAL_TABLET | Freq: Two times a day (BID) | ORAL | Status: DC

## 2015-01-30 MED ORDER — LEVOFLOXACIN 750 MG PO TABS: 750.0000 mg | ORAL_TABLET | ORAL | Status: DC

## 2015-01-30 MED ORDER — FUROSEMIDE 20 MG PO TABS: 10.0000 mg | ORAL_TABLET | Freq: Every day | ORAL | Status: DC

## 2015-01-30 MED ORDER — LEVALBUTEROL HCL 1.25 MG/0.5ML IN NEBU: 1.2500 mg | INHALATION_SOLUTION | Freq: Four times a day (QID) | RESPIRATORY_TRACT | Status: DC | PRN

## 2015-01-30 NOTE — Discharge Summary (Signed)
Physician Discharge Summary  Barry Wright KPT:[id number] DOB: [birth year] DOA: 01/25/2015  PCP: Mathews Argyle, MD  Admit date: 01/25/2015 Discharge date: 01/30/2015  Recommendations for Outpatient Follow-up:  1. Pt will need to follow up with PCP in 2-3 weeks post discharge 2. Please obtain BMP to evaluate electrolytes and kidney function 3. Please also check CBC to evaluate Hg and Hct levels 4. Please note that pt needs to take two more doses of Levaquin upon discharge, he was advised to take it every other day  5. Please note that pt was started on low dose Lasix 10 mg PO QD for diastolic and systolic CHF 6. Weight on discharge 79.4 kg or 175 lbs, pt advised to monitor weight closely and to notify PCP of any increase of > 3-4 lbs in 24 hours   Discharge Diagnoses:  Principal Problem:   CAP (community acquired pneumonia) Active Problems:   Chronic kidney disease (CKD), stage III (moderate)   Polymyalgia rheumatica   Coronary artery disease   Essential hypertension, benign   Hypothyroidism   Protein-calorie malnutrition, moderate   Chronic diastolic heart failure   Dehydration  Discharge Condition: Stable  Diet recommendation: Heart healthy diet discussed in details   Brief narrative:    81 y.o. male with history of CAD, polymyalgia rheumatica on chronic prednisone, chronic kidney disease stage III, dyslipidemia, hypothyroidism and chronic diastolic heart failure. Patient resides at home with his wife but is non-ambulatory due to issues with his lower extremities. Patient and wife have caretaker who assists patient out of bed to the chair at home. Patient was sent to the ER by the caretaker, she found patient febrile on the morning of admission.  In the ED, pt had T 100.3 F and CXR revealed LLL infiltrate.  Assessment/Plan:    Principal Problem:  Sepsis secondary to lobar PNA, LLL - criteria for sepsis met on admission with fever, tachycardia, RR 24,  leukocytosis - source LLL PNA - respiratory status stable this AM - continue Levaquin for two more doses upon discharge  - WBC trending down and is WNL this AM - pt remains afebrile over the past 48 hours - he feels ready to go home   CAP (community acquired pneumonia), LLL, unknown bacterial pathogen  - ABX as noted above  Active Problems:  Chronic kidney disease (CKD), stage III (moderate) - Cr remains stable at 1.3 - 1.4  Hypokalemia - supplemented and WNL this AM  Polymyalgia rheumatica - stable, continue home regimen with Prednisone   Anemia of chronic disease, PMR - Hg stable over the past 24 hours  - no signs of active bleeding  Coronary artery disease - continue Aspirin, Plavix, Statin   Essential hypertension, benign - reasonable inpatient control - continue Norvasc   Hypothyroidism - continue synthroid   Protein-calorie malnutrition, moderate - in the context of acute illness - tolerating diet well   Acute on chronic diastolic and systolic heart failure - last 2 D ECHO in 07/2014 with EF 45 - 17% and diastolic CHF, grade I - weight is 81.69 kg on admission but up to 87 kg 3/12 --> 86.9 kg  --> 79 kg this AM - pt started on low dose Lasix 10 mg PO QD   Code Status: Full.  Family Communication: plan of care discussed with the patient Disposition Plan: Home   IV access:  Peripheral IV  Procedures and diagnostic studies:   Dg Chest 2 View 01/25/2015 Findings suggest left lower lobe infiltrate with small associated  pleural effusion concerning for pneumonia. There is also a nodular density over the left lower lobe for which CT thorax is suggested.   Ct Chest Wo Contrast 01/25/2015 Left lower lobe infiltrate with associated small effusion. Left lingular infiltrate which corresponds to the nodular changes on the recent chest x-ray. No parenchymal nodules are seen.   Dg Chest 2 View 01/28/2015 No change from the previous exam. 2. Persistent  opacity at the left lung base consistent with a combination of a small effusion in either atelectasis or infiltrate. No new lung opacities.   Medical Consultants:  None   Other Consultants:  None   IAnti-Infectives:   Levaquin 3/17 -->       Discharge Exam: Filed Vitals:   01/30/15 0612  BP: 129/64  Pulse: 70  Temp: 98 F (36.7 C)  Resp: 17   Filed Vitals:   01/29/15 1338 01/29/15 2142 01/30/15 0612 01/30/15 0616  BP: 105/54 127/63 129/64   Pulse: 87 84 70   Temp: 97.9 F (36.6 C) 97.8 F (36.6 C) 98 F (36.7 C)   TempSrc: Oral Oral Oral   Resp:  21 17   Height:      Weight:    79.4 kg (175 lb 0.7 oz)  SpO2: 93% 96% 97%     General: Pt is alert, follows commands appropriately, not in acute distress Cardiovascular: Regular rate and rhythm, no rubs, no gallops Respiratory: Clear to auscultation bilaterally, no wheezing, no crackles, no rhonchi Abdominal: Soft, non tender, non distended, bowel sounds +, no guarding Extremities: no cyanosis, pulses palpable bilaterally DP and PT Neuro: Grossly nonfocal  Discharge Instructions  Discharge Instructions    Diet - low sodium heart healthy    Complete by:  As directed      Increase activity slowly    Complete by:  As directed             Medication List    STOP taking these medications        methylPREDNISolone 4 MG tablet  Commonly known as:  MEDROL DOSEPAK      TAKE these medications        acetaminophen 500 MG tablet  Commonly known as:  TYLENOL  Take 1,000 mg by mouth every 8 (eight) hours as needed for pain.     amLODipine 2.5 MG tablet  Commonly known as:  NORVASC  Take 2.5 mg by mouth daily.     aspirin 81 MG tablet  Take 81 mg by mouth daily.     atorvastatin 20 MG tablet  Commonly known as:  LIPITOR  Take 20 mg by mouth every morning.     calcium carbonate 1250 (500 CA) MG tablet  Commonly known as:  OS-CAL - dosed in mg of elemental calcium  Take 1 tablet by mouth daily  with breakfast.     clopidogrel 75 MG tablet  Commonly known as:  PLAVIX  Take 75 mg by mouth daily.     doxazosin 4 MG tablet  Commonly known as:  CARDURA  Take 4 mg by mouth daily.     fexofenadine 180 MG tablet  Commonly known as:  ALLEGRA  Take 180 mg by mouth daily.     folic acid 1 MG tablet  Commonly known as:  FOLVITE  Take 1 mg by mouth daily.     furosemide 20 MG tablet  Commonly known as:  LASIX  Take 0.5 tablets (10 mg total) by mouth daily.     gabapentin  300 MG capsule  Commonly known as:  NEURONTIN  Take 300 mg by mouth 3 (three) times daily.     guaiFENesin 600 MG 12 hr tablet  Commonly known as:  MUCINEX  Take 1 tablet (600 mg total) by mouth 2 (two) times daily.     levalbuterol 1.25 MG/0.5ML nebulizer solution  Commonly known as:  XOPENEX  Take 1.25 mg by nebulization every 6 (six) hours as needed for wheezing or shortness of breath.     levofloxacin 750 MG tablet  Commonly known as:  LEVAQUIN  Take 1 tablet (750 mg total) by mouth every other day.     levothyroxine 75 MCG tablet  Commonly known as:  SYNTHROID, LEVOTHROID  Take 75 mcg by mouth daily.     methotrexate 2.5 MG tablet  Commonly known as:  RHEUMATREX  Take 10 mg by mouth every Wednesday.     multivitamins ther. w/minerals Tabs tablet  Take 1 tablet by mouth daily.     nitroGLYCERIN 0.4 MG SL tablet  Commonly known as:  NITROSTAT  Place 0.4 mg under the tongue every 5 (five) minutes as needed for chest pain.     omeprazole 20 MG capsule  Commonly known as:  PRILOSEC  Take 20 mg by mouth daily.     ondansetron 4 MG tablet  Commonly known as:  ZOFRAN  Take 4 mg by mouth every 8 (eight) hours as needed for nausea or vomiting.     polyethylene glycol packet  Commonly known as:  MIRALAX / GLYCOLAX  Take 17 g by mouth daily.     predniSONE 5 MG tablet  Commonly known as:  DELTASONE  Take 10 mg by mouth daily with breakfast.     traMADol 50 MG tablet  Commonly known as:   ULTRAM  Take one tablet by mouth twice daily for pains           Follow-up Information    Follow up with Faye Ramsay, MD.   Specialty:  Internal Medicine   Why:  call my cell phone [phone]   Contact information:   [address] [place] [address] [phone]        The results of significant diagnostics from this hospitalization (including imaging, microbiology, ancillary and laboratory) are listed below for reference.     Microbiology: Recent Results (from the past 240 hour(s))  Culture, blood (routine x 2)     Status: None (Preliminary result)   Collection Time: 01/25/15 12:29 PM  Result Value Ref Range Status   Specimen Description BLOOD BLOOD RIGHT FOREARM  Final   Special Requests BOTTLES DRAWN AEROBIC AND ANAEROBIC 10CC  Final   Culture   Final           BLOOD CULTURE RECEIVED NO GROWTH TO DATE CULTURE WILL BE HELD FOR 5 DAYS BEFORE ISSUING A FINAL NEGATIVE REPORT Performed at [facility]    Report Status PENDING  Incomplete  Culture, blood (routine x 2)     Status: None (Preliminary result)   Collection Time: 01/25/15 12:34 PM  Result Value Ref Range Status   Specimen Description BLOOD HAND RIGHT  Final   Special Requests BOTTLES DRAWN AEROBIC AND ANAEROBIC 10CC  Final   Culture   Final           BLOOD CULTURE RECEIVED NO GROWTH TO DATE CULTURE WILL BE HELD FOR 5 DAYS BEFORE ISSUING A FINAL NEGATIVE REPORT Performed at [facility]    Report  Status PENDING  Incomplete  Urine culture     Status: None   Collection Time: 01/25/15  6:34 PM  Result Value Ref Range Status   Specimen Description URINE, RANDOM  Final   Special Requests Normal  Final   Colony Count NO GROWTH Performed at [facility]   Final   Culture NO GROWTH Performed at [facility]   Final   Report Status 01/27/2015 FINAL  Final     Labs: Basic Metabolic Panel:  Recent Labs Lab 01/26/15 0620 01/27/15 1635  01/28/15 0541 01/29/15 0640 01/30/15 0614  NA 138 138 140 142 142  K 3.9 3.5 3.4* 4.0 4.0  CL 105 110 113* 111 107  CO2 _0 GLUCOSE 105* 207* 99 94 98  BUN _1 CREATININE 1.46* 1.46* 1.39* 1.39* 1.41*  CALCIUM 8.2* 7.7* 7.8* 8.1* 8.4   Liver Function Tests:  Recent Labs Lab 01/26/15 0620  AST 16  ALT 12  ALKPHOS 59  BILITOT 0.8  PROT 4.9*  ALBUMIN 2.1*   No results for input(s): LIPASE, AMYLASE in the last 168 hours. No results for input(s): AMMONIA in the last 168 hours. CBC:  Recent Labs Lab 01/25/15 1108 01/26/15 0620 01/28/15 0541 01/29/15 0640 01/30/15 0614  WBC 13.6* 12.8* 8.5 8.1 8.4  NEUTROABS 10.5* 10.2*  --   --   --   HGB 13.3 12.0* 10.3* 11.1* 11.1*  HCT 39.7 36.5* 31.3* 33.2* 34.0*  MCV 99.7 100.3* 100.3* 100.3* 100.0  PLT 186 196 204 209 208   Cardiac Enzymes: No results for input(s): CKTOTAL, CKMB, CKMBINDEX, TROPONINI in the last 168 hours. BNP: BNP (last 3 results) No results for input(s): BNP in the last 8760 hours.  ProBNP (last 3 results) No results for input(s): PROBNP in the last 8760 hours.  CBG:  Recent Labs Lab 01/26/15 1623  GLUCAP 155*     SIGNED: Time coordinating discharge: Over 30 minutes  Faye Ramsay, MD  [name] 01/30/2015, 10:11 AM Pager [phone]  If 7PM-7AM, please contact night-coverage [email or web address] Password [email or web address]

## 2015-01-30 NOTE — Discharge Instructions (Signed)

## 2015-01-30 NOTE — Progress Notes (Signed)
Patient given discharge instructions.  Patient and wife verbalized understanding of discharge instructions.  Patient PIV removed site clean, dry, and intact.  Filed Vitals:   01/30/15 1040  BP: 125/56  Pulse:   Temp:   Resp:      Medication List    STOP taking these medications        methylPREDNISolone 4 MG tablet  Commonly known as:  MEDROL DOSEPAK      TAKE these medications        acetaminophen 500 MG tablet  Commonly known as:  TYLENOL  Take 1,000 mg by mouth every 8 (eight) hours as needed for pain.     amLODipine 2.5 MG tablet  Commonly known as:  NORVASC  Take 2.5 mg by mouth daily.     aspirin 81 MG tablet  Take 81 mg by mouth daily.     atorvastatin 20 MG tablet  Commonly known as:  LIPITOR  Take 20 mg by mouth every morning.     calcium carbonate 1250 (500 CA) MG tablet  Commonly known as:  OS-CAL - dosed in mg of elemental calcium  Take 1 tablet by mouth daily with breakfast.     clopidogrel 75 MG tablet  Commonly known as:  PLAVIX  Take 75 mg by mouth daily.     doxazosin 4 MG tablet  Commonly known as:  CARDURA  Take 4 mg by mouth daily.     fexofenadine 180 MG tablet  Commonly known as:  ALLEGRA  Take 180 mg by mouth daily.     folic acid 1 MG tablet  Commonly known as:  FOLVITE  Take 1 mg by mouth daily.     furosemide 20 MG tablet  Commonly known as:  LASIX  Take 0.5 tablets (10 mg total) by mouth daily.     gabapentin 300 MG capsule  Commonly known as:  NEURONTIN  Take 300 mg by mouth 3 (three) times daily.     guaiFENesin 600 MG 12 hr tablet  Commonly known as:  MUCINEX  Take 1 tablet (600 mg total) by mouth 2 (two) times daily.     levalbuterol 1.25 MG/0.5ML nebulizer solution  Commonly known as:  XOPENEX  Take 1.25 mg by nebulization every 6 (six) hours as needed for wheezing or shortness of breath.     levofloxacin 750 MG tablet  Commonly known as:  LEVAQUIN  Take 1 tablet (750 mg total) by mouth every other day.     levothyroxine 75 MCG tablet  Commonly known as:  SYNTHROID, LEVOTHROID  Take 75 mcg by mouth daily.     methotrexate 2.5 MG tablet  Commonly known as:  RHEUMATREX  Take 10 mg by mouth every Wednesday.     multivitamins ther. w/minerals Tabs tablet  Take 1 tablet by mouth daily.     nitroGLYCERIN 0.4 MG SL tablet  Commonly known as:  NITROSTAT  Place 0.4 mg under the tongue every 5 (five) minutes as needed for chest pain.     omeprazole 20 MG capsule  Commonly known as:  PRILOSEC  Take 20 mg by mouth daily.     ondansetron 4 MG tablet  Commonly known as:  ZOFRAN  Take 4 mg by mouth every 8 (eight) hours as needed for nausea or vomiting.     polyethylene glycol packet  Commonly known as:  MIRALAX / GLYCOLAX  Take 17 g by mouth daily.     predniSONE 5 MG tablet  Commonly known as:  DELTASONE  Take 10 mg by mouth daily with breakfast.     traMADol 50 MG tablet  Commonly known as:  ULTRAM  Take one tablet by mouth twice daily for pains      patient escorted via wheelchair to car.   Forrestine Him, RN 01/30/15 at [phone]

## 2015-01-31 DIAGNOSIS — I5032 Chronic diastolic (congestive) heart failure: Secondary | ICD-10-CM | POA: Diagnosis not present

## 2015-01-31 DIAGNOSIS — J181 Lobar pneumonia, unspecified organism: Secondary | ICD-10-CM | POA: Diagnosis not present

## 2015-01-31 DIAGNOSIS — E43 Unspecified severe protein-calorie malnutrition: Secondary | ICD-10-CM | POA: Diagnosis not present

## 2015-01-31 DIAGNOSIS — N183 Chronic kidney disease, stage 3 (moderate): Secondary | ICD-10-CM | POA: Diagnosis not present

## 2015-01-31 DIAGNOSIS — I252 Old myocardial infarction: Secondary | ICD-10-CM | POA: Diagnosis not present

## 2015-01-31 DIAGNOSIS — I2510 Atherosclerotic heart disease of native coronary artery without angina pectoris: Secondary | ICD-10-CM | POA: Diagnosis not present

## 2015-01-31 DIAGNOSIS — I129 Hypertensive chronic kidney disease with stage 1 through stage 4 chronic kidney disease, or unspecified chronic kidney disease: Secondary | ICD-10-CM | POA: Diagnosis not present

## 2015-01-31 DIAGNOSIS — Z87891 Personal history of nicotine dependence: Secondary | ICD-10-CM | POA: Diagnosis not present

## 2015-01-31 DIAGNOSIS — M353 Polymyalgia rheumatica: Secondary | ICD-10-CM | POA: Diagnosis not present

## 2015-01-31 DIAGNOSIS — Z951 Presence of aortocoronary bypass graft: Secondary | ICD-10-CM | POA: Diagnosis not present

## 2015-01-31 LAB — CULTURE, BLOOD (ROUTINE X 2)
Culture: NO GROWTH
Culture: NO GROWTH

## 2015-02-02 DIAGNOSIS — I2510 Atherosclerotic heart disease of native coronary artery without angina pectoris: Secondary | ICD-10-CM | POA: Diagnosis not present

## 2015-02-02 DIAGNOSIS — I5032 Chronic diastolic (congestive) heart failure: Secondary | ICD-10-CM | POA: Diagnosis not present

## 2015-02-02 DIAGNOSIS — J181 Lobar pneumonia, unspecified organism: Secondary | ICD-10-CM | POA: Diagnosis not present

## 2015-02-02 DIAGNOSIS — I129 Hypertensive chronic kidney disease with stage 1 through stage 4 chronic kidney disease, or unspecified chronic kidney disease: Secondary | ICD-10-CM | POA: Diagnosis not present

## 2015-02-02 DIAGNOSIS — N183 Chronic kidney disease, stage 3 (moderate): Secondary | ICD-10-CM | POA: Diagnosis not present

## 2015-02-02 DIAGNOSIS — M353 Polymyalgia rheumatica: Secondary | ICD-10-CM | POA: Diagnosis not present

## 2015-02-03 DIAGNOSIS — I2510 Atherosclerotic heart disease of native coronary artery without angina pectoris: Secondary | ICD-10-CM | POA: Diagnosis not present

## 2015-02-03 DIAGNOSIS — I129 Hypertensive chronic kidney disease with stage 1 through stage 4 chronic kidney disease, or unspecified chronic kidney disease: Secondary | ICD-10-CM | POA: Diagnosis not present

## 2015-02-03 DIAGNOSIS — J181 Lobar pneumonia, unspecified organism: Secondary | ICD-10-CM | POA: Diagnosis not present

## 2015-02-03 DIAGNOSIS — N183 Chronic kidney disease, stage 3 (moderate): Secondary | ICD-10-CM | POA: Diagnosis not present

## 2015-02-03 DIAGNOSIS — I5032 Chronic diastolic (congestive) heart failure: Secondary | ICD-10-CM | POA: Diagnosis not present

## 2015-02-03 DIAGNOSIS — M353 Polymyalgia rheumatica: Secondary | ICD-10-CM | POA: Diagnosis not present

## 2015-02-05 DIAGNOSIS — I5032 Chronic diastolic (congestive) heart failure: Secondary | ICD-10-CM | POA: Diagnosis not present

## 2015-02-05 DIAGNOSIS — N183 Chronic kidney disease, stage 3 (moderate): Secondary | ICD-10-CM | POA: Diagnosis not present

## 2015-02-05 DIAGNOSIS — I2510 Atherosclerotic heart disease of native coronary artery without angina pectoris: Secondary | ICD-10-CM | POA: Diagnosis not present

## 2015-02-05 DIAGNOSIS — J181 Lobar pneumonia, unspecified organism: Secondary | ICD-10-CM | POA: Diagnosis not present

## 2015-02-05 DIAGNOSIS — I129 Hypertensive chronic kidney disease with stage 1 through stage 4 chronic kidney disease, or unspecified chronic kidney disease: Secondary | ICD-10-CM | POA: Diagnosis not present

## 2015-02-05 DIAGNOSIS — M353 Polymyalgia rheumatica: Secondary | ICD-10-CM | POA: Diagnosis not present

## 2015-02-06 ENCOUNTER — Ambulatory Visit
Admission: RE | Admit: 2015-02-06 | Discharge: 2015-02-06 | Disposition: A | Discharge status: Home / Self Care | Payer: Medicare Other | Source: Ambulatory Visit | Attending: Geriatric Medicine | Admitting: Geriatric Medicine

## 2015-02-06 ENCOUNTER — Other Ambulatory Visit: Payer: Self-pay | Admitting: Geriatric Medicine

## 2015-02-06 DIAGNOSIS — Z79899 Other long term (current) drug therapy: Secondary | ICD-10-CM | POA: Diagnosis not present

## 2015-02-06 DIAGNOSIS — M069 Rheumatoid arthritis, unspecified: Secondary | ICD-10-CM | POA: Diagnosis not present

## 2015-02-06 DIAGNOSIS — R269 Unspecified abnormalities of gait and mobility: Secondary | ICD-10-CM | POA: Diagnosis not present

## 2015-02-06 DIAGNOSIS — R609 Edema, unspecified: Secondary | ICD-10-CM | POA: Diagnosis not present

## 2015-02-06 DIAGNOSIS — E039 Hypothyroidism, unspecified: Secondary | ICD-10-CM | POA: Diagnosis not present

## 2015-02-06 DIAGNOSIS — M4806 Spinal stenosis, lumbar region: Secondary | ICD-10-CM | POA: Diagnosis not present

## 2015-02-06 DIAGNOSIS — J189 Pneumonia, unspecified organism: Secondary | ICD-10-CM

## 2015-02-06 DIAGNOSIS — Z951 Presence of aortocoronary bypass graft: Secondary | ICD-10-CM | POA: Diagnosis not present

## 2015-02-06 DIAGNOSIS — I129 Hypertensive chronic kidney disease with stage 1 through stage 4 chronic kidney disease, or unspecified chronic kidney disease: Secondary | ICD-10-CM | POA: Diagnosis not present

## 2015-02-07 DIAGNOSIS — I129 Hypertensive chronic kidney disease with stage 1 through stage 4 chronic kidney disease, or unspecified chronic kidney disease: Secondary | ICD-10-CM | POA: Diagnosis not present

## 2015-02-07 DIAGNOSIS — I5032 Chronic diastolic (congestive) heart failure: Secondary | ICD-10-CM | POA: Diagnosis not present

## 2015-02-07 DIAGNOSIS — N183 Chronic kidney disease, stage 3 (moderate): Secondary | ICD-10-CM | POA: Diagnosis not present

## 2015-02-07 DIAGNOSIS — J181 Lobar pneumonia, unspecified organism: Secondary | ICD-10-CM | POA: Diagnosis not present

## 2015-02-07 DIAGNOSIS — I2510 Atherosclerotic heart disease of native coronary artery without angina pectoris: Secondary | ICD-10-CM | POA: Diagnosis not present

## 2015-02-07 DIAGNOSIS — M353 Polymyalgia rheumatica: Secondary | ICD-10-CM | POA: Diagnosis not present

## 2015-02-08 DIAGNOSIS — Z79899 Other long term (current) drug therapy: Secondary | ICD-10-CM | POA: Diagnosis not present

## 2015-02-09 DIAGNOSIS — I2510 Atherosclerotic heart disease of native coronary artery without angina pectoris: Secondary | ICD-10-CM | POA: Diagnosis not present

## 2015-02-09 DIAGNOSIS — N183 Chronic kidney disease, stage 3 (moderate): Secondary | ICD-10-CM | POA: Diagnosis not present

## 2015-02-09 DIAGNOSIS — J181 Lobar pneumonia, unspecified organism: Secondary | ICD-10-CM | POA: Diagnosis not present

## 2015-02-09 DIAGNOSIS — M353 Polymyalgia rheumatica: Secondary | ICD-10-CM | POA: Diagnosis not present

## 2015-02-09 DIAGNOSIS — I129 Hypertensive chronic kidney disease with stage 1 through stage 4 chronic kidney disease, or unspecified chronic kidney disease: Secondary | ICD-10-CM | POA: Diagnosis not present

## 2015-02-09 DIAGNOSIS — I5032 Chronic diastolic (congestive) heart failure: Secondary | ICD-10-CM | POA: Diagnosis not present

## 2015-02-12 DIAGNOSIS — J181 Lobar pneumonia, unspecified organism: Secondary | ICD-10-CM | POA: Diagnosis not present

## 2015-02-12 DIAGNOSIS — M353 Polymyalgia rheumatica: Secondary | ICD-10-CM | POA: Diagnosis not present

## 2015-02-12 DIAGNOSIS — I5032 Chronic diastolic (congestive) heart failure: Secondary | ICD-10-CM | POA: Diagnosis not present

## 2015-02-12 DIAGNOSIS — I2510 Atherosclerotic heart disease of native coronary artery without angina pectoris: Secondary | ICD-10-CM | POA: Diagnosis not present

## 2015-02-12 DIAGNOSIS — I129 Hypertensive chronic kidney disease with stage 1 through stage 4 chronic kidney disease, or unspecified chronic kidney disease: Secondary | ICD-10-CM | POA: Diagnosis not present

## 2015-02-12 DIAGNOSIS — N183 Chronic kidney disease, stage 3 (moderate): Secondary | ICD-10-CM | POA: Diagnosis not present

## 2015-02-14 DIAGNOSIS — N183 Chronic kidney disease, stage 3 (moderate): Secondary | ICD-10-CM | POA: Diagnosis not present

## 2015-02-14 DIAGNOSIS — M353 Polymyalgia rheumatica: Secondary | ICD-10-CM | POA: Diagnosis not present

## 2015-02-14 DIAGNOSIS — I2510 Atherosclerotic heart disease of native coronary artery without angina pectoris: Secondary | ICD-10-CM | POA: Diagnosis not present

## 2015-02-14 DIAGNOSIS — J181 Lobar pneumonia, unspecified organism: Secondary | ICD-10-CM | POA: Diagnosis not present

## 2015-02-14 DIAGNOSIS — I5032 Chronic diastolic (congestive) heart failure: Secondary | ICD-10-CM | POA: Diagnosis not present

## 2015-02-14 DIAGNOSIS — I129 Hypertensive chronic kidney disease with stage 1 through stage 4 chronic kidney disease, or unspecified chronic kidney disease: Secondary | ICD-10-CM | POA: Diagnosis not present

## 2015-02-16 DIAGNOSIS — I5032 Chronic diastolic (congestive) heart failure: Secondary | ICD-10-CM | POA: Diagnosis not present

## 2015-02-16 DIAGNOSIS — J181 Lobar pneumonia, unspecified organism: Secondary | ICD-10-CM | POA: Diagnosis not present

## 2015-02-16 DIAGNOSIS — M353 Polymyalgia rheumatica: Secondary | ICD-10-CM | POA: Diagnosis not present

## 2015-02-16 DIAGNOSIS — I129 Hypertensive chronic kidney disease with stage 1 through stage 4 chronic kidney disease, or unspecified chronic kidney disease: Secondary | ICD-10-CM | POA: Diagnosis not present

## 2015-02-16 DIAGNOSIS — I2510 Atherosclerotic heart disease of native coronary artery without angina pectoris: Secondary | ICD-10-CM | POA: Diagnosis not present

## 2015-02-16 DIAGNOSIS — N183 Chronic kidney disease, stage 3 (moderate): Secondary | ICD-10-CM | POA: Diagnosis not present

## 2015-02-19 DIAGNOSIS — J181 Lobar pneumonia, unspecified organism: Secondary | ICD-10-CM | POA: Diagnosis not present

## 2015-02-19 DIAGNOSIS — N183 Chronic kidney disease, stage 3 (moderate): Secondary | ICD-10-CM | POA: Diagnosis not present

## 2015-02-19 DIAGNOSIS — I5032 Chronic diastolic (congestive) heart failure: Secondary | ICD-10-CM | POA: Diagnosis not present

## 2015-02-19 DIAGNOSIS — M353 Polymyalgia rheumatica: Secondary | ICD-10-CM | POA: Diagnosis not present

## 2015-02-19 DIAGNOSIS — I129 Hypertensive chronic kidney disease with stage 1 through stage 4 chronic kidney disease, or unspecified chronic kidney disease: Secondary | ICD-10-CM | POA: Diagnosis not present

## 2015-02-19 DIAGNOSIS — I2510 Atherosclerotic heart disease of native coronary artery without angina pectoris: Secondary | ICD-10-CM | POA: Diagnosis not present

## 2015-02-21 DIAGNOSIS — N183 Chronic kidney disease, stage 3 (moderate): Secondary | ICD-10-CM | POA: Diagnosis not present

## 2015-02-21 DIAGNOSIS — J181 Lobar pneumonia, unspecified organism: Secondary | ICD-10-CM | POA: Diagnosis not present

## 2015-02-21 DIAGNOSIS — I129 Hypertensive chronic kidney disease with stage 1 through stage 4 chronic kidney disease, or unspecified chronic kidney disease: Secondary | ICD-10-CM | POA: Diagnosis not present

## 2015-02-21 DIAGNOSIS — I2510 Atherosclerotic heart disease of native coronary artery without angina pectoris: Secondary | ICD-10-CM | POA: Diagnosis not present

## 2015-02-21 DIAGNOSIS — I5032 Chronic diastolic (congestive) heart failure: Secondary | ICD-10-CM | POA: Diagnosis not present

## 2015-02-21 DIAGNOSIS — M353 Polymyalgia rheumatica: Secondary | ICD-10-CM | POA: Diagnosis not present

## 2015-02-22 ENCOUNTER — Telehealth: Payer: Self-pay | Admitting: Interventional Cardiology

## 2015-02-22 NOTE — Telephone Encounter (Signed)
New Message  Pt, after applying for benefits through the [place] and using Dr. Tamala Julian as a reference, wanted to inform Dr. Tamala Julian that he is in wheelchair and can no longer walk.

## 2015-02-22 NOTE — Telephone Encounter (Signed)
Will route update/FYI to Dr.Smith

## 2015-02-23 DIAGNOSIS — J181 Lobar pneumonia, unspecified organism: Secondary | ICD-10-CM | POA: Diagnosis not present

## 2015-02-23 DIAGNOSIS — M353 Polymyalgia rheumatica: Secondary | ICD-10-CM | POA: Diagnosis not present

## 2015-02-23 DIAGNOSIS — I129 Hypertensive chronic kidney disease with stage 1 through stage 4 chronic kidney disease, or unspecified chronic kidney disease: Secondary | ICD-10-CM | POA: Diagnosis not present

## 2015-02-23 DIAGNOSIS — I5032 Chronic diastolic (congestive) heart failure: Secondary | ICD-10-CM | POA: Diagnosis not present

## 2015-02-23 DIAGNOSIS — N183 Chronic kidney disease, stage 3 (moderate): Secondary | ICD-10-CM | POA: Diagnosis not present

## 2015-02-23 DIAGNOSIS — I2510 Atherosclerotic heart disease of native coronary artery without angina pectoris: Secondary | ICD-10-CM | POA: Diagnosis not present

## 2015-02-26 DIAGNOSIS — J181 Lobar pneumonia, unspecified organism: Secondary | ICD-10-CM | POA: Diagnosis not present

## 2015-02-26 DIAGNOSIS — I2510 Atherosclerotic heart disease of native coronary artery without angina pectoris: Secondary | ICD-10-CM | POA: Diagnosis not present

## 2015-02-26 DIAGNOSIS — I5032 Chronic diastolic (congestive) heart failure: Secondary | ICD-10-CM | POA: Diagnosis not present

## 2015-02-26 DIAGNOSIS — M353 Polymyalgia rheumatica: Secondary | ICD-10-CM | POA: Diagnosis not present

## 2015-02-26 DIAGNOSIS — N183 Chronic kidney disease, stage 3 (moderate): Secondary | ICD-10-CM | POA: Diagnosis not present

## 2015-02-26 DIAGNOSIS — I129 Hypertensive chronic kidney disease with stage 1 through stage 4 chronic kidney disease, or unspecified chronic kidney disease: Secondary | ICD-10-CM | POA: Diagnosis not present

## 2015-02-28 DIAGNOSIS — J181 Lobar pneumonia, unspecified organism: Secondary | ICD-10-CM | POA: Diagnosis not present

## 2015-02-28 DIAGNOSIS — I2510 Atherosclerotic heart disease of native coronary artery without angina pectoris: Secondary | ICD-10-CM | POA: Diagnosis not present

## 2015-02-28 DIAGNOSIS — I129 Hypertensive chronic kidney disease with stage 1 through stage 4 chronic kidney disease, or unspecified chronic kidney disease: Secondary | ICD-10-CM | POA: Diagnosis not present

## 2015-02-28 DIAGNOSIS — I5032 Chronic diastolic (congestive) heart failure: Secondary | ICD-10-CM | POA: Diagnosis not present

## 2015-02-28 DIAGNOSIS — N183 Chronic kidney disease, stage 3 (moderate): Secondary | ICD-10-CM | POA: Diagnosis not present

## 2015-02-28 DIAGNOSIS — M353 Polymyalgia rheumatica: Secondary | ICD-10-CM | POA: Diagnosis not present

## 2015-03-01 DIAGNOSIS — M4806 Spinal stenosis, lumbar region: Secondary | ICD-10-CM | POA: Diagnosis not present

## 2015-03-01 DIAGNOSIS — R269 Unspecified abnormalities of gait and mobility: Secondary | ICD-10-CM | POA: Diagnosis not present

## 2015-03-02 DIAGNOSIS — M5126 Other intervertebral disc displacement, lumbar region: Secondary | ICD-10-CM | POA: Diagnosis not present

## 2015-03-07 DIAGNOSIS — M353 Polymyalgia rheumatica: Secondary | ICD-10-CM | POA: Diagnosis not present

## 2015-03-07 DIAGNOSIS — N183 Chronic kidney disease, stage 3 (moderate): Secondary | ICD-10-CM | POA: Diagnosis not present

## 2015-03-07 DIAGNOSIS — I2510 Atherosclerotic heart disease of native coronary artery without angina pectoris: Secondary | ICD-10-CM | POA: Diagnosis not present

## 2015-03-07 DIAGNOSIS — I129 Hypertensive chronic kidney disease with stage 1 through stage 4 chronic kidney disease, or unspecified chronic kidney disease: Secondary | ICD-10-CM | POA: Diagnosis not present

## 2015-03-07 DIAGNOSIS — J181 Lobar pneumonia, unspecified organism: Secondary | ICD-10-CM | POA: Diagnosis not present

## 2015-03-07 DIAGNOSIS — I5032 Chronic diastolic (congestive) heart failure: Secondary | ICD-10-CM | POA: Diagnosis not present

## 2015-03-09 DIAGNOSIS — M353 Polymyalgia rheumatica: Secondary | ICD-10-CM | POA: Diagnosis not present

## 2015-03-09 DIAGNOSIS — N183 Chronic kidney disease, stage 3 (moderate): Secondary | ICD-10-CM | POA: Diagnosis not present

## 2015-03-09 DIAGNOSIS — J181 Lobar pneumonia, unspecified organism: Secondary | ICD-10-CM | POA: Diagnosis not present

## 2015-03-09 DIAGNOSIS — I5032 Chronic diastolic (congestive) heart failure: Secondary | ICD-10-CM | POA: Diagnosis not present

## 2015-03-09 DIAGNOSIS — I129 Hypertensive chronic kidney disease with stage 1 through stage 4 chronic kidney disease, or unspecified chronic kidney disease: Secondary | ICD-10-CM | POA: Diagnosis not present

## 2015-03-09 DIAGNOSIS — I2510 Atherosclerotic heart disease of native coronary artery without angina pectoris: Secondary | ICD-10-CM | POA: Diagnosis not present

## 2015-03-13 DIAGNOSIS — J181 Lobar pneumonia, unspecified organism: Secondary | ICD-10-CM | POA: Diagnosis not present

## 2015-03-13 DIAGNOSIS — N183 Chronic kidney disease, stage 3 (moderate): Secondary | ICD-10-CM | POA: Diagnosis not present

## 2015-03-13 DIAGNOSIS — I2510 Atherosclerotic heart disease of native coronary artery without angina pectoris: Secondary | ICD-10-CM | POA: Diagnosis not present

## 2015-03-13 DIAGNOSIS — M353 Polymyalgia rheumatica: Secondary | ICD-10-CM | POA: Diagnosis not present

## 2015-03-13 DIAGNOSIS — I129 Hypertensive chronic kidney disease with stage 1 through stage 4 chronic kidney disease, or unspecified chronic kidney disease: Secondary | ICD-10-CM | POA: Diagnosis not present

## 2015-03-13 DIAGNOSIS — M2550 Pain in unspecified joint: Secondary | ICD-10-CM | POA: Diagnosis not present

## 2015-03-13 DIAGNOSIS — M150 Primary generalized (osteo)arthritis: Secondary | ICD-10-CM | POA: Diagnosis not present

## 2015-03-13 DIAGNOSIS — M0609 Rheumatoid arthritis without rheumatoid factor, multiple sites: Secondary | ICD-10-CM | POA: Diagnosis not present

## 2015-03-13 DIAGNOSIS — I5032 Chronic diastolic (congestive) heart failure: Secondary | ICD-10-CM | POA: Diagnosis not present

## 2015-03-15 DIAGNOSIS — I129 Hypertensive chronic kidney disease with stage 1 through stage 4 chronic kidney disease, or unspecified chronic kidney disease: Secondary | ICD-10-CM | POA: Diagnosis not present

## 2015-03-15 DIAGNOSIS — N183 Chronic kidney disease, stage 3 (moderate): Secondary | ICD-10-CM | POA: Diagnosis not present

## 2015-03-15 DIAGNOSIS — M71572 Other bursitis, not elsewhere classified, left ankle and foot: Secondary | ICD-10-CM | POA: Diagnosis not present

## 2015-03-15 DIAGNOSIS — I5032 Chronic diastolic (congestive) heart failure: Secondary | ICD-10-CM | POA: Diagnosis not present

## 2015-03-15 DIAGNOSIS — I2510 Atherosclerotic heart disease of native coronary artery without angina pectoris: Secondary | ICD-10-CM | POA: Diagnosis not present

## 2015-03-15 DIAGNOSIS — J181 Lobar pneumonia, unspecified organism: Secondary | ICD-10-CM | POA: Diagnosis not present

## 2015-03-15 DIAGNOSIS — M353 Polymyalgia rheumatica: Secondary | ICD-10-CM | POA: Diagnosis not present

## 2015-03-20 DIAGNOSIS — I129 Hypertensive chronic kidney disease with stage 1 through stage 4 chronic kidney disease, or unspecified chronic kidney disease: Secondary | ICD-10-CM | POA: Diagnosis not present

## 2015-03-20 DIAGNOSIS — J181 Lobar pneumonia, unspecified organism: Secondary | ICD-10-CM | POA: Diagnosis not present

## 2015-03-20 DIAGNOSIS — I2510 Atherosclerotic heart disease of native coronary artery without angina pectoris: Secondary | ICD-10-CM | POA: Diagnosis not present

## 2015-03-20 DIAGNOSIS — I5032 Chronic diastolic (congestive) heart failure: Secondary | ICD-10-CM | POA: Diagnosis not present

## 2015-03-20 DIAGNOSIS — N183 Chronic kidney disease, stage 3 (moderate): Secondary | ICD-10-CM | POA: Diagnosis not present

## 2015-03-20 DIAGNOSIS — M353 Polymyalgia rheumatica: Secondary | ICD-10-CM | POA: Diagnosis not present

## 2015-03-23 DIAGNOSIS — J181 Lobar pneumonia, unspecified organism: Secondary | ICD-10-CM | POA: Diagnosis not present

## 2015-03-23 DIAGNOSIS — I5032 Chronic diastolic (congestive) heart failure: Secondary | ICD-10-CM | POA: Diagnosis not present

## 2015-03-23 DIAGNOSIS — I2510 Atherosclerotic heart disease of native coronary artery without angina pectoris: Secondary | ICD-10-CM | POA: Diagnosis not present

## 2015-03-23 DIAGNOSIS — N183 Chronic kidney disease, stage 3 (moderate): Secondary | ICD-10-CM | POA: Diagnosis not present

## 2015-03-23 DIAGNOSIS — I129 Hypertensive chronic kidney disease with stage 1 through stage 4 chronic kidney disease, or unspecified chronic kidney disease: Secondary | ICD-10-CM | POA: Diagnosis not present

## 2015-03-23 DIAGNOSIS — M353 Polymyalgia rheumatica: Secondary | ICD-10-CM | POA: Diagnosis not present

## 2015-03-26 DIAGNOSIS — I129 Hypertensive chronic kidney disease with stage 1 through stage 4 chronic kidney disease, or unspecified chronic kidney disease: Secondary | ICD-10-CM | POA: Diagnosis not present

## 2015-03-26 DIAGNOSIS — J181 Lobar pneumonia, unspecified organism: Secondary | ICD-10-CM | POA: Diagnosis not present

## 2015-03-26 DIAGNOSIS — I5032 Chronic diastolic (congestive) heart failure: Secondary | ICD-10-CM | POA: Diagnosis not present

## 2015-03-26 DIAGNOSIS — N183 Chronic kidney disease, stage 3 (moderate): Secondary | ICD-10-CM | POA: Diagnosis not present

## 2015-03-26 DIAGNOSIS — I2510 Atherosclerotic heart disease of native coronary artery without angina pectoris: Secondary | ICD-10-CM | POA: Diagnosis not present

## 2015-03-26 DIAGNOSIS — M353 Polymyalgia rheumatica: Secondary | ICD-10-CM | POA: Diagnosis not present

## 2015-03-27 DIAGNOSIS — N183 Chronic kidney disease, stage 3 (moderate): Secondary | ICD-10-CM | POA: Diagnosis not present

## 2015-03-27 DIAGNOSIS — I2510 Atherosclerotic heart disease of native coronary artery without angina pectoris: Secondary | ICD-10-CM | POA: Diagnosis not present

## 2015-03-27 DIAGNOSIS — I5032 Chronic diastolic (congestive) heart failure: Secondary | ICD-10-CM | POA: Diagnosis not present

## 2015-03-27 DIAGNOSIS — M353 Polymyalgia rheumatica: Secondary | ICD-10-CM | POA: Diagnosis not present

## 2015-03-27 DIAGNOSIS — J181 Lobar pneumonia, unspecified organism: Secondary | ICD-10-CM | POA: Diagnosis not present

## 2015-03-27 DIAGNOSIS — I129 Hypertensive chronic kidney disease with stage 1 through stage 4 chronic kidney disease, or unspecified chronic kidney disease: Secondary | ICD-10-CM | POA: Diagnosis not present

## 2015-03-28 DIAGNOSIS — J181 Lobar pneumonia, unspecified organism: Secondary | ICD-10-CM | POA: Diagnosis not present

## 2015-03-28 DIAGNOSIS — I129 Hypertensive chronic kidney disease with stage 1 through stage 4 chronic kidney disease, or unspecified chronic kidney disease: Secondary | ICD-10-CM | POA: Diagnosis not present

## 2015-03-28 DIAGNOSIS — N183 Chronic kidney disease, stage 3 (moderate): Secondary | ICD-10-CM | POA: Diagnosis not present

## 2015-03-28 DIAGNOSIS — M353 Polymyalgia rheumatica: Secondary | ICD-10-CM | POA: Diagnosis not present

## 2015-03-28 DIAGNOSIS — I2510 Atherosclerotic heart disease of native coronary artery without angina pectoris: Secondary | ICD-10-CM | POA: Diagnosis not present

## 2015-03-28 DIAGNOSIS — I5032 Chronic diastolic (congestive) heart failure: Secondary | ICD-10-CM | POA: Diagnosis not present

## 2015-04-01 DIAGNOSIS — I2510 Atherosclerotic heart disease of native coronary artery without angina pectoris: Secondary | ICD-10-CM | POA: Diagnosis not present

## 2015-04-01 DIAGNOSIS — M353 Polymyalgia rheumatica: Secondary | ICD-10-CM | POA: Diagnosis not present

## 2015-04-01 DIAGNOSIS — I129 Hypertensive chronic kidney disease with stage 1 through stage 4 chronic kidney disease, or unspecified chronic kidney disease: Secondary | ICD-10-CM | POA: Diagnosis not present

## 2015-04-01 DIAGNOSIS — I252 Old myocardial infarction: Secondary | ICD-10-CM | POA: Diagnosis not present

## 2015-04-01 DIAGNOSIS — Z8701 Personal history of pneumonia (recurrent): Secondary | ICD-10-CM | POA: Diagnosis not present

## 2015-04-01 DIAGNOSIS — Z951 Presence of aortocoronary bypass graft: Secondary | ICD-10-CM | POA: Diagnosis not present

## 2015-04-01 DIAGNOSIS — E43 Unspecified severe protein-calorie malnutrition: Secondary | ICD-10-CM | POA: Diagnosis not present

## 2015-04-01 DIAGNOSIS — N183 Chronic kidney disease, stage 3 (moderate): Secondary | ICD-10-CM | POA: Diagnosis not present

## 2015-04-01 DIAGNOSIS — I5032 Chronic diastolic (congestive) heart failure: Secondary | ICD-10-CM | POA: Diagnosis not present

## 2015-04-01 DIAGNOSIS — Z87891 Personal history of nicotine dependence: Secondary | ICD-10-CM | POA: Diagnosis not present

## 2015-04-03 DIAGNOSIS — M353 Polymyalgia rheumatica: Secondary | ICD-10-CM | POA: Diagnosis not present

## 2015-04-03 DIAGNOSIS — I2510 Atherosclerotic heart disease of native coronary artery without angina pectoris: Secondary | ICD-10-CM | POA: Diagnosis not present

## 2015-04-03 DIAGNOSIS — I129 Hypertensive chronic kidney disease with stage 1 through stage 4 chronic kidney disease, or unspecified chronic kidney disease: Secondary | ICD-10-CM | POA: Diagnosis not present

## 2015-04-03 DIAGNOSIS — N183 Chronic kidney disease, stage 3 (moderate): Secondary | ICD-10-CM | POA: Diagnosis not present

## 2015-04-03 DIAGNOSIS — E43 Unspecified severe protein-calorie malnutrition: Secondary | ICD-10-CM | POA: Diagnosis not present

## 2015-04-03 DIAGNOSIS — I5032 Chronic diastolic (congestive) heart failure: Secondary | ICD-10-CM | POA: Diagnosis not present

## 2015-04-11 ENCOUNTER — Inpatient Hospital Stay (HOSPITAL_COMMUNITY)
Admission: EM | Admit: 2015-04-11 | Discharge: 2015-04-13 | DRG: 194 | Disposition: A | Discharge status: Home / Self Care | Payer: Medicare Other | Attending: Internal Medicine | Admitting: Internal Medicine

## 2015-04-11 ENCOUNTER — Encounter (HOSPITAL_COMMUNITY): Payer: Self-pay | Admitting: *Deleted

## 2015-04-11 ENCOUNTER — Emergency Department (HOSPITAL_COMMUNITY): Payer: Medicare Other

## 2015-04-11 DIAGNOSIS — J9811 Atelectasis: Secondary | ICD-10-CM | POA: Diagnosis not present

## 2015-04-11 DIAGNOSIS — M549 Dorsalgia, unspecified: Secondary | ICD-10-CM | POA: Diagnosis present

## 2015-04-11 DIAGNOSIS — I5032 Chronic diastolic (congestive) heart failure: Secondary | ICD-10-CM | POA: Diagnosis present

## 2015-04-11 DIAGNOSIS — R9431 Abnormal electrocardiogram [ECG] [EKG]: Secondary | ICD-10-CM | POA: Insufficient documentation

## 2015-04-11 DIAGNOSIS — E860 Dehydration: Secondary | ICD-10-CM | POA: Diagnosis present

## 2015-04-11 DIAGNOSIS — Z87891 Personal history of nicotine dependence: Secondary | ICD-10-CM

## 2015-04-11 DIAGNOSIS — E039 Hypothyroidism, unspecified: Secondary | ICD-10-CM | POA: Diagnosis present

## 2015-04-11 DIAGNOSIS — I6529 Occlusion and stenosis of unspecified carotid artery: Secondary | ICD-10-CM | POA: Diagnosis present

## 2015-04-11 DIAGNOSIS — E780 Pure hypercholesterolemia: Secondary | ICD-10-CM | POA: Diagnosis present

## 2015-04-11 DIAGNOSIS — N179 Acute kidney failure, unspecified: Secondary | ICD-10-CM | POA: Diagnosis present

## 2015-04-11 DIAGNOSIS — M353 Polymyalgia rheumatica: Secondary | ICD-10-CM | POA: Diagnosis present

## 2015-04-11 DIAGNOSIS — Z905 Acquired absence of kidney: Secondary | ICD-10-CM | POA: Diagnosis present

## 2015-04-11 DIAGNOSIS — Z8673 Personal history of transient ischemic attack (TIA), and cerebral infarction without residual deficits: Secondary | ICD-10-CM

## 2015-04-11 DIAGNOSIS — R197 Diarrhea, unspecified: Secondary | ICD-10-CM | POA: Diagnosis present

## 2015-04-11 DIAGNOSIS — Z880 Allergy status to penicillin: Secondary | ICD-10-CM | POA: Diagnosis not present

## 2015-04-11 DIAGNOSIS — I252 Old myocardial infarction: Secondary | ICD-10-CM

## 2015-04-11 DIAGNOSIS — I447 Left bundle-branch block, unspecified: Secondary | ICD-10-CM | POA: Diagnosis present

## 2015-04-11 DIAGNOSIS — R531 Weakness: Secondary | ICD-10-CM

## 2015-04-11 DIAGNOSIS — R509 Fever, unspecified: Secondary | ICD-10-CM

## 2015-04-11 DIAGNOSIS — Z9049 Acquired absence of other specified parts of digestive tract: Secondary | ICD-10-CM | POA: Diagnosis present

## 2015-04-11 DIAGNOSIS — I2510 Atherosclerotic heart disease of native coronary artery without angina pectoris: Secondary | ICD-10-CM | POA: Diagnosis present

## 2015-04-11 DIAGNOSIS — Z79899 Other long term (current) drug therapy: Secondary | ICD-10-CM | POA: Diagnosis not present

## 2015-04-11 DIAGNOSIS — N183 Chronic kidney disease, stage 3 (moderate): Secondary | ICD-10-CM | POA: Diagnosis present

## 2015-04-11 DIAGNOSIS — R5381 Other malaise: Secondary | ICD-10-CM | POA: Diagnosis present

## 2015-04-11 DIAGNOSIS — J181 Lobar pneumonia, unspecified organism: Principal | ICD-10-CM | POA: Diagnosis present

## 2015-04-11 DIAGNOSIS — R059 Cough, unspecified: Secondary | ICD-10-CM

## 2015-04-11 DIAGNOSIS — Z951 Presence of aortocoronary bypass graft: Secondary | ICD-10-CM | POA: Diagnosis not present

## 2015-04-11 DIAGNOSIS — Z7982 Long term (current) use of aspirin: Secondary | ICD-10-CM | POA: Diagnosis not present

## 2015-04-11 DIAGNOSIS — R05 Cough: Secondary | ICD-10-CM | POA: Diagnosis not present

## 2015-04-11 DIAGNOSIS — R404 Transient alteration of awareness: Secondary | ICD-10-CM | POA: Diagnosis not present

## 2015-04-11 DIAGNOSIS — J189 Pneumonia, unspecified organism: Secondary | ICD-10-CM

## 2015-04-11 DIAGNOSIS — G8929 Other chronic pain: Secondary | ICD-10-CM | POA: Diagnosis present

## 2015-04-11 LAB — URINALYSIS, ROUTINE W REFLEX MICROSCOPIC
BILIRUBIN URINE: NEGATIVE
Glucose, UA: NEGATIVE mg/dL
Hgb urine dipstick: NEGATIVE
Ketones, ur: NEGATIVE mg/dL
LEUKOCYTES UA: NEGATIVE
NITRITE: NEGATIVE
Protein, ur: NEGATIVE mg/dL
Specific Gravity, Urine: 1.013 (ref 1.005–1.030)
Urobilinogen, UA: 0.2 mg/dL (ref 0.0–1.0)
pH: 6 (ref 5.0–8.0)

## 2015-04-11 LAB — I-STAT CG4 LACTIC ACID, ED: Lactic Acid, Venous: 0.99 mmol/L (ref 0.5–2.0)

## 2015-04-11 LAB — COMPREHENSIVE METABOLIC PANEL
ALT: 15 U/L — AB (ref 17–63)
AST: 17 U/L (ref 15–41)
Albumin: 3 g/dL — ABNORMAL LOW (ref 3.5–5.0)
Alkaline Phosphatase: 86 U/L (ref 38–126)
Anion gap: 9 (ref 5–15)
BUN: 25 mg/dL — ABNORMAL HIGH (ref 6–20)
CO2: 26 mmol/L (ref 22–32)
CREATININE: 1.48 mg/dL — AB (ref 0.61–1.24)
Calcium: 9.4 mg/dL (ref 8.9–10.3)
Chloride: 105 mmol/L (ref 101–111)
GFR calc Af Amer: 46 mL/min — ABNORMAL LOW (ref >60)
GFR calc non Af Amer: 39 mL/min — ABNORMAL LOW (ref >60)
Glucose, Bld: 110 mg/dL — ABNORMAL HIGH (ref 65–99)
Potassium: 3.6 mmol/L (ref 3.5–5.1)
SODIUM: 140 mmol/L (ref 135–145)
Total Bilirubin: 0.3 mg/dL (ref 0.3–1.2)
Total Protein: 5.9 g/dL — ABNORMAL LOW (ref 6.5–8.1)

## 2015-04-11 LAB — TROPONIN I: TROPONIN I: 0.03 ng/mL (ref <0.031)

## 2015-04-11 LAB — CBC
HCT: 43.1 % (ref 39.0–52.0)
Hemoglobin: 14 g/dL (ref 13.0–17.0)
MCH: 33.7 pg (ref 26.0–34.0)
MCHC: 32.5 g/dL (ref 30.0–36.0)
MCV: 103.9 fL — AB (ref 78.0–100.0)
Platelets: 211 10*3/uL (ref 150–400)
RBC: 4.15 MIL/uL — AB (ref 4.22–5.81)
RDW: 14.8 % (ref 11.5–15.5)
WBC: 10.9 10*3/uL — AB (ref 4.0–10.5)

## 2015-04-11 MED ORDER — ACETAMINOPHEN 500 MG PO TABS: 1000.0000 mg | ORAL_TABLET | Freq: Three times a day (TID) | ORAL | Status: DC | PRN

## 2015-04-11 MED ORDER — SODIUM CHLORIDE 0.9 % IV BOLUS (SEPSIS)
500.0000 mL | Freq: Once | INTRAVENOUS | Status: AC
  Administered 2015-04-11: 500 mL via INTRAVENOUS

## 2015-04-11 MED ORDER — ASPIRIN EC 81 MG PO TBEC
81.0000 mg | DELAYED_RELEASE_TABLET | Freq: Every day | ORAL | Status: DC
  Administered 2015-04-11 – 2015-04-13 (×3): 81 mg via ORAL
  Filled 2015-04-11 (×3): qty 1

## 2015-04-11 MED ORDER — LEVOTHYROXINE SODIUM 88 MCG PO TABS
88.0000 ug | ORAL_TABLET | Freq: Every day | ORAL | Status: DC
  Administered 2015-04-12 – 2015-04-13 (×2): 88 ug via ORAL
  Filled 2015-04-11 (×2): qty 1

## 2015-04-11 MED ORDER — FOLIC ACID 1 MG PO TABS
1.0000 mg | ORAL_TABLET | Freq: Every day | ORAL | Status: DC
  Administered 2015-04-11 – 2015-04-13 (×3): 1 mg via ORAL
  Filled 2015-04-11 (×3): qty 1

## 2015-04-11 MED ORDER — SODIUM CHLORIDE 0.9 % IV SOLN
INTRAVENOUS | Status: AC
  Administered 2015-04-11: 17:00:00 via INTRAVENOUS

## 2015-04-11 MED ORDER — DOXAZOSIN MESYLATE 4 MG PO TABS
4.0000 mg | ORAL_TABLET | Freq: Every day | ORAL | Status: DC
  Administered 2015-04-11 – 2015-04-13 (×3): 4 mg via ORAL
  Filled 2015-04-11 (×3): qty 1

## 2015-04-11 MED ORDER — DEXTROSE 5 % IV SOLN
500.0000 mg | Freq: Once | INTRAVENOUS | Status: AC
  Administered 2015-04-11: 500 mg via INTRAVENOUS
  Filled 2015-04-11: qty 500

## 2015-04-11 MED ORDER — METHOTREXATE (ANTI-RHEUMATIC) 2.5 MG PO TABS
10.0000 mg | ORAL_TABLET | ORAL | Status: DC
  Administered 2015-04-11: 10 mg via ORAL
  Filled 2015-04-11: qty 4

## 2015-04-11 MED ORDER — CEFTRIAXONE SODIUM IN DEXTROSE 20 MG/ML IV SOLN
1.0000 g | INTRAVENOUS | Status: DC
  Administered 2015-04-12: 1 g via INTRAVENOUS
  Filled 2015-04-11 (×2): qty 50

## 2015-04-11 MED ORDER — DEXTROSE 5 % IV SOLN: 500.0000 mg | INTRAVENOUS | Status: DC

## 2015-04-11 MED ORDER — GABAPENTIN 300 MG PO CAPS
300.0000 mg | ORAL_CAPSULE | Freq: Three times a day (TID) | ORAL | Status: DC
  Administered 2015-04-11 – 2015-04-13 (×4): 300 mg via ORAL
  Filled 2015-04-11 (×6): qty 1

## 2015-04-11 MED ORDER — DEXTROSE 5 % IV SOLN
1.0000 g | Freq: Once | INTRAVENOUS | Status: AC
  Administered 2015-04-11: 1 g via INTRAVENOUS
  Filled 2015-04-11: qty 10

## 2015-04-11 MED ORDER — CLOPIDOGREL BISULFATE 75 MG PO TABS
75.0000 mg | ORAL_TABLET | Freq: Every day | ORAL | Status: DC
  Administered 2015-04-11 – 2015-04-13 (×3): 75 mg via ORAL
  Filled 2015-04-11 (×3): qty 1

## 2015-04-11 MED ORDER — LEVALBUTEROL HCL 1.25 MG/0.5ML IN NEBU
1.2500 mg | INHALATION_SOLUTION | Freq: Four times a day (QID) | RESPIRATORY_TRACT | Status: DC | PRN
  Filled 2015-04-11: qty 0.5

## 2015-04-11 MED ORDER — PANTOPRAZOLE SODIUM 40 MG PO TBEC
40.0000 mg | DELAYED_RELEASE_TABLET | Freq: Every day | ORAL | Status: DC
  Administered 2015-04-11 – 2015-04-13 (×3): 40 mg via ORAL
  Filled 2015-04-11 (×3): qty 1

## 2015-04-11 MED ORDER — ENOXAPARIN SODIUM 40 MG/0.4ML ~~LOC~~ SOLN
40.0000 mg | SUBCUTANEOUS | Status: DC
  Administered 2015-04-11 – 2015-04-12 (×2): 40 mg via SUBCUTANEOUS
  Filled 2015-04-11 (×2): qty 0.4

## 2015-04-11 MED ORDER — PREDNISONE 5 MG PO TABS
10.0000 mg | ORAL_TABLET | Freq: Every day | ORAL | Status: DC
  Administered 2015-04-12 – 2015-04-13 (×2): 10 mg via ORAL
  Filled 2015-04-11 (×2): qty 2

## 2015-04-11 MED ORDER — SODIUM CHLORIDE 0.9 % IV SOLN
INTRAVENOUS | Status: DC
  Administered 2015-04-11 – 2015-04-12 (×3): via INTRAVENOUS

## 2015-04-11 NOTE — ED Notes (Signed)
Urinal placed between pts legs in order to obtain urine specimen

## 2015-04-11 NOTE — ED Notes (Signed)
Social work is in room

## 2015-04-11 NOTE — ED Notes (Signed)
Offered pt fluids, pt went back to sleep with no answer regarding fluids.

## 2015-04-11 NOTE — ED Notes (Signed)
Patient is resting at this time. Po fluids offered but refused. IVF infusing

## 2015-04-11 NOTE — Progress Notes (Addendum)
ED CM consulted by ED SW after ED RN spoke with her about pt. ED SW was informed by wife taht she wanted information about other home health/PDN services CM spoke with wife who informed cm she only wanted "for the nurses to keep him warm tonight" , "they been running in and out of here all day and I just want him covered up" Cm reviewed consult for home services and offered lists of PDN and home health agencies for [facility] but they were refused by wife, given back to CM by wife and pt remained silent. Wife states pt is to be admitted  Cm spoke with pt and he conversed and stated sometimes its best to "remain quiet" Male at bedside reports pt is "not normally quiet"  [facility] admission RN came to see pt as Cm was leaving   Pt receives services from [employer] per wife

## 2015-04-11 NOTE — ED Notes (Signed)
Patient lives at home with wife. [facility] aide coming out to home to assist patient with ADLS d/t weakness in BLE. Aide reported patient has been weaker today and has not been eating much. Patient reports he was "sick" last night (stomach upset). Pt reports stomach is better today after taking pepto bismol. Patient feels he has no complaints

## 2015-04-11 NOTE — ED Notes (Signed)
Patient unable to provide urine sample at this time. Will f/u with provider

## 2015-04-11 NOTE — H&P (Signed)
[name] History and Physical  [name]:[id number] DOB: Oct 21, 1920 DOA: 04/11/2015  Referring physician: EDP PCP: Mathews Argyle, MD   Chief Complaint: generalized weakness  HPI: Barry Wright is a 81 y.o. male with h/o CAD, CKD, hypothyroidism, was brought in for generalized weakness. On arrival he was found to be febrile at 100.9, and coughing yellow green sputum. As per the aid, patient had diarrhea on Sunday and today and had complained of abdominal pain . He hasn't vomiting. Patient confused and most of the history available from the health aid at bedside. His CXR shows some atelectasis on the left side . UA is clear. He is referred to medical service for admission.    Review of Systems:  Confused, could not be obtained.   Past Medical History  Diagnosis Date  . Chronic back pain   . Hypercholesteremia   . Hypothyroidism   . CAD (coronary artery disease)   . Chronic kidney disease (CKD), stage III (moderate)   . Polymyalgia rheumatica   . Left bundle branch block   . Hx of CABG   . History of nephrectomy   . Internal carotid artery stenosis   . Myocardial infarction   . CHF (congestive heart failure)   . Stroke    Past Surgical History  Procedure Laterality Date  . Coronary artery bypass graft    . Cholecystectomy    . Kidney surgery      kidney removed   Social History:  reports that he quit smoking about 44 years ago. His smoking use included Cigarettes. He has never used smokeless tobacco. He reports that he does not drink alcohol or use illicit drugs.  Allergies  Allergen Reactions  . Penicillins Rash    Family History  Problem Relation Age of Onset  . Other Mother     [place] Flu  . Heart disease Father     Prior to Admission medications   Medication Sig Start Date End Date Taking? Authorizing Provider  acetaminophen (TYLENOL) 500 MG tablet Take 1,000 mg by mouth every 8 (eight) hours as needed for pain.   Yes Historical  Provider, MD  aspirin 81 MG tablet Take 81 mg by mouth daily.   Yes Historical Provider, MD  atorvastatin (LIPITOR) 20 MG tablet Take 20 mg by mouth every morning.    Yes Historical Provider, MD  calcium carbonate (OS-CAL - DOSED IN MG OF ELEMENTAL CALCIUM) 1250 MG tablet Take 1 tablet by mouth daily with breakfast.   Yes Historical Provider, MD  fexofenadine (ALLEGRA) 180 MG tablet Take 180 mg by mouth daily.   Yes Historical Provider, MD  folic acid (FOLVITE) 1 MG tablet Take 1 mg by mouth daily.   Yes Historical Provider, MD  furosemide (LASIX) 20 MG tablet Take 0.5 tablets (10 mg total) by mouth daily. 01/30/15  Yes Theodis Blaze, MD  gabapentin (NEURONTIN) 300 MG capsule Take 300 mg by mouth 3 (three) times daily.   Yes Historical Provider, MD  guaiFENesin (MUCINEX) 600 MG 12 hr tablet Take 1 tablet (600 mg total) by mouth 2 (two) times daily. Patient taking differently: Take 600 mg by mouth 2 (two) times daily as needed for cough or to loosen phlegm.  01/30/15  Yes Theodis Blaze, MD  levalbuterol Penne Lash) 1.25 MG/0.5ML nebulizer solution Take 1.25 mg by nebulization every 6 (six) hours as needed for wheezing or shortness of breath. 01/30/15  Yes Theodis Blaze, MD  levothyroxine (SYNTHROID, LEVOTHROID) 88 MCG tablet  Take 88 mcg by mouth daily before breakfast.   Yes Historical Provider, MD  methotrexate (RHEUMATREX) 2.5 MG tablet Take 10 mg by mouth every Wednesday.   Yes Historical Provider, MD  Multiple Vitamins-Minerals (MULTIVITAMINS THER. W/MINERALS) TABS Take 1 tablet by mouth daily.     Yes Historical Provider, MD  nitroGLYCERIN (NITROSTAT) 0.4 MG SL tablet Place 0.4 mg under the tongue every 5 (five) minutes as needed for chest pain.   Yes Historical Provider, MD  omeprazole (PRILOSEC) 20 MG capsule Take 20 mg by mouth daily.   Yes Historical Provider, MD  ondansetron (ZOFRAN) 4 MG tablet Take 4 mg by mouth every 8 (eight) hours as needed for nausea or vomiting.   Yes Historical Provider,  MD  polyethylene glycol (MIRALAX / GLYCOLAX) packet Take 17 g by mouth daily as needed for mild constipation.    Yes Historical Provider, MD  predniSONE (DELTASONE) 5 MG tablet Take 5-10 mg by mouth daily with breakfast.    Yes Historical Provider, MD  traMADol (ULTRAM) 50 MG tablet Take one tablet by mouth twice daily for pains Patient taking differently: Take 50 mg by mouth every 12 (twelve) hours as needed for moderate pain.  03/06/14  Yes Tiffany L Reed, DO  amLODipine (NORVASC) 2.5 MG tablet Take 2.5 mg by mouth daily.    Historical Provider, MD  clopidogrel (PLAVIX) 75 MG tablet Take 75 mg by mouth daily.      Historical Provider, MD  doxazosin (CARDURA) 4 MG tablet Take 4 mg by mouth daily.     Historical Provider, MD  levofloxacin (LEVAQUIN) 750 MG tablet Take 1 tablet (750 mg total) by mouth every other day. Patient not taking: Reported on 04/11/2015 01/30/15   Theodis Blaze, MD   Physical Exam: Filed Vitals:   04/11/15 1129 04/11/15 1341 04/11/15 1545 04/11/15 1744  BP: 116/64 143/69 124/52 130/71  Pulse: 87 81 77 67  Temp: 98.4 F (36.9 C)  100.9 F (38.3 C)   TempSrc: Oral  Rectal   Resp: 16 20 25 20   SpO2: 94% 94% 92% 95%    Wt Readings from Last 3 Encounters:  01/30/15 79.4 kg (175 lb 0.7 oz)  10/31/14 84.369 kg (186 lb)  06/19/14 84.369 kg (186 lb)    General:  Appears calm and comfortable Eyes: PERRL, normal lids, irises & conjunctivA Neck: no LAD, masses or thyromegaly Cardiovascular: RRR, no m/r/g. No LE edema. Respiratory: diminished at bases.  no w/r/r. Normal respiratory effort. Abdomen: soft, ntnd Skin: no rash or induration seen on limited exam Musculoskeletal: grossly normal tone BUE/BLE Neurologic: ALERT afebrile , confused, speech normal.           Labs on Admission:  Basic Metabolic Panel:  Recent Labs Lab 04/11/15 1218  NA 140  K 3.6  CL 105  CO2 26  GLUCOSE 110*  BUN 25*  CREATININE 1.48*  CALCIUM 9.4   Liver Function Tests:  Recent  Labs Lab 04/11/15 1218  AST 17  ALT 15*  ALKPHOS 86  BILITOT 0.3  PROT 5.9*  ALBUMIN 3.0*   No results for input(s): LIPASE, AMYLASE in the last 168 hours. No results for input(s): AMMONIA in the last 168 hours. CBC:  Recent Labs Lab 04/11/15 1218  WBC 10.9*  HGB 14.0  HCT 43.1  MCV 103.9*  PLT 211   Cardiac Enzymes:  Recent Labs Lab 04/11/15 1218  TROPONINI 0.03    BNP (last 3 results) No results for input(s): BNP in the  last 8760 hours.  ProBNP (last 3 results) No results for input(s): PROBNP in the last 8760 hours.  CBG: No results for input(s): GLUCAP in the last 168 hours.  Radiological Exams on Admission: Dg Chest 2 View  04/11/2015   CLINICAL DATA:  Progressive weakness.  EXAM: CHEST  2 VIEW  COMPARISON:  02/06/2015 and 01/28/2015  FINDINGS: Heart size and pulmonary vascularity are normal. Slight atelectasis at the left lung base. CABG. No effusions. No acute osseous abnormality.  IMPRESSION: Slight atelectasis at the left base.   Electronically Signed   By: Lorriane Shire M.D.   On: 04/11/2015 13:28    EKG:  Reviewed sinus with prolonged PR interval.   Assessment/Plan Active Problems:   CAP (community acquired pneumonia)   Weakness  FEVER and productive cough, Suspected CAP: Admitted to telemetry and started on rocephin and zithromax.  - got blood cultures , ordered sputum cultures.  - he has good oxygen sats on RA.    Generalized weakness; physical deconditioning with infection.  Get PT eval.   CAD; denies any chest pain.  Resume aspirin and plavix and initial troponin negative. Do not plan to repeat troponins.    Hypothyroidism; Get TSH and resume synthroid.    Diarrhea : Get c diff pcr.   Stage 3 CKD: Appears t obe stable.   Mild leukocytosis: Probably fro m infection.    Code Status: presumed full code.  DVT Prophylaxis: Family Communication: wife at bedside.  Disposition Plan: pending PT eval. patien'ts wife wants to take  him home on discharge.   Time spent: 65 min  [facility] Pager [phone]

## 2015-04-11 NOTE — Discharge Instructions (Signed)
It was our pleasure to provide your ER care today - we hope that you feel better.  Rest. Drink plenty of fluids.  Follow up with your primary care doctor for recheck in the next few days if symptoms fail to improve/resolve.  Return to ER if worse, new symptoms, fevers, trouble breathing, vomiting, new or worsening cough, abdominal pain, other concern.     Weakness Weakness is a lack of strength. It may be felt all over the body (generalized) or in one specific part of the body (focal). Some causes of weakness can be serious. You may need further medical evaluation, especially if you are elderly or you have a history of immunosuppression (such as chemotherapy or HIV), kidney disease, heart disease, or diabetes. CAUSES  Weakness can be caused by many different things, including:  Infection.  Physical exhaustion.  Internal bleeding or other blood loss that results in a lack of red blood cells (anemia).  Dehydration. This cause is more common in elderly people.  Side effects or electrolyte abnormalities from medicines, such as pain medicines or sedatives.  Emotional distress, anxiety, or depression.  Circulation problems, especially severe peripheral arterial disease.  Heart disease, such as rapid atrial fibrillation, bradycardia, or heart failure.  Nervous system disorders, such as Guillain-Barr syndrome, multiple sclerosis, or stroke. DIAGNOSIS  To find the cause of your weakness, your caregiver will take your history and perform a physical exam. Lab tests or X-rays may also be ordered, if needed. TREATMENT  Treatment of weakness depends on the cause of your symptoms and can vary greatly. HOME CARE INSTRUCTIONS   Rest as needed.  Eat a well-balanced diet.  Try to get some exercise every day.  Only take over-the-counter or prescription medicines as directed by your caregiver. SEEK MEDICAL CARE IF:   Your weakness seems to be getting worse or spreads to other parts of  your body.  You develop new aches or pains. SEEK IMMEDIATE MEDICAL CARE IF:   You cannot perform your normal daily activities, such as getting dressed and feeding yourself.  You cannot walk up and down stairs, or you feel exhausted when you do so.  You have shortness of breath or chest pain.  You have difficulty moving parts of your body.  You have weakness in only one area of the body or on only one side of the body.  You have a fever.  You have trouble speaking or swallowing.  You cannot control your bladder or bowel movements.  You have black or bloody vomit or stools. MAKE SURE YOU:  Understand these instructions.  Will watch your condition.  Will get help right away if you are not doing well or get worse. Document Released: 10/27/2005 Document Revised: 04/27/2012 Document Reviewed: 12/26/2011 [facility] Patient Information 2015 [place], [place]. This information is not intended to replace advice given to you by your health care provider. Make sure you discuss any questions you have with your health care provider.

## 2015-04-11 NOTE — ED Notes (Signed)
Patient reports he is wheelchair bound d/t weakness in LLE. Pt is A&O to self, situation. He has no complaints at this time. He denies blurred vision, dizziness and or numbness.

## 2015-04-11 NOTE — ED Provider Notes (Addendum)
CSN: [id number]     Arrival date & time 04/11/15  1121 History   First MD Initiated Contact with Patient 04/11/15 1143     Chief Complaint  Patient presents with  . Fatigue     (Consider location/radiation/quality/duration/timing/severity/associated sxs/prior Treatment) The history is provided by the patient.  Patient noted by home health worker to appear generally weaker this morning than baseline, so sent to ED for evaluation.  Patient limited historian - level 5 caveat.  Pt denies any specific c/o. Pt states he may have been generally weak earlier, but denies any focal or unilateral numbness, or weakness. No headache. No fever or chills. No cough, sore throat, or other uri c/o. No chest pain. No sob. No abd pain. No nvd. No dysuria or gu c/o. Denies recent change in meds. No trauma or fall, at baseline in chair/wheelchair.     Past Medical History  Diagnosis Date  . Chronic back pain   . Hypercholesteremia   . Hypothyroidism   . CAD (coronary artery disease)   . Chronic kidney disease (CKD), stage III (moderate)   . Polymyalgia rheumatica   . Left bundle branch block   . Hx of CABG   . History of nephrectomy   . Internal carotid artery stenosis   . Myocardial infarction   . CHF (congestive heart failure)   . Stroke    Past Surgical History  Procedure Laterality Date  . Coronary artery bypass graft    . Cholecystectomy    . Kidney surgery      kidney removed   Family History  Problem Relation Age of Onset  . Other Mother     [place] Flu  . Heart disease Father    History  Substance Use Topics  . Smoking status: Former Smoker    Types: Cigarettes    Quit date: 11/10/1970  . Smokeless tobacco: Never Used  . Alcohol Use: No    Review of Systems  Constitutional: Negative for fever and chills.  HENT: Negative for sore throat.   Eyes: Negative for visual disturbance.  Respiratory: Negative for cough and shortness of breath.   Cardiovascular: Negative for chest  pain.  Gastrointestinal: Negative for vomiting, abdominal pain and diarrhea.  Endocrine: Negative for polyuria.  Genitourinary: Negative for dysuria and flank pain.  Musculoskeletal: Negative for back pain and neck pain.  Skin: Negative for rash.  Neurological: Negative for syncope, speech difficulty, numbness and headaches.  Hematological: Does not bruise/bleed easily.  Psychiatric/Behavioral: Negative for confusion.      Allergies  Penicillins  Home Medications   Prior to Admission medications   Medication Sig Start Date End Date Taking? Authorizing Provider  acetaminophen (TYLENOL) 500 MG tablet Take 1,000 mg by mouth every 8 (eight) hours as needed for pain.    Historical Provider, MD  amLODipine (NORVASC) 2.5 MG tablet Take 2.5 mg by mouth daily.    Historical Provider, MD  aspirin 81 MG tablet Take 81 mg by mouth daily.    Historical Provider, MD  atorvastatin (LIPITOR) 20 MG tablet Take 20 mg by mouth every morning.     Historical Provider, MD  calcium carbonate (OS-CAL - DOSED IN MG OF ELEMENTAL CALCIUM) 1250 MG tablet Take 1 tablet by mouth daily with breakfast.    Historical Provider, MD  clopidogrel (PLAVIX) 75 MG tablet Take 75 mg by mouth daily.      Historical Provider, MD  doxazosin (CARDURA) 4 MG tablet Take 4 mg by mouth daily.  Historical Provider, MD  fexofenadine (ALLEGRA) 180 MG tablet Take 180 mg by mouth daily.    Historical Provider, MD  folic acid (FOLVITE) 1 MG tablet Take 1 mg by mouth daily.    Historical Provider, MD  furosemide (LASIX) 20 MG tablet Take 0.5 tablets (10 mg total) by mouth daily. 01/30/15   Theodis Blaze, MD  gabapentin (NEURONTIN) 300 MG capsule Take 300 mg by mouth 3 (three) times daily.    Historical Provider, MD  guaiFENesin (MUCINEX) 600 MG 12 hr tablet Take 1 tablet (600 mg total) by mouth 2 (two) times daily. 01/30/15   Theodis Blaze, MD  levalbuterol Penne Lash) 1.25 MG/0.5ML nebulizer solution Take 1.25 mg by nebulization every 6  (six) hours as needed for wheezing or shortness of breath. 01/30/15   Theodis Blaze, MD  levofloxacin (LEVAQUIN) 750 MG tablet Take 1 tablet (750 mg total) by mouth every other day. 01/30/15   Theodis Blaze, MD  levothyroxine (SYNTHROID, LEVOTHROID) 75 MCG tablet Take 75 mcg by mouth daily.    Historical Provider, MD  methotrexate (RHEUMATREX) 2.5 MG tablet Take 10 mg by mouth every Wednesday.    Historical Provider, MD  Multiple Vitamins-Minerals (MULTIVITAMINS THER. W/MINERALS) TABS Take 1 tablet by mouth daily.      Historical Provider, MD  nitroGLYCERIN (NITROSTAT) 0.4 MG SL tablet Place 0.4 mg under the tongue every 5 (five) minutes as needed for chest pain.    Historical Provider, MD  omeprazole (PRILOSEC) 20 MG capsule Take 20 mg by mouth daily.    Historical Provider, MD  ondansetron (ZOFRAN) 4 MG tablet Take 4 mg by mouth every 8 (eight) hours as needed for nausea or vomiting.    Historical Provider, MD  polyethylene glycol (MIRALAX / GLYCOLAX) packet Take 17 g by mouth daily.     Historical Provider, MD  predniSONE (DELTASONE) 5 MG tablet Take 10 mg by mouth daily with breakfast.     Historical Provider, MD  traMADol (ULTRAM) 50 MG tablet Take one tablet by mouth twice daily for pains Patient taking differently: Take 50 mg by mouth every 12 (twelve) hours as needed for moderate pain.  03/06/14   Tiffany L Reed, DO   BP 116/64 mmHg  Pulse 87  Temp(Src) 98.4 F (36.9 C) (Oral)  Resp 16  SpO2 94% Physical Exam  Constitutional: He appears well-developed and well-nourished. No distress.  HENT:  Head: Atraumatic.  Mouth/Throat: Oropharynx is clear and moist.  Eyes: Conjunctivae are normal. Pupils are equal, round, and reactive to light. No scleral icterus.  Neck: Neck supple. No tracheal deviation present.  Cardiovascular: Normal rate, regular rhythm, normal heart sounds and intact distal pulses.   Pulmonary/Chest: Effort normal and breath sounds normal. No accessory muscle usage. No  respiratory distress.  Abdominal: Soft. Bowel sounds are normal. He exhibits no distension and no mass. There is no tenderness. There is no rebound and no guarding.  Genitourinary:  No cva tenderness  Musculoskeletal: Normal range of motion. He exhibits no edema or tenderness.  Neurological: He is alert. No cranial nerve deficit.  Alert, oriented to person, place, day. Motor intact bil, sens grossly intact.   Skin: Skin is warm and dry. He is not diaphoretic.  Psychiatric: He has a normal mood and affect.  Nursing note and vitals reviewed.   ED Course  Procedures (including critical care time) Labs Review  Results for orders placed or performed during the hospital encounter of 04/11/15  CBC  Result Value Ref  Range   WBC 10.9 (H) 4.0 - 10.5 K/uL   RBC 4.15 (L) 4.22 - 5.81 MIL/uL   Hemoglobin 14.0 13.0 - 17.0 g/dL   HCT 43.1 39.0 - 52.0 %   MCV 103.9 (H) 78.0 - 100.0 fL   MCH 33.7 26.0 - 34.0 pg   MCHC 32.5 30.0 - 36.0 g/dL   RDW 14.8 11.5 - 15.5 %   Platelets 211 150 - 400 K/uL  Comprehensive metabolic panel  Result Value Ref Range   Sodium 140 135 - 145 mmol/L   Potassium 3.6 3.5 - 5.1 mmol/L   Chloride 105 101 - 111 mmol/L   CO2 26 22 - 32 mmol/L   Glucose, Bld 110 (H) 65 - 99 mg/dL   BUN 25 (H) 6 - 20 mg/dL   Creatinine, Ser 1.48 (H) 0.61 - 1.24 mg/dL   Calcium 9.4 8.9 - 10.3 mg/dL   Total Protein 5.9 (L) 6.5 - 8.1 g/dL   Albumin 3.0 (L) 3.5 - 5.0 g/dL   AST 17 15 - 41 U/L   ALT 15 (L) 17 - 63 U/L   Alkaline Phosphatase 86 38 - 126 U/L   Total Bilirubin 0.3 0.3 - 1.2 mg/dL   GFR calc non Af Amer 39 (L) >60 mL/min   GFR calc Af Amer 46 (L) >60 mL/min   Anion gap 9 5 - 15  Urinalysis, Routine w reflex microscopic (not at [facility])  Result Value Ref Range   Color, Urine YELLOW YELLOW   APPearance CLEAR CLEAR   Specific Gravity, Urine 1.013 1.005 - 1.030   pH 6.0 5.0 - 8.0   Glucose, UA NEGATIVE NEGATIVE mg/dL   Hgb urine dipstick NEGATIVE NEGATIVE   Bilirubin Urine  NEGATIVE NEGATIVE   Ketones, ur NEGATIVE NEGATIVE mg/dL   Protein, ur NEGATIVE NEGATIVE mg/dL   Urobilinogen, UA 0.2 0.0 - 1.0 mg/dL   Nitrite NEGATIVE NEGATIVE   Leukocytes, UA NEGATIVE NEGATIVE  Troponin I  Result Value Ref Range   Troponin I 0.03 <0.031 ng/mL   Dg Chest 2 View  04/11/2015   CLINICAL DATA:  Progressive weakness.  EXAM: CHEST  2 VIEW  COMPARISON:  02/06/2015 and 01/28/2015  FINDINGS: Heart size and pulmonary vascularity are normal. Slight atelectasis at the left lung base. CABG. No effusions. No acute osseous abnormality.  IMPRESSION: Slight atelectasis at the left base.   Electronically Signed   By: Lorriane Shire M.D.   On: 04/11/2015 13:28       EKG Interpretation   Date/Time:  Wednesday April 11 2015 12:06:45 EDT Ventricular Rate:  82 PR Interval:  255 QRS Duration: 163 QT Interval:  467 QTC Calculation: 545 R Axis:   -98 Text Interpretation:  Sinus rhythm Prolonged PR interval Right bundle  branch block No significant change since last tracing Confirmed by Muna Demers   MD, Kanin Lia ([id number]) on 04/11/2015 12:24:09 PM      MDM   Iv ns. Labs.  Reviewed nursing notes and prior charts for additional history.   Recheck pt, pt denies any c/o.  ivf given in ED. Pt tolerating po fluids.  Pt afeb. Denies any pain. No sob. No abd pain. No nv.  Pt denies fever, chills or sweats.  On subsequent recheck, pt feels warm. Repeat temp/rectal temp 100.9.  Pt denies any new c/o. Spouse/caregiver arrive - they do note recent cough, and inc gen weakness, seems shaky when stands for transfer which is unusual for him. Minimal atelectasis on cxr. As no other apparent  source fever, cultures sent, rx for possible pna.   On back/sacral exam, minimal erythema sacral area, no skin breakdown. No neck stiffness or rigidity. No headache. abd soft nt.  Given increased weakness, fever, will admit/obs. Hospitalists consulted for admission.      Lajean Saver, MD 04/11/15 [phone]

## 2015-04-11 NOTE — Progress Notes (Signed)
CSW met with pt at bedside. Wife was present. Per note, pt presents to [facility] due to fatigue. Patient confirms that he lives at home in [place] with his wife. Wife states that the pt needs assistance with completing his ADL's.  Patient states that he does not fall often. He informed CSW that he has not fallen within the past 6 months.  Wife states that the pt's homoe health agency wanted the pt to come into the hospital to be evaluated. Wife stated " Our agency that we are working with... They think that every time you stomp your toe, you have to come to the hospital."  Wife informed CSW that their home health agency provides their services from 9:00am to 9:00pm 7 days a week. Wife and pt state that they are not interested in going to a facility. Wife informed CSW that she likes home health. However, she states that she does not particularly like the home health agency that they have now.  CSW consulted with Nurse CM who states that she will give the pt and wife a list of other home health options.    Willette Brace [phone] ED CSW 04/11/2015 5:48 PM

## 2015-04-12 DIAGNOSIS — R531 Weakness: Secondary | ICD-10-CM

## 2015-04-12 DIAGNOSIS — R5381 Other malaise: Secondary | ICD-10-CM

## 2015-04-12 DIAGNOSIS — J181 Lobar pneumonia, unspecified organism: Principal | ICD-10-CM

## 2015-04-12 DIAGNOSIS — I4581 Long QT syndrome: Secondary | ICD-10-CM

## 2015-04-12 DIAGNOSIS — I5032 Chronic diastolic (congestive) heart failure: Secondary | ICD-10-CM

## 2015-04-12 LAB — BASIC METABOLIC PANEL
Anion gap: 9 (ref 5–15)
BUN: 22 mg/dL — AB (ref 6–20)
CO2: 26 mmol/L (ref 22–32)
Calcium: 8.9 mg/dL (ref 8.9–10.3)
Chloride: 106 mmol/L (ref 101–111)
Creatinine, Ser: 1.14 mg/dL (ref 0.61–1.24)
GFR, EST NON AFRICAN AMERICAN: 54 mL/min — AB (ref >60)
Glucose, Bld: 108 mg/dL — ABNORMAL HIGH (ref 65–99)
Potassium: 3.8 mmol/L (ref 3.5–5.1)
SODIUM: 141 mmol/L (ref 135–145)

## 2015-04-12 LAB — STREP PNEUMONIAE URINARY ANTIGEN: Strep Pneumo Urinary Antigen: NEGATIVE

## 2015-04-12 LAB — CLOSTRIDIUM DIFFICILE BY PCR: CDIFFPCR: NEGATIVE

## 2015-04-12 LAB — CBC
HCT: 38.5 % — ABNORMAL LOW (ref 39.0–52.0)
Hemoglobin: 12.4 g/dL — ABNORMAL LOW (ref 13.0–17.0)
MCH: 33.1 pg (ref 26.0–34.0)
MCHC: 32.2 g/dL (ref 30.0–36.0)
MCV: 102.7 fL — ABNORMAL HIGH (ref 78.0–100.0)
Platelets: 219 10*3/uL (ref 150–400)
RBC: 3.75 MIL/uL — ABNORMAL LOW (ref 4.22–5.81)
RDW: 14.9 % (ref 11.5–15.5)
WBC: 9.6 10*3/uL (ref 4.0–10.5)

## 2015-04-12 LAB — LEGIONELLA ANTIGEN, URINE

## 2015-04-12 LAB — TSH: TSH: 3.2 u[IU]/mL (ref 0.350–4.500)

## 2015-04-12 MED ORDER — DOXYCYCLINE HYCLATE 100 MG PO TABS
100.0000 mg | ORAL_TABLET | Freq: Two times a day (BID) | ORAL | Status: DC
  Administered 2015-04-12 – 2015-04-13 (×3): 100 mg via ORAL
  Filled 2015-04-12 (×3): qty 1

## 2015-04-12 NOTE — Evaluation (Signed)
Physical Therapy Evaluation Patient Details Name: Barry Wright MRN: [id number] DOB: 1920/02/24 Today's Date: 04/12/2015   History of Present Illness  81 yo male admitted with weakness. hx of CVA, CHF, MI, CABG, polymyalgia rheumatica, LBBB, chronic back pain. Pt is from home with wife and caregivers during day  Clinical Impression  On eval, pt required Max assist +2 for attempt at standing-pt unable to achieve full standing position, Mod assist +2 for lateral scoot from bed to recliner. Pt is generally weak and fatigues easily. Also with R LE weaker than L LE-at baseline. Discussed d/c plan-pt declines SNF placement. He prefers to return home to resume HHPT with continued help of caregivers.     Follow Up Recommendations Home health PT;Supervision/Assistance - 24 hour (pt declines SNF placement)    Equipment Recommendations   Per pt, he does not want a hospital bed.  Hoyer lift could be beneficial.    Recommendations for Other Services       Precautions / Restrictions Precautions Precautions: Fall Restrictions Weight Bearing Restrictions: No      Mobility  Bed Mobility Overal bed mobility: Needs Assistance Bed Mobility: Supine to Sit     Supine to sit: HOB elevated;Mod assist     General bed mobility comments: Assist for LEs and trunk. Increased time. Utilized bedpad for scooting, positioning.   Transfers Overall transfer level: Needs assistance Equipment used: Rolling walker (2 wheeled) Transfers: Lateral/Scoot Transfers;Sit to/from Stand Sit to Stand: Max assist;+2 physical assistance;+2 safety/equipment;From elevated surface         General transfer comment: Attempted sit to stand x2-once with +1, then with +2-only able to stand partially with +2 assist. Lateral scoot from bed to recliner (with armrest down)-Mod assist +2 with use of bedpad to aid in scooting. Increased time. Pt fatigues easily.   Ambulation/Gait             General Gait Details: NT-pt  nonambulatory  Stairs            Wheelchair Mobility    Modified Rankin (Stroke Patients Only)       Balance                                             Pertinent Vitals/Pain Pain Assessment: Faces Faces Pain Scale: Hurts little more Pain Location: chronic neck pain Pain Intervention(s): Monitored during session    Home Living Family/patient expects to be discharged to:: Private residence Living Arrangements: Spouse/significant other Available Help at Discharge: Personal care attendant (caregivers 9-9) Type of Home: House Home Access: Ramped entrance     Home Layout: Able to live on main level with bedroom/bathroom Home Equipment: Walker - 2 wheels;Bedside commode;Shower seat;Grab bars - toilet;Grab bars - tub/shower;Wheelchair - manual (lift chair)      Prior Function Level of Independence: Needs assistance   Gait / Transfers Assistance Needed: non ambulatory. Varying level of performance at baseline per aide: can sometimes pivot from lift chair to [place], sometimes he cannot           Hand Dominance        Extremity/Trunk Assessment   Upper Extremity Assessment: Generalized weakness           Lower Extremity Assessment: RLE deficits/detail;LLE deficits/detail RLE Deficits / Details: 3-/5 knee ext LLE Deficits / Details: 3/5 knee ext  Cervical / Trunk Assessment: Kyphotic  Communication  Communication: HOH  Cognition Arousal/Alertness: Awake/alert Behavior During Therapy: WFL for tasks assessed/performed Overall Cognitive Status: Within Functional Limits for tasks assessed                      General Comments      Exercises        Assessment/Plan    PT Assessment Patient needs continued PT services  PT Diagnosis Generalized weakness   PT Problem List Decreased strength;Decreased range of motion;Decreased activity tolerance;Decreased balance;Decreased mobility  PT Treatment Interventions Functional mobility  training;Therapeutic activities;Therapeutic exercise;Patient/family education;DME instruction;Balance training   PT Goals (Current goals can be found in the Care Plan section) Acute Rehab PT Goals Patient Stated Goal: home to resume HHPT PT Goal Formulation: With patient/family Time For Goal Achievement: 04/26/15 Potential to Achieve Goals: Poor    Frequency Min 3X/week   Barriers to discharge        Co-evaluation               End of Session Equipment Utilized During Treatment: Gait belt Activity Tolerance: Patient limited by fatigue Patient left: in chair;with call bell/phone within reach;with family/visitor present (informed Rn that pt would likely be a hoyer lift back to bed and that lift pad would need to be placed under him)           Time: 1453-1537 PT Time Calculation (min) (ACUTE ONLY): 44 min   Charges:   PT Evaluation $Initial PT Evaluation Tier I: 1 Procedure PT Treatments $Therapeutic Activity: 23-37 mins   PT G Codes:        Weston Anna, MPT Pager: [phone]

## 2015-04-12 NOTE — Progress Notes (Signed)
[facility]  Patient Status: Active (receiving services up to time of hospitalization)  [facility] is providing the following services: RN and PT  If patient discharges after hours, please call [phone].   Barry Wright 04/12/2015, 11:13 AM

## 2015-04-12 NOTE — Progress Notes (Signed)
The Patient's daughter Darin Engels)  notified the RN that she can be reached @ [phone].  The spouse of the patient has tried a couple of times to talk with the patient on the telephone but the patient keeps falling asleep.

## 2015-04-12 NOTE — Evaluation (Signed)
SLP Cancellation Note  Patient Details Name: Barry Wright MRN: [id number] DOB: Mar 27, 1920   Cancelled treatment:       Reason Eval/Treat Not Completed: Fatigue/lethargy limiting ability to participate  Pt did not awaken adequately for po or evaluation with verbal/tactile stimulation.  Will reattempt eval at a later time.    Luanna Salk, [place] [facility] SLP [phone]

## 2015-04-12 NOTE — Progress Notes (Addendum)
[name] PROGRESS NOTE  Barry Wright QBH:[id number] DOB: February 14, 1920 DOA: 04/11/2015 PCP: Mathews Argyle, MD  Brief narrative 81 year old male with history of coronary artery disease, chronic renal disease, hypothyroidism was brought to the ED for generalized weakness and cough with yellow-green sputum. Patient also had diarrhea on 5/29 and complained of abdominal pain on the day of admission. No history of nausea or vomiting. Patient was confused upon presentation. he was found to be febrile to 100.9 Fahrenheit. Chest x-ray on admission shows left lung atelectasis. Patient admitted for possible left lobar pneumonia.   Assessment/Plan: Possible left lobar pneumonia On empiric Rocephin and azithromycin. As my since his to toxic given prolonged QTC. Culture so far negative. O2 sat stable on room air. Remains afebrile. Continue supportive care with antitussives and when necessary Tylenol. Will obtain swallow evaluation as well.   Acute kidney injury On admission, secondary to dehydration. Resolved with IV fluids  Generalized weakness Secondary to underlying pneumonia and physical deconditioning. PT Eval pending. Has active home health.  CAD Stable. Continue aspirin.  Hypothyroidism Continue Synthroid.  Diarrhea Resolved. C. difficile negative.  Diet: Heart healthy Due to prophylaxis: Subcutaneous Lovenox   Code Status: Full code Family Communication: Wife at Bedside  Disposition Plan: Home with home health possibly on 6/3   Consultants:  None  Procedures:  None  Antibiotics:  IV Rocephin and doxycycline 6/1  HPI/Subjective: Patient seen and examined. Denies any shortness of breath. Cough improving. Remains afebrile.  Objective: Filed Vitals:   04/12/15 0916  BP: 126/54  Pulse: 69  Temp: 97.7 F (36.5 C)  Resp: 20    Intake/Output Summary (Last 24 hours) at 04/12/15 1359 Last data filed at 04/12/15 1015  Gross per 24 hour  Intake 1088.75  ml  Output    320 ml  Net 768.75 ml   Filed Weights   04/11/15 1744  Weight: 87.091 kg (192 lb)    Exam:   General:  Elderly male in no acute distress  HEENT: No pallor, moist oral mucosa, supple neck  Cardiovascular: Normal S1 and S2, no murmurs rub or gallop  Respiratory: Diminished breath sounds over left lung, no rhonchi or wheeze  GI: Soft, nondistended, nontender, bowel sounds present  Musculoskeletal: Warm, no edema and he signed   CNS: Alert and oriented X2  Data Reviewed: Basic Metabolic Panel:  Recent Labs Lab 04/11/15 1218 04/12/15 0416  NA 140 141  K 3.6 3.8  CL 105 106  CO2 26 26  GLUCOSE 110* 108*  BUN 25* 22*  CREATININE 1.48* 1.14  CALCIUM 9.4 8.9   Liver Function Tests:  Recent Labs Lab 04/11/15 1218  AST 17  ALT 15*  ALKPHOS 86  BILITOT 0.3  PROT 5.9*  ALBUMIN 3.0*   No results for input(s): LIPASE, AMYLASE in the last 168 hours. No results for input(s): AMMONIA in the last 168 hours. CBC:  Recent Labs Lab 04/11/15 1218 04/12/15 0416  WBC 10.9* 9.6  HGB 14.0 12.4*  HCT 43.1 38.5*  MCV 103.9* 102.7*  PLT 211 219   Cardiac Enzymes:  Recent Labs Lab 04/11/15 1218  TROPONINI 0.03   BNP (last 3 results) No results for input(s): BNP in the last 8760 hours.  ProBNP (last 3 results) No results for input(s): PROBNP in the last 8760 hours.  CBG: No results for input(s): GLUCAP in the last 168 hours.  Recent Results (from the past 240 hour(s))  Blood culture (routine x 2)     Status: None (Preliminary  result)   Collection Time: 04/11/15  4:12 PM  Result Value Ref Range Status   Specimen Description BLOOD RIGHT HAND  Final   Special Requests BOTTLES DRAWN AEROBIC AND ANAEROBIC 5ML  Final   Culture   Final           BLOOD CULTURE RECEIVED NO GROWTH TO DATE CULTURE WILL BE HELD FOR 5 DAYS BEFORE ISSUING A FINAL NEGATIVE REPORT Performed at [facility]    Report Status PENDING  Incomplete  Blood culture  (routine x 2)     Status: None (Preliminary result)   Collection Time: 04/11/15  4:17 PM  Result Value Ref Range Status   Specimen Description BLOOD LEFT ASSIST CONTROL  Final   Special Requests BOTTLES DRAWN AEROBIC AND ANAEROBIC 5ML  Final   Culture   Final           BLOOD CULTURE RECEIVED NO GROWTH TO DATE CULTURE WILL BE HELD FOR 5 DAYS BEFORE ISSUING A FINAL NEGATIVE REPORT Performed at [facility]    Report Status PENDING  Incomplete  Clostridium Difficile by PCR     Status: None   Collection Time: 04/12/15 12:32 AM  Result Value Ref Range Status   C difficile by pcr NEGATIVE NEGATIVE Final     Studies: Dg Chest 2 View  04/11/2015   CLINICAL DATA:  Progressive weakness.  EXAM: CHEST  2 VIEW  COMPARISON:  02/06/2015 and 01/28/2015  FINDINGS: Heart size and pulmonary vascularity are normal. Slight atelectasis at the left lung base. CABG. No effusions. No acute osseous abnormality.  IMPRESSION: Slight atelectasis at the left base.   Electronically Signed   By: Lorriane Shire M.D.   On: 04/11/2015 13:28    Scheduled Meds: . aspirin EC  81 mg Oral Daily  . cefTRIAXone (ROCEPHIN)  IV  1 g Intravenous Q24H  . clopidogrel  75 mg Oral Daily  . doxazosin  4 mg Oral Daily  . doxycycline  100 mg Oral Q12H  . enoxaparin (LOVENOX) injection  40 mg Subcutaneous Q24H  . folic acid  1 mg Oral Daily  . gabapentin  300 mg Oral TID  . levothyroxine  88 mcg Oral QAC breakfast  . methotrexate  10 mg Oral Q Wed  . pantoprazole  40 mg Oral Daily  . predniSONE  10 mg Oral Q breakfast   Continuous Infusions: . sodium chloride 75 mL/hr at 04/12/15 0444     Time spent: 25 minutes    Devyn Griffing, [place]  [name] Pager [phone]. If 7PM-7AM, please contact night-coverage at [email or web address], password [facility] 04/12/2015, 1:59 PM  LOS: 1 day

## 2015-04-13 DIAGNOSIS — J181 Lobar pneumonia, unspecified organism: Secondary | ICD-10-CM | POA: Diagnosis present

## 2015-04-13 DIAGNOSIS — R9431 Abnormal electrocardiogram [ECG] [EKG]: Secondary | ICD-10-CM | POA: Insufficient documentation

## 2015-04-13 DIAGNOSIS — R531 Weakness: Secondary | ICD-10-CM

## 2015-04-13 MED ORDER — DOXYCYCLINE HYCLATE 100 MG PO TABS: 100.0000 mg | ORAL_TABLET | Freq: Two times a day (BID) | ORAL | Status: AC

## 2015-04-13 NOTE — Discharge Summary (Addendum)
Physician Discharge Summary  Barry Wright TTS:[id number] DOB: [birth year] DOA: 04/11/2015  PCP: Mathews Argyle, MD  Admit date: 04/11/2015 Discharge date: 04/13/2015  Time spent: 35 minutes  Recommendations for Outpatient Follow-up:  1. Discharge home with home health resumed. 24 hour supervision recommended by PT. 2. Patient was complete antibiotic course on 6/5.  Discharge Diagnoses:  Principal Problem:   Lobar pneumonia due to unspecified organism  Active Problems:   Coronary artery disease   Physical deconditioning   Chronic diastolic heart failure   CAP (community acquired pneumonia)   Weakness   Generalized weakness   Discharge Condition: fair  Diet recommendation: regular thin liquids  Filed Weights   04/11/15 1744  Weight: 87.091 kg (192 lb)    History of present illness:  Please refer to admission H&P for details, in brief, 81 year old male with history of coronary artery disease, chronic renal disease, hypothyroidism was brought to the ED for generalized weakness and cough with yellow-green sputum. Patient also had diarrhea on 5/29 and complained of abdominal pain on the day of admission. No history of nausea or vomiting. Patient was confused upon presentation. he was found to be febrile to 100.9 Fahrenheit. Chest x-ray on admission shows left lung atelectasis. Patient admitted for possible left lobar pneumonia.  Hospital Course:  Possible left lobar pneumonia On empiric Rocephin and azithromycin on admission . Azithromycin was changed to doxycycline given prolonged QTC. Cultures including strep pneumonia and legionella antigen negative. O2 sat stable on room air. Remains afebrile. Continue supportive care with antitussives  This is his third hospitalization for pneumonia in the past 1 year. Seen by speech and swallow and patient does not have risk for aspiration. Recommended regular diet with thin liquid. -Patient will be discharged on doxycycline to complete  a 5 day course .   Acute kidney injury On admission, secondary to dehydration. Resolved with IV fluids  Chronic diastolic CHF Compensated. Resume home dose Lasix.  Generalized weakness Secondary to underlying pneumonia and physical deconditioning. Seen by PT. Refused skilled nursing facility. Recommend home health with 24-hour supervision.  Prolonged QTC (528) I will discontinue Zofran and fexofenadine.  CAD Stable. Continue aspirin and Plavix.  Hypothyroidism Continue Synthroid.  Diarrhea Resolved. C. difficile negative.  Polymyalgia rheumatica On chronic steroidal which is continued.      Code Status: Full code Family Communication:  None at Bedside . Spoke with wife on 6/2 Disposition Plan: Home with home health    Consultants:  None  Procedures:  None  Antibiotics:  IV Rocephin and doxycycline 6/1-6/3  Discharge on oral doxycycline until 6/5  Discharge Exam: Filed Vitals:   04/13/15 0917  BP: 120/62  Pulse:   Temp:   Resp:      General: Elderly male in no acute distress  HEENT: No pallor, moist oral mucosa, supple neck  Cardiovascular: Normal S1 and S2, no murmurs rub or gallop  Respiratory: Improved breath sounds over left lung. no rhonchi or wheeze  GI: Soft, nondistended, nontender, bowel sounds present  Musculoskeletal: Warm, no edema   CNS: Alert and oriented X2  Discharge Instructions    Current Discharge Medication List    START taking these medications   Details  doxycycline (VIBRA-TABS) 100 MG tablet Take 1 tablet (100 mg total) by mouth 2 (two) times daily. Qty: 6 tablet, Refills: 0      CONTINUE these medications which have NOT CHANGED   Details  acetaminophen (TYLENOL) 500 MG tablet Take 1,000 mg by mouth 2 (two) times daily.  aspirin 81 MG tablet Take 81 mg by mouth daily.    calcium carbonate (OS-CAL - DOSED IN MG OF ELEMENTAL CALCIUM) 1250 MG tablet Take 1 tablet by mouth daily with breakfast.     clopidogrel (PLAVIX) 75 MG tablet Take 75 mg by mouth daily.      folic acid (FOLVITE) 1 MG tablet Take 1 mg by mouth daily.    furosemide (LASIX) 20 MG tablet Take 0.5 tablets (10 mg total) by mouth daily. Qty: 30 tablet, Refills: 1    gabapentin (NEURONTIN) 300 MG capsule Take 300 mg by mouth 3 (three) times daily.    levothyroxine (SYNTHROID, LEVOTHROID) 88 MCG tablet Take 88 mcg by mouth daily before breakfast.    methotrexate (RHEUMATREX) 2.5 MG tablet Take 10 mg by mouth every Wednesday.    Multiple Vitamins-Minerals (MULTIVITAMINS THER. W/MINERALS) TABS Take 1 tablet by mouth daily.      nitroGLYCERIN (NITROSTAT) 0.4 MG SL tablet Place 0.4 mg under the tongue every 5 (five) minutes as needed for chest pain.    polyethylene glycol (MIRALAX / GLYCOLAX) packet Take 17 g by mouth daily as needed for mild constipation.     predniSONE (DELTASONE) 5 MG tablet Take 5 mg by mouth daily with breakfast.     traMADol (ULTRAM) 50 MG tablet Take one tablet by mouth twice daily for pains Qty: 60 tablet, Refills: 5      STOP taking these medications     fexofenadine (ALLEGRA) 180 MG tablet      omeprazole (PRILOSEC) 20 MG capsule      ondansetron (ZOFRAN) 4 MG tablet      amLODipine (NORVASC) 2.5 MG tablet      atorvastatin (LIPITOR) 20 MG tablet      doxazosin (CARDURA) 4 MG tablet      levalbuterol (XOPENEX) 1.25 MG/0.5ML nebulizer solution        Allergies  Allergen Reactions  . Penicillins Rash   Follow-up Information    Follow up with Mathews Argyle, MD. Go on 04/27/2015.   Specialty:  Internal Medicine   Why:  Appointment time is 11:30am   Contact information:   [address] [place] [address] [phone]        The results of significant diagnostics from this hospitalization (including imaging, microbiology, ancillary and laboratory) are listed below for reference.    Significant Diagnostic Studies: Dg Chest 2 View  04/11/2015    CLINICAL DATA:  Progressive weakness.  EXAM: CHEST  2 VIEW  COMPARISON:  02/06/2015 and 01/28/2015  FINDINGS: Heart size and pulmonary vascularity are normal. Slight atelectasis at the left lung base. CABG. No effusions. No acute osseous abnormality.  IMPRESSION: Slight atelectasis at the left base.   Electronically Signed   By: Lorriane Shire M.D.   On: 04/11/2015 13:28    Microbiology: Recent Results (from the past 240 hour(s))  Blood culture (routine x 2)     Status: None (Preliminary result)   Collection Time: 04/11/15  4:12 PM  Result Value Ref Range Status   Specimen Description BLOOD RIGHT HAND  Final   Special Requests BOTTLES DRAWN AEROBIC AND ANAEROBIC 5ML  Final   Culture   Final           BLOOD CULTURE RECEIVED NO GROWTH TO DATE CULTURE WILL BE HELD FOR 5 DAYS BEFORE ISSUING A FINAL NEGATIVE REPORT Performed at [facility]    Report Status PENDING  Incomplete  Blood culture (routine x 2)  Status: None (Preliminary result)   Collection Time: 04/11/15  4:17 PM  Result Value Ref Range Status   Specimen Description BLOOD LEFT ASSIST CONTROL  Final   Special Requests BOTTLES DRAWN AEROBIC AND ANAEROBIC 5ML  Final   Culture   Final           BLOOD CULTURE RECEIVED NO GROWTH TO DATE CULTURE WILL BE HELD FOR 5 DAYS BEFORE ISSUING A FINAL NEGATIVE REPORT Performed at [facility]    Report Status PENDING  Incomplete  Clostridium Difficile by PCR     Status: None   Collection Time: 04/12/15 12:32 AM  Result Value Ref Range Status   C difficile by pcr NEGATIVE NEGATIVE Final     Labs: Basic Metabolic Panel:  Recent Labs Lab 04/11/15 1218 04/12/15 0416  NA 140 141  K 3.6 3.8  CL 105 106  CO2 26 26  GLUCOSE 110* 108*  BUN 25* 22*  CREATININE 1.48* 1.14  CALCIUM 9.4 8.9   Liver Function Tests:  Recent Labs Lab 04/11/15 1218  AST 17  ALT 15*  ALKPHOS 86  BILITOT 0.3  PROT 5.9*  ALBUMIN 3.0*   No results for input(s): LIPASE, AMYLASE in the  last 168 hours. No results for input(s): AMMONIA in the last 168 hours. CBC:  Recent Labs Lab 04/11/15 1218 04/12/15 0416  WBC 10.9* 9.6  HGB 14.0 12.4*  HCT 43.1 38.5*  MCV 103.9* 102.7*  PLT 211 219   Cardiac Enzymes:  Recent Labs Lab 04/11/15 1218  TROPONINI 0.03   BNP: BNP (last 3 results) No results for input(s): BNP in the last 8760 hours.  ProBNP (last 3 results) No results for input(s): PROBNP in the last 8760 hours.  CBG: No results for input(s): GLUCAP in the last 168 hours.     SignedLouellen Molder  [name] 04/13/2015, 10:31 AM

## 2015-04-13 NOTE — Care Management Note (Signed)
Case Management Note  Patient Details  Name: Barry Wright MRN: [id number] Date of Birth: [birth year]  Subjective/Objective:   81 y/o m admitted w/weakness.From home Active w/[facility] HHRN/PT.                 Action/Plan:d/c plan return home.   Expected Discharge Date:                  Expected Discharge Plan:  [place]  In-House Referral:     Discharge planning Services  CM Consult  Post Acute Care Choice:  Home Health (Active w/[facility] HHRN/HHPT.) Choice offered to:  Patient  DME Arranged:    DME Agency:     HH Arranged:    HH Agency:     Status of Service:  In process, will continue to follow  Medicare Important Message Given:    Date Medicare IM Given:    Medicare IM give by:    Date Additional Medicare IM Given:    Additional Medicare Important Message give by:     If discussed at [place] of Stay Meetings, dates discussed:    Additional Comments:  Dessa Phi, RN 04/13/2015, 9:02 AM

## 2015-04-13 NOTE — Care Management Note (Signed)
Case Management Note  Patient Details  Name: FARRON WATROUS MRN: [id number] Date of Birth: [birth year]  Subjective/Objective: Already active w/[facility] HHRN/PT.[facility] rep Kristen aware of d/c & HHC orders.                   Action/Plan:d/c home w/HHC. No further d/c needs or orders.   Expected Discharge Date:                  Expected Discharge Plan:  [place]  In-House Referral:     Discharge planning Services  CM Consult  Post Acute Care Choice:  [place] (Active w/[facility] HHRN/HHPT.) Choice offered to:  Patient  DME Arranged:    DME Agency:     HH Arranged:  RN, PT [place] Agency:  [place]  Status of Service:  Completed, signed off  Medicare Important Message Given:    Date Medicare IM Given:    Medicare IM give by:    Date Additional Medicare IM Given:    Additional Medicare Important Message give by:     If discussed at [place] of Stay Meetings, dates discussed:    Additional Comments:  Dessa Phi, RN 04/13/2015, 12:44 PM

## 2015-04-13 NOTE — Evaluation (Signed)
Clinical/Bedside Swallow Evaluation Patient Details  Name: Barry Wright MRN: [id number] Date of Birth: 03-21-1920  Today's Date: 04/13/2015 Time: SLP Start Time (ACUTE ONLY): 75 SLP Stop Time (ACUTE ONLY): 1143 SLP Time Calculation (min) (ACUTE ONLY): 38 min  Past Medical History:  Past Medical History  Diagnosis Date  . Chronic back pain   . Hypercholesteremia   . Hypothyroidism   . CAD (coronary artery disease)   . Chronic kidney disease (CKD), stage III (moderate)   . Polymyalgia rheumatica   . Left bundle branch block   . Hx of CABG   . History of nephrectomy   . Internal carotid artery stenosis   . Myocardial infarction   . CHF (congestive heart failure)   . Stroke    Past Surgical History:  Past Surgical History  Procedure Laterality Date  . Coronary artery bypass graft    . Cholecystectomy    . Kidney surgery      kidney removed   HPI:  81 year old male with history of coronary artery disease, chronic renal disease, hypothyroidism was brought to the ED for generalized weakness and cough with yellow-green sputum. Patient also had diarrhea on 5/29 and complained of abdominal pain on the day of admission. No history of nausea or vomiting. Patient was confused upon presentation. he was found to be febrile to 100.9 Fahrenheit. Chest x-ray on admission shows left lung atelectasis. Patient admitted for possible left lobar pneumonia.   Assessment / Plan / Recommendation Clinical Impression  Pt appears to have normal swallow function at b/s.  No oral-motor weakness, timely swallow initiation, with adequate laryngeal elevation palpated; no cough or throat clearing after swallows and voice quality remains clear.  Pt denies reflux or other known esophageal issues, and appears to be tolerating his current regular consistency diet with thin liquids.    Aspiration Risk  Mild    Diet Recommendation Age appropriate regular solids;Thin   Medication Administration: Whole meds with  puree Compensations: Slow rate;Small sips/bites    Other  Recommendations Oral Care Recommendations: Oral care BID Other Recommendations: Clarify dietary restrictions   Follow Up Recommendations       Frequency and Duration        Pertinent Vitals/Pain N/a       Swallow Study Prior Functional Status       General Other Pertinent Information: 81 year old male with history of coronary artery disease, chronic renal disease, hypothyroidism was brought to the ED for generalized weakness and cough with yellow-green sputum. Patient also had diarrhea on 5/29 and complained of abdominal pain on the day of admission. No history of nausea or vomiting. Patient was confused upon presentation. he was found to be febrile to 100.9 Fahrenheit. Chest x-ray on admission shows left lung atelectasis. Patient admitted for possible left lobar pneumonia. Type of Study: Bedside swallow evaluation Previous Swallow Assessment: none Diet Prior to this Study: Regular;Thin liquids Temperature Spikes Noted: No Respiratory Status: Room air History of Recent Intubation: No Behavior/Cognition: Alert;Cooperative;Pleasant mood Oral Cavity - Dentition: Adequate natural dentition/normal for age Self-Feeding Abilities: Able to feed self Patient Positioning: Upright in bed Baseline Vocal Quality: Normal Volitional Cough: Strong Volitional Swallow: Able to elicit    Oral/Motor/Sensory Function Overall Oral Motor/Sensory Function: Appears within functional limits for tasks assessed   Ice Chips Ice chips: Within functional limits Presentation: Spoon   Thin Liquid Thin Liquid: Within functional limits Presentation: Cup;Straw    Nectar Thick Nectar Thick Liquid: Not tested   Honey Thick Honey Thick Liquid: Not  tested   Puree Puree: Within functional limits Presentation: Spoon   Solid   GO    Solid: Within functional limits Presentation: Self Loyal Buba, Xia Stohr T 04/13/2015,11:43 AM

## 2015-04-13 NOTE — Progress Notes (Signed)
Initial Nutrition Assessment  DOCUMENTATION CODES:  Not applicable  INTERVENTION: - Continue current diet order  NUTRITION DIAGNOSIS:  No nutrition dx at this time.  GOAL:  Patient will meet greater than or equal to 90% of their needs  MONITOR:  PO intake, Weight trends, Labs, I & O's  REASON FOR ASSESSMENT:  Consult Assessment of nutrition requirement/status  ASSESSMENT: 81 y.o. male with h/o CAD, CKD, hypothyroidism, was brought in for generalized weakness. On arrival he was found to be febrile at 100.9, and coughing yellow green sputum. As per the aid, patient had diarrhea on Sunday and today and had complained of abdominal pain . He was not vomiting PTA.  Pt seen for consult for assessment. BMI indicates overweight status. Pt reports he has had a good appetite here and PTA; per chart review he has been eating 50-75% of meals. For breakfast he had sausage, eggs, and a bagel. Pt denies chewing or swallowing issues. SLP was unable to see pt yesterday for swallow evaluation as pt was noted to be too lethargic at that time.  PTA he would sometimes drink Ensure and would use it as his drink for meals in place of water or juice despite good intakes. Encouraged this when pt returns home. He states he is to d/c today pending EKG and results. Pt unsure of weight PTA or potential weight changes. Per chart review, it appears that pt has been gaining weight over the past few months; unsure if this is lean body and fat mass versus fluid accumulation or both.  Pt meeting needs. Physical assessment does not show muscle or fat wasting at this time. Labs and medications reviewed.  Height:  Ht Readings from Last 1 Encounters:  04/11/15 5\' 11"  (1.803 m)    Weight:  Wt Readings from Last 1 Encounters:  04/11/15 192 lb (87.091 kg)    Ideal Body Weight:  78.2 kg (kg)  Wt Readings from Last 10 Encounters:  04/11/15 192 lb (87.091 kg)  01/30/15 175 lb 0.7 oz (79.4 kg)  10/31/14 186 lb  (84.369 kg)  06/19/14 186 lb (84.369 kg)  03/16/14 181 lb 12.8 oz (82.464 kg)  03/09/14 186 lb (84.369 kg)  03/07/14 186 lb 11.2 oz (84.687 kg)  03/05/14 184 lb 1.4 oz (83.5 kg)  02/21/14 188 lb 7.9 oz (85.5 kg)  01/06/13 194 lb 3.6 oz (88.1 kg)    BMI:  Body mass index is 26.79 kg/(m^2).  Estimated Nutritional Needs:  Kcal:  1062-6948  Protein:  70-80 grams  Fluid:  2-2.5 L/day  Skin:  Reviewed, no issues  Diet Order:  Diet Heart Room service appropriate?: Yes; Fluid consistency:: Thin  EDUCATION NEEDS:  No education needs identified at this time   Intake/Output Summary (Last 24 hours) at 04/13/15 1044 Last data filed at 04/13/15 0849  Gross per 24 hour  Intake 2713.75 ml  Output   1050 ml  Net 1663.75 ml    Last BM:  6/3   Jarome Matin, RD, LDN Inpatient Clinical Dietitian Pager # [phone] After hours/weekend pager # [phone]

## 2015-04-15 DIAGNOSIS — I2510 Atherosclerotic heart disease of native coronary artery without angina pectoris: Secondary | ICD-10-CM | POA: Diagnosis not present

## 2015-04-15 DIAGNOSIS — I5032 Chronic diastolic (congestive) heart failure: Secondary | ICD-10-CM | POA: Diagnosis not present

## 2015-04-15 DIAGNOSIS — E43 Unspecified severe protein-calorie malnutrition: Secondary | ICD-10-CM | POA: Diagnosis not present

## 2015-04-15 DIAGNOSIS — M353 Polymyalgia rheumatica: Secondary | ICD-10-CM | POA: Diagnosis not present

## 2015-04-15 DIAGNOSIS — N183 Chronic kidney disease, stage 3 (moderate): Secondary | ICD-10-CM | POA: Diagnosis not present

## 2015-04-15 DIAGNOSIS — I129 Hypertensive chronic kidney disease with stage 1 through stage 4 chronic kidney disease, or unspecified chronic kidney disease: Secondary | ICD-10-CM | POA: Diagnosis not present

## 2015-04-16 DIAGNOSIS — M353 Polymyalgia rheumatica: Secondary | ICD-10-CM | POA: Diagnosis not present

## 2015-04-16 DIAGNOSIS — E43 Unspecified severe protein-calorie malnutrition: Secondary | ICD-10-CM | POA: Diagnosis not present

## 2015-04-16 DIAGNOSIS — N183 Chronic kidney disease, stage 3 (moderate): Secondary | ICD-10-CM | POA: Diagnosis not present

## 2015-04-16 DIAGNOSIS — I5032 Chronic diastolic (congestive) heart failure: Secondary | ICD-10-CM | POA: Diagnosis not present

## 2015-04-16 DIAGNOSIS — I129 Hypertensive chronic kidney disease with stage 1 through stage 4 chronic kidney disease, or unspecified chronic kidney disease: Secondary | ICD-10-CM | POA: Diagnosis not present

## 2015-04-16 DIAGNOSIS — I2510 Atherosclerotic heart disease of native coronary artery without angina pectoris: Secondary | ICD-10-CM | POA: Diagnosis not present

## 2015-04-17 DIAGNOSIS — I5032 Chronic diastolic (congestive) heart failure: Secondary | ICD-10-CM | POA: Diagnosis not present

## 2015-04-17 DIAGNOSIS — N183 Chronic kidney disease, stage 3 (moderate): Secondary | ICD-10-CM | POA: Diagnosis not present

## 2015-04-17 DIAGNOSIS — M353 Polymyalgia rheumatica: Secondary | ICD-10-CM | POA: Diagnosis not present

## 2015-04-17 DIAGNOSIS — I129 Hypertensive chronic kidney disease with stage 1 through stage 4 chronic kidney disease, or unspecified chronic kidney disease: Secondary | ICD-10-CM | POA: Diagnosis not present

## 2015-04-17 DIAGNOSIS — E43 Unspecified severe protein-calorie malnutrition: Secondary | ICD-10-CM | POA: Diagnosis not present

## 2015-04-17 DIAGNOSIS — I2510 Atherosclerotic heart disease of native coronary artery without angina pectoris: Secondary | ICD-10-CM | POA: Diagnosis not present

## 2015-04-17 LAB — CULTURE, BLOOD (ROUTINE X 2)
CULTURE: NO GROWTH
Culture: NO GROWTH

## 2015-04-18 DIAGNOSIS — I5032 Chronic diastolic (congestive) heart failure: Secondary | ICD-10-CM | POA: Diagnosis not present

## 2015-04-18 DIAGNOSIS — N183 Chronic kidney disease, stage 3 (moderate): Secondary | ICD-10-CM | POA: Diagnosis not present

## 2015-04-18 DIAGNOSIS — I2510 Atherosclerotic heart disease of native coronary artery without angina pectoris: Secondary | ICD-10-CM | POA: Diagnosis not present

## 2015-04-18 DIAGNOSIS — E43 Unspecified severe protein-calorie malnutrition: Secondary | ICD-10-CM | POA: Diagnosis not present

## 2015-04-18 DIAGNOSIS — I129 Hypertensive chronic kidney disease with stage 1 through stage 4 chronic kidney disease, or unspecified chronic kidney disease: Secondary | ICD-10-CM | POA: Diagnosis not present

## 2015-04-18 DIAGNOSIS — M353 Polymyalgia rheumatica: Secondary | ICD-10-CM | POA: Diagnosis not present

## 2015-04-20 DIAGNOSIS — I129 Hypertensive chronic kidney disease with stage 1 through stage 4 chronic kidney disease, or unspecified chronic kidney disease: Secondary | ICD-10-CM | POA: Diagnosis not present

## 2015-04-20 DIAGNOSIS — N183 Chronic kidney disease, stage 3 (moderate): Secondary | ICD-10-CM | POA: Diagnosis not present

## 2015-04-20 DIAGNOSIS — M353 Polymyalgia rheumatica: Secondary | ICD-10-CM | POA: Diagnosis not present

## 2015-04-20 DIAGNOSIS — I2510 Atherosclerotic heart disease of native coronary artery without angina pectoris: Secondary | ICD-10-CM | POA: Diagnosis not present

## 2015-04-20 DIAGNOSIS — I5032 Chronic diastolic (congestive) heart failure: Secondary | ICD-10-CM | POA: Diagnosis not present

## 2015-04-20 DIAGNOSIS — E43 Unspecified severe protein-calorie malnutrition: Secondary | ICD-10-CM | POA: Diagnosis not present

## 2015-04-23 DIAGNOSIS — I2510 Atherosclerotic heart disease of native coronary artery without angina pectoris: Secondary | ICD-10-CM | POA: Diagnosis not present

## 2015-04-23 DIAGNOSIS — E43 Unspecified severe protein-calorie malnutrition: Secondary | ICD-10-CM | POA: Diagnosis not present

## 2015-04-23 DIAGNOSIS — N183 Chronic kidney disease, stage 3 (moderate): Secondary | ICD-10-CM | POA: Diagnosis not present

## 2015-04-23 DIAGNOSIS — I5032 Chronic diastolic (congestive) heart failure: Secondary | ICD-10-CM | POA: Diagnosis not present

## 2015-04-23 DIAGNOSIS — I129 Hypertensive chronic kidney disease with stage 1 through stage 4 chronic kidney disease, or unspecified chronic kidney disease: Secondary | ICD-10-CM | POA: Diagnosis not present

## 2015-04-23 DIAGNOSIS — M353 Polymyalgia rheumatica: Secondary | ICD-10-CM | POA: Diagnosis not present

## 2015-04-24 DIAGNOSIS — I2510 Atherosclerotic heart disease of native coronary artery without angina pectoris: Secondary | ICD-10-CM | POA: Diagnosis not present

## 2015-04-24 DIAGNOSIS — I5032 Chronic diastolic (congestive) heart failure: Secondary | ICD-10-CM | POA: Diagnosis not present

## 2015-04-24 DIAGNOSIS — M353 Polymyalgia rheumatica: Secondary | ICD-10-CM | POA: Diagnosis not present

## 2015-04-24 DIAGNOSIS — I129 Hypertensive chronic kidney disease with stage 1 through stage 4 chronic kidney disease, or unspecified chronic kidney disease: Secondary | ICD-10-CM | POA: Diagnosis not present

## 2015-04-24 DIAGNOSIS — E43 Unspecified severe protein-calorie malnutrition: Secondary | ICD-10-CM | POA: Diagnosis not present

## 2015-04-24 DIAGNOSIS — N183 Chronic kidney disease, stage 3 (moderate): Secondary | ICD-10-CM | POA: Diagnosis not present

## 2015-04-26 DIAGNOSIS — I2510 Atherosclerotic heart disease of native coronary artery without angina pectoris: Secondary | ICD-10-CM | POA: Diagnosis not present

## 2015-04-26 DIAGNOSIS — E43 Unspecified severe protein-calorie malnutrition: Secondary | ICD-10-CM | POA: Diagnosis not present

## 2015-04-26 DIAGNOSIS — M353 Polymyalgia rheumatica: Secondary | ICD-10-CM | POA: Diagnosis not present

## 2015-04-26 DIAGNOSIS — I5032 Chronic diastolic (congestive) heart failure: Secondary | ICD-10-CM | POA: Diagnosis not present

## 2015-04-26 DIAGNOSIS — N183 Chronic kidney disease, stage 3 (moderate): Secondary | ICD-10-CM | POA: Diagnosis not present

## 2015-04-26 DIAGNOSIS — I129 Hypertensive chronic kidney disease with stage 1 through stage 4 chronic kidney disease, or unspecified chronic kidney disease: Secondary | ICD-10-CM | POA: Diagnosis not present

## 2015-04-27 DIAGNOSIS — I2510 Atherosclerotic heart disease of native coronary artery without angina pectoris: Secondary | ICD-10-CM | POA: Diagnosis not present

## 2015-04-27 DIAGNOSIS — I129 Hypertensive chronic kidney disease with stage 1 through stage 4 chronic kidney disease, or unspecified chronic kidney disease: Secondary | ICD-10-CM | POA: Diagnosis not present

## 2015-04-27 DIAGNOSIS — N183 Chronic kidney disease, stage 3 (moderate): Secondary | ICD-10-CM | POA: Diagnosis not present

## 2015-04-27 DIAGNOSIS — E43 Unspecified severe protein-calorie malnutrition: Secondary | ICD-10-CM | POA: Diagnosis not present

## 2015-04-27 DIAGNOSIS — M353 Polymyalgia rheumatica: Secondary | ICD-10-CM | POA: Diagnosis not present

## 2015-04-27 DIAGNOSIS — I5032 Chronic diastolic (congestive) heart failure: Secondary | ICD-10-CM | POA: Diagnosis not present

## 2015-04-30 DIAGNOSIS — E43 Unspecified severe protein-calorie malnutrition: Secondary | ICD-10-CM | POA: Diagnosis not present

## 2015-04-30 DIAGNOSIS — M353 Polymyalgia rheumatica: Secondary | ICD-10-CM | POA: Diagnosis not present

## 2015-04-30 DIAGNOSIS — I5032 Chronic diastolic (congestive) heart failure: Secondary | ICD-10-CM | POA: Diagnosis not present

## 2015-04-30 DIAGNOSIS — I2510 Atherosclerotic heart disease of native coronary artery without angina pectoris: Secondary | ICD-10-CM | POA: Diagnosis not present

## 2015-04-30 DIAGNOSIS — I129 Hypertensive chronic kidney disease with stage 1 through stage 4 chronic kidney disease, or unspecified chronic kidney disease: Secondary | ICD-10-CM | POA: Diagnosis not present

## 2015-04-30 DIAGNOSIS — N183 Chronic kidney disease, stage 3 (moderate): Secondary | ICD-10-CM | POA: Diagnosis not present

## 2015-05-01 DIAGNOSIS — I2510 Atherosclerotic heart disease of native coronary artery without angina pectoris: Secondary | ICD-10-CM | POA: Diagnosis not present

## 2015-05-01 DIAGNOSIS — M353 Polymyalgia rheumatica: Secondary | ICD-10-CM | POA: Diagnosis not present

## 2015-05-01 DIAGNOSIS — E43 Unspecified severe protein-calorie malnutrition: Secondary | ICD-10-CM | POA: Diagnosis not present

## 2015-05-01 DIAGNOSIS — I129 Hypertensive chronic kidney disease with stage 1 through stage 4 chronic kidney disease, or unspecified chronic kidney disease: Secondary | ICD-10-CM | POA: Diagnosis not present

## 2015-05-01 DIAGNOSIS — N183 Chronic kidney disease, stage 3 (moderate): Secondary | ICD-10-CM | POA: Diagnosis not present

## 2015-05-01 DIAGNOSIS — I5032 Chronic diastolic (congestive) heart failure: Secondary | ICD-10-CM | POA: Diagnosis not present

## 2015-05-02 DIAGNOSIS — I129 Hypertensive chronic kidney disease with stage 1 through stage 4 chronic kidney disease, or unspecified chronic kidney disease: Secondary | ICD-10-CM | POA: Diagnosis not present

## 2015-05-02 DIAGNOSIS — N183 Chronic kidney disease, stage 3 (moderate): Secondary | ICD-10-CM | POA: Diagnosis not present

## 2015-05-02 DIAGNOSIS — I5032 Chronic diastolic (congestive) heart failure: Secondary | ICD-10-CM | POA: Diagnosis not present

## 2015-05-02 DIAGNOSIS — M353 Polymyalgia rheumatica: Secondary | ICD-10-CM | POA: Diagnosis not present

## 2015-05-02 DIAGNOSIS — I2510 Atherosclerotic heart disease of native coronary artery without angina pectoris: Secondary | ICD-10-CM | POA: Diagnosis not present

## 2015-05-02 DIAGNOSIS — E43 Unspecified severe protein-calorie malnutrition: Secondary | ICD-10-CM | POA: Diagnosis not present

## 2015-05-03 DIAGNOSIS — M353 Polymyalgia rheumatica: Secondary | ICD-10-CM | POA: Diagnosis not present

## 2015-05-03 DIAGNOSIS — I5032 Chronic diastolic (congestive) heart failure: Secondary | ICD-10-CM | POA: Diagnosis not present

## 2015-05-03 DIAGNOSIS — N183 Chronic kidney disease, stage 3 (moderate): Secondary | ICD-10-CM | POA: Diagnosis not present

## 2015-05-03 DIAGNOSIS — I129 Hypertensive chronic kidney disease with stage 1 through stage 4 chronic kidney disease, or unspecified chronic kidney disease: Secondary | ICD-10-CM | POA: Diagnosis not present

## 2015-05-03 DIAGNOSIS — I2510 Atherosclerotic heart disease of native coronary artery without angina pectoris: Secondary | ICD-10-CM | POA: Diagnosis not present

## 2015-05-03 DIAGNOSIS — E43 Unspecified severe protein-calorie malnutrition: Secondary | ICD-10-CM | POA: Diagnosis not present

## 2015-05-04 DIAGNOSIS — M353 Polymyalgia rheumatica: Secondary | ICD-10-CM | POA: Diagnosis not present

## 2015-05-04 DIAGNOSIS — I2510 Atherosclerotic heart disease of native coronary artery without angina pectoris: Secondary | ICD-10-CM | POA: Diagnosis not present

## 2015-05-04 DIAGNOSIS — E43 Unspecified severe protein-calorie malnutrition: Secondary | ICD-10-CM | POA: Diagnosis not present

## 2015-05-04 DIAGNOSIS — I129 Hypertensive chronic kidney disease with stage 1 through stage 4 chronic kidney disease, or unspecified chronic kidney disease: Secondary | ICD-10-CM | POA: Diagnosis not present

## 2015-05-04 DIAGNOSIS — I5032 Chronic diastolic (congestive) heart failure: Secondary | ICD-10-CM | POA: Diagnosis not present

## 2015-05-04 DIAGNOSIS — N183 Chronic kidney disease, stage 3 (moderate): Secondary | ICD-10-CM | POA: Diagnosis not present

## 2015-05-08 ENCOUNTER — Other Ambulatory Visit: Payer: Self-pay | Admitting: Geriatric Medicine

## 2015-05-08 ENCOUNTER — Ambulatory Visit
Admission: RE | Admit: 2015-05-08 | Discharge: 2015-05-08 | Disposition: A | Discharge status: Home / Self Care | Payer: Medicare Other | Source: Ambulatory Visit | Attending: Geriatric Medicine | Admitting: Geriatric Medicine

## 2015-05-08 DIAGNOSIS — J189 Pneumonia, unspecified organism: Secondary | ICD-10-CM | POA: Diagnosis not present

## 2015-05-08 DIAGNOSIS — M069 Rheumatoid arthritis, unspecified: Secondary | ICD-10-CM | POA: Diagnosis not present

## 2015-05-08 DIAGNOSIS — M4806 Spinal stenosis, lumbar region: Secondary | ICD-10-CM | POA: Diagnosis not present

## 2015-05-08 DIAGNOSIS — J181 Lobar pneumonia, unspecified organism: Secondary | ICD-10-CM | POA: Diagnosis not present

## 2015-05-08 DIAGNOSIS — Z951 Presence of aortocoronary bypass graft: Secondary | ICD-10-CM | POA: Diagnosis not present

## 2015-05-08 DIAGNOSIS — D692 Other nonthrombocytopenic purpura: Secondary | ICD-10-CM | POA: Diagnosis not present

## 2015-05-08 DIAGNOSIS — D7589 Other specified diseases of blood and blood-forming organs: Secondary | ICD-10-CM | POA: Diagnosis not present

## 2015-05-08 DIAGNOSIS — Z79899 Other long term (current) drug therapy: Secondary | ICD-10-CM | POA: Diagnosis not present

## 2015-05-08 DIAGNOSIS — I129 Hypertensive chronic kidney disease with stage 1 through stage 4 chronic kidney disease, or unspecified chronic kidney disease: Secondary | ICD-10-CM | POA: Diagnosis not present

## 2015-05-08 DIAGNOSIS — N183 Chronic kidney disease, stage 3 (moderate): Secondary | ICD-10-CM | POA: Diagnosis not present

## 2015-05-08 DIAGNOSIS — Z7189 Other specified counseling: Secondary | ICD-10-CM | POA: Diagnosis not present

## 2015-05-09 DIAGNOSIS — M353 Polymyalgia rheumatica: Secondary | ICD-10-CM | POA: Diagnosis not present

## 2015-05-09 DIAGNOSIS — N183 Chronic kidney disease, stage 3 (moderate): Secondary | ICD-10-CM | POA: Diagnosis not present

## 2015-05-09 DIAGNOSIS — E43 Unspecified severe protein-calorie malnutrition: Secondary | ICD-10-CM | POA: Diagnosis not present

## 2015-05-09 DIAGNOSIS — I5032 Chronic diastolic (congestive) heart failure: Secondary | ICD-10-CM | POA: Diagnosis not present

## 2015-05-09 DIAGNOSIS — I129 Hypertensive chronic kidney disease with stage 1 through stage 4 chronic kidney disease, or unspecified chronic kidney disease: Secondary | ICD-10-CM | POA: Diagnosis not present

## 2015-05-09 DIAGNOSIS — I2510 Atherosclerotic heart disease of native coronary artery without angina pectoris: Secondary | ICD-10-CM | POA: Diagnosis not present

## 2015-05-10 DIAGNOSIS — M353 Polymyalgia rheumatica: Secondary | ICD-10-CM | POA: Diagnosis not present

## 2015-05-10 DIAGNOSIS — E43 Unspecified severe protein-calorie malnutrition: Secondary | ICD-10-CM | POA: Diagnosis not present

## 2015-05-10 DIAGNOSIS — I2510 Atherosclerotic heart disease of native coronary artery without angina pectoris: Secondary | ICD-10-CM | POA: Diagnosis not present

## 2015-05-10 DIAGNOSIS — N183 Chronic kidney disease, stage 3 (moderate): Secondary | ICD-10-CM | POA: Diagnosis not present

## 2015-05-10 DIAGNOSIS — I129 Hypertensive chronic kidney disease with stage 1 through stage 4 chronic kidney disease, or unspecified chronic kidney disease: Secondary | ICD-10-CM | POA: Diagnosis not present

## 2015-05-10 DIAGNOSIS — I5032 Chronic diastolic (congestive) heart failure: Secondary | ICD-10-CM | POA: Diagnosis not present

## 2015-05-11 DIAGNOSIS — I5032 Chronic diastolic (congestive) heart failure: Secondary | ICD-10-CM | POA: Diagnosis not present

## 2015-05-11 DIAGNOSIS — M353 Polymyalgia rheumatica: Secondary | ICD-10-CM | POA: Diagnosis not present

## 2015-05-11 DIAGNOSIS — I2510 Atherosclerotic heart disease of native coronary artery without angina pectoris: Secondary | ICD-10-CM | POA: Diagnosis not present

## 2015-05-11 DIAGNOSIS — N183 Chronic kidney disease, stage 3 (moderate): Secondary | ICD-10-CM | POA: Diagnosis not present

## 2015-05-11 DIAGNOSIS — I129 Hypertensive chronic kidney disease with stage 1 through stage 4 chronic kidney disease, or unspecified chronic kidney disease: Secondary | ICD-10-CM | POA: Diagnosis not present

## 2015-05-11 DIAGNOSIS — E43 Unspecified severe protein-calorie malnutrition: Secondary | ICD-10-CM | POA: Diagnosis not present

## 2015-05-15 DIAGNOSIS — I5032 Chronic diastolic (congestive) heart failure: Secondary | ICD-10-CM | POA: Diagnosis not present

## 2015-05-15 DIAGNOSIS — M353 Polymyalgia rheumatica: Secondary | ICD-10-CM | POA: Diagnosis not present

## 2015-05-15 DIAGNOSIS — I129 Hypertensive chronic kidney disease with stage 1 through stage 4 chronic kidney disease, or unspecified chronic kidney disease: Secondary | ICD-10-CM | POA: Diagnosis not present

## 2015-05-15 DIAGNOSIS — I2510 Atherosclerotic heart disease of native coronary artery without angina pectoris: Secondary | ICD-10-CM | POA: Diagnosis not present

## 2015-05-15 DIAGNOSIS — E43 Unspecified severe protein-calorie malnutrition: Secondary | ICD-10-CM | POA: Diagnosis not present

## 2015-05-15 DIAGNOSIS — N183 Chronic kidney disease, stage 3 (moderate): Secondary | ICD-10-CM | POA: Diagnosis not present

## 2015-05-16 DIAGNOSIS — E43 Unspecified severe protein-calorie malnutrition: Secondary | ICD-10-CM | POA: Diagnosis not present

## 2015-05-16 DIAGNOSIS — I2510 Atherosclerotic heart disease of native coronary artery without angina pectoris: Secondary | ICD-10-CM | POA: Diagnosis not present

## 2015-05-16 DIAGNOSIS — I5032 Chronic diastolic (congestive) heart failure: Secondary | ICD-10-CM | POA: Diagnosis not present

## 2015-05-16 DIAGNOSIS — M353 Polymyalgia rheumatica: Secondary | ICD-10-CM | POA: Diagnosis not present

## 2015-05-16 DIAGNOSIS — N183 Chronic kidney disease, stage 3 (moderate): Secondary | ICD-10-CM | POA: Diagnosis not present

## 2015-05-16 DIAGNOSIS — I129 Hypertensive chronic kidney disease with stage 1 through stage 4 chronic kidney disease, or unspecified chronic kidney disease: Secondary | ICD-10-CM | POA: Diagnosis not present

## 2015-05-18 DIAGNOSIS — N183 Chronic kidney disease, stage 3 (moderate): Secondary | ICD-10-CM | POA: Diagnosis not present

## 2015-05-18 DIAGNOSIS — M353 Polymyalgia rheumatica: Secondary | ICD-10-CM | POA: Diagnosis not present

## 2015-05-18 DIAGNOSIS — I129 Hypertensive chronic kidney disease with stage 1 through stage 4 chronic kidney disease, or unspecified chronic kidney disease: Secondary | ICD-10-CM | POA: Diagnosis not present

## 2015-05-18 DIAGNOSIS — I5032 Chronic diastolic (congestive) heart failure: Secondary | ICD-10-CM | POA: Diagnosis not present

## 2015-05-18 DIAGNOSIS — I2510 Atherosclerotic heart disease of native coronary artery without angina pectoris: Secondary | ICD-10-CM | POA: Diagnosis not present

## 2015-05-18 DIAGNOSIS — E43 Unspecified severe protein-calorie malnutrition: Secondary | ICD-10-CM | POA: Diagnosis not present

## 2015-05-23 DIAGNOSIS — I129 Hypertensive chronic kidney disease with stage 1 through stage 4 chronic kidney disease, or unspecified chronic kidney disease: Secondary | ICD-10-CM | POA: Diagnosis not present

## 2015-05-23 DIAGNOSIS — I2510 Atherosclerotic heart disease of native coronary artery without angina pectoris: Secondary | ICD-10-CM | POA: Diagnosis not present

## 2015-05-23 DIAGNOSIS — N183 Chronic kidney disease, stage 3 (moderate): Secondary | ICD-10-CM | POA: Diagnosis not present

## 2015-05-23 DIAGNOSIS — E43 Unspecified severe protein-calorie malnutrition: Secondary | ICD-10-CM | POA: Diagnosis not present

## 2015-05-23 DIAGNOSIS — I5032 Chronic diastolic (congestive) heart failure: Secondary | ICD-10-CM | POA: Diagnosis not present

## 2015-05-23 DIAGNOSIS — M353 Polymyalgia rheumatica: Secondary | ICD-10-CM | POA: Diagnosis not present

## 2015-05-25 DIAGNOSIS — I129 Hypertensive chronic kidney disease with stage 1 through stage 4 chronic kidney disease, or unspecified chronic kidney disease: Secondary | ICD-10-CM | POA: Diagnosis not present

## 2015-05-25 DIAGNOSIS — M353 Polymyalgia rheumatica: Secondary | ICD-10-CM | POA: Diagnosis not present

## 2015-05-25 DIAGNOSIS — N183 Chronic kidney disease, stage 3 (moderate): Secondary | ICD-10-CM | POA: Diagnosis not present

## 2015-05-25 DIAGNOSIS — I2510 Atherosclerotic heart disease of native coronary artery without angina pectoris: Secondary | ICD-10-CM | POA: Diagnosis not present

## 2015-05-25 DIAGNOSIS — I5032 Chronic diastolic (congestive) heart failure: Secondary | ICD-10-CM | POA: Diagnosis not present

## 2015-05-25 DIAGNOSIS — E43 Unspecified severe protein-calorie malnutrition: Secondary | ICD-10-CM | POA: Diagnosis not present

## 2015-05-28 DIAGNOSIS — E43 Unspecified severe protein-calorie malnutrition: Secondary | ICD-10-CM | POA: Diagnosis not present

## 2015-05-28 DIAGNOSIS — I2510 Atherosclerotic heart disease of native coronary artery without angina pectoris: Secondary | ICD-10-CM | POA: Diagnosis not present

## 2015-05-28 DIAGNOSIS — M353 Polymyalgia rheumatica: Secondary | ICD-10-CM | POA: Diagnosis not present

## 2015-05-28 DIAGNOSIS — N183 Chronic kidney disease, stage 3 (moderate): Secondary | ICD-10-CM | POA: Diagnosis not present

## 2015-05-28 DIAGNOSIS — I5032 Chronic diastolic (congestive) heart failure: Secondary | ICD-10-CM | POA: Diagnosis not present

## 2015-05-28 DIAGNOSIS — I129 Hypertensive chronic kidney disease with stage 1 through stage 4 chronic kidney disease, or unspecified chronic kidney disease: Secondary | ICD-10-CM | POA: Diagnosis not present

## 2015-05-29 DIAGNOSIS — N183 Chronic kidney disease, stage 3 (moderate): Secondary | ICD-10-CM | POA: Diagnosis not present

## 2015-05-29 DIAGNOSIS — M353 Polymyalgia rheumatica: Secondary | ICD-10-CM | POA: Diagnosis not present

## 2015-05-29 DIAGNOSIS — I129 Hypertensive chronic kidney disease with stage 1 through stage 4 chronic kidney disease, or unspecified chronic kidney disease: Secondary | ICD-10-CM | POA: Diagnosis not present

## 2015-05-29 DIAGNOSIS — I5032 Chronic diastolic (congestive) heart failure: Secondary | ICD-10-CM | POA: Diagnosis not present

## 2015-05-29 DIAGNOSIS — I2510 Atherosclerotic heart disease of native coronary artery without angina pectoris: Secondary | ICD-10-CM | POA: Diagnosis not present

## 2015-05-29 DIAGNOSIS — E43 Unspecified severe protein-calorie malnutrition: Secondary | ICD-10-CM | POA: Diagnosis not present

## 2015-05-30 DIAGNOSIS — E43 Unspecified severe protein-calorie malnutrition: Secondary | ICD-10-CM | POA: Diagnosis not present

## 2015-05-30 DIAGNOSIS — N183 Chronic kidney disease, stage 3 (moderate): Secondary | ICD-10-CM | POA: Diagnosis not present

## 2015-05-30 DIAGNOSIS — I2510 Atherosclerotic heart disease of native coronary artery without angina pectoris: Secondary | ICD-10-CM | POA: Diagnosis not present

## 2015-05-30 DIAGNOSIS — I129 Hypertensive chronic kidney disease with stage 1 through stage 4 chronic kidney disease, or unspecified chronic kidney disease: Secondary | ICD-10-CM | POA: Diagnosis not present

## 2015-05-30 DIAGNOSIS — I5032 Chronic diastolic (congestive) heart failure: Secondary | ICD-10-CM | POA: Diagnosis not present

## 2015-05-30 DIAGNOSIS — M353 Polymyalgia rheumatica: Secondary | ICD-10-CM | POA: Diagnosis not present

## 2015-05-31 DIAGNOSIS — L89322 Pressure ulcer of left buttock, stage 2: Secondary | ICD-10-CM | POA: Diagnosis not present

## 2015-05-31 DIAGNOSIS — I5032 Chronic diastolic (congestive) heart failure: Secondary | ICD-10-CM | POA: Diagnosis not present

## 2015-05-31 DIAGNOSIS — Z951 Presence of aortocoronary bypass graft: Secondary | ICD-10-CM | POA: Diagnosis not present

## 2015-05-31 DIAGNOSIS — I129 Hypertensive chronic kidney disease with stage 1 through stage 4 chronic kidney disease, or unspecified chronic kidney disease: Secondary | ICD-10-CM | POA: Diagnosis not present

## 2015-05-31 DIAGNOSIS — N183 Chronic kidney disease, stage 3 (moderate): Secondary | ICD-10-CM | POA: Diagnosis not present

## 2015-05-31 DIAGNOSIS — Z8701 Personal history of pneumonia (recurrent): Secondary | ICD-10-CM | POA: Diagnosis not present

## 2015-05-31 DIAGNOSIS — E43 Unspecified severe protein-calorie malnutrition: Secondary | ICD-10-CM | POA: Diagnosis not present

## 2015-05-31 DIAGNOSIS — I252 Old myocardial infarction: Secondary | ICD-10-CM | POA: Diagnosis not present

## 2015-05-31 DIAGNOSIS — Z87891 Personal history of nicotine dependence: Secondary | ICD-10-CM | POA: Diagnosis not present

## 2015-05-31 DIAGNOSIS — M353 Polymyalgia rheumatica: Secondary | ICD-10-CM | POA: Diagnosis not present

## 2015-05-31 DIAGNOSIS — I2510 Atherosclerotic heart disease of native coronary artery without angina pectoris: Secondary | ICD-10-CM | POA: Diagnosis not present

## 2015-06-01 DIAGNOSIS — L89322 Pressure ulcer of left buttock, stage 2: Secondary | ICD-10-CM | POA: Diagnosis not present

## 2015-06-01 DIAGNOSIS — N183 Chronic kidney disease, stage 3 (moderate): Secondary | ICD-10-CM | POA: Diagnosis not present

## 2015-06-01 DIAGNOSIS — M353 Polymyalgia rheumatica: Secondary | ICD-10-CM | POA: Diagnosis not present

## 2015-06-01 DIAGNOSIS — I5032 Chronic diastolic (congestive) heart failure: Secondary | ICD-10-CM | POA: Diagnosis not present

## 2015-06-01 DIAGNOSIS — I129 Hypertensive chronic kidney disease with stage 1 through stage 4 chronic kidney disease, or unspecified chronic kidney disease: Secondary | ICD-10-CM | POA: Diagnosis not present

## 2015-06-01 DIAGNOSIS — I2510 Atherosclerotic heart disease of native coronary artery without angina pectoris: Secondary | ICD-10-CM | POA: Diagnosis not present

## 2015-06-05 DIAGNOSIS — I5032 Chronic diastolic (congestive) heart failure: Secondary | ICD-10-CM | POA: Diagnosis not present

## 2015-06-05 DIAGNOSIS — I2510 Atherosclerotic heart disease of native coronary artery without angina pectoris: Secondary | ICD-10-CM | POA: Diagnosis not present

## 2015-06-05 DIAGNOSIS — M353 Polymyalgia rheumatica: Secondary | ICD-10-CM | POA: Diagnosis not present

## 2015-06-05 DIAGNOSIS — L89322 Pressure ulcer of left buttock, stage 2: Secondary | ICD-10-CM | POA: Diagnosis not present

## 2015-06-05 DIAGNOSIS — I129 Hypertensive chronic kidney disease with stage 1 through stage 4 chronic kidney disease, or unspecified chronic kidney disease: Secondary | ICD-10-CM | POA: Diagnosis not present

## 2015-06-05 DIAGNOSIS — N183 Chronic kidney disease, stage 3 (moderate): Secondary | ICD-10-CM | POA: Diagnosis not present

## 2015-06-06 DIAGNOSIS — N183 Chronic kidney disease, stage 3 (moderate): Secondary | ICD-10-CM | POA: Diagnosis not present

## 2015-06-06 DIAGNOSIS — R609 Edema, unspecified: Secondary | ICD-10-CM | POA: Diagnosis not present

## 2015-06-06 DIAGNOSIS — D692 Other nonthrombocytopenic purpura: Secondary | ICD-10-CM | POA: Diagnosis not present

## 2015-06-06 DIAGNOSIS — I129 Hypertensive chronic kidney disease with stage 1 through stage 4 chronic kidney disease, or unspecified chronic kidney disease: Secondary | ICD-10-CM | POA: Diagnosis not present

## 2015-06-06 DIAGNOSIS — E039 Hypothyroidism, unspecified: Secondary | ICD-10-CM | POA: Diagnosis not present

## 2015-06-06 DIAGNOSIS — M4806 Spinal stenosis, lumbar region: Secondary | ICD-10-CM | POA: Diagnosis not present

## 2015-06-08 DIAGNOSIS — I129 Hypertensive chronic kidney disease with stage 1 through stage 4 chronic kidney disease, or unspecified chronic kidney disease: Secondary | ICD-10-CM | POA: Diagnosis not present

## 2015-06-08 DIAGNOSIS — L89322 Pressure ulcer of left buttock, stage 2: Secondary | ICD-10-CM | POA: Diagnosis not present

## 2015-06-08 DIAGNOSIS — I5032 Chronic diastolic (congestive) heart failure: Secondary | ICD-10-CM | POA: Diagnosis not present

## 2015-06-08 DIAGNOSIS — I2510 Atherosclerotic heart disease of native coronary artery without angina pectoris: Secondary | ICD-10-CM | POA: Diagnosis not present

## 2015-06-08 DIAGNOSIS — M353 Polymyalgia rheumatica: Secondary | ICD-10-CM | POA: Diagnosis not present

## 2015-06-08 DIAGNOSIS — N183 Chronic kidney disease, stage 3 (moderate): Secondary | ICD-10-CM | POA: Diagnosis not present

## 2015-06-11 DIAGNOSIS — I129 Hypertensive chronic kidney disease with stage 1 through stage 4 chronic kidney disease, or unspecified chronic kidney disease: Secondary | ICD-10-CM | POA: Diagnosis not present

## 2015-06-11 DIAGNOSIS — N183 Chronic kidney disease, stage 3 (moderate): Secondary | ICD-10-CM | POA: Diagnosis not present

## 2015-06-11 DIAGNOSIS — I2510 Atherosclerotic heart disease of native coronary artery without angina pectoris: Secondary | ICD-10-CM | POA: Diagnosis not present

## 2015-06-11 DIAGNOSIS — L89322 Pressure ulcer of left buttock, stage 2: Secondary | ICD-10-CM | POA: Diagnosis not present

## 2015-06-11 DIAGNOSIS — M353 Polymyalgia rheumatica: Secondary | ICD-10-CM | POA: Diagnosis not present

## 2015-06-11 DIAGNOSIS — I5032 Chronic diastolic (congestive) heart failure: Secondary | ICD-10-CM | POA: Diagnosis not present

## 2015-06-13 DIAGNOSIS — I2510 Atherosclerotic heart disease of native coronary artery without angina pectoris: Secondary | ICD-10-CM | POA: Diagnosis not present

## 2015-06-13 DIAGNOSIS — I129 Hypertensive chronic kidney disease with stage 1 through stage 4 chronic kidney disease, or unspecified chronic kidney disease: Secondary | ICD-10-CM | POA: Diagnosis not present

## 2015-06-13 DIAGNOSIS — I5032 Chronic diastolic (congestive) heart failure: Secondary | ICD-10-CM | POA: Diagnosis not present

## 2015-06-13 DIAGNOSIS — N183 Chronic kidney disease, stage 3 (moderate): Secondary | ICD-10-CM | POA: Diagnosis not present

## 2015-06-13 DIAGNOSIS — M353 Polymyalgia rheumatica: Secondary | ICD-10-CM | POA: Diagnosis not present

## 2015-06-13 DIAGNOSIS — L89322 Pressure ulcer of left buttock, stage 2: Secondary | ICD-10-CM | POA: Diagnosis not present

## 2015-06-15 DIAGNOSIS — L89322 Pressure ulcer of left buttock, stage 2: Secondary | ICD-10-CM | POA: Diagnosis not present

## 2015-06-15 DIAGNOSIS — I129 Hypertensive chronic kidney disease with stage 1 through stage 4 chronic kidney disease, or unspecified chronic kidney disease: Secondary | ICD-10-CM | POA: Diagnosis not present

## 2015-06-15 DIAGNOSIS — M353 Polymyalgia rheumatica: Secondary | ICD-10-CM | POA: Diagnosis not present

## 2015-06-15 DIAGNOSIS — I5032 Chronic diastolic (congestive) heart failure: Secondary | ICD-10-CM | POA: Diagnosis not present

## 2015-06-15 DIAGNOSIS — N183 Chronic kidney disease, stage 3 (moderate): Secondary | ICD-10-CM | POA: Diagnosis not present

## 2015-06-15 DIAGNOSIS — I2510 Atherosclerotic heart disease of native coronary artery without angina pectoris: Secondary | ICD-10-CM | POA: Diagnosis not present

## 2015-06-15 NOTE — Patient Outreach (Signed)
[place] [facility]) Care Management  06/15/2015  DABID GODOWN 01-07-1920 [id number]   Referral from [place], assigned Mariann Laster, RN to outreach.  Ronnell Freshwater. [place], [place] Management [place] Assistant Phone: [phone] Fax: [phone]

## 2015-06-19 DIAGNOSIS — M353 Polymyalgia rheumatica: Secondary | ICD-10-CM | POA: Diagnosis not present

## 2015-06-19 DIAGNOSIS — I2510 Atherosclerotic heart disease of native coronary artery without angina pectoris: Secondary | ICD-10-CM | POA: Diagnosis not present

## 2015-06-19 DIAGNOSIS — I5032 Chronic diastolic (congestive) heart failure: Secondary | ICD-10-CM | POA: Diagnosis not present

## 2015-06-19 DIAGNOSIS — N183 Chronic kidney disease, stage 3 (moderate): Secondary | ICD-10-CM | POA: Diagnosis not present

## 2015-06-19 DIAGNOSIS — L89322 Pressure ulcer of left buttock, stage 2: Secondary | ICD-10-CM | POA: Diagnosis not present

## 2015-06-19 DIAGNOSIS — I129 Hypertensive chronic kidney disease with stage 1 through stage 4 chronic kidney disease, or unspecified chronic kidney disease: Secondary | ICD-10-CM | POA: Diagnosis not present

## 2015-06-20 DIAGNOSIS — M150 Primary generalized (osteo)arthritis: Secondary | ICD-10-CM | POA: Diagnosis not present

## 2015-06-20 DIAGNOSIS — M353 Polymyalgia rheumatica: Secondary | ICD-10-CM | POA: Diagnosis not present

## 2015-06-20 DIAGNOSIS — M545 Low back pain: Secondary | ICD-10-CM | POA: Diagnosis not present

## 2015-06-20 DIAGNOSIS — M2550 Pain in unspecified joint: Secondary | ICD-10-CM | POA: Diagnosis not present

## 2015-06-20 DIAGNOSIS — Z79899 Other long term (current) drug therapy: Secondary | ICD-10-CM | POA: Diagnosis not present

## 2015-06-20 DIAGNOSIS — M064 Inflammatory polyarthropathy: Secondary | ICD-10-CM | POA: Diagnosis not present

## 2015-06-21 DIAGNOSIS — N183 Chronic kidney disease, stage 3 (moderate): Secondary | ICD-10-CM | POA: Diagnosis not present

## 2015-06-21 DIAGNOSIS — M353 Polymyalgia rheumatica: Secondary | ICD-10-CM | POA: Diagnosis not present

## 2015-06-21 DIAGNOSIS — L89322 Pressure ulcer of left buttock, stage 2: Secondary | ICD-10-CM | POA: Diagnosis not present

## 2015-06-21 DIAGNOSIS — I129 Hypertensive chronic kidney disease with stage 1 through stage 4 chronic kidney disease, or unspecified chronic kidney disease: Secondary | ICD-10-CM | POA: Diagnosis not present

## 2015-06-21 DIAGNOSIS — I2510 Atherosclerotic heart disease of native coronary artery without angina pectoris: Secondary | ICD-10-CM | POA: Diagnosis not present

## 2015-06-21 DIAGNOSIS — I5032 Chronic diastolic (congestive) heart failure: Secondary | ICD-10-CM | POA: Diagnosis not present

## 2015-06-22 DIAGNOSIS — L89322 Pressure ulcer of left buttock, stage 2: Secondary | ICD-10-CM | POA: Diagnosis not present

## 2015-06-22 DIAGNOSIS — I5032 Chronic diastolic (congestive) heart failure: Secondary | ICD-10-CM | POA: Diagnosis not present

## 2015-06-22 DIAGNOSIS — I129 Hypertensive chronic kidney disease with stage 1 through stage 4 chronic kidney disease, or unspecified chronic kidney disease: Secondary | ICD-10-CM | POA: Diagnosis not present

## 2015-06-22 DIAGNOSIS — N183 Chronic kidney disease, stage 3 (moderate): Secondary | ICD-10-CM | POA: Diagnosis not present

## 2015-06-22 DIAGNOSIS — I2510 Atherosclerotic heart disease of native coronary artery without angina pectoris: Secondary | ICD-10-CM | POA: Diagnosis not present

## 2015-06-22 DIAGNOSIS — M353 Polymyalgia rheumatica: Secondary | ICD-10-CM | POA: Diagnosis not present

## 2015-06-27 ENCOUNTER — Other Ambulatory Visit: Payer: Self-pay

## 2015-06-27 DIAGNOSIS — L89322 Pressure ulcer of left buttock, stage 2: Secondary | ICD-10-CM | POA: Diagnosis not present

## 2015-06-27 DIAGNOSIS — I2510 Atherosclerotic heart disease of native coronary artery without angina pectoris: Secondary | ICD-10-CM | POA: Diagnosis not present

## 2015-06-27 DIAGNOSIS — I5032 Chronic diastolic (congestive) heart failure: Secondary | ICD-10-CM | POA: Diagnosis not present

## 2015-06-27 DIAGNOSIS — M353 Polymyalgia rheumatica: Secondary | ICD-10-CM | POA: Diagnosis not present

## 2015-06-27 DIAGNOSIS — I5031 Acute diastolic (congestive) heart failure: Secondary | ICD-10-CM

## 2015-06-27 DIAGNOSIS — I129 Hypertensive chronic kidney disease with stage 1 through stage 4 chronic kidney disease, or unspecified chronic kidney disease: Secondary | ICD-10-CM | POA: Diagnosis not present

## 2015-06-27 DIAGNOSIS — N183 Chronic kidney disease, stage 3 (moderate): Secondary | ICD-10-CM | POA: Diagnosis not present

## 2015-06-27 IMAGING — CT CT HEAD W/O CM
1 series · 16 of 30 positions shown, 20 images · non-contrast
Comparison: 12/29/12

CLINICAL DATA: Acute onset right facial weakness.  Code stroke.

EXAM:
CT HEAD WITHOUT CONTRAST
TECHNIQUE: Contiguous axial images were obtained from the base of the skull
through the vertex without intravenous contrast.

[Series 2: head 5.0 h30s · axial · 0.44mm/px · z∈[+1165,+1320]mm · 16 of 35 slices shown, 20 images]
[im 2/35  brain]
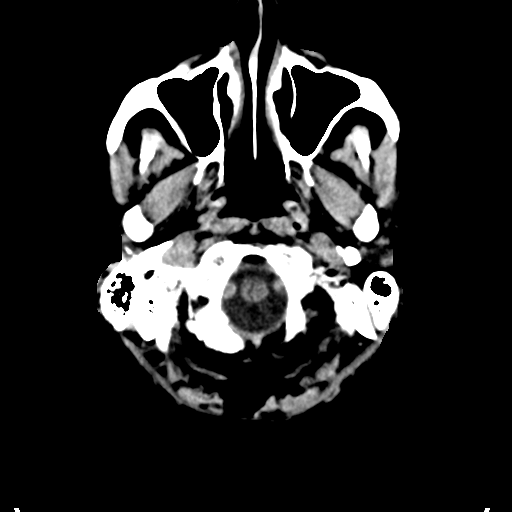
[im 2/35  bone]
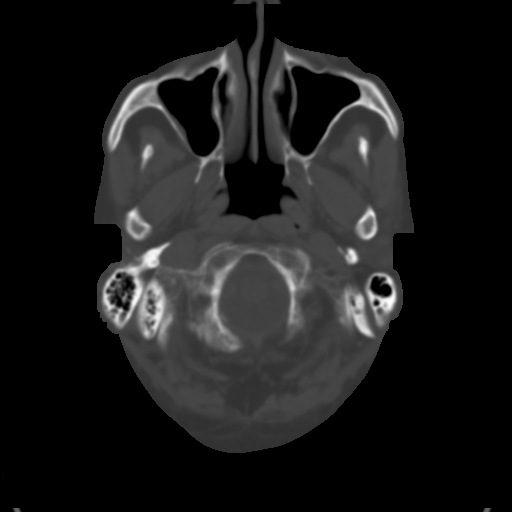
[im 4/35  brain]
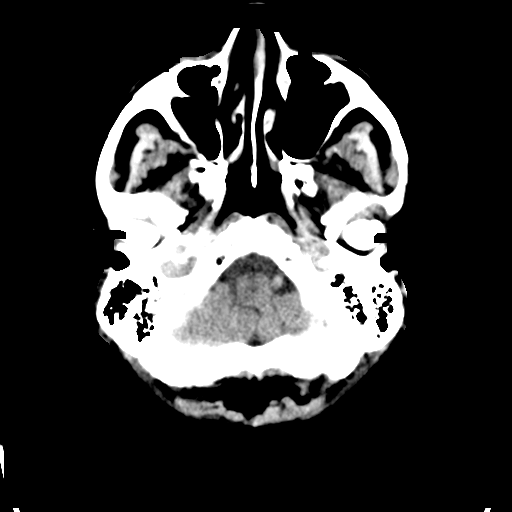
[im 6/35  brain]
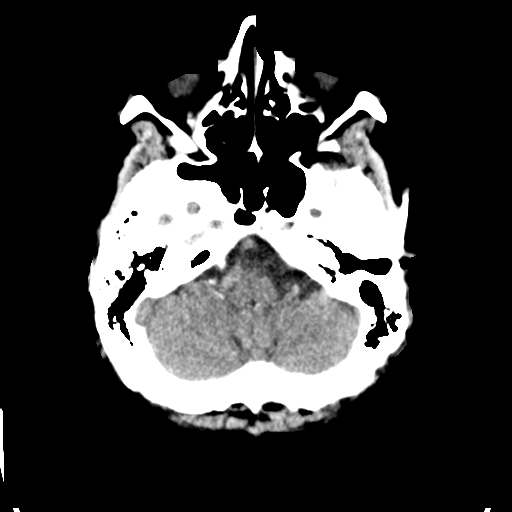
[im 9/35  brain]
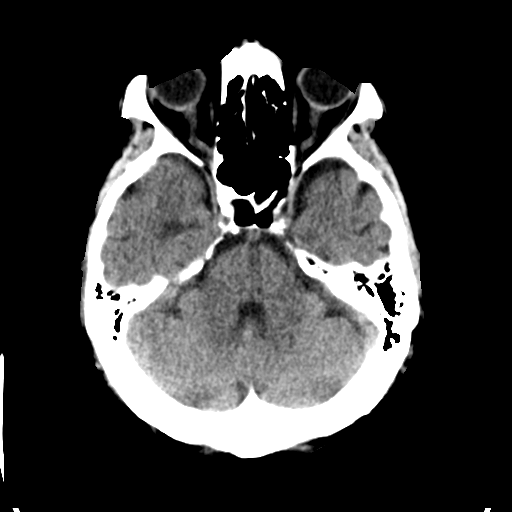
[im 10/35  brain]
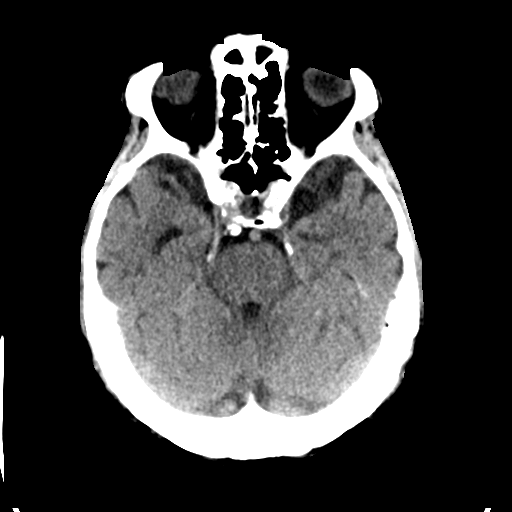
[im 10/35  bone]
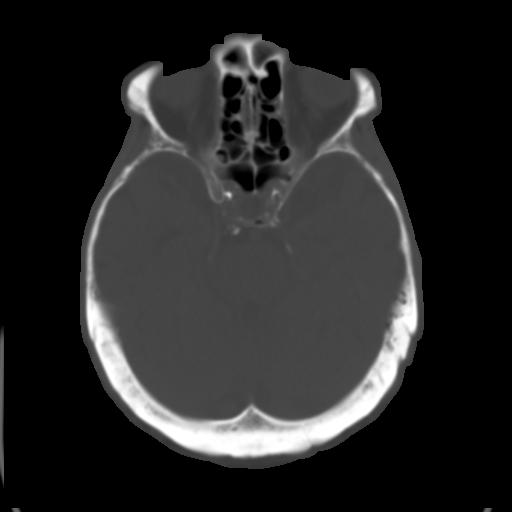
[im 12/35  brain]
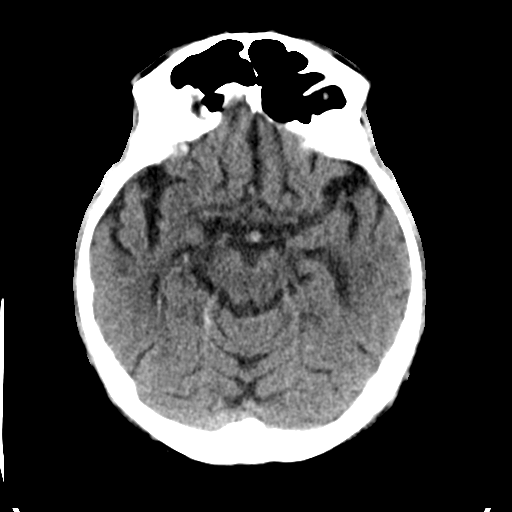
[im 15/35  brain]
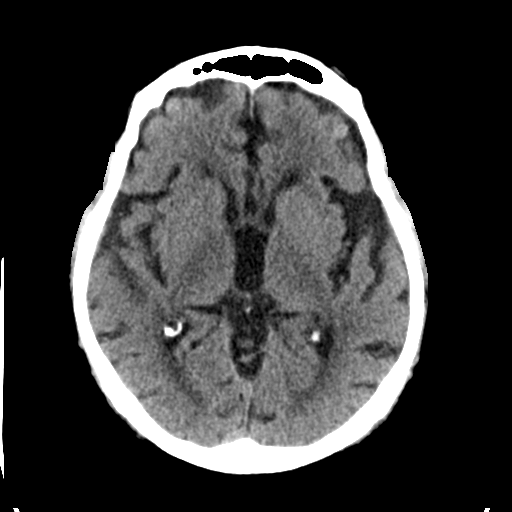
[im 17/35  brain]
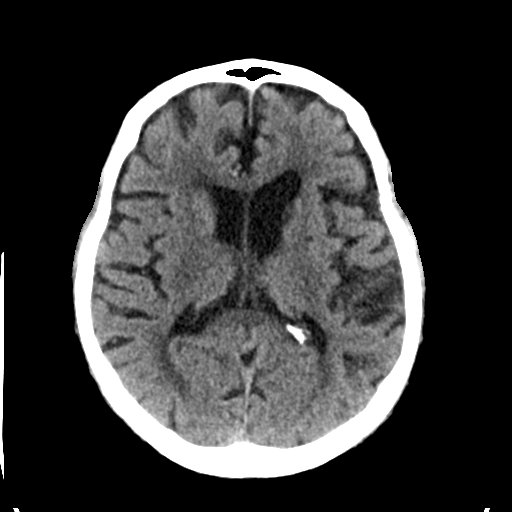
[im 18/35  brain]
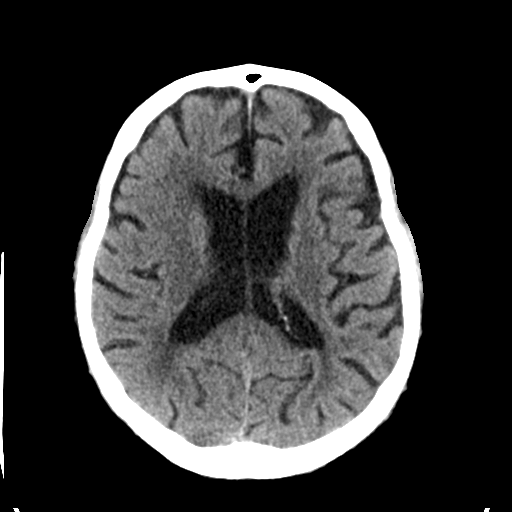
[im 18/35  bone]
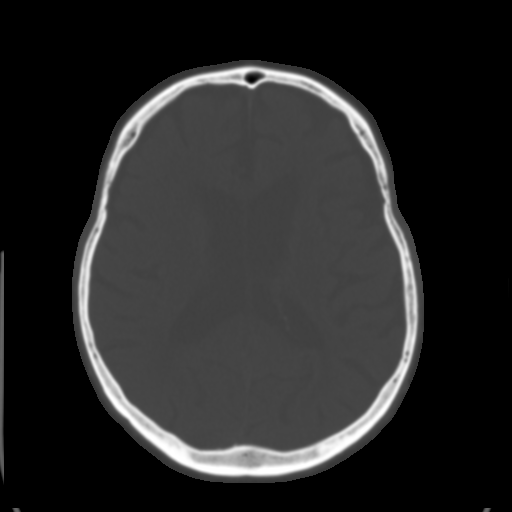
[im 20/35  brain]
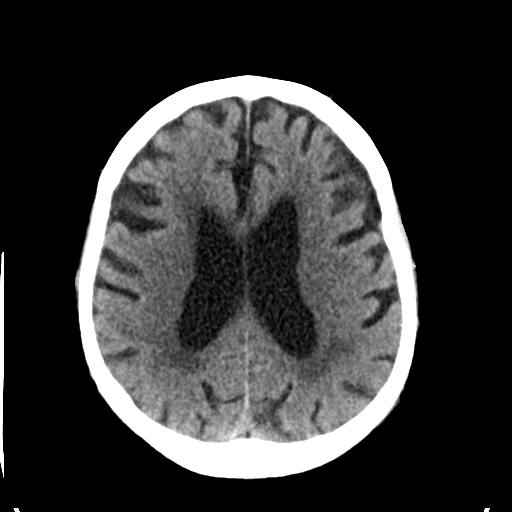
[im 23/35  brain]
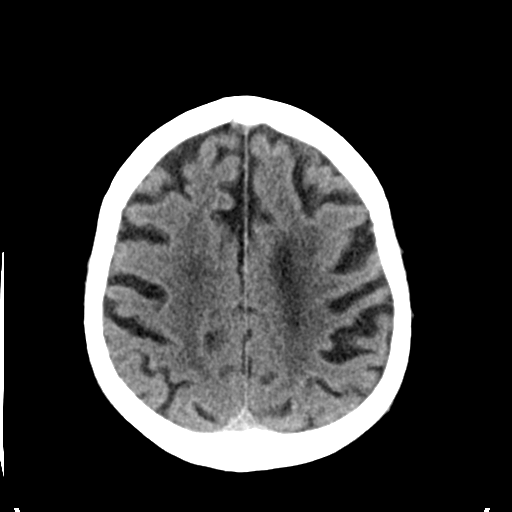
[im 25/35  brain]
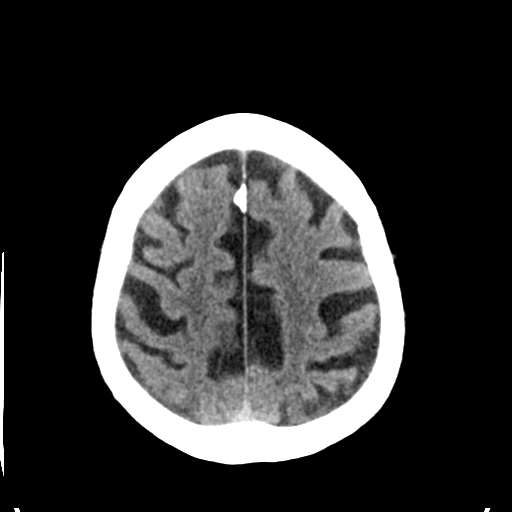
[im 26/35  brain]
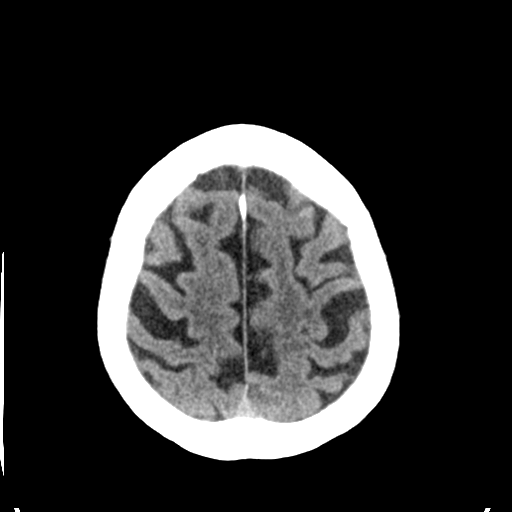
[im 26/35  bone]
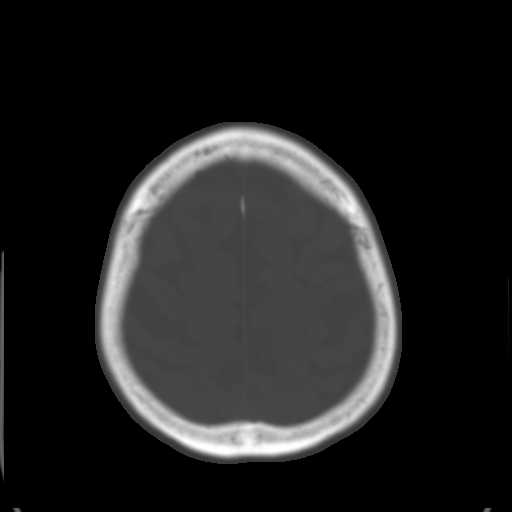
[im 29/35  brain]
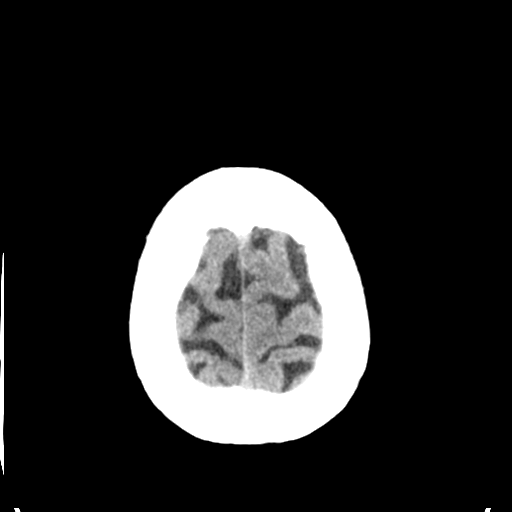
[im 31/35  brain]
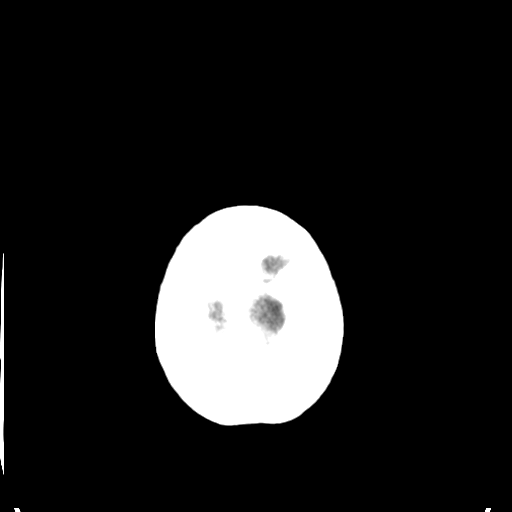
[im 33/35  brain]
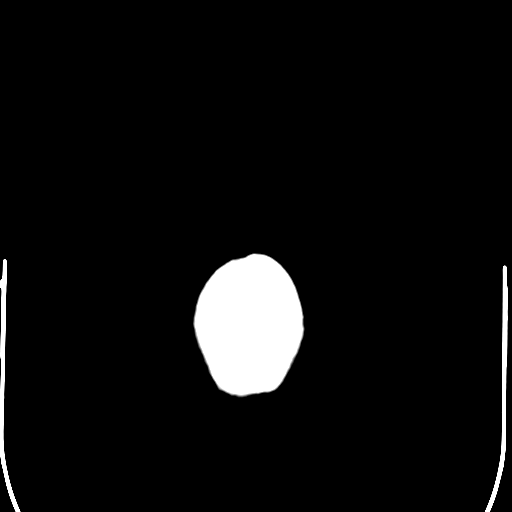

[16 of 30 positions shown; findings below may reference images not displayed]

FINDINGS: There is no evidence of intracranial hemorrhage, brain edema, or
other signs of acute infarction. There is no evidence of
intracranial mass lesion or mass effect. No abnormal extraaxial
fluid collections are identified.

Moderate cerebral atrophy and chronic small vessel disease is stable
in appearance. Old left basal ganglia 10 again noted. Ventricles are
stable in size. No evidence of skull fracture or other bone lesion.
IMPRESSION: No acute intracranial abnormality.

Stable cerebral atrophy and chronic small vessel disease.

These results were called by telephone at the time of interpretation
on 02/20/2014 at [DATE] to Dr. Luisa Goodwin, who verbally
acknowledged these results.

## 2015-06-27 NOTE — Patient Outreach (Addendum)
[place] [facility]) Care Management  06/27/2015  Barry Wright 10-Sep-1920 [id number]  Telephonic Care Management Note  Referral Date: 06/15/15 Referral Source:  MD Referral Issue:  CHF, CAD with H/O 2 admissions, 2 ED visits, 1 SNF in the past 12 months. PCP:  Dr. Felipa Eth -  last appt 2 weeks ago 06/2015  Triage outreach call #1.  Patient reached.   Social:  Patient lives in his home with his wife.  Patient has agency [employer] providing intermittent caregiver services.  Mobility: States has not been able to walk in about 6 months.  Home PT 2 times a week.  Ambulating with a walker or W/C as needed.  Taking sponge baths.  Caregiver:  Wife and agency caregivers.  Falls: None  Transportation:  Caregivers part of the time.   DME: Gilford Rile, W/C [facility] Conditions:  CHF Pain:  Denies any pain   Medications : States he thinks he takes more than 10 but is not sure.  States a Marine scientist prepares his med box and caregivers administer.    States he takes meds as prescribed:  Co-pays affordability:  States it is difficult to pay for medications.  Patient is unable to provide details regarding his co-pay cost but states he has a list with everything written down on it but unable to get to his list.   Consent Patient agrees to [facility] services and Community RN CM home visit.  RN CM advised to please notify MD of any changes in her condition prior to scheduled appt's.   RN CM provided contact name and #, 24-hour nurse line # [phone].   RN CM confirmed patient is aware of 911 services for urgent emergency needs. RN CM advised in next follow-call within 10 days from [place] CM.   Plan: RN CM sent referral to [place] RN CM Level 4. -please assess patients level of care in the home.  Patient may need extended caregiver hours.  Difficult to assess over the phone.  Patients inability to walk limits his ability to provide information about medications and cost concerns.  Please  assess for co-pay concerns and make referrals as assessment indicates needs.  -RN CM notified [facility] agreed to services: case opened.   Mariann Laster, RN, BSN, [facility], CCM  [employer] Management Coordinator [phone] Direct [phone] Cell [phone] Office [phone] Fax

## 2015-06-28 DIAGNOSIS — L89322 Pressure ulcer of left buttock, stage 2: Secondary | ICD-10-CM | POA: Diagnosis not present

## 2015-06-28 DIAGNOSIS — I2510 Atherosclerotic heart disease of native coronary artery without angina pectoris: Secondary | ICD-10-CM | POA: Diagnosis not present

## 2015-06-28 DIAGNOSIS — M353 Polymyalgia rheumatica: Secondary | ICD-10-CM | POA: Diagnosis not present

## 2015-06-28 DIAGNOSIS — I5032 Chronic diastolic (congestive) heart failure: Secondary | ICD-10-CM | POA: Diagnosis not present

## 2015-06-28 DIAGNOSIS — I129 Hypertensive chronic kidney disease with stage 1 through stage 4 chronic kidney disease, or unspecified chronic kidney disease: Secondary | ICD-10-CM | POA: Diagnosis not present

## 2015-06-28 DIAGNOSIS — N183 Chronic kidney disease, stage 3 (moderate): Secondary | ICD-10-CM | POA: Diagnosis not present

## 2015-06-29 DIAGNOSIS — I2510 Atherosclerotic heart disease of native coronary artery without angina pectoris: Secondary | ICD-10-CM | POA: Diagnosis not present

## 2015-06-29 DIAGNOSIS — I129 Hypertensive chronic kidney disease with stage 1 through stage 4 chronic kidney disease, or unspecified chronic kidney disease: Secondary | ICD-10-CM | POA: Diagnosis not present

## 2015-06-29 DIAGNOSIS — L89322 Pressure ulcer of left buttock, stage 2: Secondary | ICD-10-CM | POA: Diagnosis not present

## 2015-06-29 DIAGNOSIS — M353 Polymyalgia rheumatica: Secondary | ICD-10-CM | POA: Diagnosis not present

## 2015-06-29 DIAGNOSIS — I5032 Chronic diastolic (congestive) heart failure: Secondary | ICD-10-CM | POA: Diagnosis not present

## 2015-06-29 DIAGNOSIS — N183 Chronic kidney disease, stage 3 (moderate): Secondary | ICD-10-CM | POA: Diagnosis not present

## 2015-07-03 DIAGNOSIS — R404 Transient alteration of awareness: Secondary | ICD-10-CM | POA: Diagnosis not present

## 2015-07-04 DIAGNOSIS — I5032 Chronic diastolic (congestive) heart failure: Secondary | ICD-10-CM | POA: Diagnosis not present

## 2015-07-04 DIAGNOSIS — L89322 Pressure ulcer of left buttock, stage 2: Secondary | ICD-10-CM | POA: Diagnosis not present

## 2015-07-04 DIAGNOSIS — N183 Chronic kidney disease, stage 3 (moderate): Secondary | ICD-10-CM | POA: Diagnosis not present

## 2015-07-04 DIAGNOSIS — M353 Polymyalgia rheumatica: Secondary | ICD-10-CM | POA: Diagnosis not present

## 2015-07-04 DIAGNOSIS — I129 Hypertensive chronic kidney disease with stage 1 through stage 4 chronic kidney disease, or unspecified chronic kidney disease: Secondary | ICD-10-CM | POA: Diagnosis not present

## 2015-07-04 DIAGNOSIS — I2510 Atherosclerotic heart disease of native coronary artery without angina pectoris: Secondary | ICD-10-CM | POA: Diagnosis not present

## 2015-07-06 DIAGNOSIS — L89322 Pressure ulcer of left buttock, stage 2: Secondary | ICD-10-CM | POA: Diagnosis not present

## 2015-07-06 DIAGNOSIS — I2510 Atherosclerotic heart disease of native coronary artery without angina pectoris: Secondary | ICD-10-CM | POA: Diagnosis not present

## 2015-07-06 DIAGNOSIS — N183 Chronic kidney disease, stage 3 (moderate): Secondary | ICD-10-CM | POA: Diagnosis not present

## 2015-07-06 DIAGNOSIS — M353 Polymyalgia rheumatica: Secondary | ICD-10-CM | POA: Diagnosis not present

## 2015-07-06 DIAGNOSIS — I5032 Chronic diastolic (congestive) heart failure: Secondary | ICD-10-CM | POA: Diagnosis not present

## 2015-07-06 DIAGNOSIS — I129 Hypertensive chronic kidney disease with stage 1 through stage 4 chronic kidney disease, or unspecified chronic kidney disease: Secondary | ICD-10-CM | POA: Diagnosis not present

## 2015-07-11 DIAGNOSIS — M353 Polymyalgia rheumatica: Secondary | ICD-10-CM | POA: Diagnosis not present

## 2015-07-11 DIAGNOSIS — L89322 Pressure ulcer of left buttock, stage 2: Secondary | ICD-10-CM | POA: Diagnosis not present

## 2015-07-11 DIAGNOSIS — N183 Chronic kidney disease, stage 3 (moderate): Secondary | ICD-10-CM | POA: Diagnosis not present

## 2015-07-11 DIAGNOSIS — I5032 Chronic diastolic (congestive) heart failure: Secondary | ICD-10-CM | POA: Diagnosis not present

## 2015-07-11 DIAGNOSIS — I2510 Atherosclerotic heart disease of native coronary artery without angina pectoris: Secondary | ICD-10-CM | POA: Diagnosis not present

## 2015-07-11 DIAGNOSIS — I129 Hypertensive chronic kidney disease with stage 1 through stage 4 chronic kidney disease, or unspecified chronic kidney disease: Secondary | ICD-10-CM | POA: Diagnosis not present

## 2015-07-13 DIAGNOSIS — M353 Polymyalgia rheumatica: Secondary | ICD-10-CM | POA: Diagnosis not present

## 2015-07-13 DIAGNOSIS — I5032 Chronic diastolic (congestive) heart failure: Secondary | ICD-10-CM | POA: Diagnosis not present

## 2015-07-13 DIAGNOSIS — I2510 Atherosclerotic heart disease of native coronary artery without angina pectoris: Secondary | ICD-10-CM | POA: Diagnosis not present

## 2015-07-13 DIAGNOSIS — L89322 Pressure ulcer of left buttock, stage 2: Secondary | ICD-10-CM | POA: Diagnosis not present

## 2015-07-13 DIAGNOSIS — I129 Hypertensive chronic kidney disease with stage 1 through stage 4 chronic kidney disease, or unspecified chronic kidney disease: Secondary | ICD-10-CM | POA: Diagnosis not present

## 2015-07-13 DIAGNOSIS — N183 Chronic kidney disease, stage 3 (moderate): Secondary | ICD-10-CM | POA: Diagnosis not present

## 2015-07-18 DIAGNOSIS — N183 Chronic kidney disease, stage 3 (moderate): Secondary | ICD-10-CM | POA: Diagnosis not present

## 2015-07-18 DIAGNOSIS — I2510 Atherosclerotic heart disease of native coronary artery without angina pectoris: Secondary | ICD-10-CM | POA: Diagnosis not present

## 2015-07-18 DIAGNOSIS — L89322 Pressure ulcer of left buttock, stage 2: Secondary | ICD-10-CM | POA: Diagnosis not present

## 2015-07-18 DIAGNOSIS — I5032 Chronic diastolic (congestive) heart failure: Secondary | ICD-10-CM | POA: Diagnosis not present

## 2015-07-18 DIAGNOSIS — M353 Polymyalgia rheumatica: Secondary | ICD-10-CM | POA: Diagnosis not present

## 2015-07-18 DIAGNOSIS — I129 Hypertensive chronic kidney disease with stage 1 through stage 4 chronic kidney disease, or unspecified chronic kidney disease: Secondary | ICD-10-CM | POA: Diagnosis not present

## 2015-07-20 DIAGNOSIS — I5032 Chronic diastolic (congestive) heart failure: Secondary | ICD-10-CM | POA: Diagnosis not present

## 2015-07-20 DIAGNOSIS — M353 Polymyalgia rheumatica: Secondary | ICD-10-CM | POA: Diagnosis not present

## 2015-07-20 DIAGNOSIS — N183 Chronic kidney disease, stage 3 (moderate): Secondary | ICD-10-CM | POA: Diagnosis not present

## 2015-07-20 DIAGNOSIS — L89322 Pressure ulcer of left buttock, stage 2: Secondary | ICD-10-CM | POA: Diagnosis not present

## 2015-07-20 DIAGNOSIS — I129 Hypertensive chronic kidney disease with stage 1 through stage 4 chronic kidney disease, or unspecified chronic kidney disease: Secondary | ICD-10-CM | POA: Diagnosis not present

## 2015-07-20 DIAGNOSIS — I2510 Atherosclerotic heart disease of native coronary artery without angina pectoris: Secondary | ICD-10-CM | POA: Diagnosis not present

## 2015-07-24 DIAGNOSIS — I5032 Chronic diastolic (congestive) heart failure: Secondary | ICD-10-CM | POA: Diagnosis not present

## 2015-07-24 DIAGNOSIS — L89322 Pressure ulcer of left buttock, stage 2: Secondary | ICD-10-CM | POA: Diagnosis not present

## 2015-07-24 DIAGNOSIS — I129 Hypertensive chronic kidney disease with stage 1 through stage 4 chronic kidney disease, or unspecified chronic kidney disease: Secondary | ICD-10-CM | POA: Diagnosis not present

## 2015-07-24 DIAGNOSIS — M353 Polymyalgia rheumatica: Secondary | ICD-10-CM | POA: Diagnosis not present

## 2015-07-24 DIAGNOSIS — N183 Chronic kidney disease, stage 3 (moderate): Secondary | ICD-10-CM | POA: Diagnosis not present

## 2015-07-24 DIAGNOSIS — I2510 Atherosclerotic heart disease of native coronary artery without angina pectoris: Secondary | ICD-10-CM | POA: Diagnosis not present

## 2015-07-26 DIAGNOSIS — I5032 Chronic diastolic (congestive) heart failure: Secondary | ICD-10-CM | POA: Diagnosis not present

## 2015-07-26 DIAGNOSIS — L89322 Pressure ulcer of left buttock, stage 2: Secondary | ICD-10-CM | POA: Diagnosis not present

## 2015-07-26 DIAGNOSIS — N183 Chronic kidney disease, stage 3 (moderate): Secondary | ICD-10-CM | POA: Diagnosis not present

## 2015-07-26 DIAGNOSIS — I2510 Atherosclerotic heart disease of native coronary artery without angina pectoris: Secondary | ICD-10-CM | POA: Diagnosis not present

## 2015-07-26 DIAGNOSIS — M353 Polymyalgia rheumatica: Secondary | ICD-10-CM | POA: Diagnosis not present

## 2015-07-26 DIAGNOSIS — I129 Hypertensive chronic kidney disease with stage 1 through stage 4 chronic kidney disease, or unspecified chronic kidney disease: Secondary | ICD-10-CM | POA: Diagnosis not present

## 2015-07-30 ENCOUNTER — Encounter: Payer: Self-pay | Admitting: Rehabilitation

## 2015-07-30 ENCOUNTER — Ambulatory Visit: Payer: Medicare Other | Attending: Geriatric Medicine | Admitting: Rehabilitation

## 2015-07-30 DIAGNOSIS — R269 Unspecified abnormalities of gait and mobility: Secondary | ICD-10-CM | POA: Insufficient documentation

## 2015-07-30 DIAGNOSIS — R262 Difficulty in walking, not elsewhere classified: Secondary | ICD-10-CM | POA: Insufficient documentation

## 2015-07-30 NOTE — Therapy (Signed)
[facility] [address], [place], [place], [address] Phone: [phone]   Fax:  [phone]  Physical Therapy Evaluation  Patient Details  Name: Barry Wright MRN: [id number] Date of Birth: Mar 22, 1920 Referring Deborha Moseley:  Lajean Manes, MD  Encounter Date: 07/30/2015      PT End of Session - 07/30/15 1242    Visit Number 1   Date for PT Re-Evaluation 09/10/15   PT Start Time 4010   PT Stop Time 1225   PT Time Calculation (min) 40 min   Equipment Utilized During Treatment Gait belt;Other (comment)  RW   Activity Tolerance Patient tolerated treatment well   Behavior During Therapy [facility] for tasks assessed/performed      Past Medical History  Diagnosis Date  . Chronic back pain   . Hypercholesteremia   . Hypothyroidism   . CAD (coronary artery disease)   . Chronic kidney disease (CKD), stage III (moderate)   . Polymyalgia rheumatica   . Left bundle branch block   . Hx of CABG   . History of nephrectomy   . Internal carotid artery stenosis   . Myocardial infarction   . CHF (congestive heart failure)   . Stroke     Past Surgical History  Procedure Laterality Date  . Coronary artery bypass graft    . Cholecystectomy    . Kidney surgery      kidney removed    There were no vitals filed for this visit.  Visit Diagnosis:  Abnormality of gait - Plan: PT plan of care cert/re-cert  Difficulty walking - Plan: PT plan of care cert/re-cert      Subjective Assessment - 07/30/15 1153    Subjective Pt presents with inability to walk.  Reports he can take about 10 steps with somebody holding onto him.  This has been going on x 6 months.  Had been using a walker and now is using a walker with minA.  Currently has help at home 9-1 and 5-9 and his wife is unable to help him.  Sleeps in a lift chair.  Unable to dress or bathe without caregiver.  MD thinks he should be able to walk again with practice but back  doctor does not think he will be able to per patient.  Had HHPT where he states that the therapist tried to get him to walk x 3 months and he was not able to improve from no walking to 64ft with 3 rest breaks, but did improve his strength   Patient is accompained by: Family member   Limitations Walking;Standing   Diagnostic tests reports no other real health issues.     Patient Stated Goals would like to be able to walk into church   Currently in Pain? No/denies            [facility] PT Assessment - 07/30/15 0001    Assessment   Medical Diagnosis lumbar stenosis   Onset Date/Surgical Date 01/09/15   Next MD Visit unsure   Prior Therapy home health   Precautions   Precautions Fall   Precaution Comments at least minA with gait belt for transfers and gait   Restrictions   Weight Bearing Restrictions No   Balance Screen   Has the patient fallen in the past 6 months No   Has the patient had a decrease in activity level because of a fear of falling?  Yes   Is the patient reluctant to leave their home because of a  fear of falling?  Yes   [place] residence   Living Arrangements Spouse/significant other   Type of [place] to live on main level with bedroom/bathroom   Additional Comments has live in aid   Prior Function   Level of Independence Needs assistance with ADLs   Cognition   Overall Cognitive Status Within Functional Limits for tasks assessed   Attention Focused   Memory Appears intact   Observation/Other Assessments   Focus on Therapeutic Outcomes (FOTO)  100% limited   Functional Tests   Functional tests Sit to Stand   Sit to Stand   Comments minA x 2 or modA x 1 with gait belt from WC   ROM / Strength   AROM / PROM / Strength Strength   Strength   Overall Strength Comments tested seated in WC and within available ROM   Strength Assessment Site Hip;Knee;Ankle   Right/Left Hip Right;Left   Right Hip Flexion 3+/5    Right Hip ABduction 4/5   Right Hip ADduction 3+/5   Left Hip Flexion 3+/5   Left Hip ABduction 4/5   Left Hip ADduction 3+/5   Ambulation/Gait   Ambulation Distance (Feet) 8 Feet   Assistive device Rolling walker   Gait Pattern Step-to pattern;Decreased stride length;Right flexed knee in stance;Left flexed knee in stance;Shuffle   Ambulation Surface Level   Gait Comments minAx2 able to take 15 steps                           PT Education - 07/30/15 1241    Education provided Yes   Education Details POC, HEP to include seated knee extension, heel raises, ball squeeze, and march all in WC 2x per day; x 10 each   Person(s) Educated Patient;Caregiver(s);Spouse   Methods Explanation   Comprehension Verbalized understanding          PT Short Term Goals - 07/30/15 1250    PT SHORT TERM GOAL #1   Title pt will be independent with intial HEP   Time 1   Period Weeks   Status New           PT Long Term Goals - 07/30/15 1250    PT LONG TERM GOAL #1   Title pt will perform sit to stand without assistance at his WC   Time 6   Period Weeks   Status New   PT LONG TERM GOAL #2   Title pt will ambulate x 33ft with RW SBA   Time 6   Period Weeks   Status New   PT LONG TERM GOAL #3   Title pt will tolerate standing activities in the clinic x 37minutes to demonstrate improved endurance   Time 6   Period Weeks   Status New               Plan - 07/30/15 1243    Clinical Impression Statement Pt presents with decreased LE strength bilaterally R>L and decreased mobility due to lumbar stensosis and deconditioning.  Pt was unable to walk at all prior to starting home health and is now able to walk 10-15 steps x 3 with rest breaks with the help of his aide.  He is interested in attempting leg strengthening and gait training to be able to walk into church and around the home, but knows that it may be improve as much as he would like.  Pt will benefit from  skilled therapeutic intervention in order to improve on the following deficits Abnormal gait;Decreased activity tolerance;Decreased endurance;Difficulty walking;Decreased strength   Rehab Potential Fair   PT Frequency 2x / week   PT Duration 6 weeks   PT Treatment/Interventions Gait training;Functional mobility training;Therapeutic exercise;Patient/family education;Passive range of motion   PT Next Visit Plan gait training with RW, standing exercises and seated for LE strength   Consulted and Agree with Plan of Care Patient;Family member/caregiver          G-Codes - 08/07/2015 1246    Functional Assessment Tool Used FOTO   Functional Limitation Mobility: Walking and moving around   Mobility: Walking and Moving Around Current Status [phone]) 0 percent impaired, limited or restricted   Mobility: Walking and Moving Around Goal Status [phone]) At least 1 percent but less than 20 percent impaired, limited or restricted       Problem List Patient Active Problem List   Diagnosis Date Noted  . Generalized weakness 04/13/2015  . Lobar pneumonia due to unspecified organism 04/13/2015  . Prolonged QT interval   . Weakness 04/11/2015  . CAP (community acquired pneumonia) 01/25/2015  . Dehydration 01/25/2015  . Chronic diastolic heart failure 32/20/2542  . Essential hypertension, benign 03/08/2014  . Hypothyroidism 03/08/2014  . Protein-calorie malnutrition, moderate 03/08/2014  . Constipation 03/08/2014  . Physical deconditioning 03/08/2014  . Hypokalemia 02/27/2014  . Abnormal serum protein electrophoresis 01/07/2013  . Chronic kidney disease (CKD), stage III (moderate) 01/03/2013  . Polymyalgia rheumatica 01/03/2013  . Osteopenia 01/03/2013  . Carotid artery disease 01/03/2013  . Spinal stenosis of lumbar region 01/03/2013  . Coronary artery disease 01/03/2013  . Old MI (myocardial infarction) 01/03/2013  . Unspecified transient cerebral ischemia 11/18/2011    Stark Bray, DPT,  CMP 08-07-15, 12:56 PM  [place] [facility] [address] [place], [place], [address] Phone: [phone]   Fax:  [phone]

## 2015-08-03 ENCOUNTER — Ambulatory Visit: Payer: Medicare Other | Admitting: Physical Therapy

## 2015-08-03 ENCOUNTER — Encounter: Payer: Self-pay | Admitting: Physical Therapy

## 2015-08-03 DIAGNOSIS — R262 Difficulty in walking, not elsewhere classified: Secondary | ICD-10-CM

## 2015-08-03 DIAGNOSIS — R269 Unspecified abnormalities of gait and mobility: Secondary | ICD-10-CM

## 2015-08-03 NOTE — Therapy (Signed)
[facility] [address] [place], [place], [address] Phone: [phone]   Fax:  [phone]  Physical Therapy Treatment  Patient Details  Name: Barry Wright MRN: [id number] Date of Birth: 1920-09-19 Referring Provider:  Lajean Manes, MD  Encounter Date: 08/03/2015      PT End of Session - 08/03/15 1155    Visit Number 2   Date for PT Re-Evaluation 09/10/15   PT Start Time 1104   PT Stop Time 1145   PT Time Calculation (min) 41 min   Equipment Utilized During Treatment Gait belt;Other (comment)   Activity Tolerance Patient tolerated treatment well   Behavior During Therapy [facility] for tasks assessed/performed      Past Medical History  Diagnosis Date  . Chronic back pain   . Hypercholesteremia   . Hypothyroidism   . CAD (coronary artery disease)   . Chronic kidney disease (CKD), stage III (moderate)   . Polymyalgia rheumatica   . Left bundle branch block   . Hx of CABG   . History of nephrectomy   . Internal carotid artery stenosis   . Myocardial infarction   . CHF (congestive heart failure)   . Stroke     Past Surgical History  Procedure Laterality Date  . Coronary artery bypass graft    . Cholecystectomy    . Kidney surgery      kidney removed    There were no vitals filed for this visit.  Visit Diagnosis:  Abnormality of gait  Difficulty walking      Subjective Assessment - 08/03/15 1114    Subjective Pt limited with all functional activities needs assist 24/7.    Patient is accompained by: Family member  wife and care taker   Currently in Pain? No/denies                         [facility] Adult PT Treatment/Exercise - 08/03/15 0001    Bed Mobility   Bed Mobility --  pt wishes to be called Barry Wright   Exercises   Exercises Knee/Hip;Lumbar   Lumbar Exercises: Aerobic   UBE (Upper Arm Bike) sitting in WC level 0 x 4 min (2/2)    Lumbar Exercises: Seated   Other Seated  Lumbar Exercises Rows 20# x 10, 5# 2x10 sitting in WC   Other Seated Lumbar Exercises Flexion with 1# 2 x10   Knee/Hip Exercises: Seated   Long Arc Quad Strengthening;AROM;20 reps;10 reps   Marching Strengthening  for 1 min, needs reminders to increase hip flexion   Abduction/Adduction  Strengthening  add with ball 2 x 10 5 sec hold & needs reminders for techni   Sit to [employer] 3 sets;with UE support  mod A 20, 60, 90sec, need vc's for knee and trunkextension                  PT Short Term Goals - 07/30/15 1250    PT SHORT TERM GOAL #1   Title pt will be independent with intial HEP   Time 1   Period Weeks   Status New           PT Long Term Goals - 07/30/15 1250    PT LONG TERM GOAL #1   Title pt will perform sit to stand without assistance at his WC   Time 6   Period Weeks   Status New   PT LONG TERM GOAL #2   Title pt  will ambulate x 64ft with RW SBA   Time 6   Period Weeks   Status New   PT LONG TERM GOAL #3   Title pt will tolerate standing activities in the clinic x 42minutes to demonstrate improved endurance   Time 6   Period Weeks   Status New               Plan - 08/03/15 1155    Clinical Impression Statement Pt presents with decreased trunk and bil LE strength, decr mobility due to lumbar stenosis and deconditioning. Pt was unable to walk all prior to starting home health and is now able to walk 10-15 steps. He is idetermined to be able to walk around the home and into church   Pt will benefit from skilled therapeutic intervention in order to improve on the following deficits Abnormal gait;Decreased activity tolerance;Decreased endurance;Difficulty walking;Decreased strength   Rehab Potential Good   PT Frequency 2x / week   PT Duration 6 weeks   PT Treatment/Interventions Gait training;Functional mobility training;Therapeutic exercise;Patient/family education;Passive range of motion   PT Next Visit Plan gait training with RW, standing  exercises and seated for LE strength   Consulted and Agree with Plan of Care Patient        Problem List Patient Active Problem List   Diagnosis Date Noted  . Generalized weakness 04/13/2015  . Lobar pneumonia due to unspecified organism 04/13/2015  . Prolonged QT interval   . Weakness 04/11/2015  . CAP (community acquired pneumonia) 01/25/2015  . Dehydration 01/25/2015  . Chronic diastolic heart failure 16/08/9603  . Essential hypertension, benign 03/08/2014  . Hypothyroidism 03/08/2014  . Protein-calorie malnutrition, moderate 03/08/2014  . Constipation 03/08/2014  . Physical deconditioning 03/08/2014  . Hypokalemia 02/27/2014  . Abnormal serum protein electrophoresis 01/07/2013  . Chronic kidney disease (CKD), stage III (moderate) 01/03/2013  . Polymyalgia rheumatica 01/03/2013  . Osteopenia 01/03/2013  . Carotid artery disease 01/03/2013  . Spinal stenosis of lumbar region 01/03/2013  . Coronary artery disease 01/03/2013  . Old MI (myocardial infarction) 01/03/2013  . Unspecified transient cerebral ischemia 11/18/2011    NAUMANN-HOUEGNIFIO,Bladimir Auman PTA 08/03/2015, 12:12 PM  [place] [facility] [address] [place], [place], [address] Phone: [phone]   Fax:  [phone]

## 2015-08-09 ENCOUNTER — Ambulatory Visit: Payer: Medicare Other | Admitting: Physical Therapy

## 2015-08-09 ENCOUNTER — Encounter: Payer: Self-pay | Admitting: Physical Therapy

## 2015-08-09 DIAGNOSIS — R269 Unspecified abnormalities of gait and mobility: Secondary | ICD-10-CM | POA: Diagnosis not present

## 2015-08-09 DIAGNOSIS — R262 Difficulty in walking, not elsewhere classified: Secondary | ICD-10-CM

## 2015-08-09 NOTE — Therapy (Signed)
[facility] [address] [place], [place], [address] Phone: [phone]   Fax:  [phone]  Physical Therapy Treatment  Patient Details  Name: Barry Wright MRN: [id number] Date of Birth: 1920-10-24 Referring Provider:  Lajean Manes, MD  Encounter Date: 08/09/2015      PT End of Session - 08/09/15 1118    Visit Number 3   Date for PT Re-Evaluation 09/10/15   PT Start Time 4196   [id number] Utilized During Treatment Gait belt;Other (comment)   Activity Tolerance Patient tolerated treatment well   Behavior During Therapy [facility] for tasks assessed/performed      Past Medical History  Diagnosis Date  . Chronic back pain   . Hypercholesteremia   . Hypothyroidism   . CAD (coronary artery disease)   . Chronic kidney disease (CKD), stage III (moderate)   . Polymyalgia rheumatica   . Left bundle branch block   . Hx of CABG   . History of nephrectomy   . Internal carotid artery stenosis   . Myocardial infarction   . CHF (congestive heart failure)   . Stroke     Past Surgical History  Procedure Laterality Date  . Coronary artery bypass graft    . Cholecystectomy    . Kidney surgery      kidney removed    There were no vitals filed for this visit.  Visit Diagnosis:  Abnormality of gait  Difficulty walking      Subjective Assessment - 08/09/15 1105    Subjective Pt reports performing exercises at home and reports was not feeling sore after last session   Patient is accompained by: Family member  wife and caregiver   Currently in Pain? No/denies                         [facility] Adult PT Treatment/Exercise - 08/09/15 0001    Bed Mobility   Bed Mobility --  pt wishes to be called Barry Wright   Ambulation/Gait   Ambulation Distance (Feet) 4 Feet   Assistive device Rolling walker   Gait Pattern Step-to pattern   Ambulation Surface Level   Gait Comments minAx2 able to take 8 steps   Exercises   Exercises Knee/Hip;Lumbar   Lumbar Exercises: Aerobic   UBE (Upper Arm Bike) sitting in WC level 0 x 6 min (3/3)    Lumbar Exercises: Seated   Other Seated Lumbar Exercises Rows 25# 3x 10, Triceps bil 30# 1x10, sitting in WC   Knee/Hip Exercises: Seated   Long Arc Quad Strengthening;AROM;20 reps;10 reps  needs vc's to incr quadriceps activation   Marching Strengthening  for 1 min, needs reminders fo incr hip flexion   Abduction/Adduction  Strengthening  using blue ball 2 x10   Sit to [place] 3 sets  x with min A and vc's for handplacement                  PT Short Term Goals - 08/09/15 1146    PT SHORT TERM GOAL #1   Title pt will be independent with intial HEP   Time 1   Period Weeks   Status Achieved           PT Long Term Goals - 08/09/15 1146    PT LONG TERM GOAL #1   Title pt will perform sit to stand without assistance at his WC   Time 6   Period Weeks   Status On-going  PT LONG TERM GOAL #2   Title pt will ambulate x 57ft with RW SBA   Time 6   Period Weeks   Status On-going   PT LONG TERM GOAL #3   Title pt will tolerate standing activities in the clinic x 61minutes to demonstrate improved endurance   Time 6   Period Weeks   Status On-going               Plan - 08/09/15 1118    Clinical Impression Statement Pt with head protraction and rounded shoulders weak trunk muscles, decr mobility due to lumbar stenosis and deconditioning. Pt. was able to take 4 minin steps with RW and plus 2 Assist (one person pushing W/C behind). Pt will continue to benefit from skilled PT to address weakness B LE, endurance.   Pt will benefit from skilled therapeutic intervention in order to improve on the following deficits Abnormal gait;Decreased activity tolerance;Decreased endurance;Difficulty walking;Decreased strength   Rehab Potential Good   PT Frequency 2x / week   PT Duration 6 weeks   PT Treatment/Interventions Gait training;Functional  mobility training;Therapeutic exercise;Patient/family education;Passive range of motion   PT Next Visit Plan continue with B LE & UE stength for use of RW, may try standing exercises continue UBE   Consulted and Agree with Plan of Care Patient        Problem List Patient Active Problem List   Diagnosis Date Noted  . Generalized weakness 04/13/2015  . Lobar pneumonia due to unspecified organism 04/13/2015  . Prolonged QT interval   . Weakness 04/11/2015  . CAP (community acquired pneumonia) 01/25/2015  . Dehydration 01/25/2015  . Chronic diastolic heart failure 62/83/1517  . Essential hypertension, benign 03/08/2014  . Hypothyroidism 03/08/2014  . Protein-calorie malnutrition, moderate 03/08/2014  . Constipation 03/08/2014  . Physical deconditioning 03/08/2014  . Hypokalemia 02/27/2014  . Abnormal serum protein electrophoresis 01/07/2013  . Chronic kidney disease (CKD), stage III (moderate) 01/03/2013  . Polymyalgia rheumatica 01/03/2013  . Osteopenia 01/03/2013  . Carotid artery disease 01/03/2013  . Spinal stenosis of lumbar region 01/03/2013  . Coronary artery disease 01/03/2013  . Old MI (myocardial infarction) 01/03/2013  . Unspecified transient cerebral ischemia 11/18/2011    NAUMANN-HOUEGNIFIO,ELKE PTA 08/09/2015, 1:28 PM  [facility] [address] [place], [place], [address] Phone: [phone]   Fax:  [phone]

## 2015-08-10 DIAGNOSIS — R609 Edema, unspecified: Secondary | ICD-10-CM | POA: Diagnosis not present

## 2015-08-10 DIAGNOSIS — M069 Rheumatoid arthritis, unspecified: Secondary | ICD-10-CM | POA: Diagnosis not present

## 2015-08-10 DIAGNOSIS — Q253 Supravalvular aortic stenosis: Secondary | ICD-10-CM | POA: Diagnosis not present

## 2015-08-10 DIAGNOSIS — E039 Hypothyroidism, unspecified: Secondary | ICD-10-CM | POA: Diagnosis not present

## 2015-08-10 DIAGNOSIS — M4806 Spinal stenosis, lumbar region: Secondary | ICD-10-CM | POA: Diagnosis not present

## 2015-08-14 ENCOUNTER — Ambulatory Visit: Payer: Medicare Other | Admitting: Physical Therapy

## 2015-08-15 DIAGNOSIS — M7750 Other enthesopathy of unspecified foot: Secondary | ICD-10-CM | POA: Diagnosis not present

## 2015-08-15 DIAGNOSIS — M2041 Other hammer toe(s) (acquired), right foot: Secondary | ICD-10-CM | POA: Diagnosis not present

## 2015-08-15 DIAGNOSIS — M79675 Pain in left toe(s): Secondary | ICD-10-CM | POA: Diagnosis not present

## 2015-08-15 DIAGNOSIS — M792 Neuralgia and neuritis, unspecified: Secondary | ICD-10-CM | POA: Diagnosis not present

## 2015-08-15 DIAGNOSIS — M2042 Other hammer toe(s) (acquired), left foot: Secondary | ICD-10-CM | POA: Diagnosis not present

## 2015-08-16 ENCOUNTER — Ambulatory Visit: Payer: Medicare Other | Attending: Geriatric Medicine | Admitting: Physical Therapy

## 2015-08-16 ENCOUNTER — Encounter: Payer: Self-pay | Admitting: Physical Therapy

## 2015-08-16 DIAGNOSIS — R269 Unspecified abnormalities of gait and mobility: Secondary | ICD-10-CM

## 2015-08-16 DIAGNOSIS — R262 Difficulty in walking, not elsewhere classified: Secondary | ICD-10-CM | POA: Diagnosis not present

## 2015-08-16 NOTE — Therapy (Signed)
[facility] [address], [place], [place], [address] Phone: [phone]   Fax:  [phone]  Physical Therapy Treatment  Patient Details  Name: Barry Wright MRN: [id number] Date of Birth: 03/30/1920 Referring Provider:  Lajean Manes, MD  Encounter Date: 08/16/2015      PT End of Session - 08/16/15 1226    Visit Number 4   Date for PT Re-Evaluation 09/10/15   PT Start Time 2409   PT Stop Time 1232   PT Time Calculation (min) 43 min   Equipment Utilized During Treatment Gait belt;Other (comment)   Activity Tolerance Patient tolerated treatment well   Behavior During Therapy [facility] for tasks assessed/performed      Past Medical History  Diagnosis Date  . Chronic back pain   . Hypercholesteremia   . Hypothyroidism   . CAD (coronary artery disease)   . Chronic kidney disease (CKD), stage III (moderate)   . Polymyalgia rheumatica ([place])   . Left bundle branch block   . Hx of CABG   . History of nephrectomy   . Internal carotid artery stenosis   . Myocardial infarction ([place])   . CHF (congestive heart failure) ([place])   . Stroke [facility])     Past Surgical History  Procedure Laterality Date  . Coronary artery bypass graft    . Cholecystectomy    . Kidney surgery      kidney removed    There were no vitals filed for this visit.  Visit Diagnosis:  Abnormality of gait  Difficulty walking      Subjective Assessment - 08/16/15 1339    Subjective Pt reports after PT feels very tired and is taking a nap.    Patient is accompained by: Family member  wife and caregiver   Currently in Pain? No/denies                         [facility] Adult PT Treatment/Exercise - 08/16/15 0001    Ambulation/Gait   Ambulation Distance (Feet) 12 Feet  11, and 16 feet ambulated on carpet and tiles   Assistive device Rolling walker   Gait Pattern Step-to pattern   Ambulation Surface Level  and carpet   Gait  Comments minAx2 able to take up to 16 feet   Exercises   Exercises Knee/Hip;Lumbar   Lumbar Exercises: Aerobic   UBE (Upper Arm Bike) sitting in WC level 0 x 6 min (3/3)    Lumbar Exercises: Seated   Other Seated Lumbar Exercises Rows 30# 3x 10, stting in WC   Knee/Hip Exercises: Seated   Long Arc Quad Strengthening;AROM;20 reps;10 reps  needs vc's for technique, pt advised to perform at home   Marching Strengthening  2 x 10, pt re-educated on practicing at home   Sit to [facility] 3 sets;with UE support                  PT Short Term Goals - 08/16/15 1230    PT SHORT TERM GOAL #1   Title pt will be independent with intial HEP   Time 1   Period Weeks   Status Achieved           PT Long Term Goals - 08/16/15 1230    PT LONG TERM GOAL #1   Title pt will perform sit to stand without assistance at his WC   Time 6   Period Weeks   Status On-going   PT  LONG TERM GOAL #2   Title pt will ambulate x 41ft with RW SBA   Time 6   Period Weeks   Status On-going   PT LONG TERM GOAL #3   Title pt will tolerate standing activities in the clinic x 34minutes to demonstrate improved endurance   Time 6   Period Weeks   Status On-going               Plan - 08/16/15 1227    Clinical Impression Statement Pt was able to ambulate in PT clinic up to 16 feet, (WC pusshed behind), pt with increased trunk flexion and decreased extension of bil knees, but no c/o of pain. Pt with lumbar stenoise and deconditioning. Pt will continue to benefit from skilled PT     Pt will benefit from skilled therapeutic intervention in order to improve on the following deficits Abnormal gait;Decreased activity tolerance;Decreased endurance;Difficulty walking;Decreased strength   Rehab Potential Good   PT Frequency 2x / week   PT Duration 6 weeks   PT Treatment/Interventions Gait training;Functional mobility training;Therapeutic exercise;Patient/family education;Passive range of motion   PT Next Visit  Plan continue with ambulation, with strength B UE/LE, may try standing exercises continue with UBE   Consulted and Agree with Plan of Care Patient        Problem List Patient Active Problem List   Diagnosis Date Noted  . Generalized weakness 04/13/2015  . Lobar pneumonia due to unspecified organism 04/13/2015  . Prolonged QT interval   . Weakness 04/11/2015  . CAP (community acquired pneumonia) 01/25/2015  . Dehydration 01/25/2015  . Chronic diastolic heart failure ([place]) 06/19/2014  . Essential hypertension, benign 03/08/2014  . Hypothyroidism 03/08/2014  . Protein-calorie malnutrition, moderate ([place]) 03/08/2014  . Constipation 03/08/2014  . Physical deconditioning 03/08/2014  . Hypokalemia 02/27/2014  . Abnormal serum protein electrophoresis 01/07/2013  . Chronic kidney disease (CKD), stage III (moderate) 01/03/2013  . Polymyalgia rheumatica ([place]) 01/03/2013  . Osteopenia 01/03/2013  . Carotid artery disease ([place]) 01/03/2013  . Spinal stenosis of lumbar region 01/03/2013  . Coronary artery disease 01/03/2013  . Old MI (myocardial infarction) 01/03/2013  . Unspecified transient cerebral ischemia 11/18/2011    NAUMANN-HOUEGNIFIO,Tysen Roesler PTA 08/16/2015, 1:45 PM  [facility] [address], [place], [place], [address] Phone: [phone]   Fax:  [phone]

## 2015-08-21 ENCOUNTER — Encounter: Payer: Self-pay | Admitting: Physical Therapy

## 2015-08-21 ENCOUNTER — Ambulatory Visit: Payer: Medicare Other | Admitting: Physical Therapy

## 2015-08-21 DIAGNOSIS — R269 Unspecified abnormalities of gait and mobility: Secondary | ICD-10-CM

## 2015-08-21 DIAGNOSIS — R262 Difficulty in walking, not elsewhere classified: Secondary | ICD-10-CM

## 2015-08-21 NOTE — Therapy (Signed)
[facility] [address], [place], [place], [address] Phone: [phone]   Fax:  [phone]  Physical Therapy Treatment  Patient Details  Name: Barry Wright MRN: [id number] Date of Birth: 1920/06/19 Referring Provider:  Lajean Manes, MD  Encounter Date: 08/21/2015      PT End of Session - 08/21/15 1118    Visit Number 5   Number of Visits 10   Date for PT Re-Evaluation 09/10/15   PT Start Time 1100   PT Stop Time 1145   PT Time Calculation (min) 45 min   Equipment Utilized During Treatment Gait belt;Other (comment)   Activity Tolerance Patient tolerated treatment well   Behavior During Therapy [facility] for tasks assessed/performed      Past Medical History  Diagnosis Date  . Chronic back pain   . Hypercholesteremia   . Hypothyroidism   . CAD (coronary artery disease)   . Chronic kidney disease (CKD), stage III (moderate)   . Polymyalgia rheumatica ([place])   . Left bundle branch block   . Hx of CABG   . History of nephrectomy   . Internal carotid artery stenosis   . Myocardial infarction ([place])   . CHF (congestive heart failure) ([place])   . Stroke [facility])     Past Surgical History  Procedure Laterality Date  . Coronary artery bypass graft    . Cholecystectomy    . Kidney surgery      kidney removed    There were no vitals filed for this visit.  Visit Diagnosis:  Abnormality of gait  Difficulty walking      Subjective Assessment - 08/21/15 1103    Subjective I'm doing a lot of exercises at home.    Patient is accompained by: Family member  wife and  caregiver   Currently in Pain? No/denies                         [facility] Adult PT Treatment/Exercise - 08/21/15 0001    Ambulation/Gait   Ambulation Distance (Feet) 16 Feet  30,    Assistive device Rolling walker   Gait Pattern Step-to pattern   Ambulation Surface Level   Gait velocity pt with increased pace   Gait Comments  minAx2 able to take up to 16 feet  needs reminders for extension of knees and trunk extension   Exercises   Exercises Knee/Hip;Lumbar   Lumbar Exercises: Aerobic   UBE (Upper Arm Bike) sitting in WC level 0 x 6 min (3/3)    Lumbar Exercises: Seated   Other Seated Lumbar Exercises Rows 35# 3x 10, stting in WC   Knee/Hip Exercises: Standing   Forward Step Up Right;10 reps;Hand Hold: 2;Step Height: 6"  TAPPING able to perform with Rt, but unable to tap left.   Knee/Hip Exercises: Seated   Long Arc Quad Strengthening;AROM;20 reps;10 reps  caregiver present, so she can assist at home   Marching Strengthening   Sit to [facility] 3 sets;with UE support                  PT Short Term Goals - 08/16/15 1230    PT SHORT TERM GOAL #1   Title pt will be independent with intial HEP   Time 1   Period Weeks   Status Achieved           PT Long Term Goals - 08/21/15 1128    PT LONG TERM GOAL #1  Title pt will perform sit to stand without assistance at his WC   Time 6   Period Weeks   Status On-going   PT LONG TERM GOAL #2   Title pt will ambulate x 96f with RW SBA  abel to walk up to 30 feet, but still needs min A   Time 6   Period Weeks   Status Partially Met   PT LONG TERM GOAL #3   Title pt will tolerate standing activities in the clinic x 161mutes to demonstrate improved endurance   Time 6   Period Weeks   Status On-going               Plan - 08/21/15 1118    Clinical Impression Statement Pt was able to increase walking distance with RW in gym on level surface to 3076f, pt has no complain of pain. Pt with lumbar stenosis and deconditioning. Pt will continue to benefit from skilled PT.   Pt will benefit from skilled therapeutic intervention in order to improve on the following deficits Abnormal gait;Decreased activity tolerance;Decreased endurance;Difficulty walking;Decreased strength   Rehab Potential Good   PT Frequency 2x / week   PT Duration 6 weeks   PT  Treatment/Interventions Gait training;Functional mobility training;Therapeutic exercise;Patient/family education;Passive range of motion   PT Next Visit Plan continue with ambulation,  strength B UE/LE, may try standing exercises continue with UBE   Consulted and Agree with Plan of Care Patient        Problem List Patient Active Problem List   Diagnosis Date Noted  . Generalized weakness 04/13/2015  . Lobar pneumonia due to unspecified organism 04/13/2015  . Prolonged QT interval   . Weakness 04/11/2015  . CAP (community acquired pneumonia) 01/25/2015  . Dehydration 01/25/2015  . Chronic diastolic heart failure (HCCMarshallberg8/08/2014  . Essential hypertension, benign 03/08/2014  . Hypothyroidism 03/08/2014  . Protein-calorie malnutrition, moderate (HCCMicro4/29/2015  . Constipation 03/08/2014  . Physical deconditioning 03/08/2014  . Hypokalemia 02/27/2014  . Abnormal serum protein electrophoresis 01/07/2013  . Chronic kidney disease (CKD), stage III (moderate) 01/03/2013  . Polymyalgia rheumatica (HCCTipt[place]2/24/2014  . Osteopenia 01/03/2013  . Carotid artery disease (HCCDex[place]2/24/2014  . Spinal stenosis of lumbar region 01/03/2013  . Coronary artery disease 01/03/2013  . Old MI (myocardial infarction) 01/03/2013  . Unspecified transient cerebral ischemia 11/18/2011    NAUMANN-HOUEGNIFIO,Dontrell Stuck PTA 08/21/2015, 11:47 AM  [facility] [address],[id number]: [phone]:  [phone]

## 2015-08-23 ENCOUNTER — Ambulatory Visit: Payer: Medicare Other | Admitting: Physical Therapy

## 2015-08-23 ENCOUNTER — Encounter: Payer: Self-pay | Admitting: Physical Therapy

## 2015-08-23 DIAGNOSIS — R262 Difficulty in walking, not elsewhere classified: Secondary | ICD-10-CM | POA: Diagnosis not present

## 2015-08-23 DIAGNOSIS — R269 Unspecified abnormalities of gait and mobility: Secondary | ICD-10-CM | POA: Diagnosis not present

## 2015-08-23 NOTE — Therapy (Signed)
[facility] [address], [place], [place], [address] Phone: [phone]   Fax:  [phone]  Physical Therapy Treatment  Patient Details  Name: Barry Wright MRN: [id number] Date of Birth: 07/14/1920 Referring Provider:  Lajean Manes, MD  Encounter Date: 08/23/2015      PT End of Session - 08/23/15 1139    Visit Number 6   Number of Visits 10   Date for PT Re-Evaluation 09/10/15   PT Start Time 0141   PT Stop Time 1146   PT Time Calculation (min) 44 min   Equipment Utilized During Treatment Gait belt;Other (comment)   Activity Tolerance Patient tolerated treatment well   Behavior During Therapy [facility] for tasks assessed/performed      Past Medical History  Diagnosis Date  . Chronic back pain   . Hypercholesteremia   . Hypothyroidism   . CAD (coronary artery disease)   . Chronic kidney disease (CKD), stage III (moderate)   . Polymyalgia rheumatica ([place])   . Left bundle branch block   . Hx of CABG   . History of nephrectomy   . Internal carotid artery stenosis   . Myocardial infarction ([place])   . CHF (congestive heart failure) ([place])   . Stroke [facility])     Past Surgical History  Procedure Laterality Date  . Coronary artery bypass graft    . Cholecystectomy    . Kidney surgery      kidney removed    There were no vitals filed for this visit.  Visit Diagnosis:  Abnormality of gait  Difficulty walking      Subjective Assessment - 08/23/15 1122    Subjective I do my exercises at home, had no chance to practice tapping up on stairs   Patient is accompained by: Family member  caregiver Lewie Chamber and wife in the waiting room   Currently in Pain? No/denies                         [facility] Adult PT Treatment/Exercise - 08/23/15 0001    Ambulation/Gait   Ambulation Distance (Feet) 20 Feet  x 2 unable to incr distance today   Assistive device Rolling walker   Gait Pattern Step-to pattern    Ambulation Surface Level   Gait velocity pt slow today   Gait Comments minAx2 able to take up to [facility]   Exercises   Exercises Knee/Hip;Lumbar   Lumbar Exercises: Aerobic   UBE (Upper Arm Bike) sitting in WC level 0 x 6 min (3/3)    Lumbar Exercises: Seated   Other Seated Lumbar Exercises Rows 35# 3x 10, stting in WC   Knee/Hip Exercises: Standing   Forward Step Up Right;10 reps;Hand Hold: 2;Step Height: 6"  unable to perform tapping with left LE   Knee/Hip Exercises: Seated   Long Arc Quad Strengthening;AROM;20 reps;10 reps   Marching Strengthening;2 sets;10 reps   Abduction/Adduction  Strengthening;2 sets;10 reps  5 sec hold with blue ball into ADD   Sit to [employer] 3 sets;with UE support                  PT Short Term Goals - 08/16/15 1230    PT SHORT TERM GOAL #1   Title pt will be independent with intial HEP   Time 1   Period Weeks   Status Achieved           PT Long Term Goals - 08/21/15 1128  PT LONG TERM GOAL #1   Title pt will perform sit to stand without assistance at his WC   Time 6   Period Weeks   Status On-going   PT LONG TERM GOAL #2   Title pt will ambulate x 84f with RW SBA  abel to walk up to 30 feet, but still needs min A   Time 6   Period Weeks   Status Partially Met   PT LONG TERM GOAL #3   Title pt will tolerate standing activities in the clinic x 140mutes to demonstrate improved endurance   Time 6   Period Weeks   Status On-going               Plan - 08/23/15 1139    Clinical Impression Statement Pt was not so strong today with ambulation only able to walk 2063f. Pt with lumbar stenosis and deconditioning. Pt will continue to benefit from skilled PT    Pt will benefit from skilled therapeutic intervention in order to improve on the following deficits Abnormal gait;Decreased activity tolerance;Decreased endurance;Difficulty walking;Decreased strength   Rehab Potential Good   PT Frequency 2x / week   PT Duration 6  weeks   PT Treatment/Interventions Gait training;Functional mobility training;Therapeutic exercise;Patient/family education;Passive range of motion   PT Next Visit Plan continue with ambulation,  strength B UE/LE, may try standing exercises continue with UBE   Consulted and Agree with Plan of Care Patient        Problem List Patient Active Problem List   Diagnosis Date Noted  . Generalized weakness 04/13/2015  . Lobar pneumonia due to unspecified organism 04/13/2015  . Prolonged QT interval   . Weakness 04/11/2015  . CAP (community acquired pneumonia) 01/25/2015  . Dehydration 01/25/2015  . Chronic diastolic heart failure (HCCSkidaway Island8/08/2014  . Essential hypertension, benign 03/08/2014  . Hypothyroidism 03/08/2014  . Protein-calorie malnutrition, moderate (HCCGlynd[place]4/29/2015  . Constipation 03/08/2014  . Physical deconditioning 03/08/2014  . Hypokalemia 02/27/2014  . Abnormal serum protein electrophoresis 01/07/2013  . Chronic kidney disease (CKD), stage III (moderate) 01/03/2013  . Polymyalgia rheumatica (HCCQuemado2/24/2014  . Osteopenia 01/03/2013  . Carotid artery disease (HCCCrom[place]2/24/2014  . Spinal stenosis of lumbar region 01/03/2013  . Coronary artery disease 01/03/2013  . Old MI (myocardial infarction) 01/03/2013  . Unspecified transient cerebral ischemia 11/18/2011    NAUMANN-HOUEGNIFIO,Maxi Rodas PTA 08/23/2015, 11:45 AM  [facility] [id number]: [phone]:  [phone]

## 2015-08-28 ENCOUNTER — Encounter: Payer: Self-pay | Admitting: Physical Therapy

## 2015-08-28 ENCOUNTER — Ambulatory Visit: Payer: Medicare Other | Admitting: Physical Therapy

## 2015-08-28 DIAGNOSIS — R269 Unspecified abnormalities of gait and mobility: Secondary | ICD-10-CM

## 2015-08-28 DIAGNOSIS — R262 Difficulty in walking, not elsewhere classified: Secondary | ICD-10-CM

## 2015-08-28 NOTE — Therapy (Signed)
[facility] [address], [place], [place], [address] Phone: [phone]   Fax:  [phone]  Physical Therapy Treatment  Patient Details  Name: [name] MRN: [id number] Date of Birth: [birth year] No Data Recorded  Encounter Date: 08/28/2015      PT End of Session - 08/28/15 1125    Visit Number 7   Number of Visits 10   Date for PT Re-Evaluation 09/10/15   PT Start Time 1107   PT Stop Time 9735   PT Time Calculation (min) 41 min   Equipment Utilized During Treatment Gait belt;Other (comment)   Activity Tolerance Patient tolerated treatment well   Behavior During Therapy [facility] for tasks assessed/performed      Past Medical History  Diagnosis Date  . Chronic back pain   . Hypercholesteremia   . Hypothyroidism   . CAD (coronary artery disease)   . Chronic kidney disease (CKD), stage III (moderate)   . Polymyalgia rheumatica ([place])   . Left bundle branch block   . Hx of CABG   . History of nephrectomy   . Internal carotid artery stenosis   . Myocardial infarction ([place])   . CHF (congestive heart failure) ([place])   . Stroke [facility])     Past Surgical History  Procedure Laterality Date  . Coronary artery bypass graft    . Cholecystectomy    . Kidney surgery      kidney removed    There were no vitals filed for this visit.  Visit Diagnosis:  Abnormality of gait  Difficulty walking      Subjective Assessment - 08/28/15 1116    Subjective Pt reports feeling tired from the transfer out of the car into W/C. Caregiver [place] reports she noticed improvemnt with slidingboard transfers at home, due to incr strength and confidence.    Patient is accompained by: Family member  wife and caregiver [place]   Currently in Pain? No/denies                         [facility] Adult PT Treatment/Exercise - 08/28/15 0001    Bed Mobility   Bed Mobility --  Pt wishes to be called Barry Wright   Ambulation/Gait   Ambulation Distance (Feet) 6 Feet   Assistive device Rolling walker   Gait Pattern Step-to pattern   Ambulation Surface Level   Gait velocity slow   Gait Comments minAx2 able t+  WC pushed behind, pt weak today unable to incr distance   Exercises   Exercises Knee/Hip;Lumbar   Lumbar Exercises: Aerobic   UBE (Upper Arm Bike) sitting in WC level 0 x 6 min (3/3)    Lumbar Exercises: Seated   Other Seated Lumbar Exercises Rows 35# 3x 10, stting in WC   Other Seated Lumbar Exercises Flexion with 1# 2 x10   Knee/Hip Exercises: Seated   Marching Strengthening;2 sets;10 reps   Abduction/Adduction  Strengthening;2 sets;10 reps   Sit to Sand 3 sets;with UE support                  PT Short Term Goals - 08/16/15 1230    PT SHORT TERM GOAL #1   Title pt will be independent with intial HEP   Time 1   Period Weeks   Status Achieved           PT Long Term Goals - 08/28/15 1132    PT LONG TERM GOAL #1   Title  pt will perform sit to stand without assistance at his WC   Time 6   Period Weeks   Status On-going   PT LONG TERM GOAL #2   Title pt will ambulate x 55f with RW SBA   Time 6   Period Weeks   Status Partially Met   PT LONG TERM GOAL #3   Title pt will tolerate standing activities in the clinic x 175mutes to demonstrate improved endurance   Time 6   Period Weeks   Status On-going               Plan - 08/28/15 1128    Clinical Impression Statement We had two attempts of walking today, but distance was limited: 1st trial was 6 feet, 2nd & 3 rd trail after leg exercises was 105f. Pt with lumbar stenosis and deconditioning. Pt will continue to benefit from skilled PT   Pt will benefit from skilled therapeutic intervention in order to improve on the following deficits Abnormal gait;Decreased activity tolerance;Decreased endurance;Difficulty walking;Decreased strength   Rehab Potential Good   PT Frequency 2x / week   PT Duration 6 weeks   PT  Treatment/Interventions Gait training;Functional mobility training;Therapeutic exercise;Patient/family education;Passive range of motion   PT Next Visit Plan continue with ambulation, strength B UE/LE, try standing exercises continue UBE   Consulted and Agree with Plan of Care Patient        Problem List Patient Active Problem List   Diagnosis Date Noted  . Generalized weakness 04/13/2015  . Lobar pneumonia due to unspecified organism 04/13/2015  . Prolonged QT interval   . Weakness 04/11/2015  . CAP (community acquired pneumonia) 01/25/2015  . Dehydration 01/25/2015  . Chronic diastolic heart failure (HCCEidson Road8/08/2014  . Essential hypertension, benign 03/08/2014  . Hypothyroidism 03/08/2014  . Protein-calorie malnutrition, moderate (HCCGentry4/29/2015  . Constipation 03/08/2014  . Physical deconditioning 03/08/2014  . Hypokalemia 02/27/2014  . Abnormal serum protein electrophoresis 01/07/2013  . Chronic kidney disease (CKD), stage III (moderate) 01/03/2013  . Polymyalgia rheumatica (HCCGraceville2/24/2014  . Osteopenia 01/03/2013  . Carotid artery disease (HCCFleet[place]2/24/2014  . Spinal stenosis of lumbar region 01/03/2013  . Coronary artery disease 01/03/2013  . Old MI (myocardial infarction) 01/03/2013  . Unspecified transient cerebral ischemia 11/18/2011    NAUMANN-HOUEGNIFIO,Yusef Lamp PTA 08/28/2015, 12:56 PM  [facility] [address]one: [phone]:  [phone]: JacDAMACIO WEISGERBERN: [id number] of Birth: 8/104/13/1921

## 2015-08-30 ENCOUNTER — Ambulatory Visit (INDEPENDENT_AMBULATORY_CARE_PROVIDER_SITE_OTHER): Payer: Medicare Other | Admitting: Interventional Cardiology

## 2015-08-30 ENCOUNTER — Encounter: Payer: Self-pay | Admitting: Interventional Cardiology

## 2015-08-30 VITALS — BP 106/68 | HR 83 | Ht 71.0 in | Wt 186.1 lb

## 2015-08-30 DIAGNOSIS — N183 Chronic kidney disease, stage 3 unspecified: Secondary | ICD-10-CM

## 2015-08-30 DIAGNOSIS — I25810 Atherosclerosis of coronary artery bypass graft(s) without angina pectoris: Secondary | ICD-10-CM | POA: Diagnosis not present

## 2015-08-30 DIAGNOSIS — I5032 Chronic diastolic (congestive) heart failure: Secondary | ICD-10-CM | POA: Diagnosis not present

## 2015-08-30 DIAGNOSIS — I10 Essential (primary) hypertension: Secondary | ICD-10-CM

## 2015-08-30 NOTE — Patient Instructions (Signed)
Medication Instructions:  Your physician recommends that you continue on your current medications as directed. Please refer to the Current Medication list given to you today.   Labwork: None ordered  Testing/Procedures: None ordered  Follow-Up: Your physician wants you to follow-up in: 1 year or as needed You will receive a reminder letter in the mail two months in advance. If you don't receive a letter, please call our office to schedule the follow-up appointment.   Any Other Special Instructions Will Be Listed Below (If Applicable).

## 2015-08-30 NOTE — Progress Notes (Signed)
Cardiology Office Note   Date:  08/30/2015   ID:  Barry Wright, DOB 04/08/1920, MRN [id number]  PCP:  Barry Argyle, MD  Cardiologist:  Barry Grooms, MD   Chief Complaint  Patient presents with  . Coronary Artery Disease      History of Present Illness: Barry Wright is a 81 y.o. male who presents for prior coronary bypass surgery (remote), chronic diastolic heart failure, hypertension, hyperlipidemia, elderly and frail.  Patient is wheelchair-bound. For quite some time off spoken to he and his wife about ending chronic cardiology follow-up. They've been very stable now for multiple years. They want to come to be seen every year. No active evaluation or therapy has been required because they are asymptomatic.  Barry Wright remains stable. He has not had chest pain. No angina.   Past Medical History  Diagnosis Date  . Chronic back pain   . Hypercholesteremia   . Hypothyroidism   . CAD (coronary artery disease)   . Chronic kidney disease (CKD), stage III (moderate)   . Polymyalgia rheumatica (Barry Wright)   . Left bundle branch block   . Hx of CABG   . History of nephrectomy   . Internal carotid artery stenosis   . Myocardial infarction ([place])   . CHF (congestive heart failure) (Barry Wright)   . Stroke Barry Wright)     Past Surgical History  Procedure Laterality Date  . Coronary artery bypass graft    . Cholecystectomy    . Kidney surgery      kidney removed     Current Outpatient Prescriptions  Medication Sig Dispense Refill  . acetaminophen (TYLENOL) 500 MG tablet Take 1,000 mg by mouth 2 (two) times daily.     Marland Kitchen aspirin 81 MG tablet Take 81 mg by mouth daily.    . calcium carbonate (OS-CAL - DOSED IN MG OF ELEMENTAL CALCIUM) 1250 MG tablet Take 1 tablet by mouth daily with breakfast.    . clopidogrel (PLAVIX) 75 MG tablet Take 75 mg by mouth daily.      . folic acid (FOLVITE) 1 MG tablet Take 1 mg by mouth daily.    . furosemide (LASIX) 20 MG tablet Take 0.5  tablets (10 mg total) by mouth daily. 30 tablet 1  . gabapentin (NEURONTIN) 300 MG capsule Take 300 mg by mouth 3 (three) times daily.    Marland Kitchen levothyroxine (SYNTHROID, LEVOTHROID) 88 MCG tablet Take 88 mcg by mouth daily before breakfast.    . methotrexate (RHEUMATREX) 2.5 MG tablet Take 10 mg by mouth every Wednesday.    . Multiple Vitamins-Minerals (MULTIVITAMINS THER. W/MINERALS) TABS Take 1 tablet by mouth daily.      . nitroGLYCERIN (NITROSTAT) 0.4 MG SL tablet Place 0.4 mg under the tongue every 5 (five) minutes as needed for chest pain.    . polyethylene glycol (MIRALAX / GLYCOLAX) packet Take 17 g by mouth daily as needed for mild constipation.     . predniSONE (DELTASONE) 5 MG tablet Take 5 mg by mouth daily with breakfast.     . traMADol (ULTRAM) 50 MG tablet Take one tablet by mouth twice daily for pains (Patient taking differently: Take 50 mg by mouth 2 (two) times daily. ) 60 tablet 5   No current facility-administered medications for this visit.    Allergies:   Penicillins    Social History:  The patient  reports that he quit smoking about 44 years ago. His smoking use included Cigarettes. He has never used  smokeless tobacco. He reports that he does not drink alcohol or use illicit drugs.   Family History:  The patient's family history includes Heart disease in his father; Other in his mother.    ROS:  Please see the history of present illness.   Otherwise, review of systems are positive for back pain and difficulty with ambulation due to leg weakness..   All other systems are reviewed and negative.    PHYSICAL EXAM: VS:  BP 106/68 mmHg  Pulse 83  Ht 5\' 11"  (1.803 m)  Wt 84.423 kg (186 lb 1.9 oz)  BMI 25.97 kg/m2  SpO2 93% , BMI Body mass index is 25.97 kg/(m^2). GEN: Well nourished, well developed, in no acute distress. Elderly and frail. HEENT: normal Neck: no JVD, carotid bruits, or masses Cardiac: RRR.  There is no murmur, rub, or gallop. There is no  edema. Respiratory:  clear to auscultation bilaterally, normal work of breathing. GI: soft, nontender, nondistended, + BS MS: no deformity or atrophy Skin: warm and dry, no rash Neuro:  Strength and sensation are intact Psych: euthymic mood, full affect   EKG:  EKG is not ordered today.    Recent Labs: 04/11/2015: ALT 15* 04/12/2015: BUN 22*; Creatinine, Ser 1.14; Hemoglobin 12.4*; Platelets 219; Potassium 3.8; Sodium 141; TSH 3.200    Lipid Panel    Component Value Date/Time   CHOL 160 09/11/2011 0523   TRIG 60 09/11/2011 0523   HDL 61 09/11/2011 0523   CHOLHDL 2.6 09/11/2011 0523   VLDL 12 09/11/2011 0523   LDLCALC 87 09/11/2011 0523      Wt Readings from Last 3 Encounters:  08/30/15 84.423 kg (186 lb 1.9 oz)  04/13/15 86.084 kg (189 lb 12.5 oz)  01/30/15 79.4 kg (175 lb 0.7 oz)      Other studies Reviewed: Additional studies/ records that were reviewed today include: None. The findings include none.    ASSESSMENT AND PLAN:  1. Coronary artery disease involving coronary bypass graft of native heart without angina pectoris Asymptomatic  2. Essential hypertension, benign Controlled  3. Chronic diastolic heart failure (HCC) No evidence of volume overload  4. Chronic kidney disease (CKD), stage III (moderate) Not evaluated    Current medicines are reviewed at length with the patient today.  The patient has the following concerns regarding medicines: None.  The following changes/actions have been instituted:    Reassurance  Discussed doing away with chronic cardiology follow-up. The patient and the husband disavowed that idea and wanted to continue to, on a yearly basis even though there is no active therapy.  Labs/ tests ordered today include:  No orders of the defined types were placed in this encounter.     Disposition:   FU with HS in 1 years  Signed, Barry Grooms, MD  08/30/2015 12:23 PM    [place], [place],   [address] Phone: [phone]; Fax: [phone]

## 2015-08-31 ENCOUNTER — Ambulatory Visit: Payer: Medicare Other | Admitting: Physical Therapy

## 2015-08-31 ENCOUNTER — Encounter: Payer: Self-pay | Admitting: Physical Therapy

## 2015-08-31 DIAGNOSIS — R269 Unspecified abnormalities of gait and mobility: Secondary | ICD-10-CM

## 2015-08-31 DIAGNOSIS — R262 Difficulty in walking, not elsewhere classified: Secondary | ICD-10-CM | POA: Diagnosis not present

## 2015-08-31 NOTE — Therapy (Signed)
[facility] [address] [place], [place], [address] Phone: [phone]   Fax:  [phone]  Physical Therapy Treatment  Patient Details  Name: Barry Wright MRN: [id number] Date of Birth: 09-21-1920 No Data Recorded  Encounter Date: 08/31/2015      PT End of Session - 08/31/15 1153    Visit Number 8   Number of Visits 10   Date for PT Re-Evaluation 09/10/15   PT Start Time 1102   PT Stop Time 1146   PT Time Calculation (min) 44 min   Equipment Utilized During Treatment Gait belt;Other (comment)   Activity Tolerance Patient tolerated treatment well   Behavior During Therapy [facility] for tasks assessed/performed      Past Medical History  Diagnosis Date  . Chronic back pain   . Hypercholesteremia   . Hypothyroidism   . CAD (coronary artery disease)   . Chronic kidney disease (CKD), stage III (moderate)   . Polymyalgia rheumatica (HCC)   . Left bundle branch block   . Hx of CABG   . History of nephrectomy   . Internal carotid artery stenosis   . Myocardial infarction (HCC)   . CHF (congestive heart failure) (HCC)   . Stroke [facility])     Past Surgical History  Procedure Laterality Date  . Coronary artery bypass graft    . Cholecystectomy    . Kidney surgery      kidney removed    There were no vitals filed for this visit.  Visit Diagnosis:  Abnormality of gait  Difficulty walking      Subjective Assessment - 08/31/15 1116    Subjective I feel pretty good   Patient is accompained by: Family member   Currently in Pain? No/denies                         [facility] Adult PT Treatment/Exercise - 08/31/15 0001    Ambulation/Gait   Ambulation Distance (Feet) 27 Feet  20   Assistive device Rolling walker   Gait Pattern Step-to pattern   Ambulation Surface Level   Gait velocity slow   Gait Comments minA [place] caregiver behind with WC   Lumbar Exercises: Aerobic   UBE (Upper Arm Bike)  sitting in WC level 1 x 6 min (3/3)    Lumbar Exercises: Seated   Other Seated Lumbar Exercises lat pull on pully station 45# 3x10  while sitting in W/C    Knee/Hip Exercises: Seated   Long Arc Quad Strengthening;AROM;20 reps  needs reminders to increase ROM   Careers information officer;Other (comment)  x 1 min 1# added each leg   Abduction/Adduction  Strengthening;2 sets;10 reps  ADD with blue ball   Sit to Sand 3 sets                  PT Short Term Goals - 08/16/15 1230    PT SHORT TERM GOAL #1   Title pt will be independent with intial HEP   Time 1   Period Weeks   Status Achieved           PT Long Term Goals - 08/28/15 1132    PT LONG TERM GOAL #1   Title pt will perform sit to stand without assistance at his WC   Time 6   Period Weeks   Status On-going   PT LONG TERM GOAL #2   Title pt will ambulate x 65ft with RW SBA  Time 6   Period Weeks   Status Partially Met   PT LONG TERM GOAL #3   Title pt will tolerate standing activities in the clinic x 52mnutes to demonstrate improved endurance   Time 6   Period Weeks   Status On-going               Plan - 08/31/15 1154    Clinical Impression Statement Pt with good performance with walking today, able to complete 253ft and 20. Pt will continue to benefit from skilled PT.   Pt will benefit from skilled therapeutic intervention in order to improve on the following deficits Abnormal gait;Decreased activity tolerance;Decreased endurance;Difficulty walking;Decreased strength   Rehab Potential Good   Clinical Impairments Affecting Rehab Potential Pt with lumbar stenosis and deconditioning.    PT Frequency 2x / week   PT Duration 6 weeks   PT Treatment/Interventions Gait training;Functional mobility training;Therapeutic exercise;Patient/family education;Passive range of motion   PT Next Visit Plan continue with ambulation, strength B UE/LE, try standing exercises continue UBE   Consulted and Agree with  Plan of Care Patient        Problem List Patient Active Problem List   Diagnosis Date Noted  . Generalized weakness 04/13/2015  . Lobar pneumonia due to unspecified organism 04/13/2015  . Prolonged QT interval   . Weakness 04/11/2015  . CAP (community acquired pneumonia) 01/25/2015  . Dehydration 01/25/2015  . Chronic diastolic heart failure (HCLuce08/08/2014  . Essential hypertension, benign 03/08/2014  . Hypothyroidism 03/08/2014  . Protein-calorie malnutrition, moderate (HCHarrodsburg04/29/2015  . Constipation 03/08/2014  . Physical deconditioning 03/08/2014  . Hypokalemia 02/27/2014  . Abnormal serum protein electrophoresis 01/07/2013  . Chronic kidney disease (CKD), stage III (moderate) 01/03/2013  . Polymyalgia rheumatica (HCEureka Mill02/24/2014  . Osteopenia 01/03/2013  . Carotid artery disease (HCMontello02/24/2014  . Spinal stenosis of lumbar region 01/03/2013  . Coronary artery disease 01/03/2013  . Old MI (myocardial infarction) 01/03/2013  . Unspecified transient cerebral ischemia 11/18/2011    NAUMANN-HOUEGNIFIO,Mikhala Kenan PTA 08/31/2015, 12:01 PM  [facility] [address]one: [phone] Fax:  [phone]: JaGARVEY WESTCOTTRN: [id number] of Birth: [birth year]

## 2015-09-04 ENCOUNTER — Ambulatory Visit: Payer: Medicare Other | Admitting: Physical Therapy

## 2015-09-04 DIAGNOSIS — R262 Difficulty in walking, not elsewhere classified: Secondary | ICD-10-CM

## 2015-09-04 DIAGNOSIS — R269 Unspecified abnormalities of gait and mobility: Secondary | ICD-10-CM

## 2015-09-04 NOTE — Therapy (Signed)
[facility] [address], [place], [place], [address] Phone: [phone]   Fax:  [phone]  Physical Therapy Treatment  Patient Details  Name: Barry Wright MRN: [id number] Date of Birth: January 19, 1920 Referring Provider: Felipa Eth  Encounter Date: 09/04/2015      PT End of Session - 09/04/15 1152    Visit Number 9   Number of Visits 10   Date for PT Re-Evaluation 09/10/15   PT Start Time 1101   PT Stop Time 1146   PT Time Calculation (min) 45 min   Equipment Utilized During Treatment Gait belt;Other (comment)   Activity Tolerance Patient tolerated treatment well   Behavior During Therapy [facility] for tasks assessed/performed      Past Medical History  Diagnosis Date  . Chronic back pain   . Hypercholesteremia   . Hypothyroidism   . CAD (coronary artery disease)   . Chronic kidney disease (CKD), stage III (moderate)   . Polymyalgia rheumatica ([place])   . Left bundle branch block   . Hx of CABG   . History of nephrectomy   . Internal carotid artery stenosis   . Myocardial infarction ([place])   . CHF (congestive heart failure) ([place])   . Stroke [facility])     Past Surgical History  Procedure Laterality Date  . Coronary artery bypass graft    . Cholecystectomy    . Kidney surgery      kidney removed    There were no vitals filed for this visit.  Visit Diagnosis:  Abnormality of gait  Difficulty walking          [facility] PT Assessment - 09/04/15 0001    Assessment   Referring Provider [place]   Observation/Other Assessments   Focus on Therapeutic Outcomes (FOTO)  84%   Ambulation/Gait   Assistive device Rolling walker   Gait Pattern Step-to pattern   Ambulation Surface Level   Gait velocity slow   Gait Comments minA [place], caregiver behind with WC                     [facility] Adult PT Treatment/Exercise - 09/04/15 0001    Bed Mobility   Bed Mobility --  Pt wishes to be called Barry Wright   Ambulation/Gait   Ambulation Distance (Feet) 27 Feet  23 feet, pt needs constand reminders for knee extension   Exercises   Exercises Knee/Hip;Lumbar   Lumbar Exercises: Aerobic   UBE (Upper Arm Bike) sitting in WC level 1 x 6 min (3/3)    Lumbar Exercises: Seated   Other Seated Lumbar Exercises lat pull on pully station 45# 3x10   Knee/Hip Exercises: Standing   Forward Step Up Right;10 reps;Hand Hold: 2;Step Height: 6"  tapping with Rt LE, unable to perform with Lt LE   Knee/Hip Exercises: Seated   Long Arc Quad Strengthening;AROM;20 reps  add weights, needs reminders to incr extension   Marching Strengthening;2 sets;10 reps  add weight next visit   Sit to [employer] 3 sets                  PT Short Term Goals - 08/16/15 1230    PT SHORT TERM GOAL #1   Title pt will be independent with intial HEP   Time 1   Period Weeks   Status Achieved           PT Long Term Goals - 09/04/15 1155    PT LONG TERM GOAL #1  Title pt will perform sit to stand without assistance at his WC   Time 6   Period Weeks   Status On-going   PT LONG TERM GOAL #2   Title pt will ambulate x 45ft with RW SBA   Time 6   Period Weeks   Status Achieved   PT LONG TERM GOAL #3   Title pt will tolerate standing activities in the clinic x 64minutes to demonstrate improved endurance   Time 6   Period Weeks   Status On-going               Plan - 09/04/15 1153    Clinical Impression Statement Pt able to walk twice today with RW and W/C pushed behing:  27feet and 104feet    Pt will benefit from skilled therapeutic intervention in order to improve on the following deficits Abnormal gait;Decreased activity tolerance;Decreased endurance;Difficulty walking;Decreased strength   Rehab Potential Good   PT Frequency 2x / week   PT Duration 6 weeks   PT Treatment/Interventions Gait training;Functional mobility training;Therapeutic exercise;Patient/family education;Passive range of motion   PT Next  Visit Plan G-Codes next visit. Continue with ambulation, strength B LE/UE, may try standing exercises, continue UBE   Consulted and Agree with Plan of Care Patient        Problem List Patient Active Problem List   Diagnosis Date Noted  . Generalized weakness 04/13/2015  . Lobar pneumonia due to unspecified organism 04/13/2015  . Prolonged QT interval   . Weakness 04/11/2015  . CAP (community acquired pneumonia) 01/25/2015  . Dehydration 01/25/2015  . Chronic diastolic heart failure ([place]) 06/19/2014  . Essential hypertension, benign 03/08/2014  . Hypothyroidism 03/08/2014  . Protein-calorie malnutrition, moderate ([place]) 03/08/2014  . Constipation 03/08/2014  . Physical deconditioning 03/08/2014  . Hypokalemia 02/27/2014  . Abnormal serum protein electrophoresis 01/07/2013  . Chronic kidney disease (CKD), stage III (moderate) 01/03/2013  . Polymyalgia rheumatica ([place]) 01/03/2013  . Osteopenia 01/03/2013  . Carotid artery disease ([place]) 01/03/2013  . Spinal stenosis of lumbar region 01/03/2013  . Coronary artery disease 01/03/2013  . Old MI (myocardial infarction) 01/03/2013  . Unspecified transient cerebral ischemia 11/18/2011    NAUMANN-HOUEGNIFIO,Sung Parodi PTA 09/04/2015, 12:03 PM  [facility] [address] [place], [place], [address] Phone: [phone]   Fax:  [phone]  Name: Barry Wright MRN: [id number] Date of Birth: 1919-12-17

## 2015-09-06 ENCOUNTER — Ambulatory Visit: Payer: Medicare Other | Admitting: Physical Therapy

## 2015-09-06 ENCOUNTER — Encounter: Payer: Self-pay | Admitting: Physical Therapy

## 2015-09-06 DIAGNOSIS — R262 Difficulty in walking, not elsewhere classified: Secondary | ICD-10-CM | POA: Diagnosis not present

## 2015-09-06 DIAGNOSIS — R269 Unspecified abnormalities of gait and mobility: Secondary | ICD-10-CM

## 2015-09-06 DIAGNOSIS — Z79899 Other long term (current) drug therapy: Secondary | ICD-10-CM | POA: Diagnosis not present

## 2015-09-06 NOTE — Therapy (Signed)
[facility] [address], [place], [place], [address] Phone: [phone]   Fax:  [phone]  Physical Therapy Treatment  Patient Details  Name: Barry Wright MRN: [id number] Date of Birth: 03-Sep-1920 Referring Provider: Dr. Lajean Manes  Encounter Date: 09/06/2015      PT End of Session - 09/06/15 1130    Visit Number 10   Number of Visits 20  Medicare   Date for PT Re-Evaluation 11/05/15   PT Start Time 1100   PT Stop Time 1138   PT Time Calculation (min) 38 min   Equipment Utilized During Treatment Gait belt;Other (comment)   Activity Tolerance Patient tolerated treatment well   Behavior During Therapy [name] for tasks assessed/performed      Past Medical History  Diagnosis Date  . Chronic back pain   . Hypercholesteremia   . Hypothyroidism   . CAD (coronary artery disease)   . Chronic kidney disease (CKD), stage III (moderate)   . Polymyalgia rheumatica ([place])   . Left bundle branch block   . Hx of CABG   . History of nephrectomy   . Internal carotid artery stenosis   . Myocardial infarction ([place])   . CHF (congestive heart failure) ([place])   . Stroke [facility])     Past Surgical History  Procedure Laterality Date  . Coronary artery bypass graft    . Cholecystectomy    . Kidney surgery      kidney removed    There were no vitals filed for this visit.  Visit Diagnosis:  Abnormality of gait - Plan: PT plan of care cert/re-cert  Difficulty walking - Plan: PT plan of care cert/re-cert      Subjective Assessment - 09/06/15 1059    Subjective I can walk better and a little further, stand better, getting in and out of chair to sleep.    Patient is accompained by: --  caregiver   Limitations Walking;Standing   Diagnostic tests reports no other real health issues.     Patient Stated Goals would like to be able to walk into church   Currently in Pain? No/denies            [facility] PT Assessment -  09/06/15 0001    Assessment   Medical Diagnosis lumbar stenosis   Referring Provider Dr. Lajean Manes   Onset Date/Surgical Date 01/09/15   Precautions   Precautions Fall   Balance Screen   Has the patient fallen in the past 6 months No   Has the patient had a decrease in activity level because of a fear of falling?  No   Is the patient reluctant to leave their home because of a fear of falling?  No   Prior Function   Level of Independence Needs assistance with ADLs   Vocation Retired   Comments stays in Wheelchair when outside of house   Cognition   Overall Cognitive Status Within Functional Limits for tasks assessed   Observation/Other Assessments   Focus on Therapeutic Outcomes (FOTO)  84%   Strength   Right Hip Flexion 3+/5   Left Hip Flexion 4-/5                     [facility] Adult PT Treatment/Exercise - 09/06/15 0001    Ambulation/Gait   Ambulation Distance (Feet) 12 Feet  2x; w/c behind him, minA   Assistive device Rolling walker   Gait Pattern Step-to pattern   Ambulation Surface Level  Gait velocity slow   Gait Comments minA Adazia, caregiver behind with WC   Knee/Hip Exercises: Seated   Long Arc Quad Strengthening;Right;Left;10 reps;2 sets;Weights   Long Arc Quad Weight 1 lbs.                PT Education - 30-Sep-2015 1129    Education provided No          PT Short Term Goals - 09-30-15 1102    PT SHORT TERM GOAL #1   Title pt will be independent with intial HEP   Time 1   Period Weeks   Status Achieved           PT Long Term Goals - 2015-09-30 1103    PT LONG TERM GOAL #1   Title pt will perform sit to stand without assistance at his WC   Time 6   Period Weeks   Status On-going   PT LONG TERM GOAL #2   Title pt will ambulate x 67f with RW SBA   Time 6   Period Weeks   Status Achieved   PT LONG TERM GOAL #3   Title pt will tolerate standing activities in the clinic x 147mutes to demonstrate improved endurance   Time 6    Period Weeks   Status On-going   PT LONG TERM GOAL #4   Title patient able to walk 30 feet with rolling walker and c.g with aide from W/C   Time 8   Period Weeks   Status New               Plan - 10November 20, [id number]    Clinical Impression Statement Patient is a [age]ear old male with diagnosis of gait abnormality.  Patient has met his STG.  Patient has met LTG #2.  Has not LTG'1 due to needing assistance to stand up in W/C to walker. Patient has not met LTG # 3 due to decreased standing tolerance and neeeds assistance. Right hip flexion strength is 3+/5 and left is 4-/5. Patient is now using a weight with his leg exercises. Patient is able to walk further with min assistance. Patient wil lbenefit from physical therapy to increase strength, improve standing tolerance, and improve gait.    Pt will benefit from skilled therapeutic intervention in order to improve on the following deficits Abnormal gait;Decreased activity tolerance;Decreased endurance;Difficulty walking;Decreased strength   Rehab Potential Good   Clinical Impairments Affecting Rehab Potential Pt with lumbar stenosis and deconditioning.    PT Frequency 2x / week   PT Duration 8 weeks   PT Treatment/Interventions Gait training;Functional mobility training;Therapeutic exercise;Patient/family education;Passive range of motion   PT Next Visit Plan Continue with ambulation, strength B LE/UE, may try standing exercises, continue UBE   PT Home Exercise Plan progress as needed   Recommended Other Services None   Consulted and Agree with Plan of Care Patient          G-Codes - 10November 20, 2016129    Functional Assessment Tool Used FOTO score is 85% limitaiton   Functional Limitation Mobility: Walking and moving around   Mobility: Walking and Moving Around Current Status ([phone] least 80 percent but less than 100 percent impaired, limited or restricted   Mobility: Walking and Moving Around Goal Status ([phone] least 1 percent but  less than 20 percent impaired, limited or restricted      Problem List Patient Active Problem List   Diagnosis Date Noted  . Generalized weakness 04/13/2015  .  Lobar pneumonia due to unspecified organism 04/13/2015  . Prolonged QT interval   . Weakness 04/11/2015  . CAP (community acquired pneumonia) 01/25/2015  . Dehydration 01/25/2015  . Chronic diastolic heart failure ([place]) 06/19/2014  . Essential hypertension, benign 03/08/2014  . Hypothyroidism 03/08/2014  . Protein-calorie malnutrition, moderate ([place]) 03/08/2014  . Constipation 03/08/2014  . Physical deconditioning 03/08/2014  . Hypokalemia 02/27/2014  . Abnormal serum protein electrophoresis 01/07/2013  . Chronic kidney disease (CKD), stage III (moderate) 01/03/2013  . Polymyalgia rheumatica ([place]) 01/03/2013  . Osteopenia 01/03/2013  . Carotid artery disease ([place]) 01/03/2013  . Spinal stenosis of lumbar region 01/03/2013  . Coronary artery disease 01/03/2013  . Old MI (myocardial infarction) 01/03/2013  . Unspecified transient cerebral ischemia 11/18/2011    Geniya Fulgham,PT 09/06/2015, 11:34 AM  [facility] [address] [place], [place], [address] Phone: [phone]   Fax:  [phone]  Name: Barry Wright MRN: [id number] Date of Birth: Dec 16, 1919

## 2015-09-10 ENCOUNTER — Ambulatory Visit: Payer: Medicare Other | Admitting: Physical Therapy

## 2015-09-10 ENCOUNTER — Encounter: Payer: Self-pay | Admitting: Physical Therapy

## 2015-09-10 DIAGNOSIS — R262 Difficulty in walking, not elsewhere classified: Secondary | ICD-10-CM | POA: Diagnosis not present

## 2015-09-10 DIAGNOSIS — R269 Unspecified abnormalities of gait and mobility: Secondary | ICD-10-CM | POA: Diagnosis not present

## 2015-09-10 NOTE — Therapy (Signed)
[facility] [address], [place], [place], [address] Phone: [phone]   Fax:  [phone]  Physical Therapy Treatment  Patient Details  Name: Barry Wright MRN: [id number] Date of Birth: 1920/03/14 Referring Provider: Dr. Lajean Wright  Encounter Date: 09/10/2015      PT End of Session - 09/10/15 1036    Visit Number 11   Number of Visits 20   Date for PT Re-Evaluation 11/05/15   PT Start Time 2119   PT Stop Time 1100   PT Time Calculation (min) 45 min   Equipment Utilized During Treatment Gait belt;Other (comment)   Activity Tolerance Patient tolerated treatment well   Behavior During Therapy [facility] for tasks assessed/performed      Past Medical History  Diagnosis Date  . Chronic back pain   . Hypercholesteremia   . Hypothyroidism   . CAD (coronary artery disease)   . Chronic kidney disease (CKD), stage III (moderate)   . Polymyalgia rheumatica ([place])   . Left bundle branch block   . Hx of CABG   . History of nephrectomy   . Internal carotid artery stenosis   . Myocardial infarction ([place])   . CHF (congestive heart failure) ([place])   . Stroke [facility])     Past Surgical History  Procedure Laterality Date  . Coronary artery bypass graft    . Cholecystectomy    . Kidney surgery      kidney removed    There were no vitals filed for this visit.  Visit Diagnosis:  Abnormality of gait  Difficulty walking                       [facility] Adult PT Treatment/Exercise - 09/10/15 0001    Bed Mobility   Bed Mobility --  pt wishes to be called Barry Wright   Ambulation/Gait   Ambulation Distance (Feet) 10 Feet   Assistive device Rolling walker   Gait Pattern Step-to pattern   Ambulation Surface Level   Gait velocity slow   Gait Comments minA Barry Wright, caregiver behind with [facility]   Exercises   Exercises Knee/Hip;Lumbar   Lumbar Exercises: Aerobic   UBE (Upper Arm Bike) sitting in WC level 1 x 6 min  (3/3)    Lumbar Exercises: Standing   Other Standing Lumbar Exercises holding at rail of TM marching in place x 2 short duration    Lumbar Exercises: Seated   Other Seated Lumbar Exercises Rows 35# 3x 10, stting in WC  hamstring curls Lt LE 30#, 25# R LE x 30 each   Other Seated Lumbar Exercises lat pull on pully station 45# 3x10   Knee/Hip Exercises: Seated   Long Arc Quad Strengthening;Right;Left;10 reps;2 sets;Weights   Long Arc Quad Weight 1 lbs.   Marching Strengthening;2 sets;10 reps;Weights   Marching Limitations 1   Sit to [employer] 3 sets                  PT Short Term Goals - 09/10/15 1043    PT SHORT TERM GOAL #1   Title pt will be independent with intial HEP   Time 1   Period Weeks   Status Achieved           PT Long Term Goals - 09/10/15 1043    PT LONG TERM GOAL #1   Title pt will perform sit to stand without assistance at his WC   Time 6   Period Weeks  Status On-going   PT LONG TERM GOAL #2   Title pt will ambulate x 41ft with RW SBA   Time 6   Period Weeks   Status Achieved   PT LONG TERM GOAL #3   Title pt will tolerate standing activities in the clinic x 67minutes to demonstrate improved endurance   Time 6   Period Weeks   Status On-going   PT LONG TERM GOAL #4   Title patient able to walk 30 feet with rolling walker and c.g with aide from W/C   Time 8   Period Weeks   Status On-going               Plan - 09/10/15 1036    Clinical Impression Statement Patient is a 81 y. o. male with diagnosis of gait abnormality. Walking distance was limited to 12 feet today. Pt able to toerate the weight exercises at the gym well. Pt will continue to benefit from skilled PT to increase strength, improve standing tolerance, and improve gait.   Pt will benefit from skilled therapeutic intervention in order to improve on the following deficits Abnormal gait;Decreased activity tolerance;Decreased endurance;Difficulty walking;Decreased strength    Rehab Potential Good   Clinical Impairments Affecting Rehab Potential Pt with lumbar stenosis and deconditioning.    PT Frequency 2x / week   PT Duration 8 weeks   PT Treatment/Interventions Gait training;Functional mobility training;Therapeutic exercise;Patient/family education;Passive range of motion   PT Next Visit Plan Continue with ambulation, strength B LE/UE, may try standing exercises, continue UBE   PT Home Exercise Plan progress as needed   Consulted and Agree with Plan of Care Patient        Problem List Patient Active Problem List   Diagnosis Date Noted  . Generalized weakness 04/13/2015  . Lobar pneumonia due to unspecified organism 04/13/2015  . Prolonged QT interval   . Weakness 04/11/2015  . CAP (community acquired pneumonia) 01/25/2015  . Dehydration 01/25/2015  . Chronic diastolic heart failure ([place]) 06/19/2014  . Essential hypertension, benign 03/08/2014  . Hypothyroidism 03/08/2014  . Protein-calorie malnutrition, moderate ([place]) 03/08/2014  . Constipation 03/08/2014  . Physical deconditioning 03/08/2014  . Hypokalemia 02/27/2014  . Abnormal serum protein electrophoresis 01/07/2013  . Chronic kidney disease (CKD), stage III (moderate) 01/03/2013  . Polymyalgia rheumatica ([place]) 01/03/2013  . Osteopenia 01/03/2013  . Carotid artery disease ([place]) 01/03/2013  . Spinal stenosis of lumbar region 01/03/2013  . Coronary artery disease 01/03/2013  . Old MI (myocardial infarction) 01/03/2013  . Unspecified transient cerebral ischemia 11/18/2011    Barry Wright PTA 09/10/2015, 10:59 AM  [facility] [address], [place], [place], [address] Phone: [phone]   Fax:  [phone]  Name: Barry Wright MRN: [id number] Date of Birth: Apr 18, 1920

## 2015-09-12 ENCOUNTER — Ambulatory Visit: Payer: Medicare Other | Attending: Geriatric Medicine | Admitting: Physical Therapy

## 2015-09-12 DIAGNOSIS — R262 Difficulty in walking, not elsewhere classified: Secondary | ICD-10-CM | POA: Insufficient documentation

## 2015-09-12 DIAGNOSIS — R269 Unspecified abnormalities of gait and mobility: Secondary | ICD-10-CM | POA: Insufficient documentation

## 2015-09-12 NOTE — Therapy (Signed)
[facility] [address] [place], [place], [address] Phone: [phone]   Fax:  [phone]  Physical Therapy Treatment  Patient Details  Name: Barry Wright MRN: [id number] Date of Birth: [birth year] Referring Provider: Dr. Lajean Manes  Encounter Date: 09/12/2015      PT End of Session - 09/12/15 1212    Visit Number 12   Number of Visits 20   Date for PT Re-Evaluation 11/05/15   PT Start Time 1146   PT Stop Time 1230   PT Time Calculation (min) 44 min   Equipment Utilized During Treatment Gait belt;Other (comment)   Activity Tolerance Patient tolerated treatment well   Behavior During Therapy [facility] for tasks assessed/performed      Past Medical History  Diagnosis Date  . Chronic back pain   . Hypercholesteremia   . Hypothyroidism   . CAD (coronary artery disease)   . Chronic kidney disease (CKD), stage III (moderate)   . Polymyalgia rheumatica ([place])   . Left bundle branch block   . Hx of CABG   . History of nephrectomy   . Internal carotid artery stenosis   . Myocardial infarction ([place])   . CHF (congestive heart failure) ([place])   . Stroke [facility])     Past Surgical History  Procedure Laterality Date  . Coronary artery bypass graft    . Cholecystectomy    . Kidney surgery      kidney removed    There were no vitals filed for this visit.  Visit Diagnosis:  Abnormality of gait  Difficulty walking      Subjective Assessment - 09/12/15 1155    Subjective Pt reports compliance with HEP.    Limitations Walking;Standing   Diagnostic tests reports no other real health issues.     Patient Stated Goals would like to be able to walk into church   Currently in Pain? No/denies                         [facility] Adult PT Treatment/Exercise - 09/12/15 0001    Bed Mobility   Bed Mobility --  Pt wishes to be called Barry Wright   Ambulation/Gait   Ambulation Distance (Feet) 14 Feet  second trail  24feet, WC pushed behind    Assistive device Rolling walker   Gait Pattern Step-to pattern   Ambulation Surface Level   Gait velocity slow   Gait Comments minA Adazia, caregiver behind with [facility]   Exercises   Exercises Knee/Hip;Lumbar   Lumbar Exercises: Aerobic   UBE (Upper Arm Bike) sitting in WC level 1 x 6 min (3/3)    Lumbar Exercises: Standing   Other Standing Lumbar Exercises holding at rail of TM marching in place x 2 35 sec, & standing 45mination    Lumbar Exercises: Seated   Other Seated Lumbar Exercises Rows 35# 3x 10, stting in WC   Other Seated Lumbar Exercises lat pull on pully station 45# 3x10   Knee/Hip Exercises: Seated   Long Arc Quad --   Long Arc [employer] --   Sit to [employer] 3 sets                  PT Short Term Goals - 09/10/15 1043    PT SHORT TERM GOAL #1   Title pt will be independent with intial HEP   Time 1   Period Weeks   Status Achieved  PT Long Term Goals - 09/10/15 1043    PT LONG TERM GOAL #1   Title pt will perform sit to stand without assistance at his WC   Time 6   Period Weeks   Status On-going   PT LONG TERM GOAL #2   Title pt will ambulate x 71ft with RW SBA   Time 6   Period Weeks   Status Achieved   PT LONG TERM GOAL #3   Title pt will tolerate standing activities in the clinic x 29minutes to demonstrate improved endurance   Time 6   Period Weeks   Status On-going   PT LONG TERM GOAL #4   Title patient able to walk 30 feet with rolling walker and c.g with aide from W/C   Time 8   Period Weeks   Status On-going               Plan - 09/12/15 1215    Clinical Impression Statement Pt is a 81 y.o. male with diagnosis of gait abnormality. Wlking distance improved to 14 feet today. Pt will continue to benefit from skilled Pt to increase strength, improve standing tolerance, and improve gait.   Pt will benefit from skilled therapeutic intervention in order to improve on the following deficits Abnormal  gait;Decreased activity tolerance;Decreased endurance;Difficulty walking;Decreased strength   Rehab Potential Good   Clinical Impairments Affecting Rehab Potential Pt with lumbar stenosis and deconditioning.    PT Frequency 2x / week   PT Duration 8 weeks   PT Treatment/Interventions Gait training;Functional mobility training;Therapeutic exercise;Patient/family education;Passive range of motion   PT Next Visit Plan Continue with ambulation, strength B LE/UE, may try standing exercises, continue UBE   PT Home Exercise Plan progress as needed   Consulted and Agree with Plan of Care Patient        Problem List Patient Active Problem List   Diagnosis Date Noted  . Generalized weakness 04/13/2015  . Lobar pneumonia due to unspecified organism 04/13/2015  . Prolonged QT interval   . Weakness 04/11/2015  . CAP (community acquired pneumonia) 01/25/2015  . Dehydration 01/25/2015  . Chronic diastolic heart failure ([place]) 06/19/2014  . Essential hypertension, benign 03/08/2014  . Hypothyroidism 03/08/2014  . Protein-calorie malnutrition, moderate ([place]) 03/08/2014  . Constipation 03/08/2014  . Physical deconditioning 03/08/2014  . Hypokalemia 02/27/2014  . Abnormal serum protein electrophoresis 01/07/2013  . Chronic kidney disease (CKD), stage III (moderate) 01/03/2013  . Polymyalgia rheumatica ([place]) 01/03/2013  . Osteopenia 01/03/2013  . Carotid artery disease ([place]) 01/03/2013  . Spinal stenosis of lumbar region 01/03/2013  . Coronary artery disease 01/03/2013  . Old MI (myocardial infarction) 01/03/2013  . Unspecified transient cerebral ischemia 11/18/2011    NAUMANN-HOUEGNIFIO,Barry Wright PTA 09/12/2015, 12:30 PM  [facility] [address] [place], [place], [address] Phone: [phone]   Fax:  [phone]  Name: Barry Wright MRN: [id number] Date of Birth: [birth year]

## 2015-09-17 ENCOUNTER — Encounter: Payer: Self-pay | Admitting: Physical Therapy

## 2015-09-17 ENCOUNTER — Ambulatory Visit: Payer: Medicare Other | Admitting: Physical Therapy

## 2015-09-17 DIAGNOSIS — R269 Unspecified abnormalities of gait and mobility: Secondary | ICD-10-CM

## 2015-09-17 DIAGNOSIS — R262 Difficulty in walking, not elsewhere classified: Secondary | ICD-10-CM | POA: Diagnosis not present

## 2015-09-17 NOTE — Therapy (Addendum)
[facility] [address] [place], [place], [address] Phone: [phone]   Fax:  [phone]  Physical Therapy Treatment  Patient Details  Name: COBEN GODSHALL MRN: [id number] Date of Birth: 22-Aug-1920 Referring Provider: Dr. Lajean Manes  Encounter Date: 09/17/2015      PT End of Session - 09/17/15 1105    Visit Number 13   Number of Visits 20  Medicare   Date for PT Re-Evaluation 11/05/15   PT Start Time 1100   PT Stop Time 1140   PT Time Calculation (min) 40 min   Equipment Utilized During Treatment Gait belt;Other (comment)   Activity Tolerance Patient tolerated treatment well   Behavior During Therapy [facility] for tasks assessed/performed      Past Medical History  Diagnosis Date  . Chronic back pain   . Hypercholesteremia   . Hypothyroidism   . CAD (coronary artery disease)   . Chronic kidney disease (CKD), stage III (moderate)   . Polymyalgia rheumatica ([place])   . Left bundle branch block   . Hx of CABG   . History of nephrectomy   . Internal carotid artery stenosis   . Myocardial infarction ([place])   . CHF (congestive heart failure) ([place])   . Stroke [facility])     Past Surgical History  Procedure Laterality Date  . Coronary artery bypass graft    . Cholecystectomy    . Kidney surgery      kidney removed    There were no vitals filed for this visit.  Visit Diagnosis:  Abnormality of gait  Difficulty walking      Subjective Assessment - 09/17/15 1106    Subjective Everything is getting easier. today I had trouble working with my aide.    Limitations Walking;Standing   Diagnostic tests reports no other real health issues.     Patient Stated Goals would like to be able to walk into church   Currently in Pain? No/denies      G-code functional assessment tool used is FOTO score 85% limitation.  Functional limitation Mobility:Walking and moving around.  Goal status is CI.  Discharge status is  CM. Earlie Counts, PT 09/19/2015 12:16 PM                     [facility] Adult PT Treatment/Exercise - 09/17/15 0001    Ambulation/Gait   Ambulation Distance (Feet) 1 Feet  1 time 1 step   Assistive device Rolling walker   Gait Comments Patient attempted 4 times to stand and one time was able to take 1 step with gait belt and mod. assist   Lumbar Exercises: Aerobic   UBE (Upper Arm Bike) sitting in WC level 1 x 6 min (3/3)    Lumbar Exercises: Seated   Other Seated Lumbar Exercises Rows 35# 3x 10, stting in WC   Other Seated Lumbar Exercises lat pull on pully station 45# 3x10   Knee/Hip Exercises: Seated   Long Arc Quad Strengthening;Right;Left;3 sets;10 reps  verbal cues to straighten knees   Marching Strengthening;Right;Left;15 reps;2 sets                PT Education - 09/17/15 1140    Education provided No          PT Short Term Goals - 09/17/15 1130    PT SHORT TERM GOAL #1   Title pt will be independent with intial HEP   Time 1   Period Weeks   Status  Achieved           PT Long Term Goals - 09/17/15 1130    PT LONG TERM GOAL #1   Title pt will perform sit to stand without assistance at his WC   Time 6   Period Weeks   Status On-going  min assist   PT LONG TERM GOAL #2   Title pt will ambulate x 53f with RW SBA   Time 6   Period Weeks   Status Achieved  unable to do today but able to in the past   PT LONG TERM GOAL #3   Title pt will tolerate standing activities in the clinic x 113mutes to demonstrate improved endurance   Time 6   Period Weeks   Status On-going  not able to do today   PT LONG TERM GOAL #4   Title patient able to walk 30 feet with rolling walker and c.g with aide from W/C   Time 8   Period Weeks   Status On-going               Plan - 09/17/15 1131    Clinical Impression Statement Patient is a [age]ear old male with diagnosis of gait abnormality.  Today patient was only able to stand for 1 time. Patient  is having difficuty with placing weight into his legs today.  Patient had no difficulty with arm exercises.  Paient has not met goals today due to lower extremity weakness. patient would benefit from phsycia ltherapy to imrpoved LE strenght and  standing endurance.    Pt will benefit from skilled therapeutic intervention in order to improve on the following deficits Abnormal gait;Decreased activity tolerance;Decreased endurance;Difficulty walking;Decreased strength   Rehab Potential Good   Clinical Impairments Affecting Rehab Potential Pt with lumbar stenosis and deconditioning.    PT Frequency 2x / week   PT Duration 8 weeks   PT Treatment/Interventions Gait training;Functional mobility training;Therapeutic exercise;Patient/family education;Passive range of motion   PT Next Visit Plan Continue with ambulation, strength B LE/UE, may try standing exercises, continue UBE   PT Home Exercise Plan progress as needed   Consulted and Agree with Plan of Care Patient        Problem List Patient Active Problem List   Diagnosis Date Noted  . Generalized weakness 04/13/2015  . Lobar pneumonia due to unspecified organism 04/13/2015  . Prolonged QT interval   . Weakness 04/11/2015  . CAP (community acquired pneumonia) 01/25/2015  . Dehydration 01/25/2015  . Chronic diastolic heart failure (HCC[place]ona08/08/2014  . Essential hypertension, benign 03/08/2014  . Hypothyroidism 03/08/2014  . Protein-calorie malnutrition, moderate (HCP[place]04/29/2015  . Constipation 03/08/2014  . Physical deconditioning 03/08/2014  . Hypokalemia 02/27/2014  . Abnormal serum protein electrophoresis 01/07/2013  . Chronic kidney disease (CKD), stage III (moderate) 01/03/2013  . Polymyalgia rheumatica (HCScotsdale02/24/2014  . Osteopenia 01/03/2013  . Carotid artery disease (HCD[place]02/24/2014  . Spinal stenosis of lumbar region 01/03/2013  . Coronary artery disease 01/03/2013  . Old MI (myocardial infarction) 01/03/2013  . Unspecified  transient cerebral ischemia 11/18/2011    Tamyia Minich,PT 09/17/2015, 11:41 AM  [facility] [address] [place]one: [phone] Fax:  [phone]: JaROYALTY DOMAGALARN: [id number] of Birth: [birth year]  PHYSICAL THERAPY DISCHARGE SUMMARY  Visits from Start of Care: 13  Current functional level related to goals / functional outcomes: See above.  Patient aide called  on 09/19/2015 and reported the patient wanted to be discharged without a reason.    Remaining deficits: See above   Education / Equipment: HEP  Plan: Patient agrees to discharge.  Patient goals were not met. Patient is being discharged due to the patient's request. Thank you for the referral.  Earlie Counts, PT 09/19/2015 12:14 PM   ?????

## 2015-09-19 ENCOUNTER — Encounter: Payer: Medicare Other | Admitting: Physical Therapy

## 2015-09-24 ENCOUNTER — Encounter: Payer: Medicare Other | Admitting: Physical Therapy

## 2015-09-26 ENCOUNTER — Emergency Department (HOSPITAL_COMMUNITY): Payer: Medicare Other

## 2015-09-26 ENCOUNTER — Encounter: Payer: Medicare Other | Admitting: Physical Therapy

## 2015-09-26 ENCOUNTER — Encounter (HOSPITAL_COMMUNITY): Payer: Self-pay | Admitting: Emergency Medicine

## 2015-09-26 ENCOUNTER — Observation Stay (HOSPITAL_COMMUNITY)
Admission: EM | Admit: 2015-09-26 | Discharge: 2015-09-29 | Disposition: A | Discharge status: Home / Self Care | Payer: Medicare Other | Attending: Internal Medicine | Admitting: Internal Medicine

## 2015-09-26 DIAGNOSIS — I252 Old myocardial infarction: Secondary | ICD-10-CM | POA: Diagnosis not present

## 2015-09-26 DIAGNOSIS — I2510 Atherosclerotic heart disease of native coronary artery without angina pectoris: Secondary | ICD-10-CM | POA: Insufficient documentation

## 2015-09-26 DIAGNOSIS — R4182 Altered mental status, unspecified: Secondary | ICD-10-CM | POA: Diagnosis not present

## 2015-09-26 DIAGNOSIS — Z951 Presence of aortocoronary bypass graft: Secondary | ICD-10-CM | POA: Insufficient documentation

## 2015-09-26 DIAGNOSIS — I5032 Chronic diastolic (congestive) heart failure: Secondary | ICD-10-CM | POA: Insufficient documentation

## 2015-09-26 DIAGNOSIS — R400 Somnolence: Secondary | ICD-10-CM | POA: Insufficient documentation

## 2015-09-26 DIAGNOSIS — I129 Hypertensive chronic kidney disease with stage 1 through stage 4 chronic kidney disease, or unspecified chronic kidney disease: Secondary | ICD-10-CM | POA: Insufficient documentation

## 2015-09-26 DIAGNOSIS — I10 Essential (primary) hypertension: Secondary | ICD-10-CM | POA: Diagnosis present

## 2015-09-26 DIAGNOSIS — Z8673 Personal history of transient ischemic attack (TIA), and cerebral infarction without residual deficits: Secondary | ICD-10-CM | POA: Diagnosis not present

## 2015-09-26 DIAGNOSIS — Z7902 Long term (current) use of antithrombotics/antiplatelets: Secondary | ICD-10-CM | POA: Diagnosis not present

## 2015-09-26 DIAGNOSIS — G9340 Encephalopathy, unspecified: Principal | ICD-10-CM | POA: Insufficient documentation

## 2015-09-26 DIAGNOSIS — Z7982 Long term (current) use of aspirin: Secondary | ICD-10-CM | POA: Diagnosis not present

## 2015-09-26 DIAGNOSIS — E039 Hypothyroidism, unspecified: Secondary | ICD-10-CM | POA: Diagnosis not present

## 2015-09-26 DIAGNOSIS — M549 Dorsalgia, unspecified: Secondary | ICD-10-CM | POA: Insufficient documentation

## 2015-09-26 DIAGNOSIS — R5383 Other fatigue: Secondary | ICD-10-CM | POA: Insufficient documentation

## 2015-09-26 DIAGNOSIS — G8929 Other chronic pain: Secondary | ICD-10-CM | POA: Insufficient documentation

## 2015-09-26 DIAGNOSIS — Z79899 Other long term (current) drug therapy: Secondary | ICD-10-CM | POA: Diagnosis not present

## 2015-09-26 DIAGNOSIS — N183 Chronic kidney disease, stage 3 unspecified: Secondary | ICD-10-CM | POA: Diagnosis present

## 2015-09-26 DIAGNOSIS — Z905 Acquired absence of kidney: Secondary | ICD-10-CM | POA: Insufficient documentation

## 2015-09-26 DIAGNOSIS — Z87891 Personal history of nicotine dependence: Secondary | ICD-10-CM | POA: Insufficient documentation

## 2015-09-26 DIAGNOSIS — L8990 Pressure ulcer of unspecified site, unspecified stage: Secondary | ICD-10-CM | POA: Insufficient documentation

## 2015-09-26 DIAGNOSIS — R402421 Glasgow coma scale score 9-12, in the field [EMT or ambulance]: Secondary | ICD-10-CM | POA: Diagnosis not present

## 2015-09-26 DIAGNOSIS — M353 Polymyalgia rheumatica: Secondary | ICD-10-CM

## 2015-09-26 DIAGNOSIS — E7800 Pure hypercholesterolemia, unspecified: Secondary | ICD-10-CM | POA: Diagnosis not present

## 2015-09-26 DIAGNOSIS — I25810 Atherosclerosis of coronary artery bypass graft(s) without angina pectoris: Secondary | ICD-10-CM

## 2015-09-26 LAB — URINALYSIS, ROUTINE W REFLEX MICROSCOPIC
BILIRUBIN URINE: NEGATIVE
Glucose, UA: NEGATIVE mg/dL
HGB URINE DIPSTICK: NEGATIVE
Ketones, ur: NEGATIVE mg/dL
Leukocytes, UA: NEGATIVE
Nitrite: NEGATIVE
PH: 7 (ref 5.0–8.0)
Protein, ur: NEGATIVE mg/dL
SPECIFIC GRAVITY, URINE: 1.01 (ref 1.005–1.030)

## 2015-09-26 LAB — BASIC METABOLIC PANEL
Anion gap: 8 (ref 5–15)
BUN: 25 mg/dL — ABNORMAL HIGH (ref 6–20)
CHLORIDE: 103 mmol/L (ref 101–111)
CO2: 28 mmol/L (ref 22–32)
CREATININE: 1.47 mg/dL — AB (ref 0.61–1.24)
Calcium: 9.3 mg/dL (ref 8.9–10.3)
GFR calc non Af Amer: 39 mL/min — ABNORMAL LOW (ref >60)
GFR, EST AFRICAN AMERICAN: 46 mL/min — AB (ref >60)
Glucose, Bld: 124 mg/dL — ABNORMAL HIGH (ref 65–99)
Potassium: 4.1 mmol/L (ref 3.5–5.1)
Sodium: 139 mmol/L (ref 135–145)

## 2015-09-26 LAB — RAPID URINE DRUG SCREEN, HOSP PERFORMED
AMPHETAMINES: NOT DETECTED
Barbiturates: NOT DETECTED
Benzodiazepines: NOT DETECTED
Cocaine: NOT DETECTED
Opiates: NOT DETECTED
TETRAHYDROCANNABINOL: NOT DETECTED

## 2015-09-26 LAB — CBG MONITORING, ED: GLUCOSE-CAPILLARY: 123 mg/dL — AB (ref 65–99)

## 2015-09-26 LAB — HEPATIC FUNCTION PANEL
ALBUMIN: 3.3 g/dL — AB (ref 3.5–5.0)
ALK PHOS: 78 U/L (ref 38–126)
ALT: 15 U/L — ABNORMAL LOW (ref 17–63)
AST: 19 U/L (ref 15–41)
BILIRUBIN TOTAL: 0.6 mg/dL (ref 0.3–1.2)
Total Protein: 6.1 g/dL — ABNORMAL LOW (ref 6.5–8.1)

## 2015-09-26 LAB — ETHANOL

## 2015-09-26 LAB — CBC WITH DIFFERENTIAL/PLATELET
Basophils Absolute: 0 10*3/uL (ref 0.0–0.1)
Basophils Relative: 0 %
EOS ABS: 0.1 10*3/uL (ref 0.0–0.7)
Eosinophils Relative: 1 %
HEMATOCRIT: 41.6 % (ref 39.0–52.0)
HEMOGLOBIN: 13.9 g/dL (ref 13.0–17.0)
Lymphocytes Relative: 17 %
Lymphs Abs: 1.8 10*3/uL (ref 0.7–4.0)
MCH: 33.8 pg (ref 26.0–34.0)
MCHC: 33.4 g/dL (ref 30.0–36.0)
MCV: 101.2 fL — ABNORMAL HIGH (ref 78.0–100.0)
MONOS PCT: 5 %
Monocytes Absolute: 0.5 10*3/uL (ref 0.1–1.0)
NEUTROS ABS: 7.9 10*3/uL — AB (ref 1.7–7.7)
NEUTROS PCT: 77 %
Platelets: 203 10*3/uL (ref 150–400)
RBC: 4.11 MIL/uL — ABNORMAL LOW (ref 4.22–5.81)
RDW: 14.5 % (ref 11.5–15.5)
WBC: 10.2 10*3/uL (ref 4.0–10.5)

## 2015-09-26 MED ORDER — NALOXONE HCL 0.4 MG/ML IJ SOLN
0.2000 mg | Freq: Once | INTRAMUSCULAR | Status: AC
  Administered 2015-09-26: 0.2 mg via INTRAVENOUS
  Filled 2015-09-26: qty 1

## 2015-09-26 NOTE — ED Notes (Signed)
Bed: WA07 Expected date:  Expected time:  Means of arrival:  Comments: ems 

## 2015-09-26 NOTE — H&P (Signed)
[name] Admission History and Physical       PERFECTO BUR [id number] DOB: 1920/08/12 DOA: 09/26/2015  Referring physician: EDP PCP: Mathews Argyle, MD  Specialists:   Chief Complaint: Decreased Alertness  HPI: Barry Wright is a 81 y.o. male with a history of CAD, CHF, PMR, Stage III CKD who was brought to the ED due to increased Somnolence and lethargy.    He was observed in the ED to have alertness for a few minutes and then to have decreased responsiveness.    He was administered IV Narcan x 1 without change.   A Ct scan of the head was performed and was negative for acute findings.   Neurology was consulted, and he was referred for further evaluation.     Review of Systems: Unable to Obtain from the Patient  Past Medical History  Diagnosis Date  . Chronic back pain   . Hypercholesteremia   . Hypothyroidism   . CAD (coronary artery disease)   . Chronic kidney disease (CKD), stage III (moderate)   . Polymyalgia rheumatica ([place])   . Left bundle branch block   . Hx of CABG   . History of nephrectomy   . Internal carotid artery stenosis   . Myocardial infarction ([place])   . CHF (congestive heart failure) ([place])   . Stroke [facility])      Past Surgical History  Procedure Laterality Date  . Coronary artery bypass graft    . Cholecystectomy    . Kidney surgery      kidney removed      Prior to Admission medications   Medication Sig Start Date End Date Taking? Authorizing Provider  acetaminophen (TYLENOL) 500 MG tablet Take 1,000 mg by mouth 2 (two) times daily.    Yes Historical Provider, MD  aspirin 81 MG tablet Take 81 mg by mouth daily.   Yes Historical Provider, MD  calcium carbonate (OS-CAL - DOSED IN MG OF ELEMENTAL CALCIUM) 1250 MG tablet Take 1 tablet by mouth daily with breakfast.   Yes Historical Provider, MD  clopidogrel (PLAVIX) 75 MG tablet Take 75 mg by mouth daily.     Yes Historical Provider, MD  folic acid (FOLVITE) 1 MG tablet Take  1 mg by mouth daily.   Yes Historical Provider, MD  furosemide (LASIX) 20 MG tablet Take 0.5 tablets (10 mg total) by mouth daily. 01/30/15  Yes Theodis Blaze, MD  gabapentin (NEURONTIN) 100 MG capsule Take 100 mg by mouth 3 (three) times daily.  08/29/15  Yes Historical Provider, MD  methotrexate (RHEUMATREX) 2.5 MG tablet Take 10 mg by mouth every Wednesday.   Yes Historical Provider, MD  Multiple Vitamins-Minerals (MULTIVITAMINS THER. W/MINERALS) TABS Take 1 tablet by mouth daily.     Yes Historical Provider, MD  SYNTHROID 100 MCG tablet Take 100 mcg by mouth daily before breakfast.  09/19/15  Yes Historical Provider, MD  traMADol (ULTRAM) 50 MG tablet Take one tablet by mouth twice daily for pains Patient taking differently: Take 50 mg by mouth 2 (two) times daily.  03/06/14  Yes Tiffany L Reed, DO  levothyroxine (SYNTHROID, LEVOTHROID) 88 MCG tablet Take 88 mcg by mouth daily before breakfast.    Historical Provider, MD  nitroGLYCERIN (NITROSTAT) 0.4 MG SL tablet Place 0.4 mg under the tongue every 5 (five) minutes as needed for chest pain.    Historical Provider, MD  polyethylene glycol (MIRALAX / GLYCOLAX) packet Take 17 g by mouth daily as needed for mild constipation.  Historical Provider, MD     Allergies  Allergen Reactions  . Penicillins Rash    Social History:  reports that he quit smoking about 44 years ago. His smoking use included Cigarettes. He has never used smokeless tobacco. He reports that he does not drink alcohol or use illicit drugs.      Family History  Problem Relation Age of Onset  . Other Mother     [place] Flu  . Heart disease Father        Physical Exam:  [id number] Well Nourished and Well Developed 81 y.o. Caucasian male examined and in no acute distress; cooperative with exam Filed Vitals:   09/26/15 1911 09/26/15 1930 09/26/15 2030  BP: 130/83 116/72 158/66  Pulse: 82 80 81  Temp: 97.8 F (36.6 C)    TempSrc: Oral    Resp: 18 21 14   SpO2:  96% 94% 98%   Blood pressure 158/66, pulse 81, temperature 97.8 F (36.6 C), temperature source Oral, resp. rate 14, SpO2 98 %. PSYCH: He is alert and oriented x 0; HEENT: Normocephalic and Atraumatic, Mucous membranes pink; PERRLA; EOM intact; Fundi:  Benign;  No scleral icterus, Nares: Patent, Oropharynx: Clear,Poor Dentition,    Neck:  FROM, No Cervical Lymphadenopathy nor Thyromegaly or Carotid Bruit; No JVD; Breasts:: Not examined CHEST WALL: No tenderness CHEST: Normal respiration, clear to auscultation bilaterally HEART: Regular rate and rhythm; no murmurs rubs or gallops BACK: No kyphosis or scoliosis; No CVA tenderness ABDOMEN: Positive Bowel Sounds, Soft Non-Tender, No Rebound or Guarding; No Masses, No Organomegaly Rectal Exam: Not done EXTREMITIES: No Cyanosis, Clubbing, 3+ Pedal Edema; No Ulcerations. Genitalia: not examined PULSES: 2+ and symmetric SKIN: Normal hydration no rash or ulceration CNS:  Obtunded, Unable to  Cooperate with neuro Exam at this Time.   Vascular: pulses palpable throughout    Labs on Admission:  Basic Metabolic Panel:  Recent Labs Lab 09/26/15 1936  NA 139  K 4.1  CL 103  CO2 28  GLUCOSE 124*  BUN 25*  CREATININE 1.47*  CALCIUM 9.3   Liver Function Tests:  Recent Labs Lab 09/26/15 1936  AST 19  ALT 15*  ALKPHOS 78  BILITOT 0.6  PROT 6.1*  ALBUMIN 3.3*   No results for input(s): LIPASE, AMYLASE in the last 168 hours. No results for input(s): AMMONIA in the last 168 hours. CBC:  Recent Labs Lab 09/26/15 1936  WBC 10.2  NEUTROABS 7.9*  HGB 13.9  HCT 41.6  MCV 101.2*  PLT 203   Cardiac Enzymes: No results for input(s): CKTOTAL, CKMB, CKMBINDEX, TROPONINI in the last 168 hours.  BNP (last 3 results) No results for input(s): BNP in the last 8760 hours.  ProBNP (last 3 results) No results for input(s): PROBNP in the last 8760 hours.  CBG:  Recent Labs Lab 09/26/15 2021  GLUCAP 123*    Radiological Exams on  Admission: Dg Chest 2 View  09/26/2015  CLINICAL DATA:  Patient presents from home via EMS for AMS. EMS reports lethargy, recently treated for UTI. Hx CAD. Hx CHF. Hx MI. Hx heart surgery. Former smoker. Unable to obtain further hx from pt- pt falling asleep during exam. EXAM: CHEST - 2 VIEW COMPARISON:  05/08/2015 FINDINGS: Low lung volumes. Patchy atelectasis or infiltrate in the basilar segments of both lower lobes, left greater than right, new since prior exam. Heart size upper limits normal for technique. Previous CABG. Atheromatous aortic arch. No pneumothorax. No effusion. IMPRESSION: 1. Low volumes with bibasilar atelectasis/infiltrate,  left greater than right, new since prior exam. Electronically Signed   By: Lucrezia Europe M.D.   On: 09/26/2015 20:03   Ct Head Wo Contrast  09/26/2015  CLINICAL DATA:  Altered mental status, lethargy. Recent treatment for urinary tract infection. History of polymyalgia rheumatica, hypercholesterolemia, stroke. EXAM: CT HEAD WITHOUT CONTRAST TECHNIQUE: Contiguous axial images were obtained from the base of the skull through the vertex without intravenous contrast. COMPARISON:  MRI head October 31, 2014 FINDINGS: Moderate to severe ventriculomegaly on the basis of global parenchymal brain volume loss. No intraparenchymal hemorrhage, mass effect nor midline shift. Patchy supratentorial white matter hypodensities are within normal range for patient's age and though non-specific suggest sequelae of chronic small vessel ischemic disease. No acute large vascular territory infarcts. Old LEFT basal ganglia lacunar infarct. No abnormal extra-axial fluid collections. Basal cisterns are patent. Moderate calcific atherosclerosis of the carotid siphons. No skull fracture. The included ocular globes and orbital contents are non-suspicious. Status post bilateral ocular lens implants. The mastoid aircells and included paranasal sinuses are well-aerated. IMPRESSION: No acute intracranial  process. Stable changes including moderate-to-severe global brain atrophy and moderate to severe chronic small vessel ischemic disease. Electronically Signed   By: Elon Alas M.D.   On: 09/26/2015 21:09     EKG: Independently reviewed.         Assessment/Plan:   81 y.o. male with  Principal Problem:   1.     Acute encephalopathy   Telemetry Monitoring   Neuro Checks   MRI Brain   Check TSH   NPO while Obtunded   Neuro consulted   Active Problems:   2.    Chronic kidney disease (CKD), stage III (moderate)   Monitor BUN/Cr     3.    Polymyalgia rheumatica (HCC)   On Methotrexate and Folic Acid Rx     4.    Coronary artery disease   On Plavix, ASA     5.    Essential hypertension, benign   Monitor BPs       6.    Hypothyroidism   Continue Levothyroxine     7.    Chronic diastolic heart failure ([place])     8.    Chronic back pain   PRN IV Dilaudid     9.    DVT Prophylaxis   Lovenox    Code Status:     FULL CODE      Family Communication:   No Family Present    Disposition Plan:  Observation Status        Time spent: 29 Minutes      Theressa Millard [name] Pager [phone]   If [place] Please Contact the Day Rounding Team MD for [name]  If 7PM-7AM, Please Contact Night-Floor Coverage  [email or web address] Password [email or web address] 09/26/2015, 10:30 PM     ADDENDUM:   Patient was seen and examined on 09/26/2015

## 2015-09-26 NOTE — ED Provider Notes (Signed)
CSN: [id number]     Arrival date & time 09/26/15  1842 History   First MD Initiated Contact with Patient 09/26/15 1919     No chief complaint on file.    (Consider location/radiation/quality/duration/timing/severity/associated sxs/prior Treatment) The history is provided by the patient.   patient presented reportedly with altered mental status and urinary tract infection. May have been more weak. Patient states he feels slightly weak but otherwise is fine. No chest pain. No trouble breathing. No confusion. No dysuria. No headache.  Past Medical History  Diagnosis Date  . Chronic back pain   . Hypercholesteremia   . Hypothyroidism   . CAD (coronary artery disease)   . Chronic kidney disease (CKD), stage III (moderate)   . Polymyalgia rheumatica ([place])   . Left bundle branch block   . Hx of CABG   . History of nephrectomy   . Internal carotid artery stenosis   . Myocardial infarction ([place])   . CHF (congestive heart failure) ([place])   . Stroke [facility])    Past Surgical History  Procedure Laterality Date  . Coronary artery bypass graft    . Cholecystectomy    . Kidney surgery      kidney removed   Family History  Problem Relation Age of Onset  . Other Mother     [place] Flu  . Heart disease Father    Social History  Substance Use Topics  . Smoking status: Former Smoker    Types: Cigarettes    Quit date: 11/10/1970  . Smokeless tobacco: Never Used  . Alcohol Use: No    Review of Systems  Constitutional: Positive for fatigue. Negative for activity change and appetite change.  Eyes: Negative for pain.  Respiratory: Negative for chest tightness and shortness of breath.   Cardiovascular: Negative for chest pain and leg swelling.  Gastrointestinal: Negative for nausea, vomiting, abdominal pain and diarrhea.  Genitourinary: Negative for flank pain.  Musculoskeletal: Negative for back pain and neck stiffness.  Skin: Negative for rash.  Neurological: Negative for weakness,  numbness and headaches.  Psychiatric/Behavioral: Negative for behavioral problems.      Allergies  Penicillins  Home Medications   Prior to Admission medications   Medication Sig Start Date End Date Taking? Authorizing Provider  acetaminophen (TYLENOL) 500 MG tablet Take 1,000 mg by mouth 2 (two) times daily.    Yes Historical Provider, MD  aspirin 81 MG tablet Take 81 mg by mouth daily.   Yes Historical Provider, MD  calcium carbonate (OS-CAL - DOSED IN MG OF ELEMENTAL CALCIUM) 1250 MG tablet Take 1 tablet by mouth daily with breakfast.   Yes Historical Provider, MD  clopidogrel (PLAVIX) 75 MG tablet Take 75 mg by mouth daily.     Yes Historical Provider, MD  folic acid (FOLVITE) 1 MG tablet Take 1 mg by mouth daily.   Yes Historical Provider, MD  furosemide (LASIX) 20 MG tablet Take 0.5 tablets (10 mg total) by mouth daily. 01/30/15  Yes Theodis Blaze, MD  gabapentin (NEURONTIN) 100 MG capsule Take 100 mg by mouth 3 (three) times daily.  08/29/15  Yes Historical Provider, MD  methotrexate (RHEUMATREX) 2.5 MG tablet Take 10 mg by mouth every Wednesday.   Yes Historical Provider, MD  Multiple Vitamins-Minerals (MULTIVITAMINS THER. W/MINERALS) TABS Take 1 tablet by mouth daily.     Yes Historical Provider, MD  SYNTHROID 100 MCG tablet Take 100 mcg by mouth daily before breakfast.  09/19/15  Yes Historical Provider, MD  traMADol (  ULTRAM) 50 MG tablet Take one tablet by mouth twice daily for pains Patient taking differently: Take 50 mg by mouth 2 (two) times daily.  03/06/14  Yes Tiffany L Reed, DO  levothyroxine (SYNTHROID, LEVOTHROID) 88 MCG tablet Take 88 mcg by mouth daily before breakfast.    Historical Provider, MD  nitroGLYCERIN (NITROSTAT) 0.4 MG SL tablet Place 0.4 mg under the tongue every 5 (five) minutes as needed for chest pain.    Historical Provider, MD  polyethylene glycol (MIRALAX / GLYCOLAX) packet Take 17 g by mouth daily as needed for mild constipation.     Historical  Provider, MD   BP 152/87 mmHg  Pulse 81  Temp(Src) 97.7 F (36.5 C) (Oral)  Resp 16  Ht 5\' 11"  (1.803 m)  Wt 185 lb 10 oz (84.2 kg)  BMI 25.90 kg/m2  SpO2 97% Physical Exam  Constitutional: He is oriented to person, place, and time. He appears well-developed and well-nourished.  HENT:  Head: Atraumatic.  Neck: Neck supple.  Cardiovascular: Normal rate and regular rhythm.   Pulmonary/Chest: Effort normal.  Abdominal: Soft.  Genitourinary:  Foley catheter in place  Musculoskeletal: He exhibits edema.  Edema to bilateral feet and lower legs.  Neurological: He is alert and oriented to person, place, and time.  Skin: Skin is warm.  Psychiatric: He has a normal mood and affect.    ED Course  Procedures (including critical care time) Labs Review Labs Reviewed  BASIC METABOLIC PANEL - Abnormal; Notable for the following:    Glucose, Bld 124 (*)    BUN 25 (*)    Creatinine, Ser 1.47 (*)    GFR calc non Af Amer 39 (*)    GFR calc Af Amer 46 (*)    All other components within normal limits  CBC WITH DIFFERENTIAL/PLATELET - Abnormal; Notable for the following:    RBC 4.11 (*)    MCV 101.2 (*)    Neutro Abs 7.9 (*)    All other components within normal limits  HEPATIC FUNCTION PANEL - Abnormal; Notable for the following:    Total Protein 6.1 (*)    Albumin 3.3 (*)    ALT 15 (*)    Bilirubin, Direct <0.1 (*)    All other components within normal limits  CBG MONITORING, ED - Abnormal; Notable for the following:    Glucose-Capillary 123 (*)    All other components within normal limits  URINALYSIS, ROUTINE W REFLEX MICROSCOPIC (NOT AT [facility])  ETHANOL  URINE RAPID DRUG SCREEN, HOSP PERFORMED  BASIC METABOLIC PANEL  CBC    Imaging Review Dg Chest 2 View  09/26/2015  CLINICAL DATA:  Patient presents from home via EMS for AMS. EMS reports lethargy, recently treated for UTI. Hx CAD. Hx CHF. Hx MI. Hx heart surgery. Former smoker. Unable to obtain further hx from pt- pt falling  asleep during exam. EXAM: CHEST - 2 VIEW COMPARISON:  05/08/2015 FINDINGS: Low lung volumes. Patchy atelectasis or infiltrate in the basilar segments of both lower lobes, left greater than right, new since prior exam. Heart size upper limits normal for technique. Previous CABG. Atheromatous aortic arch. No pneumothorax. No effusion. IMPRESSION: 1. Low volumes with bibasilar atelectasis/infiltrate, left greater than right, new since prior exam. Electronically Signed   By: Lucrezia Europe M.D.   On: 09/26/2015 20:03   Ct Head Wo Contrast  09/26/2015  CLINICAL DATA:  Altered mental status, lethargy. Recent treatment for urinary tract infection. History of polymyalgia rheumatica, hypercholesterolemia, stroke. EXAM: CT  HEAD WITHOUT CONTRAST TECHNIQUE: Contiguous axial images were obtained from the base of the skull through the vertex without intravenous contrast. COMPARISON:  MRI head October 31, 2014 FINDINGS: Moderate to severe ventriculomegaly on the basis of global parenchymal brain volume loss. No intraparenchymal hemorrhage, mass effect nor midline shift. Patchy supratentorial white matter hypodensities are within normal range for patient's age and though non-specific suggest sequelae of chronic small vessel ischemic disease. No acute large vascular territory infarcts. Old LEFT basal ganglia lacunar infarct. No abnormal extra-axial fluid collections. Basal cisterns are patent. Moderate calcific atherosclerosis of the carotid siphons. No skull fracture. The included ocular globes and orbital contents are non-suspicious. Status post bilateral ocular lens implants. The mastoid aircells and included paranasal sinuses are well-aerated. IMPRESSION: No acute intracranial process. Stable changes including moderate-to-severe global brain atrophy and moderate to severe chronic small vessel ischemic disease. Electronically Signed   By: Elon Alas M.D.   On: 09/26/2015 21:09   I have personally reviewed and evaluated  these images and lab results as part of my medical decision-making.   EKG Interpretation None     ED ECG REPORT   Date: 09/27/2015  Rate: 79  Rhythm: normal sinus rhythm  QRS Axis: normal  Intervals: normal  ST/T Wave abnormalities: normal  Conduction Disutrbances:right bundle branch block  Narrative Interpretation:   Old EKG Reviewed: unchanged   MDM   Final diagnoses:  Altered mental status, unspecified altered mental status type    While in the ER patient had another episode of unresponsiveness. Had bilateral weakness potentially with it. Right arm held above his head would fall and hit him in the face left side was not as much. He would open his eyes to deep stimulation but was minimally verbal. Later it completely resolved and does not remember the episode happening. No seizure activity with.  No improvement with Narcan although pupils were small with the episode. Head CT reassuring. Will admit to internal medicine with neurology consult for further evaluation.     Davonna Belling, MD 09/27/15 [phone]

## 2015-09-26 NOTE — ED Notes (Signed)
Patient presents from home via EMS for AMS. EMS reports lethargy, recently treated for UTI.  CBG 127, 134/80, 84hr, 97% 2L.

## 2015-09-27 ENCOUNTER — Observation Stay (HOSPITAL_COMMUNITY): Payer: Medicare Other

## 2015-09-27 ENCOUNTER — Observation Stay (HOSPITAL_COMMUNITY)
Admit: 2015-09-27 | Discharge: 2015-09-27 | Disposition: A | Discharge status: Home / Self Care | Payer: Medicare Other | Attending: Neurology | Admitting: Neurology

## 2015-09-27 DIAGNOSIS — I10 Essential (primary) hypertension: Secondary | ICD-10-CM

## 2015-09-27 DIAGNOSIS — N183 Chronic kidney disease, stage 3 (moderate): Secondary | ICD-10-CM | POA: Diagnosis not present

## 2015-09-27 DIAGNOSIS — R410 Disorientation, unspecified: Secondary | ICD-10-CM | POA: Diagnosis not present

## 2015-09-27 DIAGNOSIS — I5032 Chronic diastolic (congestive) heart failure: Secondary | ICD-10-CM | POA: Diagnosis not present

## 2015-09-27 DIAGNOSIS — G9340 Encephalopathy, unspecified: Secondary | ICD-10-CM | POA: Insufficient documentation

## 2015-09-27 DIAGNOSIS — R4182 Altered mental status, unspecified: Secondary | ICD-10-CM | POA: Diagnosis not present

## 2015-09-27 LAB — BASIC METABOLIC PANEL
ANION GAP: 8 (ref 5–15)
BUN: 24 mg/dL — AB (ref 6–20)
CALCIUM: 9.2 mg/dL (ref 8.9–10.3)
CO2: 28 mmol/L (ref 22–32)
Chloride: 107 mmol/L (ref 101–111)
Creatinine, Ser: 1.39 mg/dL — ABNORMAL HIGH (ref 0.61–1.24)
GFR calc Af Amer: 49 mL/min — ABNORMAL LOW (ref >60)
GFR, EST NON AFRICAN AMERICAN: 42 mL/min — AB (ref >60)
Glucose, Bld: 111 mg/dL — ABNORMAL HIGH (ref 65–99)
POTASSIUM: 3.7 mmol/L (ref 3.5–5.1)
SODIUM: 143 mmol/L (ref 135–145)

## 2015-09-27 LAB — CBC
HCT: 40 % (ref 39.0–52.0)
HEMOGLOBIN: 13 g/dL (ref 13.0–17.0)
MCH: 33.4 pg (ref 26.0–34.0)
MCHC: 32.5 g/dL (ref 30.0–36.0)
MCV: 102.8 fL — ABNORMAL HIGH (ref 78.0–100.0)
Platelets: 194 10*3/uL (ref 150–400)
RBC: 3.89 MIL/uL — AB (ref 4.22–5.81)
RDW: 14.6 % (ref 11.5–15.5)
WBC: 8.3 10*3/uL (ref 4.0–10.5)

## 2015-09-27 LAB — VITAMIN B12: VITAMIN B 12: 769 pg/mL (ref 180–914)

## 2015-09-27 LAB — FOLATE

## 2015-09-27 MED ORDER — ONDANSETRON HCL 4 MG/2ML IJ SOLN: 4.0000 mg | Freq: Four times a day (QID) | INTRAMUSCULAR | Status: DC | PRN

## 2015-09-27 MED ORDER — SODIUM CHLORIDE 0.9 % IJ SOLN
3.0000 mL | Freq: Two times a day (BID) | INTRAMUSCULAR | Status: DC
  Administered 2015-09-28: 3 mL via INTRAVENOUS

## 2015-09-27 MED ORDER — LEVOTHYROXINE SODIUM 100 MCG PO TABS
100.0000 ug | ORAL_TABLET | Freq: Every day | ORAL | Status: DC
  Administered 2015-09-27 – 2015-09-29 (×3): 100 ug via ORAL
  Filled 2015-09-27 (×3): qty 1

## 2015-09-27 MED ORDER — TRAMADOL HCL 50 MG PO TABS
50.0000 mg | ORAL_TABLET | Freq: Two times a day (BID) | ORAL | Status: DC
  Administered 2015-09-27 – 2015-09-29 (×5): 50 mg via ORAL
  Filled 2015-09-27 (×5): qty 1

## 2015-09-27 MED ORDER — OXYCODONE HCL 5 MG PO TABS: 5.0000 mg | ORAL_TABLET | ORAL | Status: DC | PRN

## 2015-09-27 MED ORDER — ACETAMINOPHEN 650 MG RE SUPP: 650.0000 mg | Freq: Four times a day (QID) | RECTAL | Status: DC | PRN

## 2015-09-27 MED ORDER — ACETAMINOPHEN 325 MG PO TABS: 650.0000 mg | ORAL_TABLET | Freq: Four times a day (QID) | ORAL | Status: DC | PRN

## 2015-09-27 MED ORDER — GADOBENATE DIMEGLUMINE 529 MG/ML IV SOLN
10.0000 mL | Freq: Once | INTRAVENOUS | Status: AC | PRN
  Administered 2015-09-27: 10 mL via INTRAVENOUS

## 2015-09-27 MED ORDER — HYDROMORPHONE HCL 1 MG/ML IJ SOLN: 0.5000 mg | INTRAMUSCULAR | Status: DC | PRN

## 2015-09-27 MED ORDER — LEVOTHYROXINE SODIUM 88 MCG PO TABS: 88.0000 ug | ORAL_TABLET | Freq: Every day | ORAL | Status: DC

## 2015-09-27 MED ORDER — POLYETHYLENE GLYCOL 3350 17 G PO PACK: 17.0000 g | PACK | Freq: Every day | ORAL | Status: DC | PRN

## 2015-09-27 MED ORDER — ASPIRIN EC 81 MG PO TBEC
81.0000 mg | DELAYED_RELEASE_TABLET | Freq: Every day | ORAL | Status: DC
  Administered 2015-09-27 – 2015-09-29 (×3): 81 mg via ORAL
  Filled 2015-09-27 (×3): qty 1

## 2015-09-27 MED ORDER — METHOTREXATE (ANTI-RHEUMATIC) 2.5 MG PO TABS: 10.0000 mg | ORAL_TABLET | ORAL | Status: DC

## 2015-09-27 MED ORDER — CLOPIDOGREL BISULFATE 75 MG PO TABS
75.0000 mg | ORAL_TABLET | Freq: Every day | ORAL | Status: DC
  Administered 2015-09-27 – 2015-09-29 (×3): 75 mg via ORAL
  Filled 2015-09-27 (×3): qty 1

## 2015-09-27 MED ORDER — GABAPENTIN 100 MG PO CAPS
100.0000 mg | ORAL_CAPSULE | Freq: Three times a day (TID) | ORAL | Status: DC
Start: 2015-09-27 — End: 2015-09-27
  Administered 2015-09-27: 100 mg via ORAL
  Filled 2015-09-27 (×2): qty 1

## 2015-09-27 MED ORDER — ALUM & MAG HYDROXIDE-SIMETH 200-200-20 MG/5ML PO SUSP: 30.0000 mL | Freq: Four times a day (QID) | ORAL | Status: DC | PRN

## 2015-09-27 MED ORDER — FOLIC ACID 1 MG PO TABS
1.0000 mg | ORAL_TABLET | Freq: Every day | ORAL | Status: DC
  Administered 2015-09-27 – 2015-09-29 (×3): 1 mg via ORAL
  Filled 2015-09-27 (×3): qty 1

## 2015-09-27 MED ORDER — SODIUM CHLORIDE 0.9 % IV SOLN
INTRAVENOUS | Status: DC
  Administered 2015-09-27 – 2015-09-28 (×2): via INTRAVENOUS

## 2015-09-27 MED ORDER — ONDANSETRON HCL 4 MG PO TABS: 4.0000 mg | ORAL_TABLET | Freq: Four times a day (QID) | ORAL | Status: DC | PRN

## 2015-09-27 MED ORDER — ENOXAPARIN SODIUM 30 MG/0.3ML ~~LOC~~ SOLN
30.0000 mg | Freq: Every day | SUBCUTANEOUS | Status: DC
  Administered 2015-09-27: 30 mg via SUBCUTANEOUS
  Filled 2015-09-27: qty 0.3

## 2015-09-27 MED ORDER — ENOXAPARIN SODIUM 40 MG/0.4ML ~~LOC~~ SOLN
40.0000 mg | Freq: Every day | SUBCUTANEOUS | Status: DC
  Administered 2015-09-27 – 2015-09-28 (×2): 40 mg via SUBCUTANEOUS
  Filled 2015-09-27 (×2): qty 0.4

## 2015-09-27 NOTE — Procedures (Signed)
History: 81 yo M with lethargy yesterday.  Sedation: None  Technique: This is a 21 channel routine scalp EEG performed at the bedside with bipolar and monopolar montages arranged in accordance to the international 10/20 system of electrode placement. One channel was dedicated to EKG recording.   Background: The background consists of intermixed alpha and beta activities. There is a well defined posterior dominant rhythm of 7.5 Hz that attenuates with eye opening. Sleep is not recorded.   Photic stimulation: Physiologic driving is not performed  EEG Abnormalities: 1) Slow PDR  Clinical Interpretation: This EEG could be consistent with a mild encephalopathy due to the slow PDR seen, however I doubt the slow PDR is of clinical significance as this finding can be seen with aging. There was no seizure or seizure predisposition recorded on this study.   Roland Rack, MD [facility] [phone]  If 7pm- 7am, please page neurology on call as listed in [place].

## 2015-09-27 NOTE — Evaluation (Signed)
Clinical/Bedside Swallow Evaluation Patient Details  Name: Barry Wright MRN: [id number] Date of Birth: [birth year]  Today's Date: 09/27/2015 Time: SLP Start Time (ACUTE ONLY): 1140 SLP Stop Time (ACUTE ONLY): 1214 SLP Time Calculation (min) (ACUTE ONLY): 34 min  Past Medical History:  Past Medical History  Diagnosis Date  . Chronic back pain   . Hypercholesteremia   . Hypothyroidism   . CAD (coronary artery disease)   . Chronic kidney disease (CKD), stage III (moderate)   . Polymyalgia rheumatica ([place])   . Left bundle branch block   . Hx of CABG   . History of nephrectomy   . Internal carotid artery stenosis   . Myocardial infarction ([place])   . CHF (congestive heart failure) ([place])   . Stroke [facility])    Past Surgical History:  Past Surgical History  Procedure Laterality Date  . Coronary artery bypass graft    . Cholecystectomy    . Kidney surgery      kidney removed   HPI:  81 yo male adm to [facility] with AMS, pt CT head negative.  CXR showed low lung volume with ATX/infiltrate - left more than right - new since last xray.   PMH + for CHF, Polyrheumatic myalgia, HTN, chronic back pain.    Pt reports he has had pna a few times = indicating one was this year.     Assessment / Plan / Recommendation Clinical Impression  Pt presents with functional oropharyngeal swallow - no indications of aspiration or airway compromise.  Swallow was timely with adequate mastication and no residuals.  Recommend regular/thin diet.  Pt denies any symptoms of reflux or heartburn. Of note, pt does report issues with hoarseness in the am recently - spouse denies this occuring daily but offered pt a cough drop to help.      Aspiration Risk  No limitations    Diet Recommendation   regular/thin  Medication Administration: Whole meds with liquid    Other  Recommendations   n/a  Follow up Recommendations    n/a   Frequency and Duration   n/a         Swallow Study   General Date of Onset:  09/27/15 HPI: 81 yo male adm to [facility] with AMS, pt CT head negative.  CXR showed low lung volume with ATX/infiltrate - left more than right - new since last xray.   PMH + for CHF, Polyrheumatic myalgia, HTN, chronic back pain.    Pt reports he has had pna a few times = indicating one was this year.   Type of Study: Bedside Swallow Evaluation Previous Swallow Assessment: June 2016, March 2016 [facility]  Diet Prior to this Study: NPO Temperature Spikes Noted: No Respiratory Status: Room air History of Recent Intubation: No Behavior/Cognition: Alert;Cooperative;Pleasant mood Oral Cavity Assessment: Within Functional Limits Oral Care Completed by SLP: No Oral Cavity - Dentition: Adequate natural dentition Vision: Functional for self-feeding Self-Feeding Abilities: Able to feed self;Needs set up Patient Positioning: Upright in bed Baseline Vocal Quality: Normal;Hoarse (pt admits to voice hoarseness) Volitional Cough: Strong Volitional Swallow:  (pt xerostomic, unable to elicit initially)    Oral/Motor/Sensory Function Overall Oral Motor/Sensory Function: Generalized oral weakness   Ice Chips Ice chips: Not tested   Thin Liquid Thin Liquid: Within functional limits Presentation: Cup;Straw;Self Fed    Nectar Thick Nectar Thick Liquid: Not tested   Honey Thick Honey Thick Liquid: Not tested   Puree Puree: Within functional limits Presentation: Self Fed;Spoon   Solid Solid:  Within functional limits Presentation: [place], [place] [facility] SLP [phone]

## 2015-09-27 NOTE — Consult Note (Signed)
Neurology Consultation Reason for Consult: Lethargy Referring Physician: Aileen Fass, A  CC: Somnolence  History is obtained from: Patient, wife  HPI: Barry Wright is a 81 y.o. male who presents with somnolence. His wife states that he likes to sleep a lot, and sleeps much of the day. She tried to wake him yesterday, however, and was unable to. For this reason, he was brought to the ER. He was given narcan without response. Of note, he had been out of tramadol and just recently got it refilled.   Today, he is back to his normal self.   ROS: A 14 point ROS was performed and is negative except as noted in the HPI.   Past Medical History  Diagnosis Date  . Chronic back pain   . Hypercholesteremia   . Hypothyroidism   . CAD (coronary artery disease)   . Chronic kidney disease (CKD), stage III (moderate)   . Polymyalgia rheumatica ([place])   . Left bundle branch block   . Hx of CABG   . History of nephrectomy   . Internal carotid artery stenosis   . Myocardial infarction ([place])   . CHF (congestive heart failure) ([place])   . Stroke [facility])      Family History  Problem Relation Age of Onset  . Other Mother     [place] Flu  . Heart disease Father      Social History:  reports that he quit smoking about 44 years ago. His smoking use included Cigarettes. He has never used smokeless tobacco. He reports that he does not drink alcohol or use illicit drugs.   Exam: Current vital signs: BP 140/49 mmHg  Pulse 69  Temp(Src) 97.9 F (36.6 C) (Oral)  Resp 16  Ht 5\' 11"  (1.803 m)  Wt 84.2 kg (185 lb 10 oz)  BMI 25.90 kg/m2  SpO2 95% Vital signs in last 24 hours: Temp:  [97.7 F (36.5 C)-97.9 F (36.6 C)] 97.9 F (36.6 C) (11/17 0415) Pulse Rate:  [69-82] 69 (11/17 0415) Resp:  [13-21] 16 (11/17 0415) BP: (112-158)/(49-87) 140/49 mmHg (11/17 0415) SpO2:  [93 %-98 %] 95 % (11/17 0415) Weight:  [84.2 kg (185 lb 10 oz)] 84.2 kg (185 lb 10 oz) (11/17 0028)   Physical Exam   Constitutional: Appears elderly.  Psych: Affect appropriate to situation Eyes: No scleral injection HENT: No OP obstrucion Head: Normocephalic.  Cardiovascular: Normal rate  Respiratory: Effort normal  GI: Soft.  No distension. There is no tenderness.  Skin: WDI  Neuro: Mental Status: Patient is awake, alert, oriented to person, place, month, gives year as 2006(states "that's what I meant" when I tell him it is 2016) Patient is able to give a clear and coherent history. No signs of aphasia or neglect Cranial Nerves: II: Visual Fields are full. Pupils are equal, round, and reactive to light.   III,IV, VI: EOMI without ptosis or diploplia.  V: Facial sensation is symmetric to temperature VII: Facial movement is symmetric.  VIII: hearing is intact to voice X: Uvula elevates symmetrically XI: Shoulder shrug is symmetric. XII: tongue is midline without atrophy or fasciculations.  Motor: Tone is normal. Bulk is normal. 5/5 strength was present in bilateral arms, he has some weakness, ? Due to pain in his right leg, but at least 4/5. Good strength in the left leg.  Sensory: Sensation is symmetric to light touch  Cerebellar: No clear ataxia.    I have reviewed labs in epic and the results pertinent to this  consultation are: GFR 39 MCV 102  I have reviewed the images obtained:CT head - atrophy  Impression: 81 yo F with lethargy of unclear etiology. I am not sure why he was so hard to awaken, but with his chronic fatigue, it may be worth stopping gabapentin. He may   Recommendations: 1) EEG 2) discontinue gabapentin, may need to re-address risk/benefit if he becomes more awake without it.  3) Tramadol can cause sedation sometimes, may consider limiting this, but still need to account for risk benefit with pain.  4) Check B12, if < 200 treat with perenteral cobalamin, if 200-400 then send MMA to assess for need for cobalamin.  5) If EEG is negative, then no further recommendations  other than d/c of the gabapentin. Please call with any further questions.    Roland Rack, MD [facility] [phone]  If 7pm- 7am, please page neurology on call as listed in [place].

## 2015-09-27 NOTE — Care Management Obs Status (Signed)
[place] NOTIFICATION   Patient Details  Name: Barry Wright MRN: [id number] Date of Birth: Mar 04, 1920   Medicare Observation Status Notification Given:  Yes    Guadalupe Maple, RN 09/27/2015, 3:26 PM

## 2015-09-27 NOTE — Progress Notes (Signed)
EEG completed; results pending.    

## 2015-09-27 NOTE — Progress Notes (Signed)
[name] PROGRESS NOTE    Progress Note   Barry Wright [id number] DOB: Jan 20, 1920 DOA: 09/26/2015 PCP: Mathews Argyle, MD   Brief Narrative:   Barry Wright is an 81 y.o. male past medical history of CHF, chronic before meals stage III who is brought to the ED because of increased somnolence and lethargy.  Assessment/Plan:   Acute encephalopathy: - Unclear picture, the patient and wife cannot give a detailed history about the events. The wife did not remember if any extra medications were given her was her any confusion with the medication they do have a caregiver who comes at home and helps him out. He does relate this has happened a few times in the past. - He did not lose control of the sphincters, we'll get EEG and an MRI. Neurology consults pending. - Place him nothing by mouth do a swallowing evaluation. Has remained afebrile with no leukocytosis.his infection. - His encephalopathy has resolved today, he is awake alert oriented 3 hewas the President of the [place] is. Is currently nothing by mouth and is asking for food. He is also asking me to call his wife and her an update.  Chronic kidney disease (CKD), stage III (moderate): Cr. has remained at baseline no changes made.  Polymyalgia rheumatica ([place]): Continue MTX and folic acid.  Coronary artery disease Continue aspirin and Plavix.  Essential hypertension, benign Has remained stable continue current meds.  Hypothyroidism Continue Synthroid.  Chronic diastolic heart failure (HCC) Euvolemic.  Chronic back pain D/C Dilaudid.  DVT Prophylaxis - Lovenox ordered.  Family Communication: spoke with over the phone. Disposition Plan: Home when stable. Code Status:     Code Status Orders        Start     Ordered   09/27/15 0029  Full code   Continuous     09/27/15 0028        IV Access:    Peripheral IV   Procedures and diagnostic studies:   Dg Chest 2  View  09/26/2015  CLINICAL DATA:  Patient presents from home via EMS for AMS. EMS reports lethargy, recently treated for UTI. Hx CAD. Hx CHF. Hx MI. Hx heart surgery. Former smoker. Unable to obtain further hx from pt- pt falling asleep during exam. EXAM: CHEST - 2 VIEW COMPARISON:  05/08/2015 FINDINGS: Low lung volumes. Patchy atelectasis or infiltrate in the basilar segments of both lower lobes, left greater than right, new since prior exam. Heart size upper limits normal for technique. Previous CABG. Atheromatous aortic arch. No pneumothorax. No effusion. IMPRESSION: 1. Low volumes with bibasilar atelectasis/infiltrate, left greater than right, new since prior exam. Electronically Signed   By: Lucrezia Europe M.D.   On: 09/26/2015 20:03   Ct Head Wo Contrast  09/26/2015  CLINICAL DATA:  Altered mental status, lethargy. Recent treatment for urinary tract infection. History of polymyalgia rheumatica, hypercholesterolemia, stroke. EXAM: CT HEAD WITHOUT CONTRAST TECHNIQUE: Contiguous axial images were obtained from the base of the skull through the vertex without intravenous contrast. COMPARISON:  MRI head October 31, 2014 FINDINGS: Moderate to severe ventriculomegaly on the basis of global parenchymal brain volume loss. No intraparenchymal hemorrhage, mass effect nor midline shift. Patchy supratentorial white matter hypodensities are within normal range for patient's age and though non-specific suggest sequelae of chronic small vessel ischemic disease. No acute large vascular territory infarcts. Old LEFT basal ganglia lacunar infarct. No abnormal extra-axial fluid collections. Basal cisterns are patent. Moderate calcific atherosclerosis of the carotid siphons. No skull  fracture. The included ocular globes and orbital contents are non-suspicious. Status post bilateral ocular lens implants. The mastoid aircells and included paranasal sinuses are well-aerated. IMPRESSION: No acute intracranial process. Stable changes  including moderate-to-severe global brain atrophy and moderate to severe chronic small vessel ischemic disease. Electronically Signed   By: Elon Alas M.D.   On: 09/26/2015 21:09     Medical Consultants:    None.  Anti-Infectives:   Anti-infectives    None      Subjective:    [place] patient relates he is hungry. Has no new complaints.  Objective:    Filed Vitals:   09/26/15 2230 09/26/15 2300 09/27/15 0028 09/27/15 0415  BP: 112/67 134/74 152/87 140/49  Pulse: 75 75 81 69  Temp:   97.7 F (36.5 C) 97.9 F (36.6 C)  TempSrc:   Oral Oral  Resp: 13 16 16 16   Height:   5\' 11"  (1.803 m)   Weight:   84.2 kg (185 lb 10 oz)   SpO2: 93% 95% 97% 95%    Intake/Output Summary (Last 24 hours) at 09/27/15 0811 Last data filed at 09/27/15 0456  Gross per 24 hour  Intake    310 ml  Output      0 ml  Net    310 ml   Filed Weights   09/27/15 0028  Weight: 84.2 kg (185 lb 10 oz)    Exam: Gen:  NAD Cardiovascular:  RRR, No M/R/G Chest and lungs:   CTAB Abdomen:  Abdomen soft, NT/ND, + BS Extremities:  No C/E/C   Data Reviewed:    Labs: Basic Metabolic Panel:  Recent Labs Lab 09/26/15 1936 09/27/15 0537  NA 139 143  K 4.1 3.7  CL 103 107  CO2 28 28  GLUCOSE 124* 111*  BUN 25* 24*  CREATININE 1.47* 1.39*  CALCIUM 9.3 9.2   GFR Estimated Creatinine Clearance: 35.4 mL/min (by C-G formula based on Cr of 1.39). Liver Function Tests:  Recent Labs Lab 09/26/15 1936  AST 19  ALT 15*  ALKPHOS 78  BILITOT 0.6  PROT 6.1*  ALBUMIN 3.3*   No results for input(s): LIPASE, AMYLASE in the last 168 hours. No results for input(s): AMMONIA in the last 168 hours. Coagulation profile No results for input(s): INR, PROTIME in the last 168 hours.  CBC:  Recent Labs Lab 09/26/15 1936 09/27/15 0537  WBC 10.2 8.3  NEUTROABS 7.9*  --   HGB 13.9 13.0  HCT 41.6 40.0  MCV 101.2* 102.8*  PLT 203 194   Cardiac Enzymes: No results for input(s):  CKTOTAL, CKMB, CKMBINDEX, TROPONINI in the last 168 hours. BNP (last 3 results) No results for input(s): PROBNP in the last 8760 hours. CBG:  Recent Labs Lab 09/26/15 2021  GLUCAP 123*   D-Dimer: No results for input(s): DDIMER in the last 72 hours. Hgb A1c: No results for input(s): HGBA1C in the last 72 hours. Lipid Profile: No results for input(s): CHOL, HDL, LDLCALC, TRIG, CHOLHDL, LDLDIRECT in the last 72 hours. Thyroid function studies: No results for input(s): TSH, T4TOTAL, T3FREE, THYROIDAB in the last 72 hours.  Invalid input(s): FREET3 Anemia work up: No results for input(s): VITAMINB12, FOLATE, FERRITIN, TIBC, IRON, RETICCTPCT in the last 72 hours. Sepsis Labs:  Recent Labs Lab 09/26/15 1936 09/27/15 0537  WBC 10.2 8.3   Microbiology No results found for this or any previous visit (from the past 240 hour(s)).   Medications:   . enoxaparin (LOVENOX) injection  30  mg Subcutaneous QHS  . sodium chloride  3 mL Intravenous Q12H   Continuous Infusions: . sodium chloride 75 mL/hr at 09/27/15 0048    Time spent: 25 min     Barry Wright  [name] Pager [phone]  *Please refer to [email or web address], password [email or web address] to get updated schedule on who will round on this patient, as hospitalists switch teams weekly. If 7PM-7AM, please contact night-coverage at [email or web address], password [email or web address] for any overnight needs.  09/27/2015, 8:11 AM

## 2015-09-28 DIAGNOSIS — I10 Essential (primary) hypertension: Secondary | ICD-10-CM | POA: Diagnosis not present

## 2015-09-28 DIAGNOSIS — G9340 Encephalopathy, unspecified: Secondary | ICD-10-CM | POA: Diagnosis not present

## 2015-09-28 DIAGNOSIS — I5032 Chronic diastolic (congestive) heart failure: Secondary | ICD-10-CM | POA: Diagnosis not present

## 2015-09-28 MED ORDER — FUROSEMIDE 20 MG PO TABS
10.0000 mg | ORAL_TABLET | Freq: Every day | ORAL | Status: DC
  Administered 2015-09-28 – 2015-09-29 (×2): 10 mg via ORAL
  Filled 2015-09-28 (×2): qty 1

## 2015-09-28 MED ORDER — LEVOTHYROXINE SODIUM 100 MCG PO TABS: 100.0000 ug | ORAL_TABLET | Freq: Every day | ORAL | Status: DC

## 2015-09-28 NOTE — Progress Notes (Signed)
[name] PROGRESS NOTE    Progress Note   Barry Wright [id number] DOB: 05-14-1920 DOA: 09/26/2015 PCP: Mathews Argyle, MD   Brief Narrative:   Barry Wright is an 81 y.o. male past medical history of CHF, chronic before meals stage III who is brought to the ED because of increased somnolence and lethargy.  Assessment/Plan:   Acute encephalopathy: His encephalopathy has resolved today, he is awake alert oriented 3. Appreciate neurology's assistance EEG showed mild encephalopathy due to slow PDR, which is unlikely to be of clinical significance. No sign of seizures. B-12 700 RBC folate greater than 80. MRI of the brain show no acute strokes, has remained afebrile and no leukocytosis, his UDS was negative UA showed no signs of infection. Discussed with neurology to discharge patient today. PT consult  Chronic kidney disease (CKD), stage III (moderate): Cr. has remained at baseline no changes made.  Polymyalgia rheumatica ([place]): Continue MTX and folic acid.  Coronary artery disease Continue aspirin and Plavix.  Essential hypertension, benign Has remained stable continue current meds.  Hypothyroidism Continue Synthroid.  Chronic diastolic heart failure (HCC) Euvolemic.  Chronic back pain D/C Dilaudid.  DVT Prophylaxis - Lovenox ordered.  Family Communication: spoke with over the phone. Disposition Plan: Home hopefully today Code Status:     Code Status Orders        Start     Ordered   09/27/15 0029  Full code   Continuous     09/27/15 0028        IV Access:    Peripheral IV   Procedures and diagnostic studies:   Dg Chest 2 View  09/26/2015  CLINICAL DATA:  Patient presents from home via EMS for AMS. EMS reports lethargy, recently treated for UTI. Hx CAD. Hx CHF. Hx MI. Hx heart surgery. Former smoker. Unable to obtain further hx from pt- pt falling asleep during exam. EXAM: CHEST - 2 VIEW COMPARISON:  05/08/2015 FINDINGS:  Low lung volumes. Patchy atelectasis or infiltrate in the basilar segments of both lower lobes, left greater than right, new since prior exam. Heart size upper limits normal for technique. Previous CABG. Atheromatous aortic arch. No pneumothorax. No effusion. IMPRESSION: 1. Low volumes with bibasilar atelectasis/infiltrate, left greater than right, new since prior exam. Electronically Signed   By: Barry Wright M.D.   On: 09/26/2015 20:03   Ct Head Wo Contrast  09/26/2015  CLINICAL DATA:  Altered mental status, lethargy. Recent treatment for urinary tract infection. History of polymyalgia rheumatica, hypercholesterolemia, stroke. EXAM: CT HEAD WITHOUT CONTRAST TECHNIQUE: Contiguous axial images were obtained from the base of the skull through the vertex without intravenous contrast. COMPARISON:  MRI head October 31, 2014 FINDINGS: Moderate to severe ventriculomegaly on the basis of global parenchymal brain volume loss. No intraparenchymal hemorrhage, mass effect nor midline shift. Patchy supratentorial white matter hypodensities are within normal range for patient's age and though non-specific suggest sequelae of chronic small vessel ischemic disease. No acute large vascular territory infarcts. Old LEFT basal ganglia lacunar infarct. No abnormal extra-axial fluid collections. Basal cisterns are patent. Moderate calcific atherosclerosis of the carotid siphons. No skull fracture. The included ocular globes and orbital contents are non-suspicious. Status post bilateral ocular lens implants. The mastoid aircells and included paranasal sinuses are well-aerated. IMPRESSION: No acute intracranial process. Stable changes including moderate-to-severe global brain atrophy and moderate to severe chronic small vessel ischemic disease. Electronically Signed   By: Barry Wright M.D.   On: 09/26/2015 21:09   Mr  Brain W Wo Contrast  09/27/2015  CLINICAL DATA:  81 year old male with decreased mental status, confusion.  Initial encounter. EXAM: MRI HEAD WITHOUT AND WITH CONTRAST TECHNIQUE: Multiplanar, multiecho pulse sequences of the brain and surrounding structures were obtained without and with intravenous contrast. CONTRAST:  36mL MULTIHANCE GADOBENATE DIMEGLUMINE 529 MG/ML IV SOLN COMPARISON:  Head CT without contrast 09/26/2015. Brain MRI 10/31/2014 and earlier. FINDINGS: Major intracranial vascular flow voids are stable. Generalized intracranial artery dolichoectasia again noted. Stable cerebral volume. No restricted diffusion to suggest acute infarction. No midline shift, mass effect, evidence of mass lesion, ventriculomegaly, extra-axial collection or acute intracranial hemorrhage. Cervicomedullary junction and pituitary are within normal limits. Stable gray and white matter signal throughout the brain. No abnormal enhancement identified. No dural thickening. Negative visualized cervical spine. Normal bone marrow signal. Mastoids remain clear. Mild paranasal sinus mucosal thickening has increased. Negative orbit and scalp soft tissues. IMPRESSION: No acute intracranial abnormality. Stable MRI appearance of the brain since 2015. Electronically Signed   By: Barry Wright M.D.   On: 09/27/2015 14:06     Medical Consultants:    None.  Anti-Infectives:   Anti-infectives    None      Subjective:    Barry Wright No complains want to go home.  Objective:    Filed Vitals:   09/27/15 1500 09/27/15 2100 09/28/15 0600 09/28/15 0826  BP: 128/74 122/44 149/66 160/66  Pulse: 74 72 59 69  Temp:  97.8 F (36.6 C) 97.7 F (36.5 C) 97.6 F (36.4 C)  TempSrc:  Oral Oral Oral  Resp: 16 18 18 17   Height:      Weight:   84.2 kg (185 lb 10 oz)   SpO2:  94% 97% 96%    Intake/Output Summary (Last 24 hours) at 09/28/15 1039 Last data filed at 09/28/15 0800  Gross per 24 hour  Intake 2463.75 ml  Output      0 ml  Net 2463.75 ml   Filed Weights   09/27/15 0028 09/28/15 0600  Weight: 84.2 kg (185 lb 10 oz)  84.2 kg (185 lb 10 oz)    Exam: Gen:  NAD Cardiovascular:  RRR, No M/R/G Chest and lungs:   CTAB Abdomen:  Abdomen soft, NT/ND, + BS Extremities:  No C/E/C   Data Reviewed:    Labs: Basic Metabolic Panel:  Recent Labs Lab 09/26/15 1936 09/27/15 0537  NA 139 143  K 4.1 3.7  CL 103 107  CO2 28 28  GLUCOSE 124* 111*  BUN 25* 24*  CREATININE 1.47* 1.39*  CALCIUM 9.3 9.2   GFR Estimated Creatinine Clearance: 35.4 mL/min (by C-G formula based on Cr of 1.39). Liver Function Tests:  Recent Labs Lab 09/26/15 1936  AST 19  ALT 15*  ALKPHOS 78  BILITOT 0.6  PROT 6.1*  ALBUMIN 3.3*   No results for input(s): LIPASE, AMYLASE in the last 168 hours. No results for input(s): AMMONIA in the last 168 hours. Coagulation profile No results for input(s): INR, PROTIME in the last 168 hours.  CBC:  Recent Labs Lab 09/26/15 1936 09/27/15 0537  WBC 10.2 8.3  NEUTROABS 7.9*  --   HGB 13.9 13.0  HCT 41.6 40.0  MCV 101.2* 102.8*  PLT 203 194   Cardiac Enzymes: No results for input(s): CKTOTAL, CKMB, CKMBINDEX, TROPONINI in the last 168 hours. BNP (last 3 results) No results for input(s): PROBNP in the last 8760 hours. CBG:  Recent Labs Lab 09/26/15 2021  GLUCAP 123*  D-Dimer: No results for input(s): DDIMER in the last 72 hours. Hgb A1c: No results for input(s): HGBA1C in the last 72 hours. Lipid Profile: No results for input(s): CHOL, HDL, LDLCALC, TRIG, CHOLHDL, LDLDIRECT in the last 72 hours. Thyroid function studies: No results for input(s): TSH, T4TOTAL, T3FREE, THYROIDAB in the last 72 hours.  Invalid input(s): FREET3 Anemia work up:  Recent Labs  09/27/15 1613 09/27/15 1630  VITAMINB12 769  --   FOLATE  --  >80.0   Sepsis Labs:  Recent Labs Lab 09/26/15 1936 09/27/15 0537  WBC 10.2 8.3   Microbiology No results found for this or any previous visit (from the past 240 hour(s)).   Medications:   . aspirin EC  81 mg Oral Daily  .  clopidogrel  75 mg Oral Daily  . enoxaparin (LOVENOX) injection  40 mg Subcutaneous QHS  . folic acid  1 mg Oral Daily  . levothyroxine  100 mcg Oral QAC breakfast  . [START ON 10/03/2015] methotrexate  10 mg Oral Q Wed  . sodium chloride  3 mL Intravenous Q12H  . traMADol  50 mg Oral BID   Continuous Infusions: . sodium chloride 75 mL/hr at 09/28/15 0603    Time spent: 15 min     FELIZ Marguarite Arbour  [name] Pager [phone]  *Please refer to [email or web address], password [email or web address] to get updated schedule on who will round on this patient, as hospitalists switch teams weekly. If 7PM-7AM, please contact night-coverage at [email or web address], password [email or web address] for any overnight needs.  09/28/2015, 10:39 AM

## 2015-09-28 NOTE — Evaluation (Signed)
Physical Therapy Evaluation Patient Details Name: Barry Wright MRN: [id number] DOB: Oct 16, 1920 Today's Date: 09/28/2015   History of Present Illness  81 y.o. male past medical history of CHF, chronic kidney disease stage III, CVA, MI, CABG, polymyalgia rheumatica, LBBB, chronic back pain who is brought to the ED because of increased somnolence and lethargy.  Clinical Impression  Pt admitted with above diagnosis. Pt currently with functional limitations due to the deficits listed below (see PT Problem List).  Pt will benefit from skilled PT to increase their independence and safety with mobility to allow discharge to the venue listed below.   Pt reports being nonambulatory at baseline.  Pt typically remains in lift chair recliner at home (sleeps there).  Pt reports he is able to transfer to w/c using sliding board so performed supine to sit and then lateral/scoot transfers to/from w/c which pt required mod assist.  Pt and spouse reports caregivers at certain times a day.  Spouse also reports pt was to start at a new outpatient PT clinic which has parallel bars on Monday (likely to progress to ambulating?).  Recommend ST-SNF at this time or pt with increased assist/24/7 caregivers at home.  Pt reports he would like to d/c home today and did not appear to have good insight into assist/transfers/mobility concerns at home.     Follow Up Recommendations SNF;Supervision/Assistance - 24 hour    Equipment Recommendations  None recommended by PT    Recommendations for Other Services       Precautions / Restrictions Precautions Precautions: Fall      Mobility  Bed Mobility Overal bed mobility: Needs Assistance Bed Mobility: Supine to Sit;Sit to Supine     Supine to sit: Max assist Sit to supine: Max assist   General bed mobility comments: verbal cues for self assist, increased time and effort, utilized bed pad for assist  Transfers Overall transfer level: Needs assistance   Transfers:  Lateral/Scoot Transfers          Lateral/Scoot Transfers: With slide board;Mod assist General transfer comment: verbal cues for head/hips technique, pt tends to lean onto transferring side making scooting more difficult, one of pt's caregivers present and she states she can provide assist level however pt has other caregivers  Ambulation/Gait Ambulation/Gait assistance:  (nonambulatory)              Stairs            Wheelchair Mobility    Modified Rankin (Stroke Patients Only)       Balance Overall balance assessment: Needs assistance Sitting-balance support: Bilateral upper extremity supported;Feet supported Sitting balance-Leahy Scale: Poor                                       Pertinent Vitals/Pain Pain Assessment: No/denies pain    Home Living Family/patient expects to be discharged to:: Private residence Living Arrangements: Spouse/significant other;Other (Comment) Available Help at Discharge: Personal care attendant (no PCA from 1-5pm  (has one 5-9pm)) Type of Home: House Home Access: Ramped entrance     Home Layout: Able to live on main level with bedroom/bathroom Home Equipment: Walker - 2 wheels;Bedside commode;Shower seat;Grab bars - toilet;Grab bars - tub/shower;Wheelchair - manual (lift chair)      Prior Function Level of Independence: Needs assistance   Gait / Transfers Assistance Needed: non ambulatory. Varying level of performance at baseline per aide: can sometimes pivot from  lift chair to WC, sometimes he cannot           Hand Dominance        Extremity/Trunk Assessment   Upper Extremity Assessment: Generalized weakness           Lower Extremity Assessment: Generalized weakness         Communication   Communication: HOH  Cognition Arousal/Alertness: Awake/alert Behavior During Therapy: WFL for tasks assessed/performed                        General Comments      Exercises         Assessment/Plan    PT Assessment Patient needs continued PT services  PT Diagnosis Generalized weakness   PT Problem List Decreased strength;Decreased activity tolerance;Decreased mobility;Decreased balance;Decreased knowledge of use of DME  PT Treatment Interventions DME instruction;Functional mobility training;Patient/family education;Therapeutic activities;Therapeutic exercise;Wheelchair mobility training   PT Goals (Current goals can be found in the Care Plan section) Acute Rehab PT Goals Patient Stated Goal: wishes to go home today PT Goal Formulation: With patient/family Time For Goal Achievement: 10/12/15 Potential to Achieve Goals: Fair    Frequency Min 2X/week   Barriers to discharge        Co-evaluation               End of Session Equipment Utilized During Treatment: Gait belt Activity Tolerance: Patient limited by fatigue Patient left: in bed;with call bell/phone within reach;with bed alarm set;with family/visitor present      Functional Assessment Tool Used: clinical judgement Functional Limitation: Mobility: Walking and moving around Mobility: Walking and Moving Around Current Status [phone]): At least 80 percent but less than 100 percent impaired, limited or restricted Mobility: Walking and Moving Around Goal Status [phone]): At least 40 percent but less than 60 percent impaired, limited or restricted    Time: 1155-1228 PT Time Calculation (min) (ACUTE ONLY): 33 min   Charges:   PT Evaluation $Initial PT Evaluation Tier I: 1 Procedure     PT G Codes:   PT G-Codes **NOT FOR INPATIENT CLASS** Functional Assessment Tool Used: clinical judgement Functional Limitation: Mobility: Walking and moving around Mobility: Walking and Moving Around Current Status [id number]): At least 80 percent but less than 100 percent impaired, limited or restricted Mobility: Walking and Moving Around Goal Status [phone]): At least 40 percent but less than 60 percent impaired,  limited or restricted    Carrell Palmatier,KATHrine E 09/28/2015, 1:24 PM Carmelia Bake, PT, DPT 09/28/2015 Pager: [phone]

## 2015-09-29 DIAGNOSIS — M549 Dorsalgia, unspecified: Secondary | ICD-10-CM

## 2015-09-29 DIAGNOSIS — G8929 Other chronic pain: Secondary | ICD-10-CM

## 2015-09-29 DIAGNOSIS — N183 Chronic kidney disease, stage 3 (moderate): Secondary | ICD-10-CM | POA: Diagnosis not present

## 2015-09-29 DIAGNOSIS — I5032 Chronic diastolic (congestive) heart failure: Secondary | ICD-10-CM

## 2015-09-29 DIAGNOSIS — L8990 Pressure ulcer of unspecified site, unspecified stage: Secondary | ICD-10-CM | POA: Insufficient documentation

## 2015-09-29 DIAGNOSIS — G9340 Encephalopathy, unspecified: Secondary | ICD-10-CM

## 2015-09-29 MED ORDER — TRAMADOL HCL 50 MG PO TABS: ORAL_TABLET | ORAL | Status: DC

## 2015-09-29 MED ORDER — TRAMADOL HCL 50 MG PO TABS: 50.0000 mg | ORAL_TABLET | Freq: Two times a day (BID) | ORAL | Status: DC | PRN

## 2015-09-29 NOTE — Care Management Note (Signed)
Case Management Note  Patient Details  Name: Barry Wright MRN: [id number] Date of Birth: Nov 13, 1919  Subjective/Objective:                  CHF  Action/Plan: CM spoke with the patient, his wife and PCA at the bedside. Patient's wife became upset when CM explained the reason for the consult is to set-up HHC. She yelled while speaking with CM. States they have an appointment for outpatient PT on Tuesday at 10:00. Explained the PT recommendation was for SNF placement at discharged but since the patient declined, home health PT has been ordered. She states her daughter is POA but declines to consult with her daughter stating she does not talk to them. Patient states he is attending OP PT because he wants to walk again. He has declined home health services.  Expected Discharge Date:   09/29/15             Expected Discharge Plan:  [place]  In-House Referral:     Discharge planning Services  CM Consult  Post Acute Care Choice:  Home Health Choice offered to:  Patient  DME Arranged:    DME Agency:     HH Arranged:  Patient Refused [place] Agency:     Status of Service:  Completed, signed off  Medicare Important Message Given:    Date Medicare IM Given:    Medicare IM give by:    Date Additional Medicare IM Given:    Additional Medicare Important Message give by:     If discussed at [place] of Stay Meetings, dates discussed:    Additional Comments:  Apolonio Schneiders, RN 09/29/2015, 12:34 PM

## 2015-09-29 NOTE — Discharge Summary (Signed)
Physician Discharge Summary  Barry Wright [id number] DOB: [birth year] DOA: 09/26/2015  PCP: Mathews Argyle, MD  Admit date: 09/26/2015 Discharge date: 09/29/2015  Time spent: 35 minutes  Recommendations for Outpatient Follow-up:  Needs follow up with PCP, need CBC, bmet   Discharge Diagnoses:    Acute encephalopathy   Chronic kidney disease (CKD), stage III (moderate)   Polymyalgia rheumatica (HCC)   Coronary artery disease   Essential hypertension, benign   Hypothyroidism   Chronic diastolic heart failure (HCC)   Chronic back pain   Altered mental status   Pressure ulcer   Discharge Condition: stable  Diet recommendation: heart healthy  Filed Weights   09/27/15 0028 09/28/15 0600 09/29/15 0440  Weight: 84.2 kg (185 lb 10 oz) 84.2 kg (185 lb 10 oz) 87.4 kg (192 lb 10.9 oz)    History of present illness:  Barry Wright is a 81 y.o. male with a history of CAD, CHF, PMR, Stage III CKD who was brought to the ED due to increased Somnolence and lethargy. He was observed in the ED to have alertness for a few minutes and then to have decreased responsiveness. He was administered IV Narcan x 1 without change. A Ct scan of the head was performed and was negative for acute findings. Neurology was consulted, and he was referred for further evaluation.   Hospital Course:  Acute encephalopathy: His encephalopathy has resolved today, he is awake alert oriented 3. Appreciate neurology's assistance EEG showed mild encephalopathy due to slow PDR, which is unlikely to be of clinical significance. No sign of seizures. B-12 700 RBC folate greater than 80. MRI of the brain show no acute strokes, has remained afebrile and no leukocytosis, his UDS was negative UA showed no signs of infection. Hold gabapentin at discharge. Tramadol change to BID PRN PT consulted, recommends SNF. Wife and patient wants to be discharge to home. They decline HH  Chronic kidney disease  (CKD), stage III (moderate): Cr. has remained at baseline no changes made.  Polymyalgia rheumatica ([place]): Continue MTX and folic acid.  Coronary artery disease Continue aspirin and Plavix.  Essential hypertension, benign Has remained stable continue current meds.  Hypothyroidism Continue Synthroid.  Chronic diastolic heart failure (HCC) Euvolemic.  Chronic back pain D/C Dilaudid.  DVT Prophylaxis - Lovenox ordered.  Procedures:  EEG; Slow PDR  Consultations:  Neurology   Discharge Exam: Filed Vitals:   09/29/15 0440  BP: 138/63  Pulse: 69  Temp: 98.1 F (36.7 C)  Resp: 18    General: Alert in no distress Cardiovascular: S 1, S 2 RRR Respiratory: CTA  Discharge Instructions   Discharge Instructions    Diet - low sodium heart healthy    Complete by:  As directed      Increase activity slowly    Complete by:  As directed           Current Discharge Medication List    CONTINUE these medications which have CHANGED   Details  traMADol (ULTRAM) 50 MG tablet Take one tablet by mouth twice daily for pains PRN Qty: 60 tablet, Refills: 0      CONTINUE these medications which have NOT CHANGED   Details  acetaminophen (TYLENOL) 500 MG tablet Take 1,000 mg by mouth 2 (two) times daily.     aspirin 81 MG tablet Take 81 mg by mouth daily.    calcium carbonate (OS-CAL - DOSED IN MG OF ELEMENTAL CALCIUM) 1250 MG tablet Take 1 tablet by mouth daily with  breakfast.    clopidogrel (PLAVIX) 75 MG tablet Take 75 mg by mouth daily.      folic acid (FOLVITE) 1 MG tablet Take 1 mg by mouth daily.    furosemide (LASIX) 20 MG tablet Take 0.5 tablets (10 mg total) by mouth daily. Qty: 30 tablet, Refills: 1    methotrexate (RHEUMATREX) 2.5 MG tablet Take 10 mg by mouth every Wednesday.    Multiple Vitamins-Minerals (MULTIVITAMINS THER. W/MINERALS) TABS Take 1 tablet by mouth daily.      SYNTHROID 100 MCG tablet Take 100 mcg by mouth daily before breakfast.      nitroGLYCERIN (NITROSTAT) 0.4 MG SL tablet Place 0.4 mg under the tongue every 5 (five) minutes as needed for chest pain.    polyethylene glycol (MIRALAX / GLYCOLAX) packet Take 17 g by mouth daily as needed for mild constipation.       STOP taking these medications     gabapentin (NEURONTIN) 100 MG capsule        Allergies  Allergen Reactions  . Penicillins Rash   Follow-up Information    Follow up with Mathews Argyle, MD In 1 week.   Specialty:  Internal Medicine   Contact information:   [address] [place] [address] [phone]        The results of significant diagnostics from this hospitalization (including imaging, microbiology, ancillary and laboratory) are listed below for reference.    Significant Diagnostic Studies: Dg Chest 2 View  09/26/2015  CLINICAL DATA:  Patient presents from home via EMS for AMS. EMS reports lethargy, recently treated for UTI. Hx CAD. Hx CHF. Hx MI. Hx heart surgery. Former smoker. Unable to obtain further hx from pt- pt falling asleep during exam. EXAM: CHEST - 2 VIEW COMPARISON:  05/08/2015 FINDINGS: Low lung volumes. Patchy atelectasis or infiltrate in the basilar segments of both lower lobes, left greater than right, new since prior exam. Heart size upper limits normal for technique. Previous CABG. Atheromatous aortic arch. No pneumothorax. No effusion. IMPRESSION: 1. Low volumes with bibasilar atelectasis/infiltrate, left greater than right, new since prior exam. Electronically Signed   By: Lucrezia Europe M.D.   On: 09/26/2015 20:03   Ct Head Wo Contrast  09/26/2015  CLINICAL DATA:  Altered mental status, lethargy. Recent treatment for urinary tract infection. History of polymyalgia rheumatica, hypercholesterolemia, stroke. EXAM: CT HEAD WITHOUT CONTRAST TECHNIQUE: Contiguous axial images were obtained from the base of the skull through the vertex without intravenous contrast. COMPARISON:  MRI head October 31, 2014  FINDINGS: Moderate to severe ventriculomegaly on the basis of global parenchymal brain volume loss. No intraparenchymal hemorrhage, mass effect nor midline shift. Patchy supratentorial white matter hypodensities are within normal range for patient's age and though non-specific suggest sequelae of chronic small vessel ischemic disease. No acute large vascular territory infarcts. Old LEFT basal ganglia lacunar infarct. No abnormal extra-axial fluid collections. Basal cisterns are patent. Moderate calcific atherosclerosis of the carotid siphons. No skull fracture. The included ocular globes and orbital contents are non-suspicious. Status post bilateral ocular lens implants. The mastoid aircells and included paranasal sinuses are well-aerated. IMPRESSION: No acute intracranial process. Stable changes including moderate-to-severe global brain atrophy and moderate to severe chronic small vessel ischemic disease. Electronically Signed   By: Elon Alas M.D.   On: 09/26/2015 21:09   Mr Kizzie Fantasia Contrast  09/27/2015  CLINICAL DATA:  81 year old male with decreased mental status, confusion. Initial encounter. EXAM: MRI HEAD WITHOUT AND WITH CONTRAST TECHNIQUE:  Multiplanar, multiecho pulse sequences of the brain and surrounding structures were obtained without and with intravenous contrast. CONTRAST:  38mL MULTIHANCE GADOBENATE DIMEGLUMINE 529 MG/ML IV SOLN COMPARISON:  Head CT without contrast 09/26/2015. Brain MRI 10/31/2014 and earlier. FINDINGS: Major intracranial vascular flow voids are stable. Generalized intracranial artery dolichoectasia again noted. Stable cerebral volume. No restricted diffusion to suggest acute infarction. No midline shift, mass effect, evidence of mass lesion, ventriculomegaly, extra-axial collection or acute intracranial hemorrhage. Cervicomedullary junction and pituitary are within normal limits. Stable gray and white matter signal throughout the brain. No abnormal enhancement  identified. No dural thickening. Negative visualized cervical spine. Normal bone marrow signal. Mastoids remain clear. Mild paranasal sinus mucosal thickening has increased. Negative orbit and scalp soft tissues. IMPRESSION: No acute intracranial abnormality. Stable MRI appearance of the brain since 2015. Electronically Signed   By: Genevie Ann M.D.   On: 09/27/2015 14:06    Microbiology: No results found for this or any previous visit (from the past 240 hour(s)).   Labs: Basic Metabolic Panel:  Recent Labs Lab 09/26/15 1936 09/27/15 0537  NA 139 143  K 4.1 3.7  CL 103 107  CO2 28 28  GLUCOSE 124* 111*  BUN 25* 24*  CREATININE 1.47* 1.39*  CALCIUM 9.3 9.2   Liver Function Tests:  Recent Labs Lab 09/26/15 1936  AST 19  ALT 15*  ALKPHOS 78  BILITOT 0.6  PROT 6.1*  ALBUMIN 3.3*   No results for input(s): LIPASE, AMYLASE in the last 168 hours. No results for input(s): AMMONIA in the last 168 hours. CBC:  Recent Labs Lab 09/26/15 1936 09/27/15 0537  WBC 10.2 8.3  NEUTROABS 7.9*  --   HGB 13.9 13.0  HCT 41.6 40.0  MCV 101.2* 102.8*  PLT 203 194   Cardiac Enzymes: No results for input(s): CKTOTAL, CKMB, CKMBINDEX, TROPONINI in the last 168 hours. BNP: BNP (last 3 results) No results for input(s): BNP in the last 8760 hours.  ProBNP (last 3 results) No results for input(s): PROBNP in the last 8760 hours.  CBG:  Recent Labs Lab 09/26/15 2021  GLUCAP 123*       Signed:  Rafael Salway A  [name] 09/29/2015, 11:41 AM

## 2015-09-29 NOTE — Progress Notes (Signed)
Shift progressed uneventfully. Patient slept well overnight and denied any pain. Initial neurologic assessment was remarkable for relative strength difference between the upper and lower extremities, UE > LE. Upper extremity motor strength noted to be full and symmetric, graded as 5/5. Patient reported comparative diminution of strength in both lower extremities, graded as 4+/5. Periodic urinary incontinence evident, requiring condom catheter placement for drainage containment. Patient was turned and repositioned routinely on his lateral sides, with pillow support behind the back and in between his knees. Bilateral calves elevated longitudinally over a pillow for offloading of heel pressure.

## 2015-10-01 ENCOUNTER — Encounter: Payer: Medicare Other | Admitting: Physical Therapy

## 2015-10-09 ENCOUNTER — Ambulatory Visit: Payer: Medicare Other

## 2015-10-11 ENCOUNTER — Ambulatory Visit: Payer: Medicare Other | Admitting: Physical Therapy

## 2015-10-11 ENCOUNTER — Ambulatory Visit: Payer: Medicare Other | Attending: Geriatric Medicine

## 2015-10-11 DIAGNOSIS — R262 Difficulty in walking, not elsewhere classified: Secondary | ICD-10-CM | POA: Insufficient documentation

## 2015-10-11 DIAGNOSIS — R29898 Other symptoms and signs involving the musculoskeletal system: Secondary | ICD-10-CM | POA: Diagnosis not present

## 2015-10-11 DIAGNOSIS — Z7409 Other reduced mobility: Secondary | ICD-10-CM

## 2015-10-11 DIAGNOSIS — R269 Unspecified abnormalities of gait and mobility: Secondary | ICD-10-CM

## 2015-10-11 NOTE — Therapy (Signed)
[place], [place], [address] Phone: [phone]   Fax:  [phone]  Physical Therapy Evaluation  Patient Details  Name: Barry Wright MRN: [id number] Date of Birth: May 30, 1920 Referring Provider: Lajean Manes, MD  Encounter Date: 10/11/2015      PT End of Session - 10/11/15 1324    Visit Number 1   Number of Visits 16   Date for PT Re-Evaluation 12/06/15   Authorization Type KX modifier at visit 2   PT Start Time [id number]   PT Stop Time 1240   PT Time Calculation (min) 55 min   Equipment Utilized During Treatment Gait belt   Activity Tolerance Patient tolerated treatment well   Behavior During Therapy [facility] for tasks assessed/performed      Past Medical History  Diagnosis Date  . Chronic back pain   . Hypercholesteremia   . Hypothyroidism   . CAD (coronary artery disease)   . Chronic kidney disease (CKD), stage III (moderate)   . Polymyalgia rheumatica ([place])   . Left bundle branch block   . Hx of CABG   . History of nephrectomy   . Internal carotid artery stenosis   . Myocardial infarction ([place])   . CHF (congestive heart failure) ([place])   . Stroke [facility])     Past Surgical History  Procedure Laterality Date  . Coronary artery bypass graft    . Cholecystectomy    . Kidney surgery      kidney removed    There were no vitals filed for this visit.  Visit Diagnosis:  Abnormality of gait  Difficulty walking  Leg weakness, bilateral  Impaired mobility and activities of daily living      Subjective Assessment - 10/11/15 1203    Subjective Mr Debruyn rpeorts he is unable to walk and wants to return to walking. He has not walked without device for a year and has not walked in past 4 months. He reports doing his HEP on a regular basis   Patient is accompained by: Family member  spouse and care giver   Limitations Walking;Standing   How long can you sit comfortably? As needed   How long can  you stand comfortably? Unable without max support   How long can you walk comfortably? Unable   Diagnostic tests MRI, CT scan   Patient Stated Goals He would like to walk again   Currently in Pain? No/denies  due to medication. He did report some back pain with lying flat on mat and with getting on and off mat to sitting            [facility] PT Assessment - 10/11/15 1155    Assessment   Medical Diagnosis spinal stenosis with gait disorder   Referring Provider Lajean Manes, MD   Hand Dominance Right   Next MD Visit in a week or 2   Prior Therapy HHPT  for six weeks at end of summer, then 13 visits over 2 months at [facility]    Precautions   Precautions Fall   Restrictions   Weight Bearing Restrictions No   Balance Screen   Has the patient fallen in the past 6 months No   Has the patient had a decrease in activity level because of a fear of falling?  Yes   Is the patient reluctant to leave their home because of a fear of falling?  No  with wheel chair.    Home Environment   Living Environment  Private residence   Living Arrangements Spouse/significant other;Other (Comment)  They have caregivers daily for 8 hours   Type of [place] entrance   Prior Function   Level of Independence Needs assistance with ADLs   Toileting Moderate   Dressing Moderate   Vocation Retired   Comments 4 months ago he walked with walker, without help,  over a year walking with walker but did not use cane prior to use of walker.    Cognition   Overall Cognitive Status Within Functional Limits for tasks assessed   Strength   Overall Strength Comments   TEsted seated, UE WFL for walking with walker.    Right Hip Flexion 4-/5   Right Hip Extension 2/5   Right Hip ABduction 2/5   Right Hip ADduction 4/5   Left Hip Flexion 4/5   Left Hip Extension 2+/5   Left Hip ABduction 2/5   Left Hip ADduction 4/5   Right/Left Knee Right;Left   Right Knee Flexion 4-/5   Right Knee  Extension 3/5   Left Knee Flexion 4/5   Left Knee Extension 4/5   Right/Left Ankle Right;Left   Right Ankle Dorsiflexion 4-/5   Left Ankle Dorsiflexion 4/5   Transfers   Comments Transfered                    [facility] Adult PT Treatment/Exercise - 10/11/15 1155    Ambulation/Gait   Ambulation Distance (Feet) 12 Feet   Assistive device Parallel bars   Gait Pattern Step-through pattern   Ambulation Surface Level   Gait velocity slow  3x both knees flexed and he needed VC to keep legs straight                PT Education - 10/11/15 1323    Education provided Yes   Education Details POC, Porgnosis and risk of fall   Person(s) Educated Patient;Spouse   Methods Explanation   Comprehension Verbalized understanding          PT Short Term Goals - 10/11/15 1333    PT SHORT TERM GOAL #1   Title pt will be independent with intial HEP after review   Time 4   Period Weeks   Status New   PT SHORT TERM GOAL #2   Title He will be able to walk in bars 100 feet or more with turning with only contact guard.   Time 4   Period Weeks   Status New   PT SHORT TERM GOAL #3   Title He will be able to roll on mat RT and LT  without asssitance   Time 4   Period Weeks   Status New   PT SHORT TERM GOAL #4   Title He will be able to go sit to stand with min assist to walker   Time 4   Period Weeks   Status New           PT Long Term Goals - 10/11/15 1335    PT LONG TERM GOAL #1   Title pt will perform sit to stand without assistance at his WC to walker   Time 8   Period Weeks   Status New   PT LONG TERM GOAL #2   Title pt will ambulate x 30 ft with RW CGA   Time 8   Period Weeks   Status New   PT LONG TERM GOAL #3   Title pt will tolerate standing activities  in the clinic x 5 minutes to demonstrate improved endurance   Period Weeks   Status New   PT LONG TERM GOAL #4   Title patient able to walk 50 feet with rolling walker and c.g with aide from W/C   Time  8   Period Weeks   Status New               Plan - October 12, 2015 1325    Clinical Impression Statement Mr Vaal presents with significant weakness in both LE and limitations in transfers, bed mobility, and is not able to walk and is only able to stand  with max assist from chair.  He was able to walk in parallell bars with min assist and verbal cues to extend knees and he may have collapsed without verbal cues.   His likely hoood tha he will walk safely is very low. He may be able to walk with asssit in home but agin his fall risk will remain high if his helper is not  attentive  and strong enough if his legs give. He deserves a trial as we can train him with significant support in bars and we wil see if he can safely progress from the bars to the walker   Rehab Potential Fair  and we will assess in 4 weeks and decide on continuing at that time   Clinical Impairments Affecting Rehab Potential Pt with lumbar stenosis and deconditioning.    PT Frequency 2x / week   PT Duration 8 weeks   PT Treatment/Interventions Gait training;Functional mobility training;Therapeutic exercise;Patient/family education;Passive range of motion   PT Next Visit Plan Continue with ambulation, strength B LE, may try standing exercises, transfers and bed mobility   PT Home Exercise Plan Review HEP   Recommended Other Services None   Consulted and Agree with Plan of Care Patient          G-Codes - 10/12/15 1338    Functional Assessment Tool Used clinical judgement   Functional Limitation Mobility: Walking and moving around   Mobility: Walking and Moving Around Current Status [phone]) At least 80 percent but less than 100 percent impaired, limited or restricted   Mobility: Walking and Moving Around Goal Status [phone]) At least 60 percent but less than 80 percent impaired, limited or restricted       Problem List Patient Active Problem List   Diagnosis Date Noted  . Pressure ulcer 09/29/2015  . Altered mental  status   . Acute encephalopathy 09/26/2015  . Chronic back pain 09/26/2015  . Generalized weakness 04/13/2015  . Lobar pneumonia due to unspecified organism 04/13/2015  . Prolonged QT interval   . Weakness 04/11/2015  . CAP (community acquired pneumonia) 01/25/2015  . Dehydration 01/25/2015  . Chronic diastolic heart failure ([place]) 06/19/2014  . Essential hypertension, benign 03/08/2014  . Hypothyroidism 03/08/2014  . Protein-calorie malnutrition, moderate ([place]) 03/08/2014  . Constipation 03/08/2014  . Physical deconditioning 03/08/2014  . Hypokalemia 02/27/2014  . Abnormal serum protein electrophoresis 01/07/2013  . Chronic kidney disease (CKD), stage III (moderate) 01/03/2013  . Polymyalgia rheumatica ([place]) 01/03/2013  . Osteopenia 01/03/2013  . Carotid artery disease ([place]) 01/03/2013  . Spinal stenosis of lumbar region 01/03/2013  . Coronary artery disease 01/03/2013  . Old MI (myocardial infarction) 01/03/2013  . Unspecified transient cerebral ischemia 11/18/2011    [name] PT 10/12/2015, 1:42 PM  [facility] [address] [place], [place], [address] Phone: [phone]   Fax:  [phone]  Name: KEINAN MYREN MRN: [id number] Date of Birth: [birth year]

## 2015-10-11 NOTE — Patient Instructions (Signed)
Mr Bech was asked to bring in his HEP for assessment.

## 2015-10-23 ENCOUNTER — Ambulatory Visit: Payer: Medicare Other | Admitting: Physical Therapy

## 2015-10-23 DIAGNOSIS — Z789 Other specified health status: Secondary | ICD-10-CM

## 2015-10-23 DIAGNOSIS — R29898 Other symptoms and signs involving the musculoskeletal system: Secondary | ICD-10-CM | POA: Diagnosis not present

## 2015-10-23 DIAGNOSIS — R262 Difficulty in walking, not elsewhere classified: Secondary | ICD-10-CM | POA: Diagnosis not present

## 2015-10-23 DIAGNOSIS — R269 Unspecified abnormalities of gait and mobility: Secondary | ICD-10-CM

## 2015-10-23 DIAGNOSIS — Z7409 Other reduced mobility: Secondary | ICD-10-CM

## 2015-10-23 NOTE — Therapy (Signed)
[place], [place], [address] Phone: [phone]   Fax:  [phone]  Physical Therapy Treatment  Patient Details  Name: Barry Wright MRN: [id number] Date of Birth: [birth year] Referring Provider: Lajean Manes, MD  Encounter Date: 10/23/2015      PT End of Session - 10/23/15 1241    Visit Number 2   Number of Visits 16   Date for PT Re-Evaluation 12/06/15   Authorization Type KX modifier   PT Start Time [id number]   PT Stop Time 1229   PT Time Calculation (min) 44 min   Equipment Utilized During Treatment Gait belt   Activity Tolerance Patient tolerated treatment well   Behavior During Therapy [facility] for tasks assessed/performed      Past Medical History  Diagnosis Date  . Chronic back pain   . Hypercholesteremia   . Hypothyroidism   . CAD (coronary artery disease)   . Chronic kidney disease (CKD), stage III (moderate)   . Polymyalgia rheumatica ([place])   . Left bundle branch block   . Hx of CABG   . History of nephrectomy   . Internal carotid artery stenosis   . Myocardial infarction ([place])   . CHF (congestive heart failure) ([place])   . Stroke [facility])     Past Surgical History  Procedure Laterality Date  . Coronary artery bypass graft    . Cholecystectomy    . Kidney surgery      kidney removed    There were no vitals filed for this visit.  Visit Diagnosis:  Abnormality of gait  Difficulty walking  Impaired mobility and activities of daily living  Leg weakness, bilateral      Subjective Assessment - 10/23/15 1149    Subjective Would like to be able to walk and stand with less help. Using walker to help with standing but not able to try walking safely yet.    Currently in Pain? No/denies                         [facility] Adult PT Treatment/Exercise - 10/23/15 0001    Transfers   Transfers Sit to Stand   Sit to Stand 4: Min guard   Comments cues for hand placement,  transfers from w/c in parallel bars   Ambulation/Gait   Ambulation/Gait Yes   Ambulation Distance (Feet) 60 Feet  12 feet X5   Assistive device Parallel bars   Gait Pattern Step-through pattern   Ambulation Surface Level   Gait velocity decreased   Gait Comments decreased foot clearance with swing phase bilateral, cues for posture throughout session, 1 episode of lt knee buckling with independent recovery   Lumbar Exercises: Standing   Other Standing Lumbar Exercises static standing in parallel bars with focus on posture, 1X 60 seconds, 1 X90 seconds.     Lumbar Exercises: Seated   Other Seated Lumbar Exercises seated march [id number]                PT Education - 10/23/15 1241    Education provided Yes   Education Details posture, PT progression   Person(s) Educated Patient;Spouse   Methods Explanation;Demonstration;Tactile cues;Verbal cues   Comprehension Verbalized understanding;Returned demonstration          PT Short Term Goals - 10/11/15 1333    PT SHORT TERM GOAL #1   Title pt will be independent with intial HEP after review   Time 4   Period Weeks  Status New   PT SHORT TERM GOAL #2   Title He will be able to walk in bars 100 feet or more with turning with only contact guard.   Time 4   Period Weeks   Status New   PT SHORT TERM GOAL #3   Title He will be able to roll on mat RT and LT  without asssitance   Time 4   Period Weeks   Status New   PT SHORT TERM GOAL #4   Title He will be able to go sit to stand with min assist to walker   Time 4   Period Weeks   Status New           PT Long Term Goals - 10/11/15 1335    PT LONG TERM GOAL #1   Title pt will perform sit to stand without assistance at his WC to walker   Time 8   Period Weeks   Status New   PT LONG TERM GOAL #2   Title pt will ambulate x 30 ft with RW CGA   Time 8   Period Weeks   Status New   PT LONG TERM GOAL #3   Title pt will tolerate standing activities in the clinic x 5  minutes to demonstrate improved endurance   Period Weeks   Status New   PT LONG TERM GOAL #4   Title patient able to walk 50 feet with rolling walker and c.g with aide from [name]   Time 8   Period Weeks   Status New               Plan - 10/23/15 1243    Clinical Impression Statement Session focusing on gait training and strengthening. Patient able to abmulate a total of 60 feet (12 feet X5) with a seated rest between ambulation. Frequent cues for posture needed througout session. Patient states that he was getting a little tired but no complaints during or after session.    PT Next Visit Plan Continue with ambulation, consider trial of ambulation with rw and close follow with w/c. Standing/posture activities as tolerated.    PT Home Exercise Plan Patient to continue with current HEP at this time.    Consulted and Agree with Plan of Care Patient        Problem List Patient Active Problem List   Diagnosis Date Noted  . Pressure ulcer 09/29/2015  . Altered mental status   . Acute encephalopathy 09/26/2015  . Chronic back pain 09/26/2015  . Generalized weakness 04/13/2015  . Lobar pneumonia due to unspecified organism 04/13/2015  . Prolonged QT interval   . Weakness 04/11/2015  . CAP (community acquired pneumonia) 01/25/2015  . Dehydration 01/25/2015  . Chronic diastolic heart failure ([place]) 06/19/2014  . Essential hypertension, benign 03/08/2014  . Hypothyroidism 03/08/2014  . Protein-calorie malnutrition, moderate ([place]) 03/08/2014  . Constipation 03/08/2014  . Physical deconditioning 03/08/2014  . Hypokalemia 02/27/2014  . Abnormal serum protein electrophoresis 01/07/2013  . Chronic kidney disease (CKD), stage III (moderate) 01/03/2013  . Polymyalgia rheumatica ([place]) 01/03/2013  . Osteopenia 01/03/2013  . Carotid artery disease ([place]) 01/03/2013  . Spinal stenosis of lumbar region 01/03/2013  . Coronary artery disease 01/03/2013  . Old MI (myocardial infarction)  01/03/2013  . Unspecified transient cerebral ischemia 11/18/2011    Linard Millers, PT 10/23/2015, 12:56 PM  [facility] [address] [place], [place], [address] Phone: [phone]   Fax:  [phone]  Name: Barry Wright MRN: [id number] Date of Birth: May 01, 1920

## 2015-10-26 ENCOUNTER — Ambulatory Visit: Payer: Medicare Other | Admitting: Physical Therapy

## 2015-10-30 ENCOUNTER — Ambulatory Visit: Payer: Medicare Other | Admitting: Physical Therapy

## 2015-10-30 DIAGNOSIS — R29898 Other symptoms and signs involving the musculoskeletal system: Secondary | ICD-10-CM | POA: Diagnosis not present

## 2015-10-30 DIAGNOSIS — R262 Difficulty in walking, not elsewhere classified: Secondary | ICD-10-CM

## 2015-10-30 DIAGNOSIS — R269 Unspecified abnormalities of gait and mobility: Secondary | ICD-10-CM

## 2015-10-30 DIAGNOSIS — Z7409 Other reduced mobility: Secondary | ICD-10-CM

## 2015-10-30 NOTE — Therapy (Signed)
[name], [place], [address] Phone: [phone]   Fax:  [phone]  Physical Therapy Treatment  Patient Details  Name: Barry Wright MRN: [id number] Date of Birth: 02-Jul-1920 Referring Provider: Lajean Manes, MD  Encounter Date: 10/30/2015      PT End of Session - 10/30/15 1248    Visit Number 3   Number of Visits 16   Date for PT Re-Evaluation 12/06/15   Authorization Type KX modifier   PT Start Time 1151   PT Stop Time 1234   PT Time Calculation (min) 43 min   Equipment Utilized During Treatment Gait belt   Activity Tolerance Patient tolerated treatment well;Patient limited by fatigue   Behavior During Therapy [facility] for tasks assessed/performed      Past Medical History  Diagnosis Date  . Chronic back pain   . Hypercholesteremia   . Hypothyroidism   . CAD (coronary artery disease)   . Chronic kidney disease (CKD), stage III (moderate)   . Polymyalgia rheumatica ([place])   . Left bundle branch block   . Hx of CABG   . History of nephrectomy   . Internal carotid artery stenosis   . Myocardial infarction ([place])   . CHF (congestive heart failure) ([place])   . Stroke [facility])     Past Surgical History  Procedure Laterality Date  . Coronary artery bypass graft    . Cholecystectomy    . Kidney surgery      kidney removed    There were no vitals filed for this visit.  Visit Diagnosis:  Abnormality of gait  Difficulty walking  Impaired mobility and activities of daily living  Leg weakness, bilateral      Subjective Assessment - 10/30/15 1154    Subjective Feeling alright today. Missed last session since it was cold and just didn't feel up to exercising.    Currently in Pain? No/denies                         [facility] Adult PT Treatment/Exercise - 10/30/15 0001    Transfers   Transfers Sit to Stand   Sit to Stand 4: Min guard;4: Min assist;Other (comment)  increased  assist when using rw   Transfer Cueing scooting and hand position   Comments cues for hand position.    Ambulation/Gait   Ambulation/Gait Yes   Ambulation Distance (Feet) 60 Feet  24 2X12 ft in bars, 36 with rw (3X12 feet)   Assistive device Parallel bars;Rolling walker   Gait Pattern Step-through pattern   Ambulation Surface Level   Gait velocity decreased   Gait Comments starting parallel bars and progressing to rw.    Knee/Hip Exercises: Seated   Sit to Sand 3 sets;with UE support  55min X2, 2.25 min X1, cues posture and sequence                PT Education - 10/30/15 1247    Education provided Yes   Education Details posture, sit/stand hand positions for safety   Person(s) Educated Patient   Methods Explanation;Demonstration;Tactile cues;Verbal cues   Comprehension Verbalized understanding;Returned demonstration;Need further instruction          PT Short Term Goals - 10/11/15 1333    PT SHORT TERM GOAL #1   Title pt will be independent with intial HEP after review   Time 4   Period Weeks   Status New   PT SHORT TERM GOAL #2  Title He will be able to walk in bars 100 feet or more with turning with only contact guard.   Time 4   Period Weeks   Status New   PT SHORT TERM GOAL #3   Title He will be able to roll on mat RT and LT  without asssitance   Time 4   Period Weeks   Status New   PT SHORT TERM GOAL #4   Title He will be able to go sit to stand with min assist to walker   Time 4   Period Weeks   Status New           PT Long Term Goals - 10/11/15 1335    PT LONG TERM GOAL #1   Title pt will perform sit to stand without assistance at his WC to walker   Time 8   Period Weeks   Status New   PT LONG TERM GOAL #2   Title pt will ambulate x 30 ft with RW CGA   Time 8   Period Weeks   Status New   PT LONG TERM GOAL #3   Title pt will tolerate standing activities in the clinic x 5 minutes to demonstrate improved endurance   Period Weeks    Status New   PT LONG TERM GOAL #4   Title patient able to walk 50 feet with rolling walker and c.g with aide from W/C   Time 8   Period Weeks   Status New               Plan - 10/30/15 1249    Clinical Impression Statement Able to progress from parallel bars and start ambulation with rw. Cues needed for sit/stand sequence throughout session. Rest breaks needed throughout session due to fatigue. Overall mobility level increased with ability to progress to rw. Will continue to advance as tolerated.    PT Next Visit Plan Continue with gait focus, rw with increasing distance (w/c assist to follow). Work on Investment banker, operational, standing tolerance.   PT Home Exercise Plan encouraging static standing with assistance at home.    Consulted and Agree with Plan of Care Patient        Problem List Patient Active Problem List   Diagnosis Date Noted  . Pressure ulcer 09/29/2015  . Altered mental status   . Acute encephalopathy 09/26/2015  . Chronic back pain 09/26/2015  . Generalized weakness 04/13/2015  . Lobar pneumonia due to unspecified organism 04/13/2015  . Prolonged QT interval   . Weakness 04/11/2015  . CAP (community acquired pneumonia) 01/25/2015  . Dehydration 01/25/2015  . Chronic diastolic heart failure ([place]) 06/19/2014  . Essential hypertension, benign 03/08/2014  . Hypothyroidism 03/08/2014  . Protein-calorie malnutrition, moderate ([place]) 03/08/2014  . Constipation 03/08/2014  . Physical deconditioning 03/08/2014  . Hypokalemia 02/27/2014  . Abnormal serum protein electrophoresis 01/07/2013  . Chronic kidney disease (CKD), stage III (moderate) 01/03/2013  . Polymyalgia rheumatica ([place]) 01/03/2013  . Osteopenia 01/03/2013  . Carotid artery disease ([place]) 01/03/2013  . Spinal stenosis of lumbar region 01/03/2013  . Coronary artery disease 01/03/2013  . Old MI (myocardial infarction) 01/03/2013  . Unspecified transient cerebral ischemia 11/18/2011     Linard Millers, PT 10/30/2015, 12:55 PM  [facility] [address] [place], [place], [address] Phone: [phone]   Fax:  [phone]  Name: Barry Wright MRN: [id number] Date of Birth: Aug 09, 1920

## 2015-11-02 ENCOUNTER — Ambulatory Visit: Payer: Medicare Other | Admitting: Physical Therapy

## 2015-11-02 DIAGNOSIS — R269 Unspecified abnormalities of gait and mobility: Secondary | ICD-10-CM | POA: Diagnosis not present

## 2015-11-02 DIAGNOSIS — R29898 Other symptoms and signs involving the musculoskeletal system: Secondary | ICD-10-CM | POA: Diagnosis not present

## 2015-11-02 DIAGNOSIS — Z7409 Other reduced mobility: Secondary | ICD-10-CM | POA: Diagnosis not present

## 2015-11-02 DIAGNOSIS — R262 Difficulty in walking, not elsewhere classified: Secondary | ICD-10-CM | POA: Diagnosis not present

## 2015-11-02 NOTE — Therapy (Signed)
[place], [place], [address] Phone: [phone]   Fax:  [phone]  Physical Therapy Treatment  Patient Details  Name: Barry Wright MRN: [id number] Date of Birth: 07/22/1920 Referring Provider: Lajean Manes, MD  Encounter Date: 11/02/2015      PT End of Session - 11/02/15 1155    Visit Number 4   Number of Visits 16   Date for PT Re-Evaluation 12/06/15   Authorization Type KX modifier   PT Start Time 1100   PT Stop Time 1148   PT Time Calculation (min) 48 min   Equipment Utilized During Treatment Gait belt   Activity Tolerance Patient tolerated treatment well;Patient limited by fatigue   Behavior During Therapy [facility] for tasks assessed/performed      Past Medical History  Diagnosis Date  . Chronic back pain   . Hypercholesteremia   . Hypothyroidism   . CAD (coronary artery disease)   . Chronic kidney disease (CKD), stage III (moderate)   . Polymyalgia rheumatica ([place])   . Left bundle branch block   . Hx of CABG   . History of nephrectomy   . Internal carotid artery stenosis   . Myocardial infarction ([place])   . CHF (congestive heart failure) ([place])   . Stroke [facility])     Past Surgical History  Procedure Laterality Date  . Coronary artery bypass graft    . Cholecystectomy    . Kidney surgery      kidney removed    There were no vitals filed for this visit.  Visit Diagnosis:  Abnormality of gait  Difficulty walking  Impaired mobility and activities of daily living  Leg weakness, bilateral      Subjective Assessment - 11/02/15 1110    Subjective "I am doing pretty good today"   Currently in Pain? No/denies   Multiple Pain Sites No                         [facility] Adult PT Treatment/Exercise - 11/02/15 0001    Ambulation/Gait   Ambulation/Gait Yes   Ambulation Distance (Feet) 116 Feet  36 3 x 12, 80 with RW (2 x 2 min rest breaks)  Requires min assist with  RW for steering, and stability during sit to stand transitions. VC to Stand up straight during walking.   Assistive device Parallel bars;Rolling walker   Gait Pattern Step-through pattern   Ambulation Surface Level   Gait Comments starting parallel bars and progressing to rw.    Knee/Hip Exercises: Seated   Long Arc Quad Strengthening;Right;Left;3 sets;10 reps   Long Arc Quad Weight 1 lbs.   Marching Strengthening;Right;Left;15 reps;2 sets   Marching Limitations 1   Abduction/Adduction  AROM;Strengthening;Both;2 sets;15 reps  abduction with red theraband   Sit to Sand 1 set;5 reps;with UE support  Cues for tucking bottom and standing up straight                PT Education - 11/02/15 1155    Education provided Yes   Education Details posture during standing tucking bottom beneath and straightening knees to stand up straight   Person(s) Educated Patient   Methods Explanation   Comprehension Verbalized understanding          PT Short Term Goals - 11/02/15 1158    PT SHORT TERM GOAL #1   Title pt will be independent with intial HEP after review   Time 4   Period  Weeks   Status On-going   PT SHORT TERM GOAL #2   Title He will be able to walk in bars 100 feet or more with turning with only contact guard.   Time 4   Period Weeks   Status On-going   PT SHORT TERM GOAL #3   Title He will be able to roll on mat RT and LT  without asssitance   Time 4   Period Weeks   Status On-going   PT SHORT TERM GOAL #4   Title He will be able to go sit to stand with min assist to walker   Time 4   Period Weeks   Status On-going           PT Long Term Goals - 11/02/15 1158    PT LONG TERM GOAL #1   Title pt will perform sit to stand without assistance at his WC to walker   Time 8   Period Weeks   Status On-going   PT LONG TERM GOAL #2   Title pt will ambulate x 30 ft with RW CGA   Time 8   Period Weeks   Status On-going   PT LONG TERM GOAL #3   Title pt will  tolerate standing activities in the clinic x 5 minutes to demonstrate improved endurance   Time 6   Period Weeks   Status On-going   PT LONG TERM GOAL #4   Title patient able to walk 50 feet with rolling walker and c.g with aide from W/C   Time 8   Period Weeks   Status On-going               Plan - 11/02/15 1156    Clinical Impression Statement Barry Wright was able to ambulate farther today with RW requiring min assist with RW for steering, and verbal cues to stand up straight. pt required ony 2 rest breaks during walking with RW but was able to ambulate 80 ft with RW with WC following. progress as tolerated.    PT Next Visit Plan Continue with gait focus, rw with increasing distance (w/c assist to follow). Work on Investment banker, operational, standing tolerance.   PT Home Exercise Plan standing up straight   Consulted and Agree with Plan of Care Patient        Problem List Patient Active Problem List   Diagnosis Date Noted  . Pressure ulcer 09/29/2015  . Altered mental status   . Acute encephalopathy 09/26/2015  . Chronic back pain 09/26/2015  . Generalized weakness 04/13/2015  . Lobar pneumonia due to unspecified organism 04/13/2015  . Prolonged QT interval   . Weakness 04/11/2015  . CAP (community acquired pneumonia) 01/25/2015  . Dehydration 01/25/2015  . Chronic diastolic heart failure ([place]) 06/19/2014  . Essential hypertension, benign 03/08/2014  . Hypothyroidism 03/08/2014  . Protein-calorie malnutrition, moderate ([place]) 03/08/2014  . Constipation 03/08/2014  . Physical deconditioning 03/08/2014  . Hypokalemia 02/27/2014  . Abnormal serum protein electrophoresis 01/07/2013  . Chronic kidney disease (CKD), stage III (moderate) 01/03/2013  . Polymyalgia rheumatica ([place]) 01/03/2013  . Osteopenia 01/03/2013  . Carotid artery disease ([place]) 01/03/2013  . Spinal stenosis of lumbar region 01/03/2013  . Coronary artery disease 01/03/2013  . Old MI (myocardial  infarction) 01/03/2013  . Unspecified transient cerebral ischemia 11/18/2011   [place] PT, DPT, LAT, ATC  11/02/2015  12:02 PM     [facility] [address] [place], [place], [address]  Phone: [phone]   Fax:  [phone]  Name: Barry Wright MRN: [id number] Date of Birth: September 10, 1920

## 2015-11-06 ENCOUNTER — Ambulatory Visit: Payer: Medicare Other | Admitting: Physical Therapy

## 2015-11-06 DIAGNOSIS — R269 Unspecified abnormalities of gait and mobility: Secondary | ICD-10-CM | POA: Diagnosis not present

## 2015-11-06 DIAGNOSIS — R262 Difficulty in walking, not elsewhere classified: Secondary | ICD-10-CM | POA: Diagnosis not present

## 2015-11-06 DIAGNOSIS — R29898 Other symptoms and signs involving the musculoskeletal system: Secondary | ICD-10-CM | POA: Diagnosis not present

## 2015-11-06 DIAGNOSIS — Z7409 Other reduced mobility: Secondary | ICD-10-CM | POA: Diagnosis not present

## 2015-11-06 NOTE — Therapy (Signed)
[place], [place], [address] Phone: [phone]   Fax:  [phone]  Physical Therapy Treatment  Patient Details  Name: Barry Wright MRN: [id number] Date of Birth: July 01, 1920 Referring Provider: Lajean Manes, MD  Encounter Date: 11/06/2015      PT End of Session - 11/06/15 1254    Visit Number 5   Number of Visits 16   Date for PT Re-Evaluation 12/06/15   Authorization Type KX modifier   PT Start Time 2831   PT Stop Time 1237   PT Time Calculation (min) 52 min   Equipment Utilized During Treatment Gait belt   Activity Tolerance Patient limited by pain;Patient limited by fatigue   Behavior During Therapy [facility] for tasks assessed/performed      Past Medical History  Diagnosis Date  . Chronic back pain   . Hypercholesteremia   . Hypothyroidism   . CAD (coronary artery disease)   . Chronic kidney disease (CKD), stage III (moderate)   . Polymyalgia rheumatica ([place])   . Left bundle branch block   . Hx of CABG   . History of nephrectomy   . Internal carotid artery stenosis   . Myocardial infarction ([place])   . CHF (congestive heart failure) ([place])   . Stroke [facility])     Past Surgical History  Procedure Laterality Date  . Coronary artery bypass graft    . Cholecystectomy    . Kidney surgery      kidney removed    There were no vitals filed for this visit.  Visit Diagnosis:  Abnormality of gait  Difficulty walking  Impaired mobility and activities of daily living  Leg weakness, bilateral                       [facility] Adult PT Treatment/Exercise - 11/06/15 1256    Ambulation/Gait   Ambulation/Gait Yes   Ambulation Distance (Feet) 60 Feet   Assistive device Parallel bars;Rolling walker   Gait Pattern Step-through pattern   Ambulation Surface Level   Gait velocity decreased   Gait Comments started with RW-pt c/o 3-4/10 LBP with gait today and requires multiple cues  throughout to extend trunk and knees    Knee/Hip Exercises: Seated   Long Arc Quad Strengthening;Both;3 sets;10 reps;Weights   Long Arc Quad Weight 5 lbs.   Marching Strengthening;Both;3 sets;15 reps;Weights   Marching Weights 5 lbs.   Hamstring Curl 2 sets;10 reps   Hamstring Limitations yellow band   Abduction/Adduction  AROM;Strengthening;Both;2 sets;15 reps  abduction with green theraband   Sit to Sand 3 sets;5 reps  with 1 UE support                PT Education - 11/06/15 1252    Education provided Yes   Education Details Sit-stand with assistance -spouse to give to care givers    Person(s) Educated Patient   Methods Explanation;Handout   Comprehension Verbalized understanding          PT Short Term Goals - 11/06/15 1305    PT SHORT TERM GOAL #1   Title pt will be independent with intial HEP after review   Time 4   Period Weeks   Status On-going   PT SHORT TERM GOAL #2   Title He will be able to walk in bars 100 feet or more with turning with only contact guard.   Time 4   Period Weeks   Status On-going   PT  SHORT TERM GOAL #3   Title He will be able to roll on mat RT and LT  without asssitance   Time 4   Period Weeks   Status Unable to assess   PT SHORT TERM GOAL #4   Title He will be able to go sit to stand with min assist to walker   Time 4   Period Weeks   Status Achieved           PT Long Term Goals - 11/06/15 1307    PT LONG TERM GOAL #1   Title pt will perform sit to stand without assistance at his WC to walker   Baseline CGA   Time 8   Period Weeks   Status Achieved   PT LONG TERM GOAL #2   Title pt will ambulate x 30 ft with RW CGA   Time 8   Period Weeks   Status Partially Met  with rest breaks   PT LONG TERM GOAL #3   Title pt will tolerate standing activities in the clinic x 5 minutes to demonstrate improved endurance   Time 6   Period Weeks   Status On-going   PT LONG TERM GOAL #4   Title patient able to walk 50 feet  with rolling walker and c.g with aide from W/C   Time 8   Period Weeks   Status On-going               Plan - 11/06/15 1309    Clinical Impression Statement Pt reports increased LBP with gait activities today which limits his gait distance. He continues to require constant cues for trunk extension and knee extension throughout treatment. He is able to rise from [facility] to RW with SBA and requires min verbal cues to reach back for his seat prior to sitting. His wife reports he does not do his exercises like he should at home. He demonstrates improved strength by tolerating increased reps and weight for seated exercises as well as his ability to perform multible sit-stand trials without assistance. Pt will see PT next visit. and discuss progress.    PT Next Visit Plan Discharge vs renewal         Problem List Patient Active Problem List   Diagnosis Date Noted  . Pressure ulcer 09/29/2015  . Altered mental status   . Acute encephalopathy 09/26/2015  . Chronic back pain 09/26/2015  . Generalized weakness 04/13/2015  . Lobar pneumonia due to unspecified organism 04/13/2015  . Prolonged QT interval   . Weakness 04/11/2015  . CAP (community acquired pneumonia) 01/25/2015  . Dehydration 01/25/2015  . Chronic diastolic heart failure ([place]) 06/19/2014  . Essential hypertension, benign 03/08/2014  . Hypothyroidism 03/08/2014  . Protein-calorie malnutrition, moderate ([place]) 03/08/2014  . Constipation 03/08/2014  . Physical deconditioning 03/08/2014  . Hypokalemia 02/27/2014  . Abnormal serum protein electrophoresis 01/07/2013  . Chronic kidney disease (CKD), stage III (moderate) 01/03/2013  . Polymyalgia rheumatica ([place]) 01/03/2013  . Osteopenia 01/03/2013  . Carotid artery disease ([place]) 01/03/2013  . Spinal stenosis of lumbar region 01/03/2013  . Coronary artery disease 01/03/2013  . Old MI (myocardial infarction) 01/03/2013  . Unspecified transient cerebral ischemia 11/18/2011     Dorene Ar, PTA 11/06/2015, 1:19 PM  [facility] [address] [place], [place], [address] Phone: [phone]   Fax:  [phone]  Name: ARCADIO COPE MRN: [id number] Date of Birth: 13-Jun-1920

## 2015-11-06 NOTE — Patient Instructions (Signed)
Sit to Stand / Stand to Sit / Transfers  PLEASE ASSIST Barry Wright WITH PRACTICING SIT TO STAND TRANSFERS    Sit on edge of a solid chair with arms, feet flat on floor. Lean forward over feet and stand up with hands on chair arms. Sit down slowly with hands on chair arms. Repeat __5-10__ times per session. Do __1__ sessions per day.

## 2015-11-08 ENCOUNTER — Ambulatory Visit: Payer: Medicare Other | Admitting: Physical Therapy

## 2015-11-08 DIAGNOSIS — R269 Unspecified abnormalities of gait and mobility: Secondary | ICD-10-CM

## 2015-11-08 DIAGNOSIS — R29898 Other symptoms and signs involving the musculoskeletal system: Secondary | ICD-10-CM

## 2015-11-08 DIAGNOSIS — Z7409 Other reduced mobility: Secondary | ICD-10-CM

## 2015-11-08 DIAGNOSIS — R262 Difficulty in walking, not elsewhere classified: Secondary | ICD-10-CM | POA: Diagnosis not present

## 2015-11-08 NOTE — Therapy (Signed)
[place], [place], [address] Phone: [phone]   Fax:  [phone]  Physical Therapy Treatment  Patient Details  Name: Barry Wright MRN: [id number] Date of Birth: Aug 07, 1920 Referring Provider: Lajean Manes, MD  Encounter Date: 11/08/2015      PT End of Session - 11/08/15 1253    Visit Number 6   Number of Visits 10   Date for PT Re-Evaluation 12/06/15   Authorization Type KX modifier   PT Start Time 1324   PT Stop Time 1236   PT Time Calculation (min) 51 min   Equipment Utilized During Treatment Gait belt   Activity Tolerance Patient tolerated treatment well;Patient limited by fatigue   Behavior During Therapy [facility] for tasks assessed/performed      Past Medical History  Diagnosis Date  . Chronic back pain   . Hypercholesteremia   . Hypothyroidism   . CAD (coronary artery disease)   . Chronic kidney disease (CKD), stage III (moderate)   . Polymyalgia rheumatica ([place])   . Left bundle branch block   . Hx of CABG   . History of nephrectomy   . Internal carotid artery stenosis   . Myocardial infarction ([place])   . CHF (congestive heart failure) ([place])   . Stroke [facility])     Past Surgical History  Procedure Laterality Date  . Coronary artery bypass graft    . Cholecystectomy    . Kidney surgery      kidney removed    There were no vitals filed for this visit.  Visit Diagnosis:  Abnormality of gait  Difficulty walking  Impaired mobility and activities of daily living  Leg weakness, bilateral      Subjective Assessment - 11/08/15 1248    Subjective Doing pretty good. Legs just seem to get weak.    Currently in Pain? No/denies                         [facility] Adult PT Treatment/Exercise - 11/08/15 0001    Transfers   Transfers Sit to Stand   Sit to Stand 4: Min guard;4: Min Training and development officer scooting forward and hand placement   Comments repeated cues  needed througout session for safety with hand position and sequence.    Ambulation/Gait   Ambulation/Gait Yes   Ambulation Distance (Feet) 70 Feet  total (X7 shorter ambulations)   Assistive device Rolling walker   Gait Pattern Step-through pattern   Ambulation Surface Level   Gait Comments working on ambulation with turn to chair, 180 degree and 90 degree turn to sit                PT Education - 11/08/15 1252    Education provided Yes   Education Details safety with turn to sit, plan of care, transfer sequence   Person(s) Educated Patient;Spouse;Caregiver(s)   Methods Explanation;Demonstration   Comprehension Verbalized understanding;Need further instruction          PT Short Term Goals - 11/06/15 1305    PT SHORT TERM GOAL #1   Title pt will be independent with intial HEP after review   Time 4   Period Weeks   Status On-going   PT SHORT TERM GOAL #2   Title He will be able to walk in bars 100 feet or more with turning with only contact guard.   Time 4   Period Weeks   Status On-going  PT SHORT TERM GOAL #3   Title He will be able to roll on mat RT and LT  without asssitance   Time 4   Period Weeks   Status Unable to assess   PT SHORT TERM GOAL #4   Title He will be able to go sit to stand with min assist to walker   Time 4   Period Weeks   Status Achieved           PT Long Term Goals - 11/06/15 1307    PT LONG TERM GOAL #1   Title pt will perform sit to stand without assistance at his WC to walker   Baseline CGA   Time 8   Period Weeks   Status Achieved   PT LONG TERM GOAL #2   Title pt will ambulate x 30 ft with RW CGA   Time 8   Period Weeks   Status Partially Met  with rest breaks   PT LONG TERM GOAL #3   Title pt will tolerate standing activities in the clinic x 5 minutes to demonstrate improved endurance   Time 6   Period Weeks   Status On-going   PT LONG TERM GOAL #4   Title patient able to walk 50 feet with rolling walker and c.g  with aide from W/C   Time 8   Period Weeks   Status On-going               Plan - 11/08/15 1257    Clinical Impression Statement Patient with gradual progress with ambulation but had discussion with patient and spouse about improvements being functional in nature. At this time the patient is not safe for ambulation with single person assistance making ambulation at home not safe yet. Further sessions will focus on shorter ambulation with turns to chair for functional mobility and safety at home. The patient is currently a high fall risk with this and would benefit from further sessions to address this with a focus on safety. Anticipating 4 further sessions to achieve this and then the patient will be able to continue with the assistance of his home aids. The patinet and his spouse were educated on this plan and in agreement. nt stated that he is using a slideboard with his transfers onto the commode at home   PT Next Visit Plan Short ambulation with focus on turn to chair. Stand pivot transfers with rw.    PT Home Exercise Plan continue with HEP.   Consulted and Agree with Plan of Care Patient;Family member/caregiver   Family Member Consulted spouse        Problem List Patient Active Problem List   Diagnosis Date Noted  . Pressure ulcer 09/29/2015  . Altered mental status   . Acute encephalopathy 09/26/2015  . Chronic back pain 09/26/2015  . Generalized weakness 04/13/2015  . Lobar pneumonia due to unspecified organism 04/13/2015  . Prolonged QT interval   . Weakness 04/11/2015  . CAP (community acquired pneumonia) 01/25/2015  . Dehydration 01/25/2015  . Chronic diastolic heart failure ([place]) 06/19/2014  . Essential hypertension, benign 03/08/2014  . Hypothyroidism 03/08/2014  . Protein-calorie malnutrition, moderate ([place]) 03/08/2014  . Constipation 03/08/2014  . Physical deconditioning 03/08/2014  . Hypokalemia 02/27/2014  . Abnormal serum protein electrophoresis  01/07/2013  . Chronic kidney disease (CKD), stage III (moderate) 01/03/2013  . Polymyalgia rheumatica ([place]) 01/03/2013  . Osteopenia 01/03/2013  . Carotid artery disease ([place]) 01/03/2013  . Spinal stenosis of lumbar region 01/03/2013  .  Coronary artery disease 01/03/2013  . Old MI (myocardial infarction) 01/03/2013  . Unspecified transient cerebral ischemia 11/18/2011    Linard Millers, PT 11/08/2015, 1:08 PM  [facility] [address] [place], [place], [address] Phone: [phone]   Fax:  [phone]  Name: Barry Wright MRN: [id number] Date of Birth: 1920-04-25

## 2015-11-13 ENCOUNTER — Encounter (HOSPITAL_COMMUNITY): Payer: Self-pay

## 2015-11-13 ENCOUNTER — Emergency Department (HOSPITAL_COMMUNITY)
Admission: EM | Admit: 2015-11-13 | Discharge: 2015-11-13 | Disposition: A | Discharge status: Home / Self Care | Payer: Medicare Other | Attending: Emergency Medicine | Admitting: Emergency Medicine

## 2015-11-13 ENCOUNTER — Emergency Department (HOSPITAL_COMMUNITY): Payer: Medicare Other

## 2015-11-13 DIAGNOSIS — S0081XA Abrasion of other part of head, initial encounter: Secondary | ICD-10-CM | POA: Insufficient documentation

## 2015-11-13 DIAGNOSIS — R531 Weakness: Secondary | ICD-10-CM | POA: Diagnosis not present

## 2015-11-13 DIAGNOSIS — I509 Heart failure, unspecified: Secondary | ICD-10-CM | POA: Insufficient documentation

## 2015-11-13 DIAGNOSIS — W01198A Fall on same level from slipping, tripping and stumbling with subsequent striking against other object, initial encounter: Secondary | ICD-10-CM | POA: Insufficient documentation

## 2015-11-13 DIAGNOSIS — T148 Other injury of unspecified body region: Secondary | ICD-10-CM | POA: Diagnosis not present

## 2015-11-13 DIAGNOSIS — I2510 Atherosclerotic heart disease of native coronary artery without angina pectoris: Secondary | ICD-10-CM | POA: Insufficient documentation

## 2015-11-13 DIAGNOSIS — Z87891 Personal history of nicotine dependence: Secondary | ICD-10-CM | POA: Insufficient documentation

## 2015-11-13 DIAGNOSIS — Y9389 Activity, other specified: Secondary | ICD-10-CM | POA: Insufficient documentation

## 2015-11-13 DIAGNOSIS — I252 Old myocardial infarction: Secondary | ICD-10-CM | POA: Insufficient documentation

## 2015-11-13 DIAGNOSIS — Z880 Allergy status to penicillin: Secondary | ICD-10-CM | POA: Diagnosis not present

## 2015-11-13 DIAGNOSIS — S0990XA Unspecified injury of head, initial encounter: Secondary | ICD-10-CM | POA: Diagnosis not present

## 2015-11-13 DIAGNOSIS — S0091XA Abrasion of unspecified part of head, initial encounter: Secondary | ICD-10-CM | POA: Diagnosis not present

## 2015-11-13 DIAGNOSIS — Y92009 Unspecified place in unspecified non-institutional (private) residence as the place of occurrence of the external cause: Secondary | ICD-10-CM | POA: Diagnosis not present

## 2015-11-13 DIAGNOSIS — W19XXXA Unspecified fall, initial encounter: Secondary | ICD-10-CM

## 2015-11-13 DIAGNOSIS — Z7982 Long term (current) use of aspirin: Secondary | ICD-10-CM | POA: Diagnosis not present

## 2015-11-13 DIAGNOSIS — Z8739 Personal history of other diseases of the musculoskeletal system and connective tissue: Secondary | ICD-10-CM | POA: Insufficient documentation

## 2015-11-13 DIAGNOSIS — G8929 Other chronic pain: Secondary | ICD-10-CM | POA: Diagnosis not present

## 2015-11-13 DIAGNOSIS — E039 Hypothyroidism, unspecified: Secondary | ICD-10-CM | POA: Diagnosis not present

## 2015-11-13 DIAGNOSIS — Z951 Presence of aortocoronary bypass graft: Secondary | ICD-10-CM | POA: Diagnosis not present

## 2015-11-13 DIAGNOSIS — Z7902 Long term (current) use of antithrombotics/antiplatelets: Secondary | ICD-10-CM | POA: Insufficient documentation

## 2015-11-13 DIAGNOSIS — Y998 Other external cause status: Secondary | ICD-10-CM | POA: Diagnosis not present

## 2015-11-13 DIAGNOSIS — N183 Chronic kidney disease, stage 3 (moderate): Secondary | ICD-10-CM | POA: Diagnosis not present

## 2015-11-13 DIAGNOSIS — Z8673 Personal history of transient ischemic attack (TIA), and cerebral infarction without residual deficits: Secondary | ICD-10-CM | POA: Insufficient documentation

## 2015-11-13 NOTE — ED Provider Notes (Signed)
CSN: [id number]     Arrival date & time 11/13/15  1223 History   First MD Initiated Contact with Patient 11/13/15 1323     Chief Complaint  Patient presents with  . Fall     (Consider location/radiation/quality/duration/timing/severity/associated sxs/prior Treatment) HPI  82 year old male presents after a fall at home. Patient has a history of difficulty walking with bilateral lower extremity weakness that he is currently in rehabilitation for. He was with his care provider ambulating with his walker today when he went for an extra round and ended up falling. It seemed like his legs gave out on him according to the provider. There was no syncope. Patient never had dizziness. Patient denies chest pain. His head hit the ground but he did not lose consciousness. He has an abrasion over his right for head. He is on Plavix for prior stroke and CAD. He is at his normal baseline per family.  Past Medical History  Diagnosis Date  . Chronic back pain   . Hypercholesteremia   . Hypothyroidism   . CAD (coronary artery disease)   . Chronic kidney disease (CKD), stage III (moderate)   . Polymyalgia rheumatica ([place])   . Left bundle branch block   . Hx of CABG   . History of nephrectomy   . Internal carotid artery stenosis   . Myocardial infarction ([place])   . CHF (congestive heart failure) ([place])   . Stroke [facility])    Past Surgical History  Procedure Laterality Date  . Coronary artery bypass graft    . Cholecystectomy    . Kidney surgery      kidney removed   Family History  Problem Relation Age of Onset  . Other Mother     [place] Flu  . Heart disease Father    Social History  Substance Use Topics  . Smoking status: Former Smoker    Types: Cigarettes    Quit date: 11/10/1970  . Smokeless tobacco: Never Used  . Alcohol Use: No    Review of Systems  Respiratory: Negative for shortness of breath.   Cardiovascular: Negative for chest pain and palpitations.  Gastrointestinal: Negative  for vomiting.  Skin: Positive for wound.  Neurological: Positive for weakness. Negative for dizziness, syncope, light-headedness, numbness and headaches.  All other systems reviewed and are negative.     Allergies  Penicillins  Home Medications   Prior to Admission medications   Medication Sig Start Date End Date Taking? Authorizing Provider  acetaminophen (TYLENOL) 500 MG tablet Take 1,000 mg by mouth 2 (two) times daily.     Historical Provider, MD  aspirin 81 MG tablet Take 81 mg by mouth daily.    Historical Provider, MD  calcium carbonate (OS-CAL - DOSED IN MG OF ELEMENTAL CALCIUM) 1250 MG tablet Take 1 tablet by mouth daily with breakfast.    Historical Provider, MD  clopidogrel (PLAVIX) 75 MG tablet Take 75 mg by mouth daily.      Historical Provider, MD  folic acid (FOLVITE) 1 MG tablet Take 1 mg by mouth daily.    Historical Provider, MD  furosemide (LASIX) 20 MG tablet Take 0.5 tablets (10 mg total) by mouth daily. 01/30/15   Theodis Blaze, MD  methotrexate (RHEUMATREX) 2.5 MG tablet Take 10 mg by mouth every Wednesday.    Historical Provider, MD  Multiple Vitamins-Minerals (MULTIVITAMINS THER. W/MINERALS) TABS Take 1 tablet by mouth daily.      Historical Provider, MD  nitroGLYCERIN (NITROSTAT) 0.4 MG SL tablet Place  0.4 mg under the tongue every 5 (five) minutes as needed for chest pain.    Historical Provider, MD  polyethylene glycol (MIRALAX / GLYCOLAX) packet Take 17 g by mouth daily as needed for mild constipation.     Historical Provider, MD  SYNTHROID 100 MCG tablet Take 100 mcg by mouth daily before breakfast.  09/19/15   Historical Provider, MD  traMADol (ULTRAM) 50 MG tablet Take one tablet by mouth twice daily for pains PRN 09/29/15   Belkys A Regalado, MD   BP 124/68 mmHg  Pulse 79  Temp(Src) 98.2 F (36.8 C) (Oral)  Resp 16  Ht 5\' 11"  (1.803 m)  Wt 195 lb (88.451 kg)  BMI 27.21 kg/m2  SpO2 93% Physical Exam  Constitutional: He is oriented to person, place,  and time. He appears well-developed and well-nourished.  HENT:  Head: Normocephalic. Head is with abrasion (superficial, right forehead).    Right Ear: External ear normal.  Left Ear: External ear normal.  Nose: Nose normal.  Eyes: EOM are normal. Pupils are equal, round, and reactive to light. Right eye exhibits no discharge. Left eye exhibits no discharge.  Neck: Neck supple.  Cardiovascular: Normal rate, regular rhythm, normal heart sounds and intact distal pulses.   Pulmonary/Chest: Effort normal and breath sounds normal.  Abdominal: Soft. He exhibits no distension.  Musculoskeletal: He exhibits no edema.  Neurological: He is alert and oriented to person, place, and time.  CN 2-12 grossly intact. 5/5 strength in all 4 extremities.  Skin: Skin is warm and dry.  Nursing note and vitals reviewed.   ED Course  Procedures (including critical care time) Labs Review Labs Reviewed - No data to display  Imaging Review Ct Head Wo Contrast  11/13/2015  CLINICAL DATA:  Pt was transferring from walker to wheelchair, and fell onto the carpeted floor; abrasion to RIGHT forehead; pt on blood thinners EXAM: CT HEAD WITHOUT CONTRAST TECHNIQUE: Contiguous axial images were obtained from the base of the skull through the vertex without intravenous contrast. COMPARISON:  09/27/2015 MRI and CT, 10/31/2014 FINDINGS: There is central and cortical atrophy. Periventricular white matter changes are consistent with small vessel disease. There is no intra or extra-axial fluid collection or mass lesion. The basilar cisterns and ventricles have a normal appearance. There is no CT evidence for acute infarction or hemorrhage. Left frontal scalp edema/laceration noted. No underlying calvarial fracture. Small right sphenoid sinus air-fluid level. No mastoid effusion. IMPRESSION: 1. Significant atrophy and small vessel disease. 2.  No evidence for acute intracranial abnormality. 3. Left frontal scalp edema and laceration.  4. Small amount of fluid within the right sphenoid sinus. Electronically Signed   By: Nolon Nations M.D.   On: 11/13/2015 14:31   I have personally reviewed and evaluated these images and lab results as part of my medical decision-making.   EKG Interpretation   Date/Time:  Tuesday November 13 2015 12:29:03 EST Ventricular Rate:  77 PR Interval:    QRS Duration: 172 QT Interval:  447 QTC Calculation: 506 R Axis:   -106 Text Interpretation:  Sinus rhythm Right bundle branch block no  significant change since Nov 2016 Confirmed by Regenia Skeeter  MD, Jadore Veals ([id number])  on 11/13/2015 1:25:16 PM      MDM   Final diagnoses:  Forehead abrasion, initial encounter  Fall, initial encounter    Patient with a mechanical fall while using a walker. Fell from height and hit his head on the ground. Small abrasion but patient is on blood  thinners and thus a CT was obtained. CT unremarkable. There is evidence of a scalp edema and laceration on the left side according to the CT but this does not correlate any findings on the physical exam. His tetanus is up-to-date per patient. Discharge home with return precautions.  Sherwood Gambler, MD 11/13/15 [phone]

## 2015-11-13 NOTE — ED Notes (Signed)
Patient transported to CT 

## 2015-11-13 NOTE — ED Notes (Signed)
Pt was transitioning from walker to wheelchair when he lost his balance and fell to carpeted floor.  Pt has abrasion to right forehead but denies any symptoms at this time.  Pt denies LOC or other trauma.  Pt is on blood thinners.

## 2015-11-15 ENCOUNTER — Ambulatory Visit: Payer: Medicare Other | Attending: Geriatric Medicine

## 2015-11-15 DIAGNOSIS — Z7409 Other reduced mobility: Secondary | ICD-10-CM

## 2015-11-15 DIAGNOSIS — R262 Difficulty in walking, not elsewhere classified: Secondary | ICD-10-CM | POA: Diagnosis not present

## 2015-11-15 DIAGNOSIS — R29898 Other symptoms and signs involving the musculoskeletal system: Secondary | ICD-10-CM | POA: Diagnosis not present

## 2015-11-15 DIAGNOSIS — R269 Unspecified abnormalities of gait and mobility: Secondary | ICD-10-CM | POA: Diagnosis not present

## 2015-11-15 DIAGNOSIS — Z789 Other specified health status: Secondary | ICD-10-CM

## 2015-11-15 NOTE — Therapy (Signed)
[place], [place], [address] Phone: [phone]   Fax:  [phone]  Physical Therapy Treatment  Patient Details  Name: Barry Wright MRN: [id number] Date of Birth: 25-Jan-1920 Referring Provider: Lajean Manes, MD  Encounter Date: 11/15/2015      PT End of Session - 11/15/15 1400    Visit Number 7   Number of Visits 10   Date for PT Re-Evaluation 12/06/15   PT Start Time 3810   PT Stop Time 1230   PT Time Calculation (min) 45 min   Equipment Utilized During Treatment Gait belt   Activity Tolerance Patient tolerated treatment well;Patient limited by fatigue   Behavior During Therapy [facility] for tasks assessed/performed      Past Medical History  Diagnosis Date  . Chronic back pain   . Hypercholesteremia   . Hypothyroidism   . CAD (coronary artery disease)   . Chronic kidney disease (CKD), stage III (moderate)   . Polymyalgia rheumatica ([place])   . Left bundle branch block   . Hx of CABG   . History of nephrectomy   . Internal carotid artery stenosis   . Myocardial infarction ([place])   . CHF (congestive heart failure) ([place])   . Stroke [facility])     Past Surgical History  Procedure Laterality Date  . Coronary artery bypass graft    . Cholecystectomy    . Kidney surgery      kidney removed    There were no vitals filed for this visit.  Visit Diagnosis:  Abnormality of gait  Difficulty walking  Impaired mobility and activities of daily living  Leg weakness, bilateral      Subjective Assessment - 11/15/15 1157    Subjective He reports fall 2 days ago while walking with RW and aide. He hit his head.  He has not walked since then. He has 3 visits after today.    Patient is accompained by: Family member  aide   Currently in Pain? No/denies                         [facility] Adult PT Treatment/Exercise - 11/15/15 1158    Ambulation/Gait   Ambulation Distance (Feet) 23 Feet  +2  contact guard and moderate asssit to turn to sit   Assistive device Rolling walker   Gait Pattern Step-through pattern   Ambulation Surface Level   Gait Comments Poor ability to turn with walker   Lumbar Exercises: Aerobic   Stationary Bike Nustep L5 5 min LE only   Lumbar Exercises: Seated   Sit to Stand 10 reps  with +1 moderate asssist from raised surface on mat.    Knee/Hip Exercises: Seated   Marching Right;Left;2 sets;15 reps   Marching Limitations with verbal and tactile cues for technique   Abduction/Adduction  AROM;Right;Left;2 sets;10 reps   Abd/Adduction Limitations Manually resisted x 15 with tactile and verbal cues for  technique.     LAQ with tactile cues x 20 reps LT and RT,                PT Education - 11/15/15 1358    Education provided Yes   Education Details I was aske if he would return to walking independently and i responded no. Dut to age , risk of fall, duration of weakness , poor response to therapy prior to him coming here it was not likly he would progress to independence with mobility  Person(s) Educated Patient;Spouse   Methods Explanation   Comprehension Verbalized understanding          PT Short Term Goals - 11/15/15 1403    PT SHORT TERM GOAL #1   Title pt will be independent with intial HEP after review   Status On-going   PT SHORT TERM GOAL #2   Title He will be able to walk in bars 100 feet or more with turning with only contact guard.   Status On-going   PT SHORT TERM GOAL #3   Title He will be able to roll on mat RT and LT  without asssitance   Status On-going   PT SHORT TERM GOAL #4   Title He will be able to go sit to stand with min assist to walker   Status Achieved           PT Long Term Goals - 11/06/15 1307    PT LONG TERM GOAL #1   Title pt will perform sit to stand without assistance at his WC to walker   Baseline CGA   Time 8   Period Weeks   Status Achieved   PT LONG TERM GOAL #2   Title pt will ambulate  x 30 ft with RW CGA   Time 8   Period Weeks   Status Partially Met  with rest breaks   PT LONG TERM GOAL #3   Title pt will tolerate standing activities in the clinic x 5 minutes to demonstrate improved endurance   Time 6   Period Weeks   Status On-going   PT LONG TERM GOAL #4   Title patient able to walk 50 feet with rolling walker and c.g with aide from W/C   Time 8   Period Weeks   Status On-going               Plan - 11/15/15 1400    Clinical Impression Statement [name] does not appear to be able to progress to independence and hi risk of fall is high as exhibited earlier this week. For the  last 3 visits  HEP will be emphasized   PT Next Visit Plan REview HEP and progress as able, review goals   Consulted and Agree with Plan of Care Patient   Family Member Consulted spouse        Problem List Patient Active Problem List   Diagnosis Date Noted  . Pressure ulcer 09/29/2015  . Altered mental status   . Acute encephalopathy 09/26/2015  . Chronic back pain 09/26/2015  . Generalized weakness 04/13/2015  . Lobar pneumonia due to unspecified organism 04/13/2015  . Prolonged QT interval   . Weakness 04/11/2015  . CAP (community acquired pneumonia) 01/25/2015  . Dehydration 01/25/2015  . Chronic diastolic heart failure ([place]) 06/19/2014  . Essential hypertension, benign 03/08/2014  . Hypothyroidism 03/08/2014  . Protein-calorie malnutrition, moderate ([place]) 03/08/2014  . Constipation 03/08/2014  . Physical deconditioning 03/08/2014  . Hypokalemia 02/27/2014  . Abnormal serum protein electrophoresis 01/07/2013  . Chronic kidney disease (CKD), stage III (moderate) 01/03/2013  . Polymyalgia rheumatica ([place]) 01/03/2013  . Osteopenia 01/03/2013  . Carotid artery disease ([place]) 01/03/2013  . Spinal stenosis of lumbar region 01/03/2013  . Coronary artery disease 01/03/2013  . Old MI (myocardial infarction) 01/03/2013  . Unspecified transient cerebral ischemia  11/18/2011    [name] PT 11/15/2015, 2:05 PM  [facility] [address] [place], [place], [address] Phone: [phone]  Fax:  [phone]  Name: Barry Wright MRN: [id number] Date of Birth: 01/13/1920

## 2015-11-15 NOTE — Patient Instructions (Signed)
Asked pt and wife to bring in all HEP instruction sheets next visit to revise and start to finish HEP

## 2015-11-19 ENCOUNTER — Ambulatory Visit: Payer: Medicare Other | Admitting: Physical Therapy

## 2015-11-28 ENCOUNTER — Ambulatory Visit: Payer: Medicare Other

## 2015-11-28 DIAGNOSIS — Z789 Other specified health status: Secondary | ICD-10-CM

## 2015-11-28 DIAGNOSIS — Z7409 Other reduced mobility: Secondary | ICD-10-CM

## 2015-11-28 DIAGNOSIS — R262 Difficulty in walking, not elsewhere classified: Secondary | ICD-10-CM | POA: Diagnosis not present

## 2015-11-28 DIAGNOSIS — R269 Unspecified abnormalities of gait and mobility: Secondary | ICD-10-CM

## 2015-11-28 DIAGNOSIS — R29898 Other symptoms and signs involving the musculoskeletal system: Secondary | ICD-10-CM | POA: Diagnosis not present

## 2015-11-28 NOTE — Therapy (Signed)
[place], [place], [address] Phone: [phone]   Fax:  [phone]  Physical Therapy Treatment  Patient Details  Name: Barry Wright MRN: [id number] Date of Birth: [birth year] Referring Provider: Lajean Manes, MD  Encounter Date: 11/28/2015      PT End of Session - 11/28/15 1330    Visit Number 8   Number of Visits 10   Date for PT Re-Evaluation 12/06/15   PT Start Time 3785   PT Stop Time 1230   PT Time Calculation (min) 45 min   Equipment Utilized During Treatment Gait belt   Activity Tolerance Patient limited by fatigue   Behavior During Therapy [facility] for tasks assessed/performed      Past Medical History  Diagnosis Date  . Chronic back pain   . Hypercholesteremia   . Hypothyroidism   . CAD (coronary artery disease)   . Chronic kidney disease (CKD), stage III (moderate)   . Polymyalgia rheumatica ([place])   . Left bundle branch block   . Hx of CABG   . History of nephrectomy   . Internal carotid artery stenosis   . Myocardial infarction ([place])   . CHF (congestive heart failure) ([place])   . Stroke [facility])     Past Surgical History  Procedure Laterality Date  . Coronary artery bypass graft    . Cholecystectomy    . Kidney surgery      kidney removed    There were no vitals filed for this visit.  Visit Diagnosis:  Abnormality of gait - Plan: PT plan of care cert/re-cert  Difficulty walking - Plan: PT plan of care cert/re-cert  Impaired mobility and activities of daily living - Plan: PT plan of care cert/re-cert  Leg weakness, bilateral - Plan: PT plan of care cert/re-cert      Subjective Assessment - 11/28/15 1240    Subjective No falls . Nothing has changed . He does HEP daily then works on standing 3 min at a time.    Patient is accompained by: Family member  caregiver   Limitations Walking;Standing   Currently in Pain? No/denies                         [facility]  Adult PT Treatment/Exercise - 11/28/15 1158    Ambulation/Gait   Ambulation Distance (Feet) 8 Feet   Assistive device Rolling walker   Gait Pattern Step-to pattern   Ambulation Surface Level;Indoor   Gait Comments +1 assist to stand mod assist and +1 close CG to walk 10  then 12 feet with 1-2 min rest due to fatigue   Lumbar Exercises: Seated   Long Arc Quad on Chair Right;Left;AROM;15 reps   LAQ on Chair Limitations 3 second hold   Other Seated Lumbar Exercises marching x 12 RT and LT.  ANkle DF andd PF x 15 each followed by ball squeezes x 15 ,    Other Seated Lumbar Exercises clams with greeen band x15                  PT Short Term Goals - 11/28/15 1330    PT SHORT TERM GOAL #1   Title pt will be independent with intial HEP after review   Status Achieved   PT SHORT TERM GOAL #2   Title He will be able to walk in bars 100 feet or more with turning with only contact guard.   Status On-going   PT SHORT  TERM GOAL #3   Title He will be able to roll on mat RT and LT  without asssitance   Status On-going   PT SHORT TERM GOAL #4   Title He will be able to go sit to stand with min assist to walker   Status Achieved           PT Long Term Goals - 12/28/2015 1338    PT LONG TERM GOAL #1   Title pt will perform sit to stand without assistance at his WC to walker   Status On-going   PT LONG TERM GOAL #2   Title pt will ambulate x 30 ft with RW CGA   Status Partially Met   PT LONG TERM GOAL #3   Title pt will tolerate standing activities in the clinic x 5 minutes to demonstrate improved endurance   Status On-going   PT LONG TERM GOAL #4   Title patient able to walk 50 feet with rolling walker and c.g with aide from [place] - 12-28-15 1332    Clinical Impression Statement [name] has met HEP goal and greater ease with getting up to walker but continues to be very weak and did not walk far today due to fatigue.  He has been out  for almost 2 weeks due to weather . We will continue for  3 weeks 2 x/week  and most likely will discharge then.   PT Frequency 2x / week   PT Duration 3 weeks   PT Next Visit Plan Work on and review mat mobility/ goals , walk in bars 100 feet if tolerates   PT Home Exercise Plan continue with HEP.   Consulted and Agree with Plan of Care Patient;Family member/caregiver  care giver   Family Member Consulted spouse          G-Codes - 12-28-2015 1338    Functional Assessment Tool Used clinical judgement   Functional Limitation Mobility: Walking and moving around   Mobility: Walking and Moving Around Current Status [phone]) At least 80 percent but less than 100 percent impaired, limited or restricted   Mobility: Walking and Moving Around Goal Status [phone]) At least 60 percent but less than 80 percent impaired, limited or restricted      Problem List Patient Active Problem List   Diagnosis Date Noted  . Pressure ulcer 09/29/2015  . Altered mental status   . Acute encephalopathy 09/26/2015  . Chronic back pain 09/26/2015  . Generalized weakness 04/13/2015  . Lobar pneumonia due to unspecified organism 04/13/2015  . Prolonged QT interval   . Weakness 04/11/2015  . CAP (community acquired pneumonia) 01/25/2015  . Dehydration 01/25/2015  . Chronic diastolic heart failure ([place]) 06/19/2014  . Essential hypertension, benign 03/08/2014  . Hypothyroidism 03/08/2014  . Protein-calorie malnutrition, moderate ([place]) 03/08/2014  . Constipation 03/08/2014  . Physical deconditioning 03/08/2014  . Hypokalemia 02/27/2014  . Abnormal serum protein electrophoresis 01/07/2013  . Chronic kidney disease (CKD), stage III (moderate) 01/03/2013  . Polymyalgia rheumatica ([place]) 01/03/2013  . Osteopenia 01/03/2013  . Carotid artery disease ([place]) 01/03/2013  . Spinal stenosis of lumbar region 01/03/2013  . Coronary artery disease 01/03/2013  . Old MI (myocardial infarction) 01/03/2013  . Unspecified  transient cerebral ischemia 11/18/2011    [name] PT 12/28/2015, 1:44 PM  [facility] [address] [place], [place], [address]  Phone: [phone]   Fax:  [phone]  Name: Barry Wright MRN: [id number] Date of Birth: September 10, 1920

## 2015-11-28 NOTE — Patient Instructions (Signed)
From cabinet issued HEP including , sitting, LAQ, clams into pillow, abduction into green band, ankle DF and PF, Marching, and sitting with erect posture with arm raises 10-15 reps daily

## 2015-12-03 ENCOUNTER — Ambulatory Visit: Payer: Medicare Other | Admitting: Physical Therapy

## 2015-12-03 DIAGNOSIS — R269 Unspecified abnormalities of gait and mobility: Secondary | ICD-10-CM

## 2015-12-03 DIAGNOSIS — Z7409 Other reduced mobility: Secondary | ICD-10-CM | POA: Diagnosis not present

## 2015-12-03 DIAGNOSIS — R29898 Other symptoms and signs involving the musculoskeletal system: Secondary | ICD-10-CM

## 2015-12-03 DIAGNOSIS — R262 Difficulty in walking, not elsewhere classified: Secondary | ICD-10-CM | POA: Diagnosis not present

## 2015-12-03 NOTE — Therapy (Signed)
[place], [place], [address] Phone: [phone]   Fax:  [phone]  Physical Therapy Treatment  Patient Details  Name: Barry Wright MRN: [id number] Date of Birth: 13-Feb-1920 Referring Provider: Lajean Manes, MD  Encounter Date: 12/03/2015      PT End of Session - 12/03/15 1505    Visit Number 9   Number of Visits 10   Date for PT Re-Evaluation 12/06/15   Authorization Type KX modifier   PT Start Time 1150   PT Stop Time 1230   PT Time Calculation (min) 40 min      Past Medical History  Diagnosis Date  . Chronic back pain   . Hypercholesteremia   . Hypothyroidism   . CAD (coronary artery disease)   . Chronic kidney disease (CKD), stage III (moderate)   . Polymyalgia rheumatica ([place])   . Left bundle branch block   . Hx of CABG   . History of nephrectomy   . Internal carotid artery stenosis   . Myocardial infarction ([place])   . CHF (congestive heart failure) ([place])   . Stroke [facility])     Past Surgical History  Procedure Laterality Date  . Coronary artery bypass graft    . Cholecystectomy    . Kidney surgery      kidney removed    There were no vitals filed for this visit.  Visit Diagnosis:  Abnormality of gait  Difficulty walking  Impaired mobility and activities of daily living  Leg weakness, bilateral      Subjective Assessment - 12/03/15 1504    Subjective no pain unless and stand straight 5/10. PEr wife he practices seated exercises, sit-stands and standing tolerance for HEP.   Currently in Pain? No/denies                         [facility] Adult PT Treatment/Exercise - 12/03/15 0001    Transfers   Transfers Sit to Stand   Sit to Stand 4: Min guard   Number of Reps --  12 reps   Comments Requires verbal cues for sit-stand and reach back for WC to sit   Ambulation/Gait   Ambulation Distance (Feet) 111 Feet   Assistive device Parallel bars  and 2 trials  with RW 37f, 2 ft-fatigued   Gait Pattern Step-to pattern   Ambulation Surface Level;Indoor   Gait Comments CGA, verabal cues for hand position for sit- stands                  PT Short Term Goals - 11/28/15 1330    PT SHORT TERM GOAL #1   Title pt will be independent with intial HEP after review   Status Achieved   PT SHORT TERM GOAL #2   Title He will be able to walk in bars 100 feet or more with turning with only contact guard.   Status On-going   PT SHORT TERM GOAL #3   Title He will be able to roll on mat RT and LT  without asssitance   Status On-going   PT SHORT TERM GOAL #4   Title He will be able to go sit to stand with min assist to walker   Status Achieved           PT Long Term Goals - 11/28/15 1338    PT LONG TERM GOAL #1   Title pt will perform sit to stand without assistance at his  WC to walker   Status On-going   PT LONG TERM GOAL #2   Title pt will ambulate x 30 ft with RW CGA   Status Partially Met   PT LONG TERM GOAL #3   Title pt will tolerate standing activities in the clinic x 5 minutes to demonstrate improved endurance   Status On-going   PT LONG TERM GOAL #4   Title patient able to walk 50 feet with rolling walker and c.g with aide from [place] - 12/03/15 1507    Clinical Impression Statement Wife requests more of treatment spent working on gait in parallel bars. Pt walked 31f x 10 with 1 minute rest breaks. Attempted to ambualte with RW at end of treatment with pt too fatigued to ambulate more than 5 ft. Will try this again at the beginning of treatment.    PT Next Visit Plan continue gait in bars and RW as able- check mat mobility goals         Problem List Patient Active Problem List   Diagnosis Date Noted  . Pressure ulcer 09/29/2015  . Altered mental status   . Acute encephalopathy 09/26/2015  . Chronic back pain 09/26/2015  . Generalized weakness 04/13/2015  . Lobar pneumonia due  to unspecified organism 04/13/2015  . Prolonged QT interval   . Weakness 04/11/2015  . CAP (community acquired pneumonia) 01/25/2015  . Dehydration 01/25/2015  . Chronic diastolic heart failure ([place] 06/19/2014  . Essential hypertension, benign 03/08/2014  . Hypothyroidism 03/08/2014  . Protein-calorie malnutrition, moderate ([place] 03/08/2014  . Constipation 03/08/2014  . Physical deconditioning 03/08/2014  . Hypokalemia 02/27/2014  . Abnormal serum protein electrophoresis 01/07/2013  . Chronic kidney disease (CKD), stage III (moderate) 01/03/2013  . Polymyalgia rheumatica ([place] 01/03/2013  . Osteopenia 01/03/2013  . Carotid artery disease ([place] 01/03/2013  . Spinal stenosis of lumbar region 01/03/2013  . Coronary artery disease 01/03/2013  . Old MI (myocardial infarction) 01/03/2013  . Unspecified transient cerebral ischemia 11/18/2011    DDorene Ar PTA 12/03/2015, 3:17 PM  [facility] [address]Phone: [phone]  Fax:  [phone] Name: Barry GRISBYMRN: [id number] of Birth: 803-29-1921

## 2015-12-05 ENCOUNTER — Encounter: Payer: Medicare Other | Admitting: Physical Therapy

## 2015-12-05 ENCOUNTER — Ambulatory Visit: Payer: Medicare Other | Admitting: Physical Therapy

## 2015-12-05 DIAGNOSIS — R29898 Other symptoms and signs involving the musculoskeletal system: Secondary | ICD-10-CM

## 2015-12-05 DIAGNOSIS — R269 Unspecified abnormalities of gait and mobility: Secondary | ICD-10-CM

## 2015-12-05 DIAGNOSIS — R262 Difficulty in walking, not elsewhere classified: Secondary | ICD-10-CM

## 2015-12-05 DIAGNOSIS — Z7409 Other reduced mobility: Secondary | ICD-10-CM | POA: Diagnosis not present

## 2015-12-05 DIAGNOSIS — Z789 Other specified health status: Secondary | ICD-10-CM

## 2015-12-05 NOTE — Therapy (Signed)
Barry Wright, Barry Wright, [address] Phone: [phone]   Fax:  [phone]  Physical Therapy Treatment  Patient Details  Name: Barry Wright MRN: [id number] Date of Birth: Nov 23, 1919 Referring Provider: Lajean Manes, MD  Encounter Date: 12/05/2015      PT End of Session - 12/05/15 1250    Visit Number 10   Number of Visits 10   Date for PT Re-Evaluation 12/06/15   Authorization Type KX modifier   PT Start Time 1147   PT Stop Time 1224   PT Time Calculation (min) 37 min   Equipment Utilized During Treatment Gait belt   Activity Tolerance Patient limited by fatigue   Behavior During Therapy [facility] for tasks assessed/performed      Past Medical History  Diagnosis Date  . Chronic back pain   . Hypercholesteremia   . Hypothyroidism   . CAD (coronary artery disease)   . Chronic kidney disease (CKD), stage III (moderate)   . Polymyalgia rheumatica ([place])   . Left bundle branch block   . Hx of CABG   . History of nephrectomy   . Internal carotid artery stenosis   . Myocardial infarction ([place])   . CHF (congestive heart failure) ([place])   . Stroke [name])     Past Surgical History  Procedure Laterality Date  . Coronary artery bypass graft    . Cholecystectomy    . Kidney surgery      kidney removed    There were no vitals filed for this visit.  Visit Diagnosis:  Abnormality of gait  Difficulty walking  Impaired mobility and activities of daily living  Leg weakness, bilateral      Subjective Assessment - 12/05/15 1301    Subjective No complaints of pain. Per wife, "He wants to use a walker to walk into a restaurant."    Currently in Pain? No/denies                         [facility] Adult PT Treatment/Exercise - 12/05/15 1251    Ambulation/Gait   Ambulation Distance (Feet) 99 Feet  over 5 bouts of gait training   Assistive device Rolling walker;Parallel bars   Gait Pattern  Step-to pattern   Ambulation Surface Level;Indoor   Gait Comments Pt requires Min assist or CGA with sit-stand from [place] to RW. He requires ongoing verbal cues for hand placement and sequencing for sit-stand and posture throughout. He requires constand verbal cues for trunk extension throughout gait. He requires close [facility] follow due to fatigue. Also requires cues to increase step length                  PT Short Term Goals - 11/28/15 1330    PT SHORT TERM GOAL #1   Title pt will be independent with intial HEP after review   Status Achieved   PT SHORT TERM GOAL #2   Title He will be able to walk in bars 100 feet or more with turning with only contact guard.   Status On-going   PT SHORT TERM GOAL #3   Title He will be able to roll on mat RT and LT  without asssitance   Status On-going   PT SHORT TERM GOAL #4   Title He will be able to go sit to stand with min assist to walker   Status Achieved           PT Long Term  Goals - 11/28/15 1338    PT LONG TERM GOAL #1   Title pt will perform sit to stand without assistance at his WC to walker   Status On-going   PT LONG TERM GOAL #2   Title pt will ambulate x 30 ft with RW CGA   Status Partially Met   PT LONG TERM GOAL #3   Title pt will tolerate standing activities in the clinic x 5 minutes to demonstrate improved endurance   Status On-going   PT LONG TERM GOAL #4   Title patient able to walk 50 feet with rolling walker and c.g with aide from [place] - 12/05/15 1257    Clinical Impression Statement Instructed pt in gait training in parallel bars as well as with RW. He will bring his RW next visit. He requires CGA at all times and intermittent Min Assist to rise from [facility] to RW. Pt fatigues quickly and requires constant verbal cues for safe sequencing with transfers and gait. Maximum gait today 73f with RW, CGA, and close follow with WC.    Clinical Impairments Affecting Rehab Potential  Pt with lumbar stenosis and deconditioning.    PT Next Visit Plan Continue gait with RW and transfers over remaining visits   PT Home Exercise Plan continue with HEP, sit-stands, standing tolerance, seated LE HEP   Consulted and Agree with Plan of Care Patient;Family member/caregiver   Family Member Consulted spouse        Problem List Patient Active Problem List   Diagnosis Date Noted  . Pressure ulcer 09/29/2015  . Altered mental status   . Acute encephalopathy 09/26/2015  . Chronic back pain 09/26/2015  . Generalized weakness 04/13/2015  . Lobar pneumonia due to unspecified organism 04/13/2015  . Prolonged QT interval   . Weakness 04/11/2015  . CAP (community acquired pneumonia) 01/25/2015  . Dehydration 01/25/2015  . Chronic diastolic heart failure ([place] 06/19/2014  . Essential hypertension, benign 03/08/2014  . Hypothyroidism 03/08/2014  . Protein-calorie malnutrition, moderate ([place] 03/08/2014  . Constipation 03/08/2014  . Physical deconditioning 03/08/2014  . Hypokalemia 02/27/2014  . Abnormal serum protein electrophoresis 01/07/2013  . Chronic kidney disease (CKD), stage III (moderate) 01/03/2013  . Polymyalgia rheumatica ([place] 01/03/2013  . Osteopenia 01/03/2013  . Carotid artery disease ([place] 01/03/2013  . Spinal stenosis of lumbar region 01/03/2013  . Coronary artery disease 01/03/2013  . Old MI (myocardial infarction) 01/03/2013  . Unspecified transient cerebral ischemia 11/18/2011    DDorene Ar PTA 12/05/2015, 1:03 PM  [facility] [address]Phone: [phone]  Fax:  [phone] Name: Barry TRAMELLMRN: [id number] of Birth: 826-Sep-1921

## 2015-12-07 ENCOUNTER — Telehealth: Payer: Self-pay | Admitting: Physical Therapy

## 2015-12-07 NOTE — Telephone Encounter (Signed)
[place] returned my call to discuss Barry Wright limited progress with PT and plans for DC. I relayed my concerns that patient and his wife strongly want for patient to ambulate in the community and at this time, it is not a safe option. PT plans to continue remaining 2-5 visits to try and progress to safe household ambulation. I shared with [place] the wife's and patient's frustrations regarding this news. I also shared the patient had verbalized wishes to continue towards his household ambulation goals for his remaining visits. [place] asked about HHPT and I explained that even with that he is really more appropriate for maintenance program that a caregiver could be trained in at this point. [place] was going to discuss with Dr. Felipa Eth. I advised her to call us with any questions or concerns and let us know if we could be of any assistance.

## 2015-12-10 ENCOUNTER — Ambulatory Visit: Payer: Medicare Other

## 2015-12-10 DIAGNOSIS — R262 Difficulty in walking, not elsewhere classified: Secondary | ICD-10-CM | POA: Diagnosis not present

## 2015-12-10 DIAGNOSIS — R269 Unspecified abnormalities of gait and mobility: Secondary | ICD-10-CM

## 2015-12-10 DIAGNOSIS — Z789 Other specified health status: Secondary | ICD-10-CM

## 2015-12-10 DIAGNOSIS — Z7409 Other reduced mobility: Secondary | ICD-10-CM | POA: Diagnosis not present

## 2015-12-10 DIAGNOSIS — R29898 Other symptoms and signs involving the musculoskeletal system: Secondary | ICD-10-CM | POA: Diagnosis not present

## 2015-12-10 NOTE — Therapy (Signed)
[place], [place], [address] Phone: [phone]   Fax:  [phone]  Physical Therapy Treatment  Patient Details  Name: Barry Wright MRN: [id number] Date of Birth: December 16, 1919 Referring Provider: Lajean Manes, MD  Encounter Date: 12/10/2015      PT End of Session - 12/10/15 1335    Visit Number 11   Number of Visits 14   Date for PT Re-Evaluation 12/21/15   PT Start Time 8756   PT Stop Time 1230   PT Time Calculation (min) 45 min   Equipment Utilized During Treatment Gait belt   Activity Tolerance Patient limited by fatigue   Behavior During Therapy [facility] for tasks assessed/performed      Past Medical History  Diagnosis Date  . Chronic back pain   . Hypercholesteremia   . Hypothyroidism   . CAD (coronary artery disease)   . Chronic kidney disease (CKD), stage III (moderate)   . Polymyalgia rheumatica ([place])   . Left bundle branch block   . Hx of CABG   . History of nephrectomy   . Internal carotid artery stenosis   . Myocardial infarction ([place])   . CHF (congestive heart failure) ([place])   . Stroke [facility] [place] [facility])     Past Surgical History  Procedure Laterality Date  . Coronary artery bypass graft    . Cholecystectomy    . Kidney surgery      kidney removed    There were no vitals filed for this visit.  Visit Diagnosis:  Abnormality of gait  Difficulty walking  Impaired mobility and activities of daily living  Leg weakness, bilateral      Subjective Assessment - 12/10/15 1330    Subjective Mr Barry Wright he is the same and will try to work to walk more. Improve strenght.  Spouse is asking to exercise more now saying he needs to be stronger to be able to walk   Currently in Pain? No/denies                         [facility] Adult PT Treatment/Exercise - 12/10/15 1331    Ambulation/Gait   Ambulation Distance (Feet) 100 Feet   Assistive device Rolling walker;Parallel bars   some step to with  encouragement   Gait Pattern Step-to pattern   Ambulation Surface Level;Indoor   Gait Comments He was able to walk 2x 2 trips in bars and once 3 trips in bar . He needed significant cues and encouragement to stay ersect and to push for 3 trips without stopping HR max 104 bpm.  in walker he did 15 feet then 40 feet x1 with RW.    Knee/Hip Exercises: Standing   Other Standing Knee Exercises WOrked on standing endurance and balance with alternating hand s off bar and both hands. He tended to collaps but wuith verbal encouragement he worked to stay erect.                     PT Short Term Goals - 12/10/15 1343    PT SHORT TERM GOAL #1   Title pt will be independent with intial HEP after review   Status Achieved   PT SHORT TERM GOAL #2   Title He will be able to walk in bars 100 feet or more with turning with only contact guard.   Status On-going   PT SHORT TERM GOAL #3   Title He will be able to  roll on mat RT and LT  without asssitance   Status On-going   PT SHORT TERM GOAL #4   Title He will be able to go sit to stand with min assist to walker   Status Achieved           PT Long Term Goals - 11/28/15 1338    PT LONG TERM GOAL #1   Title pt will perform sit to stand without assistance at his WC to walker   Status On-going   PT LONG TERM GOAL #2   Title pt will ambulate x 30 ft with RW CGA   Status Partially Met   PT LONG TERM GOAL #3   Title pt will tolerate standing activities in the clinic x 5 minutes to demonstrate improved endurance   Status On-going   PT LONG TERM GOAL #4   Title patient able to walk 50 feet with rolling walker and c.g with aide from [place]   Status On-going               Plan - 12/10/15 1338    Clinical Impression Statement Mr Stebner tolerated treatment and worked when pushed but fatigue and weakness limited tolerance /distance, ability to stand erect. Another long discussion by me and Dana Nicoletta PT  with Mrs Barry Wright  about progrees and how this dicates continuing PT and that we agreed on pushing walking and that she changed the agreement asking to not walk and to exercise more. We will stay with the original plan from last week and push walking trying to inc time/distance on feet  to justify more sessions.    Pt will benefit from skilled therapeutic intervention in order to improve on the following deficits Abnormal gait   PT Next Visit Plan Continue gait with RW and transfers over remaining visits working on turns and any standing exercise to improve strength and tolerance on feet.   Consulted and Agree with Plan of Care Patient;Family member/caregiver   Family Member Consulted spouse        Problem List Patient Active Problem List   Diagnosis Date Noted  . Pressure ulcer 09/29/2015  . Altered mental status   . Acute encephalopathy 09/26/2015  . Chronic back pain 09/26/2015  . Generalized weakness 04/13/2015  . Lobar pneumonia due to unspecified organism 04/13/2015  . Prolonged QT interval   . Weakness 04/11/2015  . CAP (community acquired pneumonia) 01/25/2015  . Dehydration 01/25/2015  . Chronic diastolic heart failure (HCC) 06/19/2014  . Essential hypertension, benign 03/08/2014  . Hypothyroidism 03/08/2014  . Protein-calorie malnutrition, moderate (HCC) 03/08/2014  . Constipation 03/08/2014  . Physical deconditioning 03/08/2014  . Hypokalemia 02/27/2014  . Abnormal serum protein electrophoresis 01/07/2013  . Chronic kidney disease (CKD), stage III (moderate) 01/03/2013  . Polymyalgia rheumatica (HCC) 01/03/2013  . Osteopenia 01/03/2013  . Carotid artery disease (HCC) 01/03/2013  . Spinal stenosis of lumbar region 01/03/2013  . Coronary artery disease 01/03/2013  . Old MI (myocardial infarction) 01/03/2013  . Unspecified transient cerebral ischemia 11/18/2011    ,  M PT 12/10/2015, 1:45 PM  [facility] [address] [place], [place], [address] Phone: [phone]   Fax:  [phone]  Name: Barry Wright MRN: [id number] Date of Birth: 05/22/1920     

## 2015-12-13 ENCOUNTER — Ambulatory Visit: Payer: Medicare Other | Attending: Geriatric Medicine | Admitting: Physical Therapy

## 2015-12-13 DIAGNOSIS — R269 Unspecified abnormalities of gait and mobility: Secondary | ICD-10-CM | POA: Diagnosis not present

## 2015-12-13 DIAGNOSIS — R29898 Other symptoms and signs involving the musculoskeletal system: Secondary | ICD-10-CM

## 2015-12-13 DIAGNOSIS — Z789 Other specified health status: Secondary | ICD-10-CM

## 2015-12-13 DIAGNOSIS — R262 Difficulty in walking, not elsewhere classified: Secondary | ICD-10-CM | POA: Insufficient documentation

## 2015-12-13 DIAGNOSIS — Z7409 Other reduced mobility: Secondary | ICD-10-CM | POA: Diagnosis not present

## 2015-12-13 NOTE — Therapy (Signed)
[place], [place], [address] Phone: [phone]   Fax:  [phone]  Physical Therapy Treatment  Patient Details  Name: Barry Wright MRN: [id number] Date of Birth: 1920/09/09 Referring Provider: Lajean Manes, MD  Encounter Date: 12/13/2015      PT End of Session - 12/13/15 1521    Visit Number 12   Number of Visits 14   Date for PT Re-Evaluation 12/21/15   Authorization Type KX modifier   PT Start Time 0940   PT Stop Time 1230   PT Time Calculation (min) 45 min      Past Medical History  Diagnosis Date  . Chronic back pain   . Hypercholesteremia   . Hypothyroidism   . CAD (coronary artery disease)   . Chronic kidney disease (CKD), stage III (moderate)   . Polymyalgia rheumatica ([place])   . Left bundle branch block   . Hx of CABG   . History of nephrectomy   . Internal carotid artery stenosis   . Myocardial infarction ([place])   . CHF (congestive heart failure) ([place])   . Stroke [facility])     Past Surgical History  Procedure Laterality Date  . Coronary artery bypass graft    . Cholecystectomy    . Kidney surgery      kidney removed    There were no vitals filed for this visit.  Visit Diagnosis:  Abnormality of gait  Difficulty walking  Impaired mobility and activities of daily living  Leg weakness, bilateral      Subjective Assessment - 12/13/15 1521    Subjective Wife asks if pt can do machines to strengthen his legs.    Currently in Pain? No/denies                         [facility] Adult PT Treatment/Exercise - 12/13/15 0001    Transfers   Transfers Stand Pivot Transfers   Stand Pivot Transfers 3: Mod assist   Comments Required mod assist for transfer from [facility] to Nustep - constant cues for sequencing    Ambulation/Gait   Ambulation Distance (Feet) 40 Feet   Assistive device Bilateral platform walker;Parallel bars   Gait Pattern Step-to pattern   Ambulation  Surface Indoor   Gait Comments He was able to walk down and back in parallel bars with 2 turns, then down again with 1 turn x 2 and requests to sit due to fatigue   Knee/Hip Exercises: Aerobic   Nustep Nustep Level 4 LE only x 5 min   Knee/Hip Exercises: Seated   Long Arc Quad Both;2 sets;10 reps   Long Arc Quad Weight 5 lbs.   Long [employer] Limitations lask full extension ROM on right   Sit to [employer] 10 reps;with UE support                  PT Short Term Goals - 12/10/15 1343    PT SHORT TERM GOAL #1   Title pt will be independent with intial HEP after review   Status Achieved   PT SHORT TERM GOAL #2   Title He will be able to walk in bars 100 feet or more with turning with only contact guard.   Status On-going   PT SHORT TERM GOAL #3   Title He will be able to roll on mat RT and LT  without asssitance   Status On-going   PT SHORT TERM GOAL #4  Title He will be able to go sit to stand with min assist to walker   Status Achieved           PT Long Term Goals - 11/28/15 1338    PT LONG TERM GOAL #1   Title pt will perform sit to stand without assistance at his WC to walker   Status On-going   PT LONG TERM GOAL #2   Title pt will ambulate x 30 ft with RW CGA   Status Partially Met   PT LONG TERM GOAL #3   Title pt will tolerate standing activities in the clinic x 5 minutes to demonstrate improved endurance   Status On-going   PT LONG TERM GOAL #4   Title patient able to walk 50 feet with rolling walker and c.g with aide from [place] - 12/13/15 1522    Clinical Impression Statement Mr Hutmacher was able to walk down and back with turns in parallel bars prior to sitting for rest break. Wife asks if the [place]ines would strengthen his legs so he can walk better. Worked on marching in parallel bars- very difficult to maintain posture and c/o right knee buckling with weightbearing RLE. Several trails of standing in bars with 1 UE  assist- constant cues for posture- denies pain, just fatigue. Seated LE strengthening including LAQ, sit-stands and Nustep. Unable to ambulate with RW after Nustep due to fatigue. Pt and wife seemed pleased with session.    Clinical Impairments Affecting Rehab Potential Pt with lumbar stenosis and deconditioning.    PT Next Visit Plan Continue gait with RW and transfers over remaining visits working on turns and any standing exercise to improve strength and tolerance on feet.        Problem List Patient Active Problem List   Diagnosis Date Noted  . Pressure ulcer 09/29/2015  . Altered mental status   . Acute encephalopathy 09/26/2015  . Chronic back pain 09/26/2015  . Generalized weakness 04/13/2015  . Lobar pneumonia due to unspecified organism 04/13/2015  . Prolonged QT interval   . Weakness 04/11/2015  . CAP (community acquired pneumonia) 01/25/2015  . Dehydration 01/25/2015  . Chronic diastolic heart failure ([place]) 06/19/2014  . Essential hypertension, benign 03/08/2014  . Hypothyroidism 03/08/2014  . Protein-calorie malnutrition, moderate ([place]) 03/08/2014  . Constipation 03/08/2014  . Physical deconditioning 03/08/2014  . Hypokalemia 02/27/2014  . Abnormal serum protein electrophoresis 01/07/2013  . Chronic kidney disease (CKD), stage III (moderate) 01/03/2013  . Polymyalgia rheumatica ([place]) 01/03/2013  . Osteopenia 01/03/2013  . Carotid artery disease ([place]) 01/03/2013  . Spinal stenosis of lumbar region 01/03/2013  . Coronary artery disease 01/03/2013  . Old MI (myocardial infarction) 01/03/2013  . Unspecified transient cerebral ischemia 11/18/2011    Dorene Ar, PTA 12/13/2015, 3:33 PM  [facility] [address] [place], [place], [address] Phone: [phone]   Fax:  [phone]  Name: Barry Wright MRN: [id number] Date of Birth: [birth year]

## 2015-12-17 ENCOUNTER — Ambulatory Visit: Payer: Medicare Other | Admitting: Physical Therapy

## 2015-12-17 DIAGNOSIS — R29898 Other symptoms and signs involving the musculoskeletal system: Secondary | ICD-10-CM | POA: Diagnosis not present

## 2015-12-17 DIAGNOSIS — Z7409 Other reduced mobility: Secondary | ICD-10-CM

## 2015-12-17 DIAGNOSIS — R269 Unspecified abnormalities of gait and mobility: Secondary | ICD-10-CM | POA: Diagnosis not present

## 2015-12-17 DIAGNOSIS — R262 Difficulty in walking, not elsewhere classified: Secondary | ICD-10-CM | POA: Diagnosis not present

## 2015-12-17 DIAGNOSIS — Z789 Other specified health status: Secondary | ICD-10-CM

## 2015-12-17 NOTE — Therapy (Signed)
[place], [place], [address] Phone: [phone]   Fax:  [phone]  Physical Therapy Treatment  Patient Details  Name: Barry Wright MRN: [id number] Date of Birth: Jun 03, 1920 Referring Provider: Lajean Manes, MD  Encounter Date: 12/17/2015      PT End of Session - 12/17/15 1323    Visit Number 13   Number of Visits 14   Date for PT Re-Evaluation 12/21/15   Authorization Type KX modifier   PT Start Time 1100   PT Stop Time 1145   PT Time Calculation (min) 45 min   Equipment Utilized During Treatment Gait belt   Activity Tolerance Patient limited by fatigue   Behavior During Therapy [facility] for tasks assessed/performed      Past Medical History  Diagnosis Date  . Chronic back pain   . Hypercholesteremia   . Hypothyroidism   . CAD (coronary artery disease)   . Chronic kidney disease (CKD), stage III (moderate)   . Polymyalgia rheumatica ([place])   . Left bundle branch block   . Hx of CABG   . History of nephrectomy   . Internal carotid artery stenosis   . Myocardial infarction ([place])   . CHF (congestive heart failure) ([place])   . Stroke [facility])     Past Surgical History  Procedure Laterality Date  . Coronary artery bypass graft    . Cholecystectomy    . Kidney surgery      kidney removed    There were no vitals filed for this visit.  Visit Diagnosis:  Abnormality of gait  Difficulty walking  Impaired mobility and activities of daily living  Leg weakness, bilateral      Subjective Assessment - 12/17/15 1324    Currently in Pain? No/denies                         [facility] Adult PT Treatment/Exercise - 12/17/15 0001    Ambulation/Gait   Ambulation Distance (Feet) 62 Feet   Assistive device Rolling walker;Parallel bars   Gait Pattern Step-to pattern   Ambulation Surface Indoor;Level   Pre-Gait Activities Standing tolerance in parallel bars 5 minutes    Gait Comments He  was able to make 5 passes in parallel bars including turn for a total of 50 feet in bars then 12 additional feet with RW later in session. With RW is c/o UE giving out.    Therapeutic Activites    Therapeutic Activities Other Therapeutic Activities   Knee/Hip Exercises: Aerobic   Nustep Nustep Level 4 LE only x 6 min   Knee/Hip Exercises: Seated   Long Arc Quad Both;10 reps;3 sets   [employer] Weight 5 lbs.   Long [employer] Limitations lacks full extension ROM on right   Knee/Hip Flexion 5# march 3 x 10 bilateral   Sit to [employer] 10 reps;with UE support                  PT Short Term Goals - 12/17/15 1321    PT SHORT TERM GOAL #1   Title pt will be independent with intial HEP after review   Time 4   Period Weeks   Status Achieved   PT SHORT TERM GOAL #2   Title He will be able to walk in bars 100 feet or more with turning with only contact guard.   Time 4   Period Weeks   Status On-going   PT SHORT  TERM GOAL #3   Title He will be able to roll on mat RT and LT  without asssitance   Time 4   Period Weeks   Status Unable to assess   PT SHORT TERM GOAL #4   Title He will be able to go sit to stand with min assist to walker   Time 4   Period Weeks   Status Achieved           PT Long Term Goals - 12/17/15 1322    PT LONG TERM GOAL #1   Title pt will perform sit to stand without assistance at his WC to walker   Baseline CGA   Time 8   Period Weeks   Status On-going   PT LONG TERM GOAL #2   Title pt will ambulate x 30 ft with RW CGA   Time 8   Period Weeks   Status Partially Met   PT LONG TERM GOAL #3   Title pt will tolerate standing activities in the clinic x 5 minutes to demonstrate improved endurance   Time 6   Period Weeks   Status Achieved   PT LONG TERM GOAL #4   Title patient able to walk 50 feet with rolling walker and c.g with aide from W/C   Time 8   Period Weeks   Status On-going               Plan - 12/17/15 1318    Clinical  Impression Statement Pt with improved endurance in parallel bars and able to make 5 passes including turns without seated rest break. Continued to worked on LE strength and standing tolerance. Able to stand 5 minutes in parallel with UE assist, LTG#3 MET.  He is CGA for all si-stand tranfers.     PT Next Visit Plan Continue gait with RW and transfers over remaining visits working on turns and any standing exercise to improve strength and tolerance on feet.        Problem List Patient Active Problem List   Diagnosis Date Noted  . Pressure ulcer 09/29/2015  . Altered mental status   . Acute encephalopathy 09/26/2015  . Chronic back pain 09/26/2015  . Generalized weakness 04/13/2015  . Lobar pneumonia due to unspecified organism 04/13/2015  . Prolonged QT interval   . Weakness 04/11/2015  . CAP (community acquired pneumonia) 01/25/2015  . Dehydration 01/25/2015  . Chronic diastolic heart failure ([place]) 06/19/2014  . Essential hypertension, benign 03/08/2014  . Hypothyroidism 03/08/2014  . Protein-calorie malnutrition, moderate ([place]) 03/08/2014  . Constipation 03/08/2014  . Physical deconditioning 03/08/2014  . Hypokalemia 02/27/2014  . Abnormal serum protein electrophoresis 01/07/2013  . Chronic kidney disease (CKD), stage III (moderate) 01/03/2013  . Polymyalgia rheumatica ([place]) 01/03/2013  . Osteopenia 01/03/2013  . Carotid artery disease ([place]) 01/03/2013  . Spinal stenosis of lumbar region 01/03/2013  . Coronary artery disease 01/03/2013  . Old MI (myocardial infarction) 01/03/2013  . Unspecified transient cerebral ischemia 11/18/2011    Dorene Ar, PTA 12/17/2015, 1:27 PM  [place], [place], [address] Phone: [phone]   Fax:  [phone]  Name: Barry Wright MRN: [id number] Date of Birth: Nov 01, 1920

## 2015-12-19 ENCOUNTER — Encounter: Payer: Medicare Other | Admitting: Physical Therapy

## 2015-12-20 ENCOUNTER — Ambulatory Visit: Payer: Medicare Other

## 2015-12-20 DIAGNOSIS — R269 Unspecified abnormalities of gait and mobility: Secondary | ICD-10-CM | POA: Diagnosis not present

## 2015-12-20 DIAGNOSIS — R29898 Other symptoms and signs involving the musculoskeletal system: Secondary | ICD-10-CM

## 2015-12-20 DIAGNOSIS — Z7409 Other reduced mobility: Secondary | ICD-10-CM | POA: Diagnosis not present

## 2015-12-20 DIAGNOSIS — Z789 Other specified health status: Secondary | ICD-10-CM

## 2015-12-20 DIAGNOSIS — R262 Difficulty in walking, not elsewhere classified: Secondary | ICD-10-CM

## 2015-12-20 NOTE — Therapy (Signed)
[place], [place], [address] Phone: [phone]   Fax:  [phone]  Physical Therapy Treatment  Patient Details  Name: Barry Wright MRN: [id number] Date of Birth: Feb 16, 1920 Referring Provider: Lajean Manes, MD  Encounter Date: 12/20/2015      PT End of Session - 12/20/15 1101    Visit Number 14   Number of Visits 20   Date for PT Re-Evaluation 01/10/16   PT Start Time 1020   PT Stop Time 1103   PT Time Calculation (min) 43 min   Activity Tolerance Patient tolerated treatment well;Patient limited by fatigue   Behavior During Therapy [facility] for tasks assessed/performed      Past Medical History  Diagnosis Date  . Chronic back pain   . Hypercholesteremia   . Hypothyroidism   . CAD (coronary artery disease)   . Chronic kidney disease (CKD), stage III (moderate)   . Polymyalgia rheumatica ([place])   . Left bundle branch block   . Hx of CABG   . History of nephrectomy   . Internal carotid artery stenosis   . Myocardial infarction ([place])   . CHF (congestive heart failure) ([place])   . Stroke [facility])     Past Surgical History  Procedure Laterality Date  . Coronary artery bypass graft    . Cholecystectomy    . Kidney surgery      kidney removed    There were no vitals filed for this visit.  Visit Diagnosis:  Abnormality of gait  Difficulty walking  Impaired mobility and activities of daily living  Leg weakness, bilateral      Subjective Assessment - 12/20/15 1058    Subjective He stated he would try to walk farther.    Currently in Pain? No/denies                         [facility] Adult PT Treatment/Exercise - 12/20/15 0001    Ambulation/Gait   Ambulation Distance (Feet) 60 Feet   Assistive device Rolling walker;Parallel bars   Gait Pattern Step-to pattern   Ambulation Surface Indoor;Level   Pre-Gait Activities Stood x 2 before walking.    Gait Comments He was able to  make 6  passes in parallel bars including turn for a total of 60 feet in bars then RW later in session.. HR  max 105 and was decr to 85 with 1-2 min rest.  This wa after walk in bars and with RW.    Lumbar Exercises: Aerobic   Stationary Bike Nustep L5 5 min LE only                  PT Short Term Goals - 12/17/15 1321    PT SHORT TERM GOAL #1   Title pt will be independent with intial HEP after review   Time 4   Period Weeks   Status Achieved   PT SHORT TERM GOAL #2   Title He will be able to walk in bars 100 feet or more with turning with only contact guard.   Time 4   Period Weeks   Status On-going   PT SHORT TERM GOAL #3   Title He will be able to roll on mat RT and LT  without asssitance   Time 4   Period Weeks   Status Unable to assess   PT SHORT TERM GOAL #4   Title He will be able to go sit to stand  with min assist to walker   Time 4   Period Weeks   Status Achieved           PT Long Term Goals - 12/17/15 1322    PT LONG TERM GOAL #1   Title pt will perform sit to stand without assistance at his WC to walker   Baseline CGA   Time 8   Period Weeks   Status On-going   PT LONG TERM GOAL #2   Title pt will ambulate x 30 ft with RW CGA   Time 8   Period Weeks   Status Partially Met   PT LONG TERM GOAL #3   Title pt will tolerate standing activities in the clinic x 5 minutes to demonstrate improved endurance   Time 6   Period Weeks   Status Achieved   PT LONG TERM GOAL #4   Title patient able to walk 50 feet with rolling walker and c.g with aide from W/C   Time 8   Period Weeks   Status On-going               Plan - 12/20/15 1102    Clinical Impression Statement He was able to walk farther with much verbal encouragement. Will start with RW next visit then go to bars  Monitor HR   PT Home Exercise Plan Cont gait and exercise , nustep   Consulted and Agree with Plan of Care Patient   Family Member Consulted spouse        Problem  List Patient Active Problem List   Diagnosis Date Noted  . Pressure ulcer 09/29/2015  . Altered mental status   . Acute encephalopathy 09/26/2015  . Chronic back pain 09/26/2015  . Generalized weakness 04/13/2015  . Lobar pneumonia due to unspecified organism 04/13/2015  . Prolonged QT interval   . Weakness 04/11/2015  . CAP (community acquired pneumonia) 01/25/2015  . Dehydration 01/25/2015  . Chronic diastolic heart failure ([place]) 06/19/2014  . Essential hypertension, benign 03/08/2014  . Hypothyroidism 03/08/2014  . Protein-calorie malnutrition, moderate ([place]) 03/08/2014  . Constipation 03/08/2014  . Physical deconditioning 03/08/2014  . Hypokalemia 02/27/2014  . Abnormal serum protein electrophoresis 01/07/2013  . Chronic kidney disease (CKD), stage III (moderate) 01/03/2013  . Polymyalgia rheumatica ([place]) 01/03/2013  . Osteopenia 01/03/2013  . Carotid artery disease ([place]) 01/03/2013  . Spinal stenosis of lumbar region 01/03/2013  . Coronary artery disease 01/03/2013  . Old MI (myocardial infarction) 01/03/2013  . Unspecified transient cerebral ischemia 11/18/2011    Barry Wright PT 12/20/2015, 11:05 AM  [facility] [address] [place], [place], [address] Phone: [phone]   Fax:  [phone]  Name: Barry Wright MRN: [id number] Date of Birth: [birth year]

## 2015-12-25 ENCOUNTER — Ambulatory Visit: Payer: Medicare Other

## 2015-12-25 DIAGNOSIS — Z7409 Other reduced mobility: Secondary | ICD-10-CM

## 2015-12-25 DIAGNOSIS — R29898 Other symptoms and signs involving the musculoskeletal system: Secondary | ICD-10-CM | POA: Diagnosis not present

## 2015-12-25 DIAGNOSIS — R262 Difficulty in walking, not elsewhere classified: Secondary | ICD-10-CM

## 2015-12-25 DIAGNOSIS — R269 Unspecified abnormalities of gait and mobility: Secondary | ICD-10-CM | POA: Diagnosis not present

## 2015-12-25 DIAGNOSIS — Z789 Other specified health status: Secondary | ICD-10-CM

## 2015-12-25 NOTE — Therapy (Signed)
[place], [place], [address] Phone: [phone]   Fax:  [phone]  Physical Therapy Treatment  Patient Details  Name: Barry Wright MRN: [id number] Date of Birth: [birth year] Referring Provider: Lajean Manes, MD  Encounter Date: 12/25/2015      PT End of Session - 12/25/15 1204    Visit Number 15   Number of Visits 20   Date for PT Re-Evaluation 01/10/16   PT Start Time 1100   PT Stop Time 1157   PT Time Calculation (min) 57 min   Equipment Utilized During Treatment Gait belt   Activity Tolerance Patient tolerated treatment well;Patient limited by fatigue   Behavior During Therapy [facility] for tasks assessed/performed      Past Medical History  Diagnosis Date  . Chronic back pain   . Hypercholesteremia   . Hypothyroidism   . CAD (coronary artery disease)   . Chronic kidney disease (CKD), stage III (moderate)   . Polymyalgia rheumatica ([place])   . Left bundle branch block   . Hx of CABG   . History of nephrectomy   . Internal carotid artery stenosis   . Myocardial infarction ([place])   . CHF (congestive heart failure) ([place])   . Stroke [facility])     Past Surgical History  Procedure Laterality Date  . Coronary artery bypass graft    . Cholecystectomy    . Kidney surgery      kidney removed    There were no vitals filed for this visit.  Visit Diagnosis:  Abnormality of gait  Difficulty walking  Impaired mobility and activities of daily living  Leg weakness, bilateral      Subjective Assessment - 12/25/15 1106    Subjective No complaints except some horsness in throat   Currently in Pain? No/denies                         [facility] Adult PT Treatment/Exercise - 12/25/15 1125    Ambulation/Gait   Gait Comments HR 72 at rest.  walked in bars 20 feet with HR 88 bpm. Rested 2 min awith HR 76 and walked 30 feet with RW  with HR 100 post. He reported fatigue so stopped.  Rested 2  min with HR  72 then walked 60 feet with verbal encouragement  with HR 104 then walked again 64  feet with starting HR 76  and finish HR  115 . Rested 3 min then did Nustep LE only x 6 min L5.  after rest starting HR 88 bpm. After  6 min HR 100 BPM                    PT Short Term Goals - 12/17/15 1321    PT SHORT TERM GOAL #1   Title pt will be independent with intial HEP after review   Time 4   Period Weeks   Status Achieved   PT SHORT TERM GOAL #2   Title He will be able to walk in bars 100 feet or more with turning with only contact guard.   Time 4   Period Weeks   Status On-going   PT SHORT TERM GOAL #3   Title He will be able to roll on mat RT and LT  without asssitance   Time 4   Period Weeks   Status Unable to assess   PT SHORT TERM GOAL #4   Title He will be  able to go sit to stand with min assist to walker   Time 4   Period Weeks   Status Achieved           PT Long Term Goals - 12/25/15 1208    PT LONG TERM GOAL #1   Title pt will perform sit to stand without assistance at his WC to walker   Baseline Need close supervision  and I was getting hold of belt for guard with ambulation   Status Partially Met   PT LONG TERM GOAL #2   Title pt will ambulate x 30 ft with RW CGA   Status Achieved   PT LONG TERM GOAL #4   Title patient able to walk 50 feet with rolling walker and c.g with aide from [place] - 12/25/15 1204    Clinical Impression Statement Hewas able to walk  longer doing 2x 60 feet walks. Turning is a significant problem. He continues to need verbal encouragement to walk this distance. Will try back in bars next visit to see how far he is able to walk with turns.  If able to walk  60 feet with only supervision and turnig to safely sit in chair  he may be able to ambulate in home safely with aide.    PT Home Exercise Plan Cont gait and exercise , nustep  ask how large his home is  to guage walking need in  home. Try  standing tolerance        Problem List Patient Active Problem List   Diagnosis Date Noted  . Pressure ulcer 09/29/2015  . Altered mental status   . Acute encephalopathy 09/26/2015  . Chronic back pain 09/26/2015  . Generalized weakness 04/13/2015  . Lobar pneumonia due to unspecified organism 04/13/2015  . Prolonged QT interval   . Weakness 04/11/2015  . CAP (community acquired pneumonia) 01/25/2015  . Dehydration 01/25/2015  . Chronic diastolic heart failure ([place]) 06/19/2014  . Essential hypertension, benign 03/08/2014  . Hypothyroidism 03/08/2014  . Protein-calorie malnutrition, moderate ([place]) 03/08/2014  . Constipation 03/08/2014  . Physical deconditioning 03/08/2014  . Hypokalemia 02/27/2014  . Abnormal serum protein electrophoresis 01/07/2013  . Chronic kidney disease (CKD), stage III (moderate) 01/03/2013  . Polymyalgia rheumatica ([place]) 01/03/2013  . Osteopenia 01/03/2013  . Carotid artery disease ([place]) 01/03/2013  . Spinal stenosis of lumbar region 01/03/2013  . Coronary artery disease 01/03/2013  . Old MI (myocardial infarction) 01/03/2013  . Unspecified transient cerebral ischemia 11/18/2011    [name] PT 12/25/2015, 12:11 PM  [facility] [address] [place], [place], [address] Phone: [phone]   Fax:  [phone]  Name: Barry Wright MRN: [id number] Date of Birth: [birth year]

## 2015-12-27 ENCOUNTER — Ambulatory Visit: Payer: Medicare Other

## 2015-12-27 DIAGNOSIS — R29898 Other symptoms and signs involving the musculoskeletal system: Secondary | ICD-10-CM

## 2015-12-27 DIAGNOSIS — Z7409 Other reduced mobility: Secondary | ICD-10-CM

## 2015-12-27 DIAGNOSIS — R269 Unspecified abnormalities of gait and mobility: Secondary | ICD-10-CM | POA: Diagnosis not present

## 2015-12-27 DIAGNOSIS — R262 Difficulty in walking, not elsewhere classified: Secondary | ICD-10-CM

## 2015-12-27 NOTE — Therapy (Signed)
[place], [place], [address] Phone: [phone]   Fax:  [phone]  Physical Therapy Treatment  Patient Details  Name: BRAXTYN BOJARSKI MRN: [id number] Date of Birth: [birth year] Referring Provider: Lajean Manes, MD  Encounter Date: 12/27/2015      PT End of Session - 12/27/15 1149    Visit Number 16   Number of Visits 20   Date for PT Re-Evaluation 01/10/16   PT Start Time 1100   PT Stop Time 1143   PT Time Calculation (min) 43 min   Equipment Utilized During Treatment Gait belt   Activity Tolerance Patient tolerated treatment well;Patient limited by fatigue   Behavior During Therapy [facility] for tasks assessed/performed      Past Medical History  Diagnosis Date  . Chronic back pain   . Hypercholesteremia   . Hypothyroidism   . CAD (coronary artery disease)   . Chronic kidney disease (CKD), stage III (moderate)   . Polymyalgia rheumatica ([place])   . Left bundle branch block   . Hx of CABG   . History of nephrectomy   . Internal carotid artery stenosis   . Myocardial infarction ([place])   . CHF (congestive heart failure) ([place])   . Stroke [facility])     Past Surgical History  Procedure Laterality Date  . Coronary artery bypass graft    . Cholecystectomy    . Kidney surgery      kidney removed    There were no vitals filed for this visit.  Visit Diagnosis:  Abnormality of gait  Difficulty walking  Impaired mobility and activities of daily living  Leg weakness, bilateral      Subjective Assessment - 12/27/15 1144    Subjective Didn't sleep well as I was cold so feel tired and joints don't feel so good   Currently in Pain? No/denies   Multiple Pain Sites No                         [facility] Adult PT Treatment/Exercise - 12/27/15 0001    Ambulation/Gait   Pre-Gait Activities Standing with CGA in standing without holding on to vars with verbal cues for posture and LE extension.  n bars   Gait Comments Walked in  20 feet in bars and after 3-4 min rest walked in bars 40 feet x 3 . He was limited due to fatigue.      Worked on strength and balance in standing without holding to bars           PT Education - 12/27/15 1148    Education provided Yes   Education Details TOday another encounter with Mrs Nasworthy about progress , prognosis and what we were doing and not doing in PT  for Mr Roggenkamp.    Person(s) Educated Patient;Spouse   Methods Explanation   Comprehension Verbalized understanding          PT Short Term Goals - 12/27/15 1225    PT SHORT TERM GOAL #1   Title pt will be independent with intial HEP after review   Status Achieved   PT SHORT TERM GOAL #2   Title He will be able to walk in bars 100 feet or more with turning with only contact guard.   Status On-going   PT SHORT TERM GOAL #3   Title He will be able to roll on mat RT and LT  without asssitance   Status Unable to assess  PT SHORT TERM GOAL #4   Title He will be able to go sit to stand with min assist to walker   Status Achieved           PT Long Term Goals - 12/25/15 1208    PT LONG TERM GOAL #1   Title pt will perform sit to stand without assistance at his WC to walker   Baseline Need close supervision  and I was getting hold of belt for guard with ambulation   Status Partially Met   PT LONG TERM GOAL #2   Title pt will ambulate x 30 ft with RW CGA   Status Achieved   PT LONG TERM GOAL #4   Title patient able to walk 50 feet with rolling walker and c.g with aide from [place] - 12/27/15 1223    Clinical Impression Statement Mr Nafziger was not able to walk as far today as last visit probably due to his report of fatigue. We will return to walker next session and continue exercise /balance static and nustep if time.   PT Home Exercise Plan Cont gait and exercise , nustep  ask how large his home is  to guage walking need in home. Try   standing tolerance   Consulted and Agree with Plan of Care Patient   Family Member Consulted spouse        Problem List Patient Active Problem List   Diagnosis Date Noted  . Pressure ulcer 09/29/2015  . Altered mental status   . Acute encephalopathy 09/26/2015  . Chronic back pain 09/26/2015  . Generalized weakness 04/13/2015  . Lobar pneumonia due to unspecified organism 04/13/2015  . Prolonged QT interval   . Weakness 04/11/2015  . CAP (community acquired pneumonia) 01/25/2015  . Dehydration 01/25/2015  . Chronic diastolic heart failure ([place]) 06/19/2014  . Essential hypertension, benign 03/08/2014  . Hypothyroidism 03/08/2014  . Protein-calorie malnutrition, moderate ([place]) 03/08/2014  . Constipation 03/08/2014  . Physical deconditioning 03/08/2014  . Hypokalemia 02/27/2014  . Abnormal serum protein electrophoresis 01/07/2013  . Chronic kidney disease (CKD), stage III (moderate) 01/03/2013  . Polymyalgia rheumatica ([place]) 01/03/2013  . Osteopenia 01/03/2013  . Carotid artery disease ([place]) 01/03/2013  . Spinal stenosis of lumbar region 01/03/2013  . Coronary artery disease 01/03/2013  . Old MI (myocardial infarction) 01/03/2013  . Unspecified transient cerebral ischemia 11/18/2011    [name] PT 12/27/2015, 12:28 PM  [facility] [address] [place], [place], [address] Phone: [phone]   Fax:  [phone]  Name: BENANCIO OSMUNDSON MRN: [id number] Date of Birth: 1919/12/02

## 2015-12-31 ENCOUNTER — Ambulatory Visit: Payer: Medicare Other

## 2015-12-31 DIAGNOSIS — Z7409 Other reduced mobility: Secondary | ICD-10-CM | POA: Diagnosis not present

## 2015-12-31 DIAGNOSIS — R262 Difficulty in walking, not elsewhere classified: Secondary | ICD-10-CM | POA: Diagnosis not present

## 2015-12-31 DIAGNOSIS — R269 Unspecified abnormalities of gait and mobility: Secondary | ICD-10-CM | POA: Diagnosis not present

## 2015-12-31 DIAGNOSIS — R29898 Other symptoms and signs involving the musculoskeletal system: Secondary | ICD-10-CM | POA: Diagnosis not present

## 2015-12-31 NOTE — Therapy (Signed)
[place], [place], [address] Phone: [phone]   Fax:  [phone]  Physical Therapy Treatment  Patient Details  Name: Barry Wright MRN: [id number] Date of Birth: 1920-07-07 Referring Provider: Lajean Manes, MD  Encounter Date: 12/31/2015      PT End of Session - 12/31/15 1245    Visit Number 17   Number of Visits 20   Date for PT Re-Evaluation 01/10/16   PT Start Time 1150   PT Stop Time 1233   PT Time Calculation (min) 43 min   Equipment Utilized During Treatment Gait belt   Activity Tolerance Patient tolerated treatment well   Behavior During Therapy [facility] for tasks assessed/performed      Past Medical History  Diagnosis Date  . Chronic back pain   . Hypercholesteremia   . Hypothyroidism   . CAD (coronary artery disease)   . Chronic kidney disease (CKD), stage III (moderate)   . Polymyalgia rheumatica ([place])   . Left bundle branch block   . Hx of CABG   . History of nephrectomy   . Internal carotid artery stenosis   . Myocardial infarction ([place])   . CHF (congestive heart failure) ([place])   . Stroke [facility])     Past Surgical History  Procedure Laterality Date  . Coronary artery bypass graft    . Cholecystectomy    . Kidney surgery      kidney removed    There were no vitals filed for this visit.  Visit Diagnosis:  Abnormality of gait  Difficulty walking  Impaired mobility and activities of daily living  Leg weakness, bilateral      Subjective Assessment - 12/31/15 1250    Subjective Feel Ok. Caregivers did not exercise with me over the week end.    Currently in Pain? No/denies                         [facility] Adult PT Treatment/Exercise - 12/31/15 1203    Ambulation/Gait   Assistive device Rolling walker;Parallel bars   Ambulation Surface Level;Indoor   Gait Comments Walked in  20 feet in bars and after 1 min rest walked with RW 85 feet with HR at 104.  After rest 3-4 min HR 80 and walked 50 feet  with HR 100 and after 3 min  rest HR 76. He walked 20 feet and sat in chair.  He needs +1 mod assist to turn and sit.He was limited due to fatigue.  We discussed size of home and distance walked today should get him from on side of home to other. They also have a porch he could walk in.  I explained need to do something more functional or to walk in home for exercise  to demo progress with functional changes to allow more PT sessions. I explained he needed to be able to turn and sit in chair safely and rise from chair safely without risking caregiver injury.                  PT Education - 12/31/15 1244    Education provided Yes   Education Details Need for functional changes at home with mobility to justify more PT session   Person(s) Educated Patient;Spouse;Caregiver(s)   Methods Explanation   Comprehension Verbalized understanding          PT Short Term Goals - 12/27/15 1225    PT SHORT TERM GOAL #1   Title  pt will be independent with intial HEP after review   Status Achieved   PT SHORT TERM GOAL #2   Title He will be able to walk in bars 100 feet or more with turning with only contact guard.   Status On-going   PT SHORT TERM GOAL #3   Title He will be able to roll on mat RT and LT  without asssitance   Status Unable to assess   PT SHORT TERM GOAL #4   Title He will be able to go sit to stand with min assist to walker   Status Achieved           PT Long Term Goals - 12/25/15 1208    PT LONG TERM GOAL #1   Title pt will perform sit to stand without assistance at his WC to walker   Baseline Need close supervision  and I was getting hold of belt for guard with ambulation   Status Partially Met   PT LONG TERM GOAL #2   Title pt will ambulate x 30 ft with RW CGA   Status Achieved   PT LONG TERM GOAL #4   Title patient able to walk 50 feet with rolling walker and c.g with aide from [place] - 12/31/15 1246    Clinical Impression Statement Significant improvement with ambulation distance /edurance and distance should be functional for walking in home . He reported wanted to walk into church and this distance is longer than he did today.   He needs to be able to turn to sit to be a functional ambulator. though he will most likely always need close contact guard.    PT Home Exercise Plan Cont gait and exercise , Try  standing tolerance, work on turning  to sit to LT and to RT. Address goals   Consulted and Agree with Plan of Care Patient;Family member/caregiver   Family Member Consulted spouse        Problem List Patient Active Problem List   Diagnosis Date Noted  . Pressure ulcer 09/29/2015  . Altered mental status   . Acute encephalopathy 09/26/2015  . Chronic back pain 09/26/2015  . Generalized weakness 04/13/2015  . Lobar pneumonia due to unspecified organism 04/13/2015  . Prolonged QT interval   . Weakness 04/11/2015  . CAP (community acquired pneumonia) 01/25/2015  . Dehydration 01/25/2015  . Chronic diastolic heart failure ([place]) 06/19/2014  . Essential hypertension, benign 03/08/2014  . Hypothyroidism 03/08/2014  . Protein-calorie malnutrition, moderate ([place]) 03/08/2014  . Constipation 03/08/2014  . Physical deconditioning 03/08/2014  . Hypokalemia 02/27/2014  . Abnormal serum protein electrophoresis 01/07/2013  . Chronic kidney disease (CKD), stage III (moderate) 01/03/2013  . Polymyalgia rheumatica ([place]) 01/03/2013  . Osteopenia 01/03/2013  . Carotid artery disease ([place]) 01/03/2013  . Spinal stenosis of lumbar region 01/03/2013  . Coronary artery disease 01/03/2013  . Old MI (myocardial infarction) 01/03/2013  . Unspecified transient cerebral ischemia 11/18/2011    [name] PT 12/31/2015, 12:53 PM  [facility] [address] [place], [place], [address] Phone: [phone]   Fax:   [phone]  Name: Barry Wright MRN: [id number] Date of Birth: Feb 08, 1920

## 2016-01-04 ENCOUNTER — Ambulatory Visit: Payer: Medicare Other

## 2016-01-04 DIAGNOSIS — R262 Difficulty in walking, not elsewhere classified: Secondary | ICD-10-CM | POA: Diagnosis not present

## 2016-01-04 DIAGNOSIS — R269 Unspecified abnormalities of gait and mobility: Secondary | ICD-10-CM

## 2016-01-04 DIAGNOSIS — Z7409 Other reduced mobility: Secondary | ICD-10-CM | POA: Diagnosis not present

## 2016-01-04 DIAGNOSIS — R29898 Other symptoms and signs involving the musculoskeletal system: Secondary | ICD-10-CM

## 2016-01-04 NOTE — Therapy (Signed)
[place], [place], [address] Phone: [phone]   Fax:  [phone]  Physical Therapy Treatment  Patient Details  Name: Barry Wright MRN: [id number] Date of Birth: 06-05-1920 Referring Provider: Lajean Manes, MD  Encounter Date: 01/04/2016      PT End of Session - 01/04/16 1251    Visit Number 18   Number of Visits 20   Date for PT Re-Evaluation 01/10/16   PT Start Time 1106   PT Stop Time 1205   PT Time Calculation (min) 59 min   Equipment Utilized During Treatment Gait belt   Activity Tolerance Patient tolerated treatment well   Behavior During Therapy [facility] for tasks assessed/performed      Past Medical History  Diagnosis Date  . Chronic back pain   . Hypercholesteremia   . Hypothyroidism   . CAD (coronary artery disease)   . Chronic kidney disease (CKD), stage III (moderate)   . Polymyalgia rheumatica ([place])   . Left bundle branch block   . Hx of CABG   . History of nephrectomy   . Internal carotid artery stenosis   . Myocardial infarction ([place])   . CHF (congestive heart failure) ([place])   . Stroke [facility])     Past Surgical History  Procedure Laterality Date  . Coronary artery bypass graft    . Cholecystectomy    . Kidney surgery      kidney removed    There were no vitals filed for this visit.  Visit Diagnosis:  Abnormality of gait  Difficulty walking  Impaired mobility and activities of daily living  Leg weakness, bilateral      Subjective Assessment - 01/04/16 1238    Subjective No complaints today. No pain   Patient is accompained by: Family member  caregiver   Currently in Pain? No/denies                         [facility] Adult PT Treatment/Exercise - 01/04/16 1250    Knee/Hip Exercises: Aerobic   Stepper L4 LE only 6 min HR 88 post     He walked 20 feet in bars  To warm up then walked with verbal cues for posture and step length 50 feet. After  this we worked on stand pivot transfers with RW and +1 CG to min assist with verbal cues for posture and complete turning with transfers. After this we stood at wall with hands on wall with +1 assist minimal  As long as tolerated  (60 sec or less) 4 attempts with rest as needed due to fatigue.             PT Short Term Goals - 12/27/15 1225    PT SHORT TERM GOAL #1   Title pt will be independent with intial HEP after review   Status Achieved   PT SHORT TERM GOAL #2   Title He will be able to walk in bars 100 feet or more with turning with only contact guard.   Status On-going   PT SHORT TERM GOAL #3   Title He will be able to roll on mat RT and LT  without asssitance   Status Unable to assess   PT SHORT TERM GOAL #4   Title He will be able to go sit to stand with min assist to walker   Status Achieved           PT Long Term Goals -  01/04/16 1245    PT LONG TERM GOAL #1   Title pt will perform sit to stand without assistance at his WC to walker   Status Achieved   PT LONG TERM GOAL #2   Title pt will ambulate x 30 ft with RW CGA   Status Achieved   PT LONG TERM GOAL #5   Title He will be able to transfer  with RW and pivot fully before sitting with min to no verbal cues.   Time 4   Period Weeks   Status New   Additional Long Term Goals   Additional Long Term Goals Yes   PT LONG TERM GOAL #6   Title He will be able to transfer with pivot correctly to chair after walking 50 feet with walker. after turning to walk back at 50 feet.    Time 4   Period Weeks   Status New               Plan - 01/04/16 1239    Clinical Impression Statement He was able to do pivot transfer from [facility] to firm chair with +1 min assist. He was able to maitain standing with hands on wall with only short times of a min or less. His HR never exceeded  108 bpm. As he has walked farther na did better with pivot transfers ti appears appropriate to continue PT with emphasis on standing and  functional ambulation /transfers for eventual doing this at home with caregiver. The patient , spouse caregiver and nurse from the caregiver agency were all present when I spoke to them about need for functional transferance of these activity into the home to justify continued PT   PT Next Visit Plan Continue gait with RW and transfers over remaining visits working on turns and any standing exercise to improve strength and tolerance on feet.   Consulted and Agree with Plan of Care Family member/caregiver  caregivers, agency nurse   Family Member Consulted spouse        Problem List Patient Active Problem List   Diagnosis Date Noted  . Pressure ulcer 09/29/2015  . Altered mental status   . Acute encephalopathy 09/26/2015  . Chronic back pain 09/26/2015  . Generalized weakness 04/13/2015  . Lobar pneumonia due to unspecified organism 04/13/2015  . Prolonged QT interval   . Weakness 04/11/2015  . CAP (community acquired pneumonia) 01/25/2015  . Dehydration 01/25/2015  . Chronic diastolic heart failure ([place]) 06/19/2014  . Essential hypertension, benign 03/08/2014  . Hypothyroidism 03/08/2014  . Protein-calorie malnutrition, moderate ([place]) 03/08/2014  . Constipation 03/08/2014  . Physical deconditioning 03/08/2014  . Hypokalemia 02/27/2014  . Abnormal serum protein electrophoresis 01/07/2013  . Chronic kidney disease (CKD), stage III (moderate) 01/03/2013  . Polymyalgia rheumatica ([place]) 01/03/2013  . Osteopenia 01/03/2013  . Carotid artery disease ([place]) 01/03/2013  . Spinal stenosis of lumbar region 01/03/2013  . Coronary artery disease 01/03/2013  . Old MI (myocardial infarction) 01/03/2013  . Unspecified transient cerebral ischemia 11/18/2011    [name] PT 01/04/2016, 12:52 PM  [facility] [address] [place], [place], [address] Phone: [phone]   Fax:  [phone]  Name: ELCHONON SEDIVY MRN:  [id number] Date of Birth: [birth year]

## 2016-01-08 ENCOUNTER — Ambulatory Visit: Payer: Medicare Other

## 2016-01-09 ENCOUNTER — Ambulatory Visit: Payer: Medicare Other | Admitting: Physical Therapy

## 2016-01-10 ENCOUNTER — Ambulatory Visit: Payer: Medicare Other | Attending: Geriatric Medicine

## 2016-01-10 DIAGNOSIS — Z7409 Other reduced mobility: Secondary | ICD-10-CM | POA: Diagnosis not present

## 2016-01-10 DIAGNOSIS — R29898 Other symptoms and signs involving the musculoskeletal system: Secondary | ICD-10-CM | POA: Insufficient documentation

## 2016-01-10 DIAGNOSIS — R262 Difficulty in walking, not elsewhere classified: Secondary | ICD-10-CM | POA: Diagnosis not present

## 2016-01-10 DIAGNOSIS — R269 Unspecified abnormalities of gait and mobility: Secondary | ICD-10-CM | POA: Diagnosis not present

## 2016-01-10 NOTE — Patient Instructions (Signed)
Discussed possibility of working at home in bed for body work for strength . Caregivers may not be able to do this safely.

## 2016-01-10 NOTE — Therapy (Signed)
[place], [place], [address] Phone: [phone]   Fax:  [phone]  Physical Therapy Treatment  Patient Details  Name: Barry Wright MRN: [id number] Date of Birth: 10/29/1920 Referring Provider: Lajean Manes, MD  Encounter Date: 01/10/2016      PT End of Session - 01/10/16 1226    Visit Number 19   Number of Visits 20   Date for PT Re-Evaluation 01/10/16   PT Start Time 1102   PT Stop Time 1146   PT Time Calculation (min) 44 min   Activity Tolerance Patient tolerated treatment well   Behavior During Therapy [facility] for tasks assessed/performed      Past Medical History  Diagnosis Date  . Chronic back pain   . Hypercholesteremia   . Hypothyroidism   . CAD (coronary artery disease)   . Chronic kidney disease (CKD), stage III (moderate)   . Polymyalgia rheumatica ([place])   . Left bundle branch block   . Hx of CABG   . History of nephrectomy   . Internal carotid artery stenosis   . Myocardial infarction ([place])   . CHF (congestive heart failure) ([place])   . Stroke [facility])     Past Surgical History  Procedure Laterality Date  . Coronary artery bypass graft    . Cholecystectomy    . Kidney surgery      kidney removed    There were no vitals filed for this visit.  Visit Diagnosis:  Abnormality of gait  Difficulty walking  Impaired mobility and activities of daily living  Leg weakness, bilateral      Subjective Assessment - 01/10/16 1104    Subjective No complaints. Nothing different.    Currently in Pain? No/denies                         [facility] Adult PT Treatment/Exercise - 01/10/16 1111    Ambulation/Gait   Gait Comments Walked 20 feet in bars then walked 50 feet to mat and worked on transition on off mat and rolling on mat . After he walked 30 feet ad pivoted to chair and after rest walked 20 feet and asked to stop due to arm fatigue.  Max HR 100 BPM   Therapeutic  Activites    Therapeutic Activities Other Therapeutic Activities   Other Therapeutic Activities We worked  on rotation on mat LT and RT and to go sit to supine with assist for legs and supine to sit with asssist to initiate push of trunk but he did Ind after initial assist.    OTHER   Stand Pivot Transfer Details Verbal cues for technique;Verbal cues for sequencing;Verbal cues for precautions/safety;Verbal cues for safe use of DME/AE  mat table to /from WC x 3                   PT Short Term Goals - 01/10/16 1228    PT SHORT TERM GOAL #1   Title pt will be independent with intial HEP after review   Status Achieved   PT SHORT TERM GOAL #2   Title He will be able to walk in bars 100 feet or more with turning with only contact guard.   Status On-going   PT SHORT TERM GOAL #3   Title He will be able to roll on mat RT and LT  without asssitance   Baseline Much verbal cuing and a small amount of tactile cuing but no  assist with rolling   Status Partially Met   PT SHORT TERM GOAL #4   Title He will be able to go sit to stand with min assist to walker   Status Achieved           PT Long Term Goals - 01/04/16 1245    PT LONG TERM GOAL #1   Title pt will perform sit to stand without assistance at his WC to walker   Status Achieved   PT LONG TERM GOAL #2   Title pt will ambulate x 30 ft with RW CGA   Status Achieved   PT LONG TERM GOAL #5   Title He will be able to transfer  with RW and pivot fully before sitting with min to no verbal cues.   Time 4   Period Weeks   Status New   Additional Long Term Goals   Additional Long Term Goals Yes   PT LONG TERM GOAL #6   Title He will be able to transfer with pivot correctly to chair after walking 50 feet with walker. after turning to walk back at 50 feet.    Time 4   Period Weeks   Status New               Plan - 01/10/16 1226    Clinical Impression Statement Arm fatigue stopped walking today. He was able to roll but  with constant cuing for technique and encouragement to work rotation body parts.  He did well with pivot transfer during practiv=ce but as fatigued he needed assist to turn and sit to chair.  He does have some RT knee flexion contracture but I was able to get spine mostly straight in supine though he needed 2 pillows for neck comfort   PT Next Visit Plan Continue gait with RW and transfers over remaining visits working on turns and any standing exercise to improve strength and tolerance on feet.   Consulted and Agree with Plan of Care Patient   Family Member Consulted spouse        Problem List Patient Active Problem List   Diagnosis Date Noted  . Pressure ulcer 09/29/2015  . Altered mental status   . Acute encephalopathy 09/26/2015  . Chronic back pain 09/26/2015  . Generalized weakness 04/13/2015  . Lobar pneumonia due to unspecified organism 04/13/2015  . Prolonged QT interval   . Weakness 04/11/2015  . CAP (community acquired pneumonia) 01/25/2015  . Dehydration 01/25/2015  . Chronic diastolic heart failure ([place]) 06/19/2014  . Essential hypertension, benign 03/08/2014  . Hypothyroidism 03/08/2014  . Protein-calorie malnutrition, moderate ([place]) 03/08/2014  . Constipation 03/08/2014  . Physical deconditioning 03/08/2014  . Hypokalemia 02/27/2014  . Abnormal serum protein electrophoresis 01/07/2013  . Chronic kidney disease (CKD), stage III (moderate) 01/03/2013  . Polymyalgia rheumatica ([place]) 01/03/2013  . Osteopenia 01/03/2013  . Carotid artery disease ([place]) 01/03/2013  . Spinal stenosis of lumbar region 01/03/2013  . Coronary artery disease 01/03/2013  . Old MI (myocardial infarction) 01/03/2013  . Unspecified transient cerebral ischemia 11/18/2011    [name] PT 01/10/2016, 12:31 PM  [facility] [address] [place], [place], [address] Phone: [phone]   Fax:  [phone]  Name: Barry Wright MRN:  [id number] Date of Birth: September 04, 1920

## 2016-01-14 ENCOUNTER — Ambulatory Visit: Payer: Medicare Other

## 2016-01-14 DIAGNOSIS — R29898 Other symptoms and signs involving the musculoskeletal system: Secondary | ICD-10-CM | POA: Diagnosis not present

## 2016-01-14 DIAGNOSIS — R262 Difficulty in walking, not elsewhere classified: Secondary | ICD-10-CM

## 2016-01-14 DIAGNOSIS — Z7409 Other reduced mobility: Secondary | ICD-10-CM | POA: Diagnosis not present

## 2016-01-14 DIAGNOSIS — R269 Unspecified abnormalities of gait and mobility: Secondary | ICD-10-CM | POA: Diagnosis not present

## 2016-01-14 NOTE — Addendum Note (Signed)
Addended by: [name] on: 01/14/2016 01:34 PM   Modules accepted: Orders

## 2016-01-14 NOTE — Therapy (Signed)
[place], [place], [address] Phone: [phone]   Fax:  [phone]  Physical Therapy Treatment  Patient Details  Name: Barry Wright MRN: [id number] Date of Birth: 01-Mar-1920 Referring Provider: Lajean Manes, MD  Encounter Date: 01/14/2016      PT End of Session - 01/14/16 1229    Visit Number 20   Number of Visits 26   Date for PT Re-Evaluation 02/08/16   Authorization Type KX modifier   PT Start Time 1145   PT Stop Time 1235   PT Time Calculation (min) 50 min   Equipment Utilized During Treatment Gait belt   Activity Tolerance Patient tolerated treatment well   Behavior During Therapy [facility] for tasks assessed/performed      Past Medical History  Diagnosis Date  . Chronic back pain   . Hypercholesteremia   . Hypothyroidism   . CAD (coronary artery disease)   . Chronic kidney disease (CKD), stage III (moderate)   . Polymyalgia rheumatica ([place])   . Left bundle branch block   . Hx of CABG   . History of nephrectomy   . Internal carotid artery stenosis   . Myocardial infarction ([place])   . CHF (congestive heart failure) ([place])   . Stroke [facility])     Past Surgical History  Procedure Laterality Date  . Coronary artery bypass graft    . Cholecystectomy    . Kidney surgery      kidney removed    There were no vitals filed for this visit.  Visit Diagnosis:  Abnormality of gait  Difficulty walking  Impaired mobility and activities of daily living  Leg weakness, bilateral                       [facility] Adult PT Treatment/Exercise - 01/14/16 1142    Ambulation/Gait   Gait Comments Walked 20 feet in bars then walked  40  feet with RW cued to not stop and continue to push walker and walk with minimal stopping. Cued to turn body fully to sit. His HR was 104 bpm Then rested and walked 50 feet x1 then 40 feet to nustep wher e he did LE only at L5 x 6 min   Lumbar Exercises:  Standing   Other Standing Lumbar Exercises He stood 5 bouts with 4 at 25-30 sec and one at 50 sec,                   PT Short Term Goals - 01/10/16 1228    PT SHORT TERM GOAL #1   Title pt will be independent with intial HEP after review   Status Achieved   PT SHORT TERM GOAL #2   Title He will be able to walk in bars 100 feet or more with turning with only contact guard.   Status On-going   PT SHORT TERM GOAL #3   Title He will be able to roll on mat RT and LT  without asssitance   Baseline Much verbal cuing and a small amount of tactile cuing but no assist with rolling   Status Partially Met   PT SHORT TERM GOAL #4   Title He will be able to go sit to stand with min assist to walker   Status Achieved           PT Long Term Goals - 01/14/16 1232    PT LONG TERM GOAL #1   Title pt will  perform sit to stand without assistance at his WC to walker   Status Achieved   PT LONG TERM GOAL #2   Title pt will ambulate x 30 ft with RW CGA   Status Achieved   PT LONG TERM GOAL #3   Title pt will tolerate standing activities in the clinic x 5 minutes to demonstrate improved endurance   Status Achieved   PT LONG TERM GOAL #4   Title patient able to walk 50 feet with rolling walker and c.g with aide from W/C   Status On-going   PT LONG TERM GOAL #5   Title He will be able to transfer  with RW and pivot fully before sitting with min to no verbal cues.   Status On-going   PT LONG TERM GOAL #6   Title He will be able to transfer with pivot correctly to chair after walking 50 feet with walker. after turning to walk back at 50 feet.    Status On-going               Plan - 2016/01/26 1230    Clinical Impression Statement He wass able to stand for 30 sec ultoiple times and was able to increase speed when requested. He is able to stand from chAIR WITHOT ASSSIST TO PUT HAND SON WALL FOR SUPPORT SHOWING IMPROVED STRENGTH WITH MOBILITY AND ENDURANCE. Marland Kitchen He is improveing and LI am  encouraging at hous to transfer to bed and perform rolling and if they do this we will add some bed exercises for home   PT Home Exercise Plan Cont gait and exercise , Try  standing tolerance, work on turning  to sit to LT and to RT. Address goals   Consulted and Agree with Plan of Care Patient          G-Codes - January 26, 2016 1234    Functional Assessment Tool Used clinical judgement   Functional Limitation Mobility: Walking and moving around   Mobility: Walking and Moving Around Current Status [id number]) At least 60 percent but less than 80 percent impaired, limited or restricted   Mobility: Walking and Moving Around Goal Status [phone]) At least 40 percent but less than 60 percent impaired, limited or restricted      Problem List Patient Active Problem List   Diagnosis Date Noted  . Pressure ulcer 09/29/2015  . Altered mental status   . Acute encephalopathy 09/26/2015  . Chronic back pain 09/26/2015  . Generalized weakness 04/13/2015  . Lobar pneumonia due to unspecified organism 04/13/2015  . Prolonged QT interval   . Weakness 04/11/2015  . CAP (community acquired pneumonia) 01/25/2015  . Dehydration 01/25/2015  . Chronic diastolic heart failure ([place]) 06/19/2014  . Essential hypertension, benign 03/08/2014  . Hypothyroidism 03/08/2014  . Protein-calorie malnutrition, moderate ([place]) 03/08/2014  . Constipation 03/08/2014  . Physical deconditioning 03/08/2014  . Hypokalemia 02/27/2014  . Abnormal serum protein electrophoresis 01/07/2013  . Chronic kidney disease (CKD), stage III (moderate) 01/03/2013  . Polymyalgia rheumatica ([place]) 01/03/2013  . Osteopenia 01/03/2013  . Carotid artery disease ([place]) 01/03/2013  . Spinal stenosis of lumbar region 01/03/2013  . Coronary artery disease 01/03/2013  . Old MI (myocardial infarction) 01/03/2013  . Unspecified transient cerebral ischemia 11/18/2011    [name] PT 01-26-2016, 1:29 PM  [facility] [address] [place], [place], [address] Phone: [phone]   Fax:  [phone]  Name: Barry Wright MRN: [id number] Date of Birth: 1920-04-11

## 2016-01-15 ENCOUNTER — Encounter: Payer: Medicare Other | Admitting: Physical Therapy

## 2016-01-15 ENCOUNTER — Ambulatory Visit: Payer: Medicare Other | Admitting: Physical Therapy

## 2016-01-16 ENCOUNTER — Encounter: Payer: Medicare Other | Admitting: Physical Therapy

## 2016-01-17 DIAGNOSIS — H2513 Age-related nuclear cataract, bilateral: Secondary | ICD-10-CM | POA: Diagnosis not present

## 2016-01-18 ENCOUNTER — Ambulatory Visit: Payer: Medicare Other

## 2016-01-18 DIAGNOSIS — R29898 Other symptoms and signs involving the musculoskeletal system: Secondary | ICD-10-CM | POA: Diagnosis not present

## 2016-01-18 DIAGNOSIS — R269 Unspecified abnormalities of gait and mobility: Secondary | ICD-10-CM | POA: Diagnosis not present

## 2016-01-18 DIAGNOSIS — Z7409 Other reduced mobility: Secondary | ICD-10-CM | POA: Diagnosis not present

## 2016-01-18 DIAGNOSIS — R262 Difficulty in walking, not elsewhere classified: Secondary | ICD-10-CM | POA: Diagnosis not present

## 2016-01-18 NOTE — Therapy (Signed)
[place], [place], [address] Phone: [phone]   Fax:  [phone]  Physical Therapy Treatment  Patient Details  Name: Barry Wright MRN: [id number] Date of Birth: 23-Mar-1920 Referring Provider: Lajean Manes, MD  Encounter Date: 01/18/2016      PT End of Session - 01/18/16 1056    Visit Number 21   Number of Visits 26   Date for PT Re-Evaluation 02/08/16   PT Start Time 1024  fatugue limited ability today   PT Stop Time 1102   PT Time Calculation (min) 38 min   Equipment Utilized During Treatment Gait belt   Activity Tolerance Patient limited by fatigue   Behavior During Therapy [facility] for tasks assessed/performed      Past Medical History  Diagnosis Date  . Chronic back pain   . Hypercholesteremia   . Hypothyroidism   . CAD (coronary artery disease)   . Chronic kidney disease (CKD), stage III (moderate)   . Polymyalgia rheumatica ([place])   . Left bundle branch block   . Hx of CABG   . History of nephrectomy   . Internal carotid artery stenosis   . Myocardial infarction ([place])   . CHF (congestive heart failure) ([place])   . Stroke [facility])     Past Surgical History  Procedure Laterality Date  . Coronary artery bypass graft    . Cholecystectomy    . Kidney surgery      kidney removed    There were no vitals filed for this visit.  Visit Diagnosis:  Abnormality of gait  Difficulty walking  Impaired mobility and activities of daily living  Leg weakness, bilateral      Subjective Assessment - 01/18/16 1044    Subjective No complaints but he reported fatigue with walking and was not able to walk but 5-10 feet Caregiver reports he is walking  at times during day 5 feet or so and stopped using board to transfer  so he stand pivots more now.    Currently in Pain? No/denies                         [facility] Adult PT Treatment/Exercise - 01/18/16 0001    Ambulation/Gait   Gait  Comments in 3  bouts with progressive shorter distance and he reported to tired to continue   Lumbar Exercises: Seated   Sit to Stand Limitations 2 sets of 10 with CGA with HR 100 max then also did standing with alternate arm reach with cues to stand erect  , HR 115 after this then with 3 min rest stood again woith arm reach x 10 reps each cued to stand erect.                   PT Short Term Goals - 01/10/16 1228    PT SHORT TERM GOAL #1   Title pt will be independent with intial HEP after review   Status Achieved   PT SHORT TERM GOAL #2   Title He will be able to walk in bars 100 feet or more with turning with only contact guard.   Status On-going   PT SHORT TERM GOAL #3   Title He will be able to roll on mat RT and LT  without asssitance   Baseline Much verbal cuing and a small amount of tactile cuing but no assist with rolling   Status Partially Met   PT SHORT TERM GOAL #  4   Title He will be able to go sit to stand with min assist to walker   Status Achieved           PT Long Term Goals - 01/14/16 1232    PT LONG TERM GOAL #1   Title pt will perform sit to stand without assistance at his WC to walker   Status Achieved   PT LONG TERM GOAL #2   Title pt will ambulate x 30 ft with RW CGA   Status Achieved   PT LONG TERM GOAL #3   Title pt will tolerate standing activities in the clinic x 5 minutes to demonstrate improved endurance   Status Achieved   PT LONG TERM GOAL #4   Title patient able to walk 50 feet with rolling walker and c.g with aide from W/C   Status On-going   PT LONG TERM GOAL #5   Title He will be able to transfer  with RW and pivot fully before sitting with min to no verbal cues.   Status On-going   PT LONG TERM GOAL #6   Title He will be able to transfer with pivot correctly to chair after walking 50 feet with walker. after turning to walk back at 50 feet.    Status On-going               Plan - 01/18/16 1057    Clinical Impression  Statement Bad day today with walking though he did well with sitio stand and standiung staic with arm raises.  Unsure why he did so poorly with walking today. It may be related to he is doing more at home which is great to hear as he has made a functional gain   PT Next Visit Plan Continue gait with RW and transfers over remaining visits working on turns and any standing exercise to improve strength and tolerance on feet.   Consulted and Agree with Plan of Care Patient  caregiver   Family Member Consulted spouse        Problem List Patient Active Problem List   Diagnosis Date Noted  . Pressure ulcer 09/29/2015  . Altered mental status   . Acute encephalopathy 09/26/2015  . Chronic back pain 09/26/2015  . Generalized weakness 04/13/2015  . Lobar pneumonia due to unspecified organism 04/13/2015  . Prolonged QT interval   . Weakness 04/11/2015  . CAP (community acquired pneumonia) 01/25/2015  . Dehydration 01/25/2015  . Chronic diastolic heart failure ([place]) 06/19/2014  . Essential hypertension, benign 03/08/2014  . Hypothyroidism 03/08/2014  . Protein-calorie malnutrition, moderate ([place]) 03/08/2014  . Constipation 03/08/2014  . Physical deconditioning 03/08/2014  . Hypokalemia 02/27/2014  . Abnormal serum protein electrophoresis 01/07/2013  . Chronic kidney disease (CKD), stage III (moderate) 01/03/2013  . Polymyalgia rheumatica ([place]) 01/03/2013  . Osteopenia 01/03/2013  . Carotid artery disease ([place]) 01/03/2013  . Spinal stenosis of lumbar region 01/03/2013  . Coronary artery disease 01/03/2013  . Old MI (myocardial infarction) 01/03/2013  . Unspecified transient cerebral ischemia 11/18/2011    [name] PT 01/18/2016, 11:21 AM  [facility] [address] [place], [place], [address] Phone: [phone]   Fax:  [phone]  Name: Barry Wright MRN: [id number] Date of Birth: 1919/12/22

## 2016-01-21 ENCOUNTER — Ambulatory Visit: Payer: Medicare Other

## 2016-01-21 DIAGNOSIS — Z7409 Other reduced mobility: Secondary | ICD-10-CM | POA: Diagnosis not present

## 2016-01-21 DIAGNOSIS — R29898 Other symptoms and signs involving the musculoskeletal system: Secondary | ICD-10-CM

## 2016-01-21 DIAGNOSIS — R269 Unspecified abnormalities of gait and mobility: Secondary | ICD-10-CM | POA: Diagnosis not present

## 2016-01-21 DIAGNOSIS — Z789 Other specified health status: Secondary | ICD-10-CM

## 2016-01-21 DIAGNOSIS — R262 Difficulty in walking, not elsewhere classified: Secondary | ICD-10-CM

## 2016-01-21 NOTE — Therapy (Signed)
[place], [place], [address] Phone: [phone]   Fax:  [phone]  Physical Therapy Treatment  Patient Details  Name: Barry Wright MRN: [id number] Date of Birth: [birth year] Referring Provider: Lajean Manes, MD  Encounter Date: 01/21/2016      PT End of Session - 01/21/16 1233    Visit Number 22   Number of Visits 26   Date for PT Re-Evaluation 02/08/16   PT Start Time 8588   PT Stop Time 1227   PT Time Calculation (min) 42 min   Equipment Utilized During Treatment Gait belt   Activity Tolerance Patient tolerated treatment well;Patient limited by fatigue   Behavior During Therapy [facility] for tasks assessed/performed      Past Medical History  Diagnosis Date  . Chronic back pain   . Hypercholesteremia   . Hypothyroidism   . CAD (coronary artery disease)   . Chronic kidney disease (CKD), stage III (moderate)   . Polymyalgia rheumatica ([place])   . Left bundle branch block   . Hx of CABG   . History of nephrectomy   . Internal carotid artery stenosis   . Myocardial infarction ([place])   . CHF (congestive heart failure) ([place])   . Stroke [facility])     Past Surgical History  Procedure Laterality Date  . Coronary artery bypass graft    . Cholecystectomy    . Kidney surgery      kidney removed    There were no vitals filed for this visit.  Visit Diagnosis:  Abnormality of gait  Difficulty walking  Impaired mobility and activities of daily living  Leg weakness, bilateral                       [facility] Adult PT Treatment/Exercise - 01/21/16 0001    Ambulation/Gait   Gait Comments after 20 feet in bars he walked 65 feet with RW and verbal cues /CG with RW.Marland KitchenHis posture was good for the first 25 feet and speed. He does not weight shift to RT  as much whn tired.    Lumbar Exercises: Standing   Other Standing Lumbar Exercises Stood at walker 3 bouts of 60 sec or more with lifting one  or both arms and cued to keep posture erect then  stood at pully with green band around both  knee sand worked on staic knee extension  with 10 sec holds 10-12 reps. , then stood x 5 from [facility] without walker with CGA 10-15 sec     During rest period  HR was assessed( Max 108 bpm) and we discussed home activity with standing tolerance. It appears the caregiver is doing as much as she can safely do at home.              PT Short Term Goals - 01/10/16 1228    PT SHORT TERM GOAL #1   Title pt will be independent with intial HEP after review   Status Achieved   PT SHORT TERM GOAL #2   Title He will be able to walk in bars 100 feet or more with turning with only contact guard.   Status On-going   PT SHORT TERM GOAL #3   Title He will be able to roll on mat RT and LT  without asssitance   Baseline Much verbal cuing and a small amount of tactile cuing but no assist with rolling   Status Partially Met   PT SHORT TERM  GOAL #4   Title He will be able to go sit to stand with min assist to walker   Status Achieved           PT Long Term Goals - 01/14/16 1232    PT LONG TERM GOAL #1   Title pt will perform sit to stand without assistance at his WC to walker   Status Achieved   PT LONG TERM GOAL #2   Title pt will ambulate x 30 ft with RW CGA   Status Achieved   PT LONG TERM GOAL #3   Title pt will tolerate standing activities in the clinic x 5 minutes to demonstrate improved endurance   Status Achieved   PT LONG TERM GOAL #4   Title patient able to walk 50 feet with rolling walker and c.g with aide from W/C   Status On-going   PT LONG TERM GOAL #5   Title He will be able to transfer  with RW and pivot fully before sitting with min to no verbal cues.   Status On-going   PT LONG TERM GOAL #6   Title He will be able to transfer with pivot correctly to chair after walking 50 feet with walker. after turning to walk back at 50 feet.    Status On-going               Plan -  01/21/16 1233    Clinical Impression Statement Today e showed he has no lost edurance and he was actually able to stand for 15 sec withoutn UE support though I did some light stabilization . He has made some measureable progress and doing more at home He still has issues with pivoting /turning to chair making him to reqiure close guard and sometinmes mod to max assist to safely finish  pivot.   PT Next Visit Plan Continue gait with RW and transfers over remaining visits working on turns and any standing exercise to improve strength and tolerance on feet.   Consulted and Agree with Plan of Care Patient   Family Member Consulted spouse        Problem List Patient Active Problem List   Diagnosis Date Noted  . Pressure ulcer 09/29/2015  . Altered mental status   . Acute encephalopathy 09/26/2015  . Chronic back pain 09/26/2015  . Generalized weakness 04/13/2015  . Lobar pneumonia due to unspecified organism 04/13/2015  . Prolonged QT interval   . Weakness 04/11/2015  . CAP (community acquired pneumonia) 01/25/2015  . Dehydration 01/25/2015  . Chronic diastolic heart failure ([place]) 06/19/2014  . Essential hypertension, benign 03/08/2014  . Hypothyroidism 03/08/2014  . Protein-calorie malnutrition, moderate ([place]) 03/08/2014  . Constipation 03/08/2014  . Physical deconditioning 03/08/2014  . Hypokalemia 02/27/2014  . Abnormal serum protein electrophoresis 01/07/2013  . Chronic kidney disease (CKD), stage III (moderate) 01/03/2013  . Polymyalgia rheumatica ([place]) 01/03/2013  . Osteopenia 01/03/2013  . Carotid artery disease ([place]) 01/03/2013  . Spinal stenosis of lumbar region 01/03/2013  . Coronary artery disease 01/03/2013  . Old MI (myocardial infarction) 01/03/2013  . Unspecified transient cerebral ischemia 11/18/2011    [name] PT 01/21/2016, 12:38 PM  [facility] [facility] [address] [place], [place], [address] Phone:  [phone]   Fax:  [phone]  Name: ANTHANY THORNHILL MRN: [id number] Date of Birth: 1920/07/24

## 2016-01-28 ENCOUNTER — Ambulatory Visit: Payer: Medicare Other | Admitting: Physical Therapy

## 2016-02-06 DIAGNOSIS — L821 Other seborrheic keratosis: Secondary | ICD-10-CM | POA: Diagnosis not present

## 2016-02-06 DIAGNOSIS — L218 Other seborrheic dermatitis: Secondary | ICD-10-CM | POA: Diagnosis not present

## 2016-02-11 ENCOUNTER — Ambulatory Visit: Payer: Medicare Other | Attending: Geriatric Medicine

## 2016-02-11 DIAGNOSIS — M6289 Other specified disorders of muscle: Secondary | ICD-10-CM | POA: Insufficient documentation

## 2016-02-11 DIAGNOSIS — R262 Difficulty in walking, not elsewhere classified: Secondary | ICD-10-CM | POA: Diagnosis not present

## 2016-02-11 DIAGNOSIS — M6281 Muscle weakness (generalized): Secondary | ICD-10-CM | POA: Diagnosis not present

## 2016-02-11 NOTE — Therapy (Signed)
[place], [place], [address] Phone: [phone]   Fax:  [phone]  Physical Therapy Treatment  Patient Details  Name: Barry Wright MRN: [id number] Date of Birth: February 13, 1920 Referring Provider: Lajean Manes, MD  Encounter Date: 02/11/2016      PT End of Session - 02/11/16 1232    Visit Number 23   Number of Visits 31   Date for PT Re-Evaluation 03/07/16   PT Start Time 6415   PT Stop Time 1100   PT Time Calculation (min) 45 min   Equipment Utilized During Treatment Gait belt   Activity Tolerance Patient limited by fatigue   Behavior During Therapy [facility] for tasks assessed/performed      Past Medical History  Diagnosis Date  . Chronic back pain   . Hypercholesteremia   . Hypothyroidism   . CAD (coronary artery disease)   . Chronic kidney disease (CKD), stage III (moderate)   . Polymyalgia rheumatica ([place])   . Left bundle branch block   . Hx of CABG   . History of nephrectomy   . Internal carotid artery stenosis   . Myocardial infarction ([place])   . CHF (congestive heart failure) ([place])   . Stroke [facility])     Past Surgical History  Procedure Laterality Date  . Coronary artery bypass graft    . Cholecystectomy    . Kidney surgery      kidney removed    There were no vitals filed for this visit.  Visit Diagnosis:  Muscle weakness (generalized)  Muscle fatigue      Subjective Assessment - 02/11/16 1050    Subjective Tired and stiff today. Didnt eat breakfast. We were told he could only come 1x/week and we were not scheduled past 2 weeks.    Currently in Pain? No/denies                                 PT Education - 02/11/16 1231    Education provided Yes   Education Details Discussed regression today and need for sustained pain improvement to continue PT    Person(s) Educated Spouse;Patient   Methods Explanation   Comprehension Verbalized understanding          PT Short Term Goals - 01/10/16 1228    PT SHORT TERM GOAL #1   Title pt will be independent with intial HEP after review   Status Achieved   PT SHORT TERM GOAL #2   Title He will be able to walk in bars 100 feet or more with turning with only contact guard.   Status On-going   PT SHORT TERM GOAL #3   Title He will be able to roll on mat RT and LT  without asssitance   Baseline Much verbal cuing and a small amount of tactile cuing but no assist with rolling   Status Partially Met   PT SHORT TERM GOAL #4   Title He will be able to go sit to stand with min assist to walker   Status Achieved           PT Long Term Goals - 02/11/16 1329    PT LONG TERM GOAL #1   Title pt will perform sit to stand without assistance at his WC to walker   Status Achieved   PT LONG TERM GOAL #2   Title pt will ambulate x 30 ft with RW CGA  Baseline unable today   Status On-going   PT LONG TERM GOAL #3   Title pt will tolerate standing activities in the clinic x 5 minutes to demonstrate improved endurance   Baseline 90 seconds today   Status On-going   PT LONG TERM GOAL #4   Title patient able to walk 50 feet with rolling walker and c.g with aide from W/C   Baseline 10 feet per aide last few days   Status On-going   PT LONG TERM GOAL #5   Title He will be able to transfer  with RW and pivot fully before sitting with min to no verbal cues.   Status On-going   PT LONG TERM GOAL #6   Title He will be able to transfer with pivot correctly to chair after walking 50 feet with walker. after turning to walk back at 50 feet.    Status On-going               Plan - 02/11/16 1233    Clinical Impression Statement Mr Barry Wright was significantly worse today with tolerance  and  he reports decreased activity at home due to caregivers are  varied in ability to assist with activity. We discussed we will see for 3-4 weeks and decide then if continue as the plan was to see him another 2-3 weeks as  he was improved as of last vist   Pt will benefit from skilled therapeutic intervention in order to improve on the following deficits Abnormal gait;Difficulty walking;Decreased strength;Decreased range of motion;Decreased activity tolerance   Rehab Potential Fair   Clinical Impairments Affecting Rehab Potential Pt with lumbar stenosis and deconditioning.    PT Frequency 2x / week   PT Duration 4 weeks   PT Treatment/Interventions Gait training;Functional mobility training;Therapeutic exercise;Patient/family education;Passive range of motion   PT Next Visit Plan Continue gait with RW and transfers over remaining visits working on turns and any standing exercise to improve strength and tolerance on feet.   Consulted and Agree with Plan of Care Patient   Family Member Consulted spouse        Problem List Patient Active Problem List   Diagnosis Date Noted  . Pressure ulcer 09/29/2015  . Altered mental status   . Acute encephalopathy 09/26/2015  . Chronic back pain 09/26/2015  . Generalized weakness 04/13/2015  . Lobar pneumonia due to unspecified organism 04/13/2015  . Prolonged QT interval   . Weakness 04/11/2015  . CAP (community acquired pneumonia) 01/25/2015  . Dehydration 01/25/2015  . Chronic diastolic heart failure ([place]) 06/19/2014  . Essential hypertension, benign 03/08/2014  . Hypothyroidism 03/08/2014  . Protein-calorie malnutrition, moderate ([place]) 03/08/2014  . Constipation 03/08/2014  . Physical deconditioning 03/08/2014  . Hypokalemia 02/27/2014  . Abnormal serum protein electrophoresis 01/07/2013  . Chronic kidney disease (CKD), stage III (moderate) 01/03/2013  . Polymyalgia rheumatica ([place]) 01/03/2013  . Osteopenia 01/03/2013  . Carotid artery disease ([place]) 01/03/2013  . Spinal stenosis of lumbar region 01/03/2013  . Coronary artery disease 01/03/2013  . Old MI (myocardial infarction) 01/03/2013  . Unspecified transient cerebral ischemia 11/18/2011    [name] PT 02/11/2016, 1:34 PM  [facility] [address] [place], [place], [address] Phone: [phone]   Fax:  [phone]  Name: Barry Wright MRN: [id number] Date of Birth: February 19, 1920

## 2016-02-21 ENCOUNTER — Encounter (HOSPITAL_COMMUNITY): Payer: Self-pay | Admitting: *Deleted

## 2016-02-21 ENCOUNTER — Ambulatory Visit: Payer: Medicare Other

## 2016-02-21 ENCOUNTER — Inpatient Hospital Stay (HOSPITAL_COMMUNITY)
Admission: EM | Admit: 2016-02-21 | Discharge: 2016-02-24 | DRG: 689 | Disposition: A | Discharge status: Home Health | Payer: Medicare Other | Attending: Internal Medicine | Admitting: Internal Medicine

## 2016-02-21 ENCOUNTER — Emergency Department (HOSPITAL_COMMUNITY): Payer: Medicare Other

## 2016-02-21 DIAGNOSIS — R319 Hematuria, unspecified: Secondary | ICD-10-CM | POA: Diagnosis present

## 2016-02-21 DIAGNOSIS — Z905 Acquired absence of kidney: Secondary | ICD-10-CM

## 2016-02-21 DIAGNOSIS — N183 Chronic kidney disease, stage 3 unspecified: Secondary | ICD-10-CM

## 2016-02-21 DIAGNOSIS — R4182 Altered mental status, unspecified: Secondary | ICD-10-CM | POA: Diagnosis not present

## 2016-02-21 DIAGNOSIS — I5032 Chronic diastolic (congestive) heart failure: Secondary | ICD-10-CM | POA: Diagnosis not present

## 2016-02-21 DIAGNOSIS — Z79899 Other long term (current) drug therapy: Secondary | ICD-10-CM

## 2016-02-21 DIAGNOSIS — Z880 Allergy status to penicillin: Secondary | ICD-10-CM | POA: Diagnosis not present

## 2016-02-21 DIAGNOSIS — Z951 Presence of aortocoronary bypass graft: Secondary | ICD-10-CM | POA: Diagnosis not present

## 2016-02-21 DIAGNOSIS — Z87891 Personal history of nicotine dependence: Secondary | ICD-10-CM

## 2016-02-21 DIAGNOSIS — R402411 Glasgow coma scale score 13-15, in the field [EMT or ambulance]: Secondary | ICD-10-CM | POA: Diagnosis not present

## 2016-02-21 DIAGNOSIS — D72829 Elevated white blood cell count, unspecified: Secondary | ICD-10-CM

## 2016-02-21 DIAGNOSIS — Z8249 Family history of ischemic heart disease and other diseases of the circulatory system: Secondary | ICD-10-CM

## 2016-02-21 DIAGNOSIS — R404 Transient alteration of awareness: Secondary | ICD-10-CM | POA: Diagnosis not present

## 2016-02-21 DIAGNOSIS — F4489 Other dissociative and conversion disorders: Secondary | ICD-10-CM | POA: Diagnosis not present

## 2016-02-21 DIAGNOSIS — I252 Old myocardial infarction: Secondary | ICD-10-CM

## 2016-02-21 DIAGNOSIS — N390 Urinary tract infection, site not specified: Secondary | ICD-10-CM | POA: Diagnosis present

## 2016-02-21 DIAGNOSIS — R531 Weakness: Secondary | ICD-10-CM

## 2016-02-21 DIAGNOSIS — G9340 Encephalopathy, unspecified: Secondary | ICD-10-CM | POA: Diagnosis present

## 2016-02-21 DIAGNOSIS — B952 Enterococcus as the cause of diseases classified elsewhere: Secondary | ICD-10-CM | POA: Diagnosis present

## 2016-02-21 DIAGNOSIS — E7800 Pure hypercholesterolemia, unspecified: Secondary | ICD-10-CM | POA: Diagnosis present

## 2016-02-21 DIAGNOSIS — I2510 Atherosclerotic heart disease of native coronary artery without angina pectoris: Secondary | ICD-10-CM | POA: Diagnosis present

## 2016-02-21 DIAGNOSIS — E860 Dehydration: Secondary | ICD-10-CM | POA: Diagnosis present

## 2016-02-21 DIAGNOSIS — N179 Acute kidney failure, unspecified: Secondary | ICD-10-CM | POA: Diagnosis not present

## 2016-02-21 DIAGNOSIS — E039 Hypothyroidism, unspecified: Secondary | ICD-10-CM | POA: Diagnosis present

## 2016-02-21 DIAGNOSIS — Z7982 Long term (current) use of aspirin: Secondary | ICD-10-CM | POA: Diagnosis not present

## 2016-02-21 DIAGNOSIS — Z8673 Personal history of transient ischemic attack (TIA), and cerebral infarction without residual deficits: Secondary | ICD-10-CM

## 2016-02-21 DIAGNOSIS — N289 Disorder of kidney and ureter, unspecified: Secondary | ICD-10-CM | POA: Diagnosis not present

## 2016-02-21 DIAGNOSIS — M353 Polymyalgia rheumatica: Secondary | ICD-10-CM | POA: Diagnosis not present

## 2016-02-21 DIAGNOSIS — G459 Transient cerebral ischemic attack, unspecified: Secondary | ICD-10-CM | POA: Diagnosis present

## 2016-02-21 LAB — CBC WITH DIFFERENTIAL/PLATELET
Basophils Absolute: 0 10*3/uL (ref 0.0–0.1)
Basophils Relative: 0 %
EOS ABS: 0.1 10*3/uL (ref 0.0–0.7)
EOS PCT: 1 %
HCT: 40.4 % (ref 39.0–52.0)
Hemoglobin: 13.7 g/dL (ref 13.0–17.0)
LYMPHS ABS: 2.3 10*3/uL (ref 0.7–4.0)
Lymphocytes Relative: 19 %
MCH: 33 pg (ref 26.0–34.0)
MCHC: 33.9 g/dL (ref 30.0–36.0)
MCV: 97.3 fL (ref 78.0–100.0)
MONOS PCT: 9 %
Monocytes Absolute: 1.1 10*3/uL — ABNORMAL HIGH (ref 0.1–1.0)
Neutro Abs: 8.6 10*3/uL — ABNORMAL HIGH (ref 1.7–7.7)
Neutrophils Relative %: 71 %
PLATELETS: 195 10*3/uL (ref 150–400)
RBC: 4.15 MIL/uL — ABNORMAL LOW (ref 4.22–5.81)
RDW: 14.2 % (ref 11.5–15.5)
WBC: 12 10*3/uL — AB (ref 4.0–10.5)

## 2016-02-21 LAB — URINE MICROSCOPIC-ADD ON

## 2016-02-21 LAB — COMPREHENSIVE METABOLIC PANEL
ALT: 12 U/L — AB (ref 17–63)
AST: 25 U/L (ref 15–41)
Albumin: 2.8 g/dL — ABNORMAL LOW (ref 3.5–5.0)
Alkaline Phosphatase: 69 U/L (ref 38–126)
Anion gap: 10 (ref 5–15)
BUN: 27 mg/dL — AB (ref 6–20)
CHLORIDE: 105 mmol/L (ref 101–111)
CO2: 27 mmol/L (ref 22–32)
CREATININE: 1.79 mg/dL — AB (ref 0.61–1.24)
Calcium: 8.7 mg/dL — ABNORMAL LOW (ref 8.9–10.3)
GFR calc Af Amer: 36 mL/min — ABNORMAL LOW (ref >60)
GFR, EST NON AFRICAN AMERICAN: 31 mL/min — AB (ref >60)
Glucose, Bld: 116 mg/dL — ABNORMAL HIGH (ref 65–99)
Potassium: 4.4 mmol/L (ref 3.5–5.1)
Sodium: 142 mmol/L (ref 135–145)
Total Bilirubin: 1 mg/dL (ref 0.3–1.2)
Total Protein: 5.8 g/dL — ABNORMAL LOW (ref 6.5–8.1)

## 2016-02-21 LAB — BLOOD GAS, ARTERIAL
ACID-BASE EXCESS: 1.7 mmol/L (ref 0.0–2.0)
BICARBONATE: 24.5 meq/L — AB (ref 20.0–24.0)
Drawn by: 257701
FIO2: 0.21
O2 SAT: 91 %
PATIENT TEMPERATURE: 98.6
TCO2: 21.5 mmol/L (ref 0–100)
pCO2 arterial: 34 mmHg — ABNORMAL LOW (ref 35.0–45.0)
pH, Arterial: 7.471 — ABNORMAL HIGH (ref 7.350–7.450)
pO2, Arterial: 63.8 mmHg — ABNORMAL LOW (ref 80.0–100.0)

## 2016-02-21 LAB — URINALYSIS, ROUTINE W REFLEX MICROSCOPIC
BILIRUBIN URINE: NEGATIVE
Glucose, UA: NEGATIVE mg/dL
Ketones, ur: NEGATIVE mg/dL
Nitrite: NEGATIVE
PROTEIN: 100 mg/dL — AB
Specific Gravity, Urine: 1.016 (ref 1.005–1.030)
pH: 6 (ref 5.0–8.0)

## 2016-02-21 LAB — CBG MONITORING, ED: Glucose-Capillary: 118 mg/dL — ABNORMAL HIGH (ref 65–99)

## 2016-02-21 LAB — I-STAT TROPONIN, ED: TROPONIN I, POC: 0.02 ng/mL (ref 0.00–0.08)

## 2016-02-21 LAB — I-STAT CG4 LACTIC ACID, ED: LACTIC ACID, VENOUS: 1.21 mmol/L (ref 0.5–2.0)

## 2016-02-21 MED ORDER — ASPIRIN 81 MG PO CHEW
81.0000 mg | CHEWABLE_TABLET | Freq: Every day | ORAL | Status: DC
  Administered 2016-02-21 – 2016-02-24 (×4): 81 mg via ORAL
  Filled 2016-02-21 (×4): qty 1

## 2016-02-21 MED ORDER — CALCIUM CARBONATE 1250 (500 CA) MG PO TABS
1.0000 | ORAL_TABLET | Freq: Every day | ORAL | Status: DC
  Administered 2016-02-22 – 2016-02-24 (×3): 500 mg via ORAL
  Filled 2016-02-21 (×3): qty 1

## 2016-02-21 MED ORDER — ACETAMINOPHEN 325 MG PO TABS: 650.0000 mg | ORAL_TABLET | Freq: Four times a day (QID) | ORAL | Status: DC | PRN

## 2016-02-21 MED ORDER — POLYETHYLENE GLYCOL 3350 17 G PO PACK: 17.0000 g | PACK | Freq: Every day | ORAL | Status: DC | PRN

## 2016-02-21 MED ORDER — ACETAMINOPHEN 500 MG PO TABS
1000.0000 mg | ORAL_TABLET | Freq: Two times a day (BID) | ORAL | Status: DC
  Administered 2016-02-21 – 2016-02-23 (×4): 1000 mg via ORAL
  Filled 2016-02-21 (×4): qty 2

## 2016-02-21 MED ORDER — CEFTRIAXONE SODIUM 1 G IJ SOLR
1.0000 g | Freq: Once | INTRAMUSCULAR | Status: AC
  Administered 2016-02-21: 1 g via INTRAVENOUS
  Filled 2016-02-21: qty 10

## 2016-02-21 MED ORDER — SODIUM CHLORIDE 0.9 % IV SOLN
INTRAVENOUS | Status: DC
  Administered 2016-02-21 – 2016-02-24 (×3): via INTRAVENOUS

## 2016-02-21 MED ORDER — DEXTROSE 5 % IV SOLN
1.0000 g | INTRAVENOUS | Status: DC
  Administered 2016-02-22: 1 g via INTRAVENOUS
  Filled 2016-02-21 (×2): qty 10

## 2016-02-21 MED ORDER — HEPARIN SODIUM (PORCINE) 5000 UNIT/ML IJ SOLN
5000.0000 [IU] | Freq: Three times a day (TID) | INTRAMUSCULAR | Status: DC
Start: 2016-02-21 — End: 2016-02-24
  Administered 2016-02-21 – 2016-02-24 (×10): 5000 [IU] via SUBCUTANEOUS
  Filled 2016-02-21 (×10): qty 1

## 2016-02-21 MED ORDER — CLOPIDOGREL BISULFATE 75 MG PO TABS
75.0000 mg | ORAL_TABLET | Freq: Every day | ORAL | Status: DC
  Administered 2016-02-21 – 2016-02-24 (×4): 75 mg via ORAL
  Filled 2016-02-21 (×4): qty 1

## 2016-02-21 MED ORDER — ACETAMINOPHEN 650 MG RE SUPP: 650.0000 mg | Freq: Four times a day (QID) | RECTAL | Status: DC | PRN

## 2016-02-21 MED ORDER — FOLIC ACID 1 MG PO TABS
1.0000 mg | ORAL_TABLET | Freq: Every day | ORAL | Status: DC
  Administered 2016-02-21 – 2016-02-24 (×4): 1 mg via ORAL
  Filled 2016-02-21 (×4): qty 1

## 2016-02-21 MED ORDER — LEVOTHYROXINE SODIUM 100 MCG PO TABS
100.0000 ug | ORAL_TABLET | Freq: Every day | ORAL | Status: DC
  Administered 2016-02-22 – 2016-02-24 (×3): 100 ug via ORAL
  Filled 2016-02-21 (×3): qty 1

## 2016-02-21 MED ORDER — NITROGLYCERIN 0.4 MG SL SUBL: 0.4000 mg | SUBLINGUAL_TABLET | SUBLINGUAL | Status: DC | PRN

## 2016-02-21 MED ORDER — ADULT MULTIVITAMIN W/MINERALS CH
1.0000 | ORAL_TABLET | Freq: Every day | ORAL | Status: DC
  Administered 2016-02-21 – 2016-02-24 (×4): 1 via ORAL
  Filled 2016-02-21 (×4): qty 1

## 2016-02-21 NOTE — ED Notes (Addendum)
Per EMS Pt from home with c/o AMS since 6 this am, EMS was called first time at 6:00 when pt declined transport, however an hour ago wife and home health nurse called EMS again, due to pt not being responsive. Upon arrival EMS reports pt was difficult to arouse at first, but is now awake, alert and oriented as per baseline. EMS reports strong urine odor.

## 2016-02-21 NOTE — H&P (Signed)
Patient Demographics  Barry Wright, is a 82 y.o. male  MRN: [id number]   DOB - 1920-09-18  Admit Date - 02/21/2016  Outpatient Primary MD for the patient is Mathews Argyle, MD   With History of -  Past Medical History  Diagnosis Date  . Chronic back pain   . Hypercholesteremia   . Hypothyroidism   . CAD (coronary artery disease)   . Chronic kidney disease (CKD), stage III (moderate)   . Polymyalgia rheumatica ([place])   . Left bundle branch block   . Hx of CABG   . History of nephrectomy   . Internal carotid artery stenosis   . Myocardial infarction ([place])   . CHF (congestive heart failure) ([place])   . Stroke [facility])       Past Surgical History  Procedure Laterality Date  . Coronary artery bypass graft    . Cholecystectomy    . Kidney surgery      kidney removed    in for   Chief Complaint  Patient presents with  . Altered Mental Status     HPI  Barry Wright  is a 82 y.o. male, 82 y.o. male with a history of CAD, CHF, PMR, Stage III CKD who was brought to the ED due to increased Somnolence and lethargy, patient lives with wife at home, wheelchair dependent, seen by caregiver who noticed him to be more lethargic than baseline, as well reports poor appetite, currently no family member at bedside, history was obtained from ED physician, she denies any focal deficits, tingling or numbness, currently patient is more awake than presentation to ED, patient with low-grade temperature 99.4, but he denies any cough or productive sputum, urinalysis was positive, urine culture was sent, patient started on IV Rocephin, hospitalist was called to admit.    Review of Systems    In addition to the HPI above, No Fever-chills, No Headache, No changes with Vision or hearing, No problems swallowing food or Liquids, No Chest pain, Cough or Shortness of Breath, No Abdominal pain, No Nausea or Vommitting, Bowel movements are regular, No Blood in stool or Urine, No dysuria, No  new skin rashes or bruises, No new joints pains-aches,  It was weakness and fatigue, No recent weight gain or loss, No polyuria, polydypsia or polyphagia, No significant Mental Stressors.  A full 10 point Review of Systems was done, except as stated above, all other Review of Systems were negative.   Social History Social History  Substance Use Topics  . Smoking status: Former Smoker    Types: Cigarettes    Quit date: 11/10/1970  . Smokeless tobacco: Never Used  . Alcohol Use: No     Family History Family History  Problem Relation Age of Onset  . Other Mother     [place] Flu  . Heart disease Father      Prior to Admission medications   Medication Sig Start Date End Date Taking? Authorizing Provider  acetaminophen (TYLENOL) 500 MG tablet Take 1,000 mg by mouth 2 (two) times daily.    Yes Historical Provider, MD  aspirin 81 MG tablet Take 81 mg by mouth daily.   Yes Historical Provider, MD  calcium carbonate (OS-CAL - DOSED IN MG OF ELEMENTAL CALCIUM) 1250 MG tablet Take 1 tablet by mouth daily with breakfast.   Yes Historical Provider, MD  clopidogrel (PLAVIX) 75 MG tablet Take 75 mg by mouth daily.     Yes Historical Provider, MD  fexofenadine (ALLEGRA) 180 MG  tablet Take 180 mg by mouth daily.   Yes Historical Provider, MD  folic acid (FOLVITE) 1 MG tablet Take 1 mg by mouth daily.   Yes Historical Provider, MD  furosemide (LASIX) 20 MG tablet Take 0.5 tablets (10 mg total) by mouth daily. Patient taking differently: Take 20 mg by mouth daily.  01/30/15  Yes Theodis Blaze, MD  methotrexate (RHEUMATREX) 2.5 MG tablet Take 10 mg by mouth every Wednesday.   Yes Historical Provider, MD  Multiple Vitamins-Minerals (MULTIVITAMINS THER. W/MINERALS) TABS Take 1 tablet by mouth daily.     Yes Historical Provider, MD  nitroGLYCERIN (NITROSTAT) 0.4 MG SL tablet Place 0.4 mg under the tongue every 5 (five) minutes as needed for chest pain.   Yes Historical Provider, MD  polyethylene  glycol (MIRALAX / GLYCOLAX) packet Take 17 g by mouth daily as needed for mild constipation.    Yes Historical Provider, MD  SYNTHROID 100 MCG tablet Take 100 mcg by mouth daily before breakfast.  09/19/15  Yes Historical Provider, MD  traMADol (ULTRAM) 50 MG tablet Take one tablet by mouth twice daily for pains PRN Patient taking differently: Take 50 mg by mouth 2 (two) times daily.  09/29/15  Yes Elmarie Shiley, MD    Allergies  Allergen Reactions  . Penicillins Rash    Has patient had a PCN reaction causing immediate rash, facial/tongue/throat swelling, SOB or lightheadedness with hypotension: Unknown Has patient had a PCN reaction causing severe rash involving mucus membranes or skin necrosis: Unknown Has patient had a PCN reaction that required hospitalization: Unknown Has patient had a PCN reaction occurring within the last 10 years: Unknown If all of the above answers are "NO", then may proceed with Cephalosporin use.     Physical Exam  Vitals  Blood pressure 114/66, pulse 80, temperature 99.4 F (37.4 C), temperature source Rectal, resp. rate 20, SpO2 97 %.   1. General frail elderly male lying in bed in NAD,    2. Normal affect and insight, Not Suicidal or Homicidal, Awake Alert, communicative, oriented to name, and place (knows he is in hospital, but can't recall name)  3. No F.N deficits, ALL C.Nerves Intact, patient with generalized weakness, but no gross deficits.  4. Ears and Eyes appear Normal, Conjunctivae clear, PERRLA. Moist Oral Mucosa.  5. Supple Neck, No JVD, No cervical lymphadenopathy appriciated, No Carotid Bruits.  6. Symmetrical Chest wall movement, Good air movement bilaterally, CTAB.  7. RRR, No Gallops, Rubs or Murmurs, No Parasternal Heave.  8. Positive Bowel Sounds, Abdomen Soft, No tenderness, No organomegaly appriciated,No rebound -guarding or rigidity.  9.  No Cyanosis, Normal Skin Turgor, No Skin Rash or Bruise.  10. Good muscle tone,   joints appear normal , no effusions, Normal ROM.  11. No Palpable Lymph Nodes in Neck or Axillae   Data Review  CBC  Recent Labs Lab 02/21/16 1311  WBC 12.0*  HGB 13.7  HCT 40.4  PLT 195  MCV 97.3  MCH 33.0  MCHC 33.9  RDW 14.2  LYMPHSABS 2.3  MONOABS 1.1*  EOSABS 0.1  BASOSABS 0.0   ------------------------------------------------------------------------------------------------------------------  Chemistries   Recent Labs Lab 02/21/16 1311  NA 142  K 4.4  CL 105  CO2 27  GLUCOSE 116*  BUN 27*  CREATININE 1.79*  CALCIUM 8.7*  AST 25  ALT 12*  ALKPHOS 69  BILITOT 1.0   ------------------------------------------------------------------------------------------------------------------ CrCl cannot be calculated (Unknown ideal weight.). ------------------------------------------------------------------------------------------------------------------ No results for input(s): TSH, T4TOTAL, T3FREE, THYROIDAB  in the last 72 hours.  Invalid input(s): FREET3   Coagulation profile No results for input(s): INR, PROTIME in the last 168 hours. ------------------------------------------------------------------------------------------------------------------- No results for input(s): DDIMER in the last 72 hours. -------------------------------------------------------------------------------------------------------------------  Cardiac Enzymes No results for input(s): CKMB, TROPONINI, MYOGLOBIN in the last 168 hours.  Invalid input(s): CK ------------------------------------------------------------------------------------------------------------------ Invalid input(s): POCBNP   ---------------------------------------------------------------------------------------------------------------  Urinalysis    Component Value Date/Time   COLORURINE YELLOW 02/21/2016 1156   APPEARANCEUR TURBID* 02/21/2016 1156   LABSPEC 1.016 02/21/2016 1156   PHURINE 6.0 02/21/2016  1156   GLUCOSEU NEGATIVE 02/21/2016 1156   HGBUR MODERATE* 02/21/2016 1156   BILIRUBINUR NEGATIVE 02/21/2016 1156   KETONESUR NEGATIVE 02/21/2016 1156   PROTEINUR 100* 02/21/2016 1156   UROBILINOGEN 0.2 04/11/2015 1437   NITRITE NEGATIVE 02/21/2016 1156   LEUKOCYTESUR LARGE* 02/21/2016 1156    ----------------------------------------------------------------------------------------------------------------  Imaging results:   Ct Head Wo Contrast  02/21/2016  CLINICAL DATA:  Altered mental status EXAM: CT HEAD WITHOUT CONTRAST TECHNIQUE: Contiguous axial images were obtained from the base of the skull through the vertex without intravenous contrast. COMPARISON:  11/13/2015 FINDINGS: Bony calvarium is intact. No gross soft tissue abnormality is seen. Diffuse atrophic changes and chronic white matter ischemic changes are seen. These are stable from the previous exam. No findings to suggest acute hemorrhage, acute infarction or space-occupying mass lesion are noted. IMPRESSION: Chronic atrophic and ischemic changes without acute abnormality. Electronically Signed   By: Inez Catalina M.D.   On: 02/21/2016 12:23      Assessment & Plan  Principal Problem:   Acute encephalopathy Active Problems:   UTI (lower urinary tract infection)   AKI (acute kidney injury) ([place])   Transient cerebral ischemia   Polymyalgia rheumatica (H[facility])   Chronic diastolic heart failure (H[facility])   Weakness  Acute encephalopathy - Metabolic, most likely related to dehydration and infectious process, CT head with no acute finding, continue with UTI treatment and IV fluids.  UTI - Continue with IV Rocephin, follow on urine cultures  Acute kidney injury on CK D stage III -Likely prerenal secondary to volume depletion, continue with IV fluids  Weakness - We'll consult PT  History of TIA, CAD and carotid artery disease - Denies any chest pain or shortness of breath, continue with aspirin and Plavix  Polymyalgia  rheumatica - We'll hold methotrexate secondary to acute illness, when resume on more stable  Chronic diastolic heart failure - Appears to be dehydrated, continue to hold Lasix, monitor closely as an IV hydration    DVT Prophylaxis Heparin   AM Labs Ordered, also please review Full Orders  Family Communication: Admission, patients condition and plan of care including tests being ordered have been discussed with the patient and wife via phone who indicate understanding and agree with the plan and Code Status.  Code Status presumed full code  Likely DC to ; pending PT evaluation  Condition GUARDED    Time spent in minutes :55 miutes    ELGERGAWY, DAWOOD M.D on 02/21/2016 at 3:28 PM  Between 7am to 7pm - Pager - [phone]  After 7pm go to [email or web address] - password [email or web address]  And look for the night coverage person covering me after hours  [name] Office  [phone]

## 2016-02-21 NOTE — Addendum Note (Signed)
Addended by: [name] on: 02/21/2016 04:07 PM   Modules accepted: Orders

## 2016-02-21 NOTE — ED Provider Notes (Signed)
CSN: [id number]     Arrival date & time 02/21/16  1054 History   First MD Initiated Contact with Patient 02/21/16 1115     Chief Complaint  Patient presents with  . Altered Mental Status     (Consider location/radiation/quality/duration/timing/severity/associated sxs/prior Treatment) Patient is a 82 y.o. male presenting with altered mental status. The history is provided by the EMS personnel.  Altered Mental Status Presenting symptoms: behavior changes, lethargy and partial responsiveness   Severity:  Severe Most recent episode:  Today Episode history:  Continuous Timing:  Constant Progression:  Unchanged Chronicity:  New Context: not alcohol use and not a nursing home resident   Associated symptoms: fever and slurred speech   Associated symptoms: no vomiting     Past Medical History  Diagnosis Date  . Chronic back pain   . Hypercholesteremia   . Hypothyroidism   . CAD (coronary artery disease)   . Chronic kidney disease (CKD), stage III (moderate)   . Polymyalgia rheumatica ([place])   . Left bundle branch block   . Hx of CABG   . History of nephrectomy   . Internal carotid artery stenosis   . Myocardial infarction ([place])   . CHF (congestive heart failure) ([place])   . Stroke [facility])    Past Surgical History  Procedure Laterality Date  . Coronary artery bypass graft    . Cholecystectomy    . Kidney surgery      kidney removed   Family History  Problem Relation Age of Onset  . Other Mother     [place] Flu  . Heart disease Father    Social History  Substance Use Topics  . Smoking status: Former Smoker    Types: Cigarettes    Quit date: 11/10/1970  . Smokeless tobacco: Never Used  . Alcohol Use: No    Review of Systems  Unable to perform ROS: Mental status change  Constitutional: Positive for fever.  Gastrointestinal: Negative for vomiting.   LEVEL 5 EXCEPTION TO HISTORY    Allergies  Penicillins  Home Medications   Prior to Admission medications    Medication Sig Start Date End Date Taking? Authorizing Provider  acetaminophen (TYLENOL) 500 MG tablet Take 1,000 mg by mouth 2 (two) times daily.    Yes Historical Provider, MD  aspirin 81 MG tablet Take 81 mg by mouth daily.   Yes Historical Provider, MD  calcium carbonate (OS-CAL - DOSED IN MG OF ELEMENTAL CALCIUM) 1250 MG tablet Take 1 tablet by mouth daily with breakfast.   Yes Historical Provider, MD  clopidogrel (PLAVIX) 75 MG tablet Take 75 mg by mouth daily.     Yes Historical Provider, MD  fexofenadine (ALLEGRA) 180 MG tablet Take 180 mg by mouth daily.   Yes Historical Provider, MD  folic acid (FOLVITE) 1 MG tablet Take 1 mg by mouth daily.   Yes Historical Provider, MD  furosemide (LASIX) 20 MG tablet Take 0.5 tablets (10 mg total) by mouth daily. Patient taking differently: Take 20 mg by mouth daily.  01/30/15  Yes Theodis Blaze, MD  methotrexate (RHEUMATREX) 2.5 MG tablet Take 10 mg by mouth every Wednesday.   Yes Historical Provider, MD  Multiple Vitamins-Minerals (MULTIVITAMINS THER. W/MINERALS) TABS Take 1 tablet by mouth daily.     Yes Historical Provider, MD  nitroGLYCERIN (NITROSTAT) 0.4 MG SL tablet Place 0.4 mg under the tongue every 5 (five) minutes as needed for chest pain.   Yes Historical Provider, MD  polyethylene glycol (MIRALAX /  GLYCOLAX) packet Take 17 g by mouth daily as needed for mild constipation.    Yes Historical Provider, MD  SYNTHROID 100 MCG tablet Take 100 mcg by mouth daily before breakfast.  09/19/15  Yes Historical Provider, MD  traMADol (ULTRAM) 50 MG tablet Take one tablet by mouth twice daily for pains PRN Patient taking differently: Take 50 mg by mouth 2 (two) times daily.  09/29/15  Yes Belkys A Regalado, MD   BP 116/65 mmHg  Pulse 80  Temp(Src) 99.5 F (37.5 C) (Rectal)  Resp 16  SpO2 96% Physical Exam  Constitutional: He appears well-developed and well-nourished. He appears lethargic. No distress.  HENT:  Head: Normocephalic and atraumatic.   Eyes: Conjunctivae are normal.  Neck: Neck supple. No tracheal deviation present.  Cardiovascular: Normal rate, regular rhythm and normal heart sounds.   Pulmonary/Chest: Effort normal and breath sounds normal. No respiratory distress.  Abdominal: Soft. He exhibits no distension.  Neurological: He appears lethargic. He is disoriented. GCS eye subscore is 3. GCS verbal subscore is 3. GCS motor subscore is 5.  Skin: Skin is warm and dry.  Psychiatric: He is slowed and withdrawn. He is noncommunicative.    ED Course  Procedures (including critical care time) Labs Review Labs Reviewed  URINALYSIS, ROUTINE W REFLEX MICROSCOPIC (NOT AT [facility]) - Abnormal; Notable for the following:    APPearance TURBID (*)    Hgb urine dipstick MODERATE (*)    Protein, ur 100 (*)    Leukocytes, UA LARGE (*)    All other components within normal limits  URINE MICROSCOPIC-ADD ON - Abnormal; Notable for the following:    Squamous Epithelial / LPF 0-5 (*)    Bacteria, UA MANY (*)    All other components within normal limits  CBG MONITORING, ED - Abnormal; Notable for the following:    Glucose-Capillary 118 (*)    All other components within normal limits  CULTURE, BLOOD (ROUTINE X 2)  CULTURE, BLOOD (ROUTINE X 2)  URINE CULTURE  COMPREHENSIVE METABOLIC PANEL  CBC WITH DIFFERENTIAL/PLATELET  I-STAT CG4 LACTIC ACID, ED  I-STAT TROPOININ, ED    Imaging Review Ct Head Wo Contrast  02/21/2016  CLINICAL DATA:  Altered mental status EXAM: CT HEAD WITHOUT CONTRAST TECHNIQUE: Contiguous axial images were obtained from the base of the skull through the vertex without intravenous contrast. COMPARISON:  11/13/2015 FINDINGS: Bony calvarium is intact. No gross soft tissue abnormality is seen. Diffuse atrophic changes and chronic white matter ischemic changes are seen. These are stable from the previous exam. No findings to suggest acute hemorrhage, acute infarction or space-occupying mass lesion are noted. IMPRESSION:  Chronic atrophic and ischemic changes without acute abnormality. Electronically Signed   By: Inez Catalina M.D.   On: 02/21/2016 12:23   I have personally reviewed and evaluated these images and lab results as part of my medical decision-making.   EKG Interpretation   Date/Time:  Thursday February 21 2016 11:07:26 EDT Ventricular Rate:  80 PR Interval:  269 QRS Duration: 152 QT Interval:  447 QTC Calculation: 516 R Axis:   -95 Text Interpretation:  Sinus rhythm Prolonged PR interval Right bundle  branch block Anterolateral infarct, age indeterminate No significant  change since last tracing Confirmed by Katniss Weedman MD, Kristie Bracewell ([id number]) on  02/21/2016 11:43:10 AM      MDM   Final diagnoses:  Acute encephalopathy  Urinary tract infection with hematuria, site unspecified  Leukocytosis    82 y.o. male presents with Being lethargic and altered since  this morning. He had low-grade temperature on arrival and is minimally interactive. Oriented only to self. Unable to contact family in the area or obtain any further historical information. Urine appears grossly infected, provided Rocephin for apparent urinary tract infection. CT head is negative for ischemic infarct or bleed to suggest underlying neurologic etiology currently.  Pt does not appear septic and has stable vital signs throughout ED course. Continues to have mental status changes. Hospitalist was consulted for admission and will see the patient in the emergency department.     Leo Grosser, MD 02/21/16 [phone]

## 2016-02-21 NOTE — Progress Notes (Signed)
[facility] consult in EPIC [facility], admitting with UTI with 2 admissions in last 6 months, pcp Hal Stoneking

## 2016-02-21 NOTE — Progress Notes (Signed)
[facility] consulted for possible home health services.  EDCM spoke to patient at bedside.  Patient confirms he lives with his wife.  Patient reports he has caretakers from [employer].  Patient unsure of what times of the day caretakers are in the home but reports they are there from 5pm to 9pm and in the morning.  Patient reports he cannot walk.  Patient reports he has a wheelchair and a bedside commode at home.  Patient reports his wife does not drive.  Patient reports his caretakers take him to his doctor's appointments.  Patient reports his caretakers assist with his meals and ADL's.  EDCM provided patient with list of home health agencies in [facility], explained services.  Patient thankful for services.  No further  EDCM needs at this time.

## 2016-02-21 NOTE — Progress Notes (Addendum)
ED CM noted pt with Cm consult for home health services- Pt has services at home but does not know agency  Cm reviewed EPIC chart review to indicate pt sent to snf in 2016 from a [facility] facility Cm consulted various home  Health agencies to find out if pt seen by one of them  Pt is not active with [facility] nor [facility] until September 2016

## 2016-02-21 NOTE — ED Notes (Signed)
Patient gone to CT. 

## 2016-02-21 NOTE — ED Notes (Signed)
Bed: WA08 Expected date:  Expected time:  Means of arrival:  Comments: EMS- 82yo, weakness/septic?

## 2016-02-22 ENCOUNTER — Other Ambulatory Visit: Payer: Self-pay | Admitting: *Deleted

## 2016-02-22 DIAGNOSIS — R531 Weakness: Secondary | ICD-10-CM

## 2016-02-22 DIAGNOSIS — N183 Chronic kidney disease, stage 3 (moderate): Secondary | ICD-10-CM

## 2016-02-22 DIAGNOSIS — N289 Disorder of kidney and ureter, unspecified: Secondary | ICD-10-CM

## 2016-02-22 DIAGNOSIS — M353 Polymyalgia rheumatica: Secondary | ICD-10-CM

## 2016-02-22 LAB — BASIC METABOLIC PANEL
Anion gap: 10 (ref 5–15)
BUN: 26 mg/dL — ABNORMAL HIGH (ref 6–20)
CALCIUM: 8.6 mg/dL — AB (ref 8.9–10.3)
CHLORIDE: 107 mmol/L (ref 101–111)
CO2: 25 mmol/L (ref 22–32)
CREATININE: 1.49 mg/dL — AB (ref 0.61–1.24)
GFR calc non Af Amer: 39 mL/min — ABNORMAL LOW (ref >60)
GFR, EST AFRICAN AMERICAN: 45 mL/min — AB (ref >60)
GLUCOSE: 117 mg/dL — AB (ref 65–99)
Potassium: 3.8 mmol/L (ref 3.5–5.1)
Sodium: 142 mmol/L (ref 135–145)

## 2016-02-22 LAB — CBC
HCT: 38.7 % — ABNORMAL LOW (ref 39.0–52.0)
HEMOGLOBIN: 13 g/dL (ref 13.0–17.0)
MCH: 33.2 pg (ref 26.0–34.0)
MCHC: 33.6 g/dL (ref 30.0–36.0)
MCV: 98.7 fL (ref 78.0–100.0)
PLATELETS: 196 10*3/uL (ref 150–400)
RBC: 3.92 MIL/uL — AB (ref 4.22–5.81)
RDW: 14.4 % (ref 11.5–15.5)
WBC: 8.7 10*3/uL (ref 4.0–10.5)

## 2016-02-22 NOTE — Evaluation (Signed)
Physical Therapy Evaluation Patient Details Name: Barry Wright MRN: [id number] DOB: 1920-05-25 Today's Date: 02/22/2016   History of Present Illness  Barry Wright is a 82 y.o. male, 82 y.o. male with a history of CAD, CHF, PMR, Stage III CKD who was brought to the ED due to increased Somnolence and lethargy, patient lives with wife at home, wheelchair dependent, seen by caregiver who noticed him to be more lethargic than baseline, as well reports poor appetite.  Found to have UTI.  Clinical Impression  Patient presents with decreased independence with mobility due to deficits listed in PT problem list.  He will benefit from skilled PT in the acute setting to allow d/c home with follow up HHPT.  Patient at baseline has assist for sit to stand for ADL's and uses transfer board to [facility] for toileting or from lift chair to w/c.  Was attending outpatient PT, but will need to work back to that via [facility] first.  Feels a hospital bed would be beneficial as he has been sleeping in the lift chair.    Follow Up Recommendations Home health PT;Supervision for mobility/OOB    Equipment Recommendations  Hospital bed    Recommendations for Other Services       Precautions / Restrictions Precautions Precautions: Fall      Mobility  Bed Mobility Overal bed mobility: Needs Assistance Bed Mobility: Supine to Sit     Supine to sit: Max assist     General bed mobility comments: assist to guide legs off EOB and to lift trunk upright, assist wtih pad to scoot to EOB  Transfers Overall transfer level: Needs assistance Equipment used: Rolling walker (2 wheeled) Transfers: Sit to/from [facility] Sit to Stand: +2 physical assistance;Mod assist Stand pivot transfers: +2 physical assistance;Max assist       General transfer comment: assist to stand from elevated height of bed and to pivot to chair with walker pt fatigued and requested to sit prior to backing up to chair, so brought  chair up to him and assisted to safely lower into chair and sit with cues for hand placement  Ambulation/Gait                Stairs            Wheelchair Mobility    Modified Rankin (Stroke Patients Only)       Balance Overall balance assessment: Needs assistance Sitting-balance support: Feet supported Sitting balance-Leahy Scale: Fair Sitting balance - Comments: able to maintain sitting once assisted to EOB   Standing balance support: Bilateral upper extremity supported Standing balance-Leahy Scale: Poor Standing balance comment: assist with walker and external help for balance                             Pertinent Vitals/Pain Pain Assessment: No/denies pain    Home Living Family/patient expects to be discharged to:: Private residence Living Arrangements: Spouse/significant other Available Help at Discharge: Personal care attendant (caregivers ~10a-1p, 5-9pm) Type of Home: House Home Access: Ramped entrance     Home Layout: Able to live on main level with bedroom/bathroom Home Equipment: Walker - 2 wheels;Bedside commode;Shower seat;Grab bars - toilet;Grab bars - tub/shower;Wheelchair - manual (lift chair, transfer board) Additional Comments: was attending outpatient PT at [facility].    Prior Function Level of Independence: Needs assistance   Gait / Transfers Assistance Needed: non ambulatory. Varying level of performance at baseline per aide: can sometimes  pivot from lift chair to [place], sometimes he cannot  ADL's / Homemaking Assistance Needed: assist with sponge bath, dependent for housekeeping, meal prep        Hand Dominance   Dominant Hand: Right    Extremity/Trunk Assessment   Upper Extremity Assessment: RUE deficits/detail;LUE deficits/detail RUE Deficits / Details: AROM shoulder at least 90 degrees, strength grossly 4-/5     LUE Deficits / Details: AAROM shoulder flexion at least 90, strength grossly 4-/5   Lower Extremity  Assessment: RLE deficits/detail;LLE deficits/detail RLE Deficits / Details: AAROM WFL, strength limited about 3+/5 (reports nerve damage in R) LLE Deficits / Details: AROM WFL, strength grossly 4-/5  Cervical / Trunk Assessment: Kyphotic  Communication   Communication: HOH  Cognition Arousal/Alertness: Awake/alert Behavior During Therapy: WFL for tasks assessed/performed Overall Cognitive Status: History of cognitive impairments - at baseline                      General Comments General comments (skin integrity, edema, etc.): wife in the room in w/c; very insistent that he can do more than he does since he only has one bad leg; pt states walked 5' a month ago    Exercises General Exercises - Lower Extremity Ankle Circles/Pumps: AROM;Both;5 reps;Supine Heel Slides: AAROM;Both;5 reps;Supine      Assessment/Plan    PT Assessment Patient needs continued PT services  PT Diagnosis Generalized weakness;Difficulty walking   PT Problem List Decreased strength;Decreased activity tolerance;Decreased knowledge of use of DME;Decreased balance;Decreased mobility;Decreased range of motion;Decreased safety awareness  PT Treatment Interventions DME instruction;Balance training;Gait training;Functional mobility training;Patient/family education;Therapeutic activities;Therapeutic exercise;Wheelchair mobility training   PT Goals (Current goals can be found in the Care Plan section) Acute Rehab PT Goals Patient Stated Goal: To go home, get back to walking PT Goal Formulation: With patient/family Time For Goal Achievement: 02/29/16 Potential to Achieve Goals: Fair    Frequency Min 3X/week   Barriers to discharge        Co-evaluation               End of Session Equipment Utilized During Treatment: Gait belt Activity Tolerance: Patient limited by fatigue Patient left: with call bell/phone within reach;in chair;with family/visitor present;with chair alarm set            Time: 1110-1145 PT Time Calculation (min) (ACUTE ONLY): 35 min   Charges:   PT Evaluation $PT Eval Moderate Complexity: 1 Procedure PT Treatments $Therapeutic Activity: 8-22 mins   PT G Codes:        Reginia Naas 03/12/16, 12:12 PM  Magda Kiel, [place] 2016-03-12

## 2016-02-22 NOTE — Consult Note (Signed)
   [facility] CM Inpatient Consult   02/22/2016  Barry Wright 10/23/1920 [id number]    [facility] Care Management referral received. Went to bedside to speak with [name] to explain and offer [place] Management services. He is agreeable and written consent obtained. He reports he lives with his wife and has caregiver assistance 7 days a week. He denies having home health currently. Explained to him that [place] Management will not interfere or replace with any services he will have arranged by inpatient RNCM, such as home health or by inpatient Licensed CSW. Confirmed his Primary Care MD is Dr. Felipa Eth. [name] denies having issues with transportation or with affording medications. He reports his caregivers take him to his appointments. Informed patient that he will receive post hospital transition of care calls and will be evaluated for monthly home visits. [facility] Care Management packet and contact information left at bedside. Will make inpatient RNCM aware that [facility] Management to follow up with patient post discharge. Confirmed best contact number as [phone].   Will request to be assigned to [place] for history of CHF.    Marthenia Rolling, MSN-Ed, RN,BSN [facility] Liaison [phone]

## 2016-02-22 NOTE — Progress Notes (Signed)
Patient Demographics  Barry Wright, is a 82 y.o. male, DOB - [birth year], [id number]  Admit date - 02/21/2016   Admitting Physician Albertine Patricia, MD  Outpatient Primary MD for the patient is Mathews Argyle, MD  LOS - 1   Chief Complaint  Patient presents with  . Altered Mental Status       Admission HPI/Brief narrative: 82 y.o. male with a history of CAD, CHF, PMR, Stage III CKD who was brought to the ED due to increased Somnolence and lethargy, workup significant for UTI. Subjective:   Barry Wright today has, No headache, No chest pain, No abdominal pain -reports she is feeling much better today . Assessment & Plan    Principal Problem:   Acute encephalopathy Active Problems:   UTI (lower urinary tract infection)   AKI (acute kidney injury) ([place])   Transient cerebral ischemia   Polymyalgia rheumatica (HCC)   Chronic diastolic heart failure (HCC)   Weakness  Acute encephalopathy - Metabolic,  related to dehydration and UTI. -  CT head with no acute finding - continue with UTI treatment and IV fluids.  UTI - Continue with IV Rocephin, follow on urine cultures  Acute kidney injury on CK D stage III -Likely prerenal secondary to volume depletion, improving with IV fluids, will decrease rate given started to develop pedal edema.  Weakness - We'll consult PT  History of TIA, CAD and carotid artery disease - Denies any chest pain or shortness of breath, continue with aspirin and Plavix  Polymyalgia rheumatica - We'll hold methotrexate secondary to acute illness, when resume on more stable  Chronic diastolic heart failure - Appears to be dehydrated, continue to hold Lasix, monitor closely as an IV hydration  Code Status: Full, confirmed by patient  Family Communication: none at bedside  Disposition Plan: pending PT evaluation   Procedures  none   Consults    none   Medications  Scheduled Meds: . acetaminophen  1,000 mg Oral BID  . aspirin  81 mg Oral Daily  . calcium carbonate  1 tablet Oral Q breakfast  . cefTRIAXone (ROCEPHIN)  IV  1 g Intravenous Q24H  . clopidogrel  75 mg Oral Daily  . folic acid  1 mg Oral Daily  . heparin  5,000 Units Subcutaneous 3 times per day  . levothyroxine  100 mcg Oral QAC breakfast  . multivitamin with minerals  1 tablet Oral Daily   Continuous Infusions: . sodium chloride 75 mL/hr at 02/22/16 0818   PRN Meds:.acetaminophen **OR** acetaminophen, nitroGLYCERIN, polyethylene glycol  DVT Prophylaxis Heparin - SCDs  Lab Results  Component Value Date   PLT 196 02/22/2016    Antibiotics    Anti-infectives    Start     Dose/Rate Route Frequency Ordered Stop   02/22/16 1400  cefTRIAXone (ROCEPHIN) 1 g in dextrose 5 % 50 mL IVPB     1 g 100 mL/hr over 30 Minutes Intravenous Every 24 hours 02/21/16 1629     02/21/16 1330  cefTRIAXone (ROCEPHIN) 1 g in dextrose 5 % 50 mL IVPB     1 g 100 mL/hr over 30 Minutes Intravenous  Once 02/21/16 1326 02/21/16 1513          Objective:  Filed Vitals:   02/21/16 1613 02/21/16 1637 02/21/16 2154 02/22/16 0551  BP:  142/57 119/61 159/75  Pulse:  72 69 72  Temp: 98.7 F (37.1 C)  97.6 F (36.4 C) 97.5 F (36.4 C)  TempSrc: Oral Oral Oral Oral  Resp:  20 18 18   Height:  5\' 11"  (1.803 m)    Weight:  81.3 kg (179 lb 3.7 oz)    SpO2:  99% 96% 96%    Wt Readings from Last 3 Encounters:  02/21/16 81.3 kg (179 lb 3.7 oz)  11/13/15 88.451 kg (195 lb)  09/29/15 87.4 kg (192 lb 10.9 oz)     Intake/Output Summary (Last 24 hours) at 02/22/16 1147 Last data filed at 02/22/16 0900  Gross per 24 hour  Intake    360 ml  Output    400 ml  Net    -40 ml     Physical Exam  Awake Alert, Oriented [employer].AT,PERRAL Supple Neck,No JVD,  Symmetrical Chest wall movement, Good air movement bilaterally, CTAB RRR,No Gallops,Rubs or new Murmurs, No Parasternal  Heave +ve B.Sounds, Abd Soft, No tenderness, No organomegaly appriciated, No rebound - guarding or rigidity. No Cyanosis, Clubbing , trace pedal edema, No new Rash or bruise     Data Review   Micro Results No results found for this or any previous visit (from the past 240 hour(s)).  Radiology Reports Ct Head Wo Contrast  02/21/2016  CLINICAL DATA:  Altered mental status EXAM: CT HEAD WITHOUT CONTRAST TECHNIQUE: Contiguous axial images were obtained from the base of the skull through the vertex without intravenous contrast. COMPARISON:  11/13/2015 FINDINGS: Bony calvarium is intact. No gross soft tissue abnormality is seen. Diffuse atrophic changes and chronic white matter ischemic changes are seen. These are stable from the previous exam. No findings to suggest acute hemorrhage, acute infarction or space-occupying mass lesion are noted. IMPRESSION: Chronic atrophic and ischemic changes without acute abnormality. Electronically Signed   By: Inez Catalina M.D.   On: 02/21/2016 12:23     CBC  Recent Labs Lab 02/21/16 1311 02/22/16 0457  WBC 12.0* 8.7  HGB 13.7 13.0  HCT 40.4 38.7*  PLT 195 196  MCV 97.3 98.7  MCH 33.0 33.2  MCHC 33.9 33.6  RDW 14.2 14.4  LYMPHSABS 2.3  --   MONOABS 1.1*  --   EOSABS 0.1  --   BASOSABS 0.0  --     Chemistries   Recent Labs Lab 02/21/16 1311 02/22/16 0457  NA 142 142  K 4.4 3.8  CL 105 107  CO2 27 25  GLUCOSE 116* 117*  BUN 27* 26*  CREATININE 1.79* 1.49*  CALCIUM 8.7* 8.6*  AST 25  --   ALT 12*  --   ALKPHOS 69  --   BILITOT 1.0  --    ------------------------------------------------------------------------------------------------------------------ estimated creatinine clearance is 33 mL/min (by C-G formula based on Cr of 1.49). ------------------------------------------------------------------------------------------------------------------ No results for input(s): HGBA1C in the last 72  hours. ------------------------------------------------------------------------------------------------------------------ No results for input(s): CHOL, HDL, LDLCALC, TRIG, CHOLHDL, LDLDIRECT in the last 72 hours. ------------------------------------------------------------------------------------------------------------------ No results for input(s): TSH, T4TOTAL, T3FREE, THYROIDAB in the last 72 hours.  Invalid input(s): FREET3 ------------------------------------------------------------------------------------------------------------------ No results for input(s): VITAMINB12, FOLATE, FERRITIN, TIBC, IRON, RETICCTPCT in the last 72 hours.  Coagulation profile No results for input(s): INR, PROTIME in the last 168 hours.  No results for input(s): DDIMER in the last 72 hours.  Cardiac Enzymes No results for input(s): CKMB, TROPONINI, MYOGLOBIN in the last  168 hours.  Invalid input(s): CK ------------------------------------------------------------------------------------------------------------------ Invalid input(s): POCBNP     Time Spent in minutes   25 minutes   Katielynn Horan M.D on 02/22/2016 at 11:47 AM  Between 7am to 7pm - Pager - [phone]  After 7pm go to [email or web address] - password [facility]  [name]   Office  [phone]

## 2016-02-23 LAB — CBC
HEMATOCRIT: 37.5 % — AB (ref 39.0–52.0)
HEMOGLOBIN: 12.6 g/dL — AB (ref 13.0–17.0)
MCH: 33.2 pg (ref 26.0–34.0)
MCHC: 33.6 g/dL (ref 30.0–36.0)
MCV: 98.9 fL (ref 78.0–100.0)
Platelets: 191 10*3/uL (ref 150–400)
RBC: 3.79 MIL/uL — ABNORMAL LOW (ref 4.22–5.81)
RDW: 14.4 % (ref 11.5–15.5)
WBC: 7.6 10*3/uL (ref 4.0–10.5)

## 2016-02-23 LAB — URINE CULTURE: Culture: >=100000 — AB

## 2016-02-23 LAB — BASIC METABOLIC PANEL
Anion gap: 12 (ref 5–15)
BUN: 21 mg/dL — AB (ref 6–20)
CHLORIDE: 108 mmol/L (ref 101–111)
CO2: 24 mmol/L (ref 22–32)
CREATININE: 1.39 mg/dL — AB (ref 0.61–1.24)
Calcium: 8.8 mg/dL — ABNORMAL LOW (ref 8.9–10.3)
GFR calc non Af Amer: 42 mL/min — ABNORMAL LOW (ref >60)
GFR, EST AFRICAN AMERICAN: 49 mL/min — AB (ref >60)
GLUCOSE: 116 mg/dL — AB (ref 65–99)
Potassium: 3.8 mmol/L (ref 3.5–5.1)
Sodium: 144 mmol/L (ref 135–145)

## 2016-02-23 MED ORDER — VANCOMYCIN HCL IN DEXTROSE 1-5 GM/200ML-% IV SOLN
1000.0000 mg | INTRAVENOUS | Status: DC
  Administered 2016-02-23: 1000 mg via INTRAVENOUS
  Filled 2016-02-23: qty 200

## 2016-02-23 MED ORDER — VANCOMYCIN HCL IN DEXTROSE 1-5 GM/200ML-% IV SOLN
1000.0000 mg | Freq: Once | INTRAVENOUS | Status: AC
  Administered 2016-02-23: 1000 mg via INTRAVENOUS
  Filled 2016-02-23: qty 200

## 2016-02-23 NOTE — Progress Notes (Signed)
Patient Demographics  Barry Wright, is a 82 y.o. male, DOB - 1920/04/21, [id number]  Admit date - 02/21/2016   Admitting Physician Barry Patricia, MD  Outpatient Primary MD for the patient is Barry Argyle, MD  LOS - 2   Chief Complaint  Patient presents with  . Altered Mental Status       Admission HPI/Brief narrative: 82 y.o. male with a history of CAD, CHF, PMR, Stage III CKD who was brought to the ED due to increased Somnolence and lethargy, workup significant for UTI. Subjective:   Barry Wright today has, No headache, No chest pain, No abdominal pain -reports she is feeling much better today . Assessment & Plan    Principal Problem:   Acute encephalopathy Active Problems:   UTI (lower urinary tract infection)   AKI (acute kidney injury) ([place])   Transient cerebral ischemia   Polymyalgia rheumatica (HCC)   Chronic diastolic heart failure (HCC)   Weakness  Acute encephalopathy - Metabolic,  related to dehydration and UTI. -  CT head with no acute finding - continue with UTI treatment and IV fluids.  UTI -Treated initially with IV Rocephin, urine culture growing enterococcus, patient with better allergy, will start on IV vancomycin pending sensitivities  Acute kidney injury on CK D stage III -Likely prerenal secondary to volume depletion, improving with IV fluids,  Weakness - DT consultation appreciated  History of TIA, CAD and carotid artery disease - Denies any chest pain or shortness of breath, continue with aspirin and Plavix  Polymyalgia rheumatica - We'll hold methotrexate secondary to acute illness, when resume on more stable  Chronic diastolic heart failure - Appears to be dehydrated, continue to hold Lasix, monitor closely as an IV hydration  Code Status: Full, confirmed by patient  Family Communication: none at bedside  Disposition Plan: Home  with home PT when medically stable, awaiting urine culture sensitivities   Procedures  none   Consults   none   Medications  Scheduled Meds: . acetaminophen  1,000 mg Oral BID  . aspirin  81 mg Oral Daily  . calcium carbonate  1 tablet Oral Q breakfast  . clopidogrel  75 mg Oral Daily  . folic acid  1 mg Oral Daily  . heparin  5,000 Units Subcutaneous 3 times per day  . levothyroxine  100 mcg Oral QAC breakfast  . multivitamin with minerals  1 tablet Oral Daily  . vancomycin  1,000 mg Intravenous Q24H   Continuous Infusions: . sodium chloride Stopped (02/23/16 0805)   PRN Meds:.acetaminophen **OR** acetaminophen, nitroGLYCERIN, polyethylene glycol  DVT Prophylaxis Heparin - SCDs  Lab Results  Component Value Date   PLT 191 02/23/2016    Antibiotics    Anti-infectives    Start     Dose/Rate Route Frequency Ordered Stop   02/23/16 2000  vancomycin (VANCOCIN) IVPB 1000 mg/200 mL premix     1,000 mg 200 mL/hr over 60 Minutes Intravenous Every 24 hours 02/23/16 0730     02/23/16 0800  vancomycin (VANCOCIN) IVPB 1000 mg/200 mL premix     1,000 mg 200 mL/hr over 60 Minutes Intravenous  Once 02/23/16 0730 02/23/16 0905   02/22/16 1400  cefTRIAXone (ROCEPHIN) 1 g in dextrose 5 % 50 mL  IVPB  Status:  Discontinued     1 g 100 mL/hr over 30 Minutes Intravenous Every 24 hours 02/21/16 1629 02/23/16 0709   02/21/16 1330  cefTRIAXone (ROCEPHIN) 1 g in dextrose 5 % 50 mL IVPB     1 g 100 mL/hr over 30 Minutes Intravenous  Once 02/21/16 1326 02/21/16 1513          Objective:   Filed Vitals:   02/22/16 0551 02/22/16 1400 02/22/16 2056 02/23/16 0615  BP: 159/75 152/63 146/65 149/64  Pulse: 72 87 83 70  Temp: 97.5 F (36.4 C) 97.5 F (36.4 C) 98 F (36.7 C) 97.6 F (36.4 C)  TempSrc: Oral Oral Oral Oral  Resp: 18 20 20 18   Height:      Weight:      SpO2: 96% 99% 98% 95%    Wt Readings from Last 3 Encounters:  02/21/16 81.3 kg (179 lb 3.7 oz)  11/13/15 88.451  kg (195 lb)  09/29/15 87.4 kg (192 lb 10.9 oz)     Intake/Output Summary (Last 24 hours) at 02/23/16 1048 Last data filed at 02/23/16 0805  Gross per 24 hour  Intake 1596.67 ml  Output    725 ml  Net 871.67 ml     Physical Exam  Awake Alert, Oriented [place].AT,PERRAL Supple Neck,No JVD,  Symmetrical Chest wall movement, Good air movement bilaterally, CTAB RRR,No Gallops,Rubs or new Murmurs, No Parasternal Heave +ve B.Sounds, Abd Soft, No tenderness, No organomegaly appriciated, No rebound - guarding or rigidity. No Cyanosis, Clubbing , trace pedal edema, No new Rash or bruise     Data Review   Micro Results Recent Results (from the past 240 hour(s))  Urine culture     Status: Abnormal (Preliminary result)   Collection Time: 02/21/16 11:56 AM  Result Value Ref Range Status   Specimen Description URINE, CATHETERIZED  Final   Special Requests NONE  Final   Culture >=100,000 COLONIES/mL ENTEROCOCCUS SPECIES (A)  Final   Report Status PENDING  Incomplete  Blood Culture (routine x 2)     Status: None (Preliminary result)   Collection Time: 02/21/16  1:12 PM  Result Value Ref Range Status   Specimen Description BLOOD RIGHT ANTECUBITAL  Final   Special Requests BOTTLES DRAWN AEROBIC AND ANAEROBIC 5 CC EA  Final   Culture   Final    NO GROWTH < 24 HOURS Performed at [facility]    Report Status PENDING  Incomplete  Blood Culture (routine x 2)     Status: None (Preliminary result)   Collection Time: 02/21/16  1:13 PM  Result Value Ref Range Status   Specimen Description BLOOD RIGHT HAND  Final   Special Requests BOTTLES DRAWN AEROBIC AND ANAEROBIC 5 CC EA  Final   Culture   Final    NO GROWTH < 24 HOURS Performed at [facility]    Report Status PENDING  Incomplete    Radiology Reports Ct Head Wo Contrast  02/21/2016  CLINICAL DATA:  Altered mental status EXAM: CT HEAD WITHOUT CONTRAST TECHNIQUE: Contiguous axial images were obtained from the base of the  skull through the vertex without intravenous contrast. COMPARISON:  11/13/2015 FINDINGS: Bony calvarium is intact. No gross soft tissue abnormality is seen. Diffuse atrophic changes and chronic white matter ischemic changes are seen. These are stable from the previous exam. No findings to suggest acute hemorrhage, acute infarction or space-occupying mass lesion are noted. IMPRESSION: Chronic atrophic and ischemic changes without acute abnormality. Electronically Signed  By: Inez Catalina M.D.   On: 02/21/2016 12:23     CBC  Recent Labs Lab 02/21/16 1311 02/22/16 0457 02/23/16 0457  WBC 12.0* 8.7 7.6  HGB 13.7 13.0 12.6*  HCT 40.4 38.7* 37.5*  PLT 195 196 191  MCV 97.3 98.7 98.9  MCH 33.0 33.2 33.2  MCHC 33.9 33.6 33.6  RDW 14.2 14.4 14.4  LYMPHSABS 2.3  --   --   MONOABS 1.1*  --   --   EOSABS 0.1  --   --   BASOSABS 0.0  --   --     Chemistries   Recent Labs Lab 02/21/16 1311 02/22/16 0457 02/23/16 0457  NA 142 142 144  K 4.4 3.8 3.8  CL 105 107 108  CO2 27 25 24   GLUCOSE 116* 117* 116*  BUN 27* 26* 21*  CREATININE 1.79* 1.49* 1.39*  CALCIUM 8.7* 8.6* 8.8*  AST 25  --   --   ALT 12*  --   --   ALKPHOS 69  --   --   BILITOT 1.0  --   --    ------------------------------------------------------------------------------------------------------------------ estimated creatinine clearance is 35.4 mL/min (by C-G formula based on Cr of 1.39). ------------------------------------------------------------------------------------------------------------------ No results for input(s): HGBA1C in the last 72 hours. ------------------------------------------------------------------------------------------------------------------ No results for input(s): CHOL, HDL, LDLCALC, TRIG, CHOLHDL, LDLDIRECT in the last 72 hours. ------------------------------------------------------------------------------------------------------------------ No results for input(s): TSH, T4TOTAL, T3FREE,  THYROIDAB in the last 72 hours.  Invalid input(s): FREET3 ------------------------------------------------------------------------------------------------------------------ No results for input(s): VITAMINB12, FOLATE, FERRITIN, TIBC, IRON, RETICCTPCT in the last 72 hours.  Coagulation profile No results for input(s): INR, PROTIME in the last 168 hours.  No results for input(s): DDIMER in the last 72 hours.  Cardiac Enzymes No results for input(s): CKMB, TROPONINI, MYOGLOBIN in the last 168 hours.  Invalid input(s): CK ------------------------------------------------------------------------------------------------------------------ Invalid input(s): POCBNP     Time Spent in minutes   25 minutes   Jamilee Lafosse M.D on 02/23/2016 at 10:48 AM  Between 7am to 7pm - Pager - [phone]  After 7pm go to [email or web address] - password [facility]  [name]   Office  [phone]

## 2016-02-23 NOTE — Progress Notes (Signed)
Pharmacy Antibiotic Note  Barry Wright is a 82 y.o. male admitted on 02/21/2016 with UTI d/t enterococcus.  Originally on ceftriaxone, now Pharmacy has been consulted for vancomycin dosing.  Plan:  Vancomycin 1000 mg IV now, then 1000 mg IV q24 hr to start in 12 hr; goal trough 10-15 mcg/mL  Measure vancomycin trough levels at steady state as indicated   Height: 5\' 11"  (180.3 cm) Weight: 179 lb 3.7 oz (81.3 kg) IBW/kg (Calculated) : 75.3  Temp (24hrs), Avg:97.7 F (36.5 C), Min:97.5 F (36.4 C), Max:98 F (36.7 C)   Recent Labs Lab 02/21/16 1311 02/21/16 1324 02/22/16 0457 02/23/16 0457  WBC 12.0*  --  8.7 7.6  CREATININE 1.79*  --  1.49* 1.39*  LATICACIDVEN  --  1.21  --   --     Estimated Creatinine Clearance: 35.4 mL/min (by C-G formula based on Cr of 1.39).    Allergies  Allergen Reactions  . Penicillins Rash    Has patient had a PCN reaction causing immediate rash, facial/tongue/throat swelling, SOB or lightheadedness with hypotension: Unknown Has patient had a PCN reaction causing severe rash involving mucus membranes or skin necrosis: Unknown Has patient had a PCN reaction that required hospitalization: Unknown Has patient had a PCN reaction occurring within the last 10 years: Unknown If all of the above answers are "NO", then may proceed with Cephalosporin use.     Antimicrobials this admission: CTX 4/14 x 1 vancomycin 4/15 >>   Dose adjustments this admission: ---  Microbiology results: 4/13 BCx: ngtd 4/13 UCx: > 100k Enterococcus, sens pending    Thank you for allowing pharmacy to be a part of this patient's care.  Reuel Boom, PharmD, BCPS Pager: [phone] 02/23/2016, 8:46 AM

## 2016-02-24 MED ORDER — LEVOFLOXACIN 250 MG PO TABS: 250.0000 mg | ORAL_TABLET | Freq: Every day | ORAL | Status: DC

## 2016-02-24 NOTE — Progress Notes (Signed)
DC instructions reviewed w/ pt, wife, and caregiver. All verbalize understanding and all questions answered. Pt d/c in w/c in stable condition by NT to caregiver's car. Pt in possession of d/c instructions, script, and all personal belongings.

## 2016-02-24 NOTE — Discharge Summary (Signed)
Barry Wright, is a 82 y.o. male  DOB 11/18/1919  MRN [id number].  Admission date:  02/21/2016  Admitting Physician  Albertine Patricia, MD  Discharge Date:  02/24/2016   Primary MD  Mathews Argyle, MD  Recommendations for primary care physician for things to follow:  _ please check CBC, BMP during next visit.   Admission Diagnosis  Leukocytosis [D72.829] Acute encephalopathy [G93.40] Urinary tract infection with hematuria, site unspecified [N39.0, R31.9]   Discharge Diagnosis  Leukocytosis [D72.829] Acute encephalopathy [G93.40] Urinary tract infection with hematuria, site unspecified [N39.0, R31.9]   Principal Problem:   Acute encephalopathy Active Problems:   UTI (lower urinary tract infection)   AKI (acute kidney injury) (HCC)   Transient cerebral ischemia   Polymyalgia rheumatica (HCC)   Chronic diastolic heart failure (HCC)   Weakness      Past Medical History  Diagnosis Date  . Chronic back pain   . Hypercholesteremia   . Hypothyroidism   . CAD (coronary artery disease)   . Chronic kidney disease (CKD), stage III (moderate)   . Polymyalgia rheumatica ([place])   . Left bundle branch block   . Hx of CABG   . History of nephrectomy   . Internal carotid artery stenosis   . Myocardial infarction ([place])   . CHF (congestive heart failure) ([place])   . Stroke [facility])     Past Surgical History  Procedure Laterality Date  . Coronary artery bypass graft    . Cholecystectomy    . Kidney surgery      kidney removed       History of present illness and  Hospital Course:     Kindly see H&P for history of present illness and admission details, please review complete Labs, Consult reports and Test reports for all details in brief  HPI  from the history and physical done on the day of admission 02/21/2016  Barry Wright is a 82 y.o. male, 82 y.o. male with a history of CAD, CHF,  PMR, Stage III CKD who was brought to the ED due to increased Somnolence and lethargy, patient lives with wife at home, wheelchair dependent, seen by caregiver who noticed him to be more lethargic than baseline, as well reports poor appetite, currently no family member at bedside, history was obtained from ED physician, she denies any focal deficits, tingling or numbness, currently patient is more awake than presentation to ED, patient with low-grade temperature 99.4, but he denies any cough or productive sputum, urinalysis was positive, urine culture was sent, patient started on IV Rocephin, hospitalist was called to admit.   Hospital Course  82 y.o. male with a history of CAD, CHF, PMR, Stage III CKD who was brought to the ED due to increased Somnolence and lethargy, workup significant for UTI.  Acute encephalopathy - Metabolic, related to dehydration and UTI. - CT head with no acute finding - Resolved, back to baseline  UTI -Treated initially with IV Rocephin, with significant improvement, but then urine culture growing enterococcus, patient with  penicillin allergy, so IV Rocephin was changed to IV vancomycin, discussed with ID Dr. Megan Salon appropriate by mouth until discharge , given the fact patient was improving on Rocephin , so enterococcus is unlikely that true pathogen for his UTI , so he can be discharged on levofloxacin for another 3 days .  Acute kidney injury on CK D stage III -Likely prerenal secondary to volume depletion, improvedd with IV fluids,  Weakness - PT at home  History of TIA, CAD and carotid artery disease - Denies any chest pain or shortness of breath, continue with aspirin and Plavix  Polymyalgia rheumatica - Resume home medication on discharge  Chronic diastolic heart failure - Appears to be dehydrated admission, was on IV fluids, and Lasix has been held, will resume Lasix on discharge   Discharge Condition:  stable   Follow UP  Follow-up Information     Follow up with Mathews Argyle, MD. Schedule an appointment as soon as possible for a visit in 1 week.   Specialty:  Internal Medicine   Contact information:   [address] [place] [address] [phone]         Discharge Instructions  and  Discharge Medications     Discharge Instructions    AMB Referral to [facility] [place] Management    Complete by:  As directed   Please assign to [place] for CHF disease and symptom management and teaching. Written consent obtained. Currently at [facility]. Please call with questions. Thanks. Marthenia Rolling, [employer], [place] [id number]  Reason for consult:  Please assign to [facility] RNCM  Diagnoses of:  Heart Failure  Expected date of contact:  1-3 days (reserved for hospital discharges)     Discharge instructions    Complete by:  As directed   Follow with Primary MD Mathews Argyle, MD in 7 days   Get CBC, CMP,  checked  by Primary MD next visit.    Activity: As tolerated with Full fall precautions use walker/cane & assistance as needed   Disposition Home    Diet: Heart Healthy, with feeding assistance and aspiration precautions.  For Heart failure patients - Check your Weight same time everyday, if you gain over 2 pounds, or you develop in leg swelling, experience more shortness of breath or chest pain, call your Primary MD immediately. Follow Cardiac Low Salt Diet and 1.5 lit/day fluid restriction.   On your next visit with your primary care physician please Get Medicines reviewed and adjusted.   Please request your Prim.MD to go over all Hospital Tests and Procedure/Radiological results at the follow up, please get all Hospital records sent to your Prim MD by signing hospital release before you go home.   If you experience worsening of your admission symptoms, develop shortness of breath, life threatening emergency, suicidal or homicidal thoughts you must seek medical  attention immediately by calling 911 or calling your MD immediately  if symptoms less severe.  You Must read complete instructions/literature along with all the possible adverse reactions/side effects for all the Medicines you take and that have been prescribed to you. Take any new Medicines after you have completely understood and accpet all the possible adverse reactions/side effects.   Do not drive, operating heavy machinery, perform activities at heights, swimming or participation in water activities or provide baby sitting services if your were admitted for syncope or siezures until you have seen by Primary MD or a Neurologist and advised to do so again.  Do not drive when taking Pain medications.    Do not take more than prescribed Pain, Sleep and Anxiety Medications  Special Instructions: If you have smoked or chewed Tobacco  in the last 2 yrs please stop smoking, stop any regular Alcohol  and or any Recreational drug use.  Wear Seat belts while driving.   Please note  You were cared for by a hospitalist during your hospital stay. If you have any questions about your discharge medications or the care you received while you were in the hospital after you are discharged, you can call the unit and asked to speak with the hospitalist on call if the hospitalist that took care of you is not available. Once you are discharged, your primary care physician will handle any further medical issues. Please note that NO REFILLS for any discharge medications will be authorized once you are discharged, as it is imperative that you return to your primary care physician (or establish a relationship with a primary care physician if you do not have one) for your aftercare needs so that they can reassess your need for medications and monitor your lab values.     Increase activity slowly    Complete by:  As directed             Medication List    TAKE these medications        acetaminophen 500 MG  tablet  Commonly known as:  TYLENOL  Take 1,000 mg by mouth 2 (two) times daily.     aspirin 81 MG tablet  Take 81 mg by mouth daily.     calcium carbonate 1250 (500 Ca) MG tablet  Commonly known as:  OS-CAL - dosed in mg of elemental calcium  Take 1 tablet by mouth daily with breakfast.     clopidogrel 75 MG tablet  Commonly known as:  PLAVIX  Take 75 mg by mouth daily.     fexofenadine 180 MG tablet  Commonly known as:  ALLEGRA  Take 180 mg by mouth daily.     folic acid 1 MG tablet  Commonly known as:  FOLVITE  Take 1 mg by mouth daily.     furosemide 20 MG tablet  Commonly known as:  LASIX  Take 0.5 tablets (10 mg total) by mouth daily.     levofloxacin 250 MG tablet  Commonly known as:  LEVAQUIN  Take 1 tablet (250 mg total) by mouth daily.  Start taking on:  02/25/2016     methotrexate 2.5 MG tablet  Commonly known as:  RHEUMATREX  Take 10 mg by mouth every Wednesday.     multivitamins ther. w/minerals Tabs tablet  Take 1 tablet by mouth daily.     nitroGLYCERIN 0.4 MG SL tablet  Commonly known as:  NITROSTAT  Place 0.4 mg under the tongue every 5 (five) minutes as needed for chest pain.     polyethylene glycol packet  Commonly known as:  MIRALAX / GLYCOLAX  Take 17 g by mouth daily as needed for mild constipation.     SYNTHROID 100 MCG tablet  Generic drug:  levothyroxine  Take 100 mcg by mouth daily before breakfast.     traMADol 50 MG tablet  Commonly known as:  ULTRAM  Take one tablet by mouth twice daily for pains PRN          Diet and Activity recommendation: See Discharge Instructions above   Consults obtained -  none   Major procedures and Radiology Reports -  PLEASE review detailed and final reports for all details, in brief -      Ct Head Wo Contrast  02/21/2016  CLINICAL DATA:  Altered mental status EXAM: CT HEAD WITHOUT CONTRAST TECHNIQUE: Contiguous axial images were obtained from the base of the skull through the vertex  without intravenous contrast. COMPARISON:  11/13/2015 FINDINGS: Bony calvarium is intact. No gross soft tissue abnormality is seen. Diffuse atrophic changes and chronic white matter ischemic changes are seen. These are stable from the previous exam. No findings to suggest acute hemorrhage, acute infarction or space-occupying mass lesion are noted. IMPRESSION: Chronic atrophic and ischemic changes without acute abnormality. Electronically Signed   By: Inez Catalina M.D.   On: 02/21/2016 12:23    Micro Results     Recent Results (from the past 240 hour(s))  Urine culture     Status: Abnormal   Collection Time: 02/21/16 11:56 AM  Result Value Ref Range Status   Specimen Description URINE, CATHETERIZED  Final   Special Requests NONE  Final   Culture >=100,000 COLONIES/mL ENTEROCOCCUS SPECIES (A)  Final   Report Status 02/23/2016 FINAL  Final   Organism ID, Bacteria ENTEROCOCCUS SPECIES (A)  Final      Susceptibility   Enterococcus species - MIC*    AMPICILLIN <=2 SENSITIVE Sensitive     LEVOFLOXACIN >=8 RESISTANT Resistant     NITROFURANTOIN <=16 SENSITIVE Sensitive     VANCOMYCIN 1 SENSITIVE Sensitive     * >=100,000 COLONIES/mL ENTEROCOCCUS SPECIES  Blood Culture (routine x 2)     Status: None (Preliminary result)   Collection Time: 02/21/16  1:12 PM  Result Value Ref Range Status   Specimen Description BLOOD RIGHT ANTECUBITAL  Final   Special Requests BOTTLES DRAWN AEROBIC AND ANAEROBIC 5 CC EA  Final   Culture   Final    NO GROWTH 2 DAYS Performed at [facility]    Report Status PENDING  Incomplete  Blood Culture (routine x 2)     Status: None (Preliminary result)   Collection Time: 02/21/16  1:13 PM  Result Value Ref Range Status   Specimen Description BLOOD RIGHT HAND  Final   Special Requests BOTTLES DRAWN AEROBIC AND ANAEROBIC 5 CC EA  Final   Culture   Final    NO GROWTH 2 DAYS Performed at [facility]    Report Status PENDING  Incomplete        Today   Subjective:   Barry Wright today has no headache,no chest or abdominal pain, feels much better wants to go home today.   Objective:   Blood pressure 169/79, pulse 73, temperature 97.8 F (36.6 C), temperature source Oral, resp. rate 24, height 5\' 11"  (1.803 m), weight 81.3 kg (179 lb 3.7 oz), SpO2 97 %.   Intake/Output Summary (Last 24 hours) at 02/24/16 1116 Last data filed at 02/24/16 0900  Gross per 24 hour  Intake    880 ml  Output    575 ml  Net    305 ml    Exam Awake Alert, Oriented [place].AT,PERRAL Supple Neck,No JVD,  Symmetrical Chest wall movement, Good air movement bilaterally, CTAB RRR,No Gallops,Rubs or new Murmurs, No Parasternal Heave +ve B.Sounds, Abd Soft, No tenderness, No organomegaly appriciated, No rebound - guarding or rigidity. No Cyanosis, Clubbing , trace pedal edema, No new Rash or bruise  Data Review   CBC w Diff: Lab Results  Component Value Date   WBC 7.6 02/23/2016   HGB 12.6* 02/23/2016  HCT 37.5* 02/23/2016   PLT 191 02/23/2016   LYMPHOPCT 19 02/21/2016   MONOPCT 9 02/21/2016   EOSPCT 1 02/21/2016   BASOPCT 0 02/21/2016    CMP: Lab Results  Component Value Date   NA 144 02/23/2016   K 3.8 02/23/2016   CL 108 02/23/2016   CO2 24 02/23/2016   BUN 21* 02/23/2016   CREATININE 1.39* 02/23/2016   PROT 5.8* 02/21/2016   ALBUMIN 2.8* 02/21/2016   BILITOT 1.0 02/21/2016   ALKPHOS 69 02/21/2016   AST 25 02/21/2016   ALT 12* 02/21/2016  .   Total Time in preparing paper work, data evaluation and todays exam - 35 minutes  Keymiah Lyles M.D on 02/24/2016 at 11:16 AM  [name]   Office  [phone]

## 2016-02-24 NOTE — Discharge Instructions (Signed)
Follow with Primary MD Mathews Argyle, MD in 7 days   Get CBC, CMP,  checked  by Primary MD next visit.    Activity: As tolerated with Full fall precautions use walker/cane & assistance as needed   Disposition Home    Diet: Heart Healthy, with feeding assistance and aspiration precautions.  For Heart failure patients - Check your Weight same time everyday, if you gain over 2 pounds, or you develop in leg swelling, experience more shortness of breath or chest pain, call your Primary MD immediately. Follow Cardiac Low Salt Diet and 1.5 lit/day fluid restriction.   On your next visit with your primary care physician please Get Medicines reviewed and adjusted.   Please request your Prim.MD to go over all Hospital Tests and Procedure/Radiological results at the follow up, please get all Hospital records sent to your Prim MD by signing hospital release before you go home.   If you experience worsening of your admission symptoms, develop shortness of breath, life threatening emergency, suicidal or homicidal thoughts you must seek medical attention immediately by calling 911 or calling your MD immediately  if symptoms less severe.  You Must read complete instructions/literature along with all the possible adverse reactions/side effects for all the Medicines you take and that have been prescribed to you. Take any new Medicines after you have completely understood and accpet all the possible adverse reactions/side effects.   Do not drive, operating heavy machinery, perform activities at heights, swimming or participation in water activities or provide baby sitting services if your were admitted for syncope or siezures until you have seen by Primary MD or a Neurologist and advised to do so again.  Do not drive when taking Pain medications.    Do not take more than prescribed Pain, Sleep and Anxiety Medications  Special Instructions: If you have smoked or chewed Tobacco  in the last 2 yrs  please stop smoking, stop any regular Alcohol  and or any Recreational drug use.  Wear Seat belts while driving.   Please note  You were cared for by a hospitalist during your hospital stay. If you have any questions about your discharge medications or the care you received while you were in the hospital after you are discharged, you can call the unit and asked to speak with the hospitalist on call if the hospitalist that took care of you is not available. Once you are discharged, your primary care physician will handle any further medical issues. Please note that NO REFILLS for any discharge medications will be authorized once you are discharged, as it is imperative that you return to your primary care physician (or establish a relationship with a primary care physician if you do not have one) for your aftercare needs so that they can reassess your need for medications and monitor your lab values.  Urinary Tract Infection Urinary tract infections (UTIs) can develop anywhere along your urinary tract. Your urinary tract is your body's drainage system for removing wastes and extra water. Your urinary tract includes two kidneys, two ureters, a bladder, and a urethra. Your kidneys are a pair of bean-shaped organs. Each kidney is about the size of your fist. They are located below your ribs, one on each side of your spine. CAUSES Infections are caused by microbes, which are microscopic organisms, including fungi, viruses, and bacteria. These organisms are so small that they can only be seen through a microscope. Bacteria are the microbes that most commonly cause UTIs. SYMPTOMS  Symptoms of  UTIs may vary by age and gender of the patient and by the location of the infection. Symptoms in young women typically include a frequent and intense urge to urinate and a painful, burning feeling in the bladder or urethra during urination. Older women and men are more likely to be tired, shaky, and weak and have muscle  aches and abdominal pain. A fever may mean the infection is in your kidneys. Other symptoms of a kidney infection include pain in your back or sides below the ribs, nausea, and vomiting. DIAGNOSIS To diagnose a UTI, your caregiver will ask you about your symptoms. Your caregiver will also ask you to provide a urine sample. The urine sample will be tested for bacteria and white blood cells. White blood cells are made by your body to help fight infection. TREATMENT  Typically, UTIs can be treated with medication. Because most UTIs are caused by a bacterial infection, they usually can be treated with the use of antibiotics. The choice of antibiotic and length of treatment depend on your symptoms and the type of bacteria causing your infection. HOME CARE INSTRUCTIONS  If you were prescribed antibiotics, take them exactly as your caregiver instructs you. Finish the medication even if you feel better after you have only taken some of the medication.  Drink enough water and fluids to keep your urine clear or pale yellow.  Avoid caffeine, tea, and carbonated beverages. They tend to irritate your bladder.  Empty your bladder often. Avoid holding urine for long periods of time.  Empty your bladder before and after sexual intercourse.  After a bowel movement, women should cleanse from front to back. Use each tissue only once. SEEK MEDICAL CARE IF:   You have back pain.  You develop a fever.  Your symptoms do not begin to resolve within 3 days. SEEK IMMEDIATE MEDICAL CARE IF:   You have severe back pain or lower abdominal pain.  You develop chills.  You have nausea or vomiting.  You have continued burning or discomfort with urination. MAKE SURE YOU:   Understand these instructions.  Will watch your condition.  Will get help right away if you are not doing well or get worse.   This information is not intended to replace advice given to you by your health care provider. Make sure you  discuss any questions you have with your health care provider.   Document Released: 08/06/2005 Document Revised: 07/18/2015 Document Reviewed: 12/05/2011 Elsevier Interactive Patient Education [employer].

## 2016-02-25 ENCOUNTER — Other Ambulatory Visit: Payer: Self-pay

## 2016-02-25 ENCOUNTER — Ambulatory Visit: Payer: Medicare Other | Admitting: Physical Therapy

## 2016-02-25 DIAGNOSIS — Z79899 Other long term (current) drug therapy: Secondary | ICD-10-CM

## 2016-02-25 NOTE — Patient Outreach (Signed)
[facility]) Care Management  02/25/2016  Barry Wright 01-11-1920 [id number]  Transition of care call complete- Member lives with wife and has personal care service assistants through [employer]. Member reports [facility] also manages his medications and have been out to correct his medication box today. Member reports no questions or issues at this time  Plan: continue weekly transition of care engagements. Home visit within the next two weeks.  Thea Silversmith, RN, MSN, [place] Coordinator Cell: [phone]

## 2016-02-26 LAB — CULTURE, BLOOD (ROUTINE X 2)
CULTURE: NO GROWTH
CULTURE: NO GROWTH

## 2016-02-28 ENCOUNTER — Encounter: Payer: Medicare Other | Admitting: Physical Therapy

## 2016-03-03 ENCOUNTER — Ambulatory Visit: Payer: Medicare Other

## 2016-03-03 DIAGNOSIS — M6289 Other specified disorders of muscle: Secondary | ICD-10-CM | POA: Diagnosis not present

## 2016-03-03 DIAGNOSIS — R262 Difficulty in walking, not elsewhere classified: Secondary | ICD-10-CM

## 2016-03-03 DIAGNOSIS — M6281 Muscle weakness (generalized): Secondary | ICD-10-CM

## 2016-03-03 NOTE — Therapy (Addendum)
[place], [place], [address] Phone: [phone]   Fax:  [phone]  Physical Therapy Treatment  Patient Details  Name: Barry Wright MRN: [id number] Date of Birth: 01-11-1920 Referring Provider: Lajean Manes, MD  Encounter Date: 03/03/2016      PT End of Session - 03/03/16 1157    Visit Number 24   Number of Visits 32   Date for PT Re-Evaluation 03/28/16   Authorization Type KX modifier   PT Start Time 1103   PT Stop Time 1155   PT Time Calculation (min) 52 min   Equipment Utilized During Treatment Gait belt   Activity Tolerance Patient limited by fatigue   Behavior During Therapy [facility] for tasks assessed/performed      Past Medical History  Diagnosis Date  . Chronic back pain   . Hypercholesteremia   . Hypothyroidism   . CAD (coronary artery disease)   . Chronic kidney disease (CKD), stage III (moderate)   . Polymyalgia rheumatica ([place])   . Left bundle branch block   . Hx of CABG   . History of nephrectomy   . Internal carotid artery stenosis   . Myocardial infarction ([place])   . CHF (congestive heart failure) ([place])   . Stroke [facility])     Past Surgical History  Procedure Laterality Date  . Coronary artery bypass graft    . Cholecystectomy    . Kidney surgery      kidney removed    There were no vitals filed for this visit.      Subjective Assessment - 03/03/16 1104    Subjective I was sick . IT has been a long time.Marland Kitchen He reports in hospital for a week due to UTI.    Patient is accompained by: Family member  spouse   Currently in Pain? No/denies            [facility] PT Assessment - 03/03/16 0001    Strength   Right Hip Flexion 3+/5   Right Hip Extension 2/5   Right Hip Internal Rotation 3-/5   Right Hip ABduction 2/5   Right Hip ADduction 4-/5   Left Hip Flexion 3+/5   Left Hip Internal Rotation 3-/5   Left Hip ABduction 2/5   Left Hip ADduction 4/5   Right Knee Flexion  4/5   Right Knee Extension 3+/5   Left Knee Flexion 4+/5   Left Knee Extension 4-/5   Right Ankle Dorsiflexion 4/5   Right Ankle Eversion 4/5   Left Ankle Dorsiflexion 4/5   Bed Mobility   Bed Mobility Rolling Right;Rolling Left;Right Sidelying to Sit;Sit to Supine   Rolling Right 4: Min assist   Rolling Left 4: Min assist   Right Sidelying to Sit 4: Min assist   Sit to Supine 3: Mod assist   Transfers   Sit to Stand --   Ambulation/Gait   Gait Comments Walked in bars 10 feet and 6  feet with walker.  He sat and rested 5 min and walked  20 feet to mat     With MMT also did 5-8 reps of movement tested                          PT Short Term Goals - 03/03/16 1205    PT SHORT TERM GOAL #1   Title pt will be independent with intial HEP after review   Status Achieved   PT SHORT TERM  GOAL #2   Title He will be able to walk in bars 100 feet or more with turning with only contact guard.   Status Deferred   PT SHORT TERM GOAL #3   Title He will be able to roll on mat RT and LT  without asssitance   Baseline Much verbal cuing and a small amount of tactile cuing but no assist with rolling   Status Partially Met   PT SHORT TERM GOAL #4   Title He will be able to go sit to stand with min assist to walker   Baseline with walker but mod assist off mat   Status Partially Met           PT Long Term Goals - 03/03/16 1206    PT LONG TERM GOAL #1   Title pt will perform sit to stand without assistance at his WC to walker   Baseline need s slight asssit now   Status Partially Met   PT LONG TERM GOAL #2   Title pt will ambulate x 30 ft with RW CGA   Baseline 20 feet today   Status Partially Met   PT LONG TERM GOAL #3   Title pt will tolerate standing activities in the clinic x 5 minutes to demonstrate improved endurance   Baseline 30 sec today   Status On-going   PT LONG TERM GOAL #4   Title patient able to walk 50 feet with rolling walker and c.g with aide from  W/C   Baseline not walking with aides   Status On-going   PT LONG TERM GOAL #5   Title He will be able to transfer  with RW and pivot fully before sitting with min to no verbal cues.   Status On-going   PT LONG TERM GOAL #6   Title He will be able to transfer with pivot correctly to chair after walking 50 feet with walker. after turning to walk back at 50 feet.    Status On-going               Plan - 03/03/16 1158    Clinical Impression Statement Barry Wright returns after hospitalization for UTI. He is reportedly clear of the infection and feels better . He is significantly declined from his best level of walking and function at home prior to onset of illness. He was walking some days at home with caregiver before and this was good ingeneral for his health and ease on caregivers .  I think it may be of benefit to see if he can return to level of function prior to decline  as this will help with caring for Barry Wright and  if he can walk at home  may help his overall wellness   Rehab Potential Fair   Clinical Impairments Affecting Rehab Potential Pt with lumbar stenosis and deconditioning.    PT Frequency 2x / week   PT Duration 4 weeks   PT Treatment/Interventions Gait training;Functional mobility training;Therapeutic exercise;Patient/family education;Passive range of motion   PT Next Visit Plan Continue gait with RW and transfers over remaining visits working on turns and any standing exercise to improve strength and tolerance on feet. We may alos work on mat for strength and stretching.    Consulted and Agree with Plan of Care Patient   Family Member Consulted spouse    G Code; clinical judegement   Status: CM   Goal : CK  Patient will benefit from skilled therapeutic intervention in order to  improve the following deficits and impairments:  Abnormal gait, Difficulty walking, Decreased strength, Decreased range of motion, Decreased activity tolerance  Visit Diagnosis: Muscle weakness  (generalized)  Difficulty in walking, not elsewhere classified     Problem List Patient Active Problem List   Diagnosis Date Noted  . UTI (lower urinary tract infection) 02/21/2016  . AKI (acute kidney injury) ([place]) 02/21/2016  . Pressure ulcer 09/29/2015  . Altered mental status   . Acute encephalopathy 09/26/2015  . Chronic back pain 09/26/2015  . Generalized weakness 04/13/2015  . Lobar pneumonia due to unspecified organism 04/13/2015  . Prolonged QT interval   . Weakness 04/11/2015  . CAP (community acquired pneumonia) 01/25/2015  . Dehydration 01/25/2015  . Chronic diastolic heart failure ([place]) 06/19/2014  . Essential hypertension, benign 03/08/2014  . Hypothyroidism 03/08/2014  . Protein-calorie malnutrition, moderate ([place]) 03/08/2014  . Constipation 03/08/2014  . Physical deconditioning 03/08/2014  . Hypokalemia 02/27/2014  . Abnormal serum protein electrophoresis 01/07/2013  . Chronic kidney disease (CKD), stage III (moderate) 01/03/2013  . Polymyalgia rheumatica ([place]) 01/03/2013  . Osteopenia 01/03/2013  . Carotid artery disease ([place]) 01/03/2013  . Spinal stenosis of lumbar region 01/03/2013  . Coronary artery disease 01/03/2013  . Old MI (myocardial infarction) 01/03/2013  . Transient cerebral ischemia 11/18/2011    [name]  PT  03/03/2016, 12:09 PM  [facility] [address] [place], [place], [address] Phone: [phone]   Fax:  [phone]  Name: Barry Wright MRN: [id number] Date of Birth: 12-21-1919

## 2016-03-04 ENCOUNTER — Other Ambulatory Visit: Payer: Self-pay

## 2016-03-04 NOTE — Patient Outreach (Signed)
[facility]) Care Management  [place]  03/04/2016   ISAY TROCHEZ 1920/05/27 [id number]  Subjective: Member reports feeling better. Denies any problems or issues.  Objective:  BP 118/68 mmHg  Pulse 60  Resp 20  SpO2 94%, lungs with faint end inspiratory crackles which cleared with deep breathing. Bilateral lower extremity edema noted. Member's feet elevated.  Encounter Medications:  Outpatient Encounter Prescriptions as of 03/04/2016  Medication Sig Note  . acetaminophen (TYLENOL) 500 MG tablet Take 1,000 mg by mouth 2 (two) times daily.    Marland Kitchen aspirin 81 MG tablet Take 81 mg by mouth daily.   . calcium carbonate (OS-CAL - DOSED IN MG OF ELEMENTAL CALCIUM) 1250 MG tablet Take 1 tablet by mouth daily with breakfast.   . clopidogrel (PLAVIX) 75 MG tablet Take 75 mg by mouth daily.   04/12/2015: .   Marland Kitchen fexofenadine (ALLEGRA) 180 MG tablet Take 180 mg by mouth daily.   . folic acid (FOLVITE) 1 MG tablet Take 1 mg by mouth daily.   . furosemide (LASIX) 20 MG tablet Take 0.5 tablets (10 mg total) by mouth daily. (Patient taking differently: Take 20 mg by mouth daily. ) 03/04/2016: Per Notation: taking one 20 mg tablet daily.  Marland Kitchen gabapentin (NEURONTIN) 100 MG capsule Take 100 mg by mouth 3 (three) times daily. Per note: Takes 2 capsules three times/day.   . methotrexate (RHEUMATREX) 2.5 MG tablet Take 10 mg by mouth every Wednesday.   . Multiple Vitamins-Minerals (MULTIVITAMINS THER. W/MINERALS) TABS Take 1 tablet by mouth daily.     . nitroGLYCERIN (NITROSTAT) 0.4 MG SL tablet Place 0.4 mg under the tongue every 5 (five) minutes as needed for chest pain.   Marland Kitchen omeprazole (PRILOSEC) 20 MG capsule Take 20 mg by mouth daily.   . polyethylene glycol (MIRALAX / GLYCOLAX) packet Take 17 g by mouth daily as needed for mild constipation.    . predniSONE (DELTASONE) 5 MG tablet Take 5 mg by mouth daily.   Marland Kitchen SYNTHROID 100 MCG tablet Take 100 mcg by mouth daily before breakfast.   09/26/2015: Received from: External Pharmacy  . traMADol (ULTRAM) 50 MG tablet Take one tablet by mouth twice daily for pains PRN (Patient taking differently: Take 50 mg by mouth 2 (two) times daily. )   . levofloxacin (LEVAQUIN) 250 MG tablet Take 1 tablet (250 mg total) by mouth daily. (Patient not taking: Reported on 03/04/2016)    No facility-administered encounter medications on file as of 03/04/2016.    Functional Status:  In your present state of health, do you have any difficulty performing the following activities: 03/04/2016 02/21/2016  Hearing? Tempie Donning  Vision? N N  Difficulty concentrating or making decisions? Tempie Donning  Walking or climbing stairs? Y Y  Dressing or bathing? Y Y  Doing errands, shopping? Tempie Donning  Preparing Food and eating ? Y -  Using the Toilet? Y -  In the past six months, have you accidently leaked urine? N -  Do you have problems with loss of bowel control? N -  Managing your Medications? Y -  Managing your Finances? Y -  Housekeeping or managing your Housekeeping? Y -    Fall/Depression Screening: PHQ 2/9 Scores 03/04/2016  PHQ - 2 Score 0   Fall Risk  03/04/2016 06/27/2015  Falls in the past year? No No  Risk for fall due to : - Impaired balance/gait;Impaired mobility   Assessment: 82 year old with recent admission for urinary tract  infection, acute encephalopathy. Member denies any issues at this time. Member lives at home with his wife. Member reports supportive daughter that manages his finances. Member has personal care service four hours in the morning and four hours in the evening with [place] care service. [employer] young also manages members medications.   Medications reviewed with member.  Member is on furosemide and diagnosis of heart failure is on member chart. Member states he does not remember ever being told that he has heart failure. Member wife mentioned that he has had problems with swelling in his legs. RNCM instructed member on the  heart failure zone tool, although this has not seemed to be an issue for member and encouraged member to follow up with primary care regarding diagnosis at his home visit tomorrow.   Plan: continue weekly engagement for transition of care.  Thea Silversmith, RN, MSN, [place] Coordinator Cell: [phone]

## 2016-03-05 DIAGNOSIS — I129 Hypertensive chronic kidney disease with stage 1 through stage 4 chronic kidney disease, or unspecified chronic kidney disease: Secondary | ICD-10-CM | POA: Diagnosis not present

## 2016-03-05 DIAGNOSIS — R410 Disorientation, unspecified: Secondary | ICD-10-CM | POA: Diagnosis not present

## 2016-03-05 DIAGNOSIS — N3000 Acute cystitis without hematuria: Secondary | ICD-10-CM | POA: Diagnosis not present

## 2016-03-05 DIAGNOSIS — D692 Other nonthrombocytopenic purpura: Secondary | ICD-10-CM | POA: Diagnosis not present

## 2016-03-05 DIAGNOSIS — N183 Chronic kidney disease, stage 3 (moderate): Secondary | ICD-10-CM | POA: Diagnosis not present

## 2016-03-05 DIAGNOSIS — Z1389 Encounter for screening for other disorder: Secondary | ICD-10-CM | POA: Diagnosis not present

## 2016-03-10 ENCOUNTER — Ambulatory Visit: Payer: Medicare Other

## 2016-03-12 ENCOUNTER — Other Ambulatory Visit: Payer: Self-pay

## 2016-03-12 NOTE — Patient Outreach (Signed)
[place] [facility]) Care Management  03/12/2016  MARQEZ BONILLA December 29, 1919 [id number]  82 year old admitted with urinary tract infection, acute encephalopathy. RNCM called for transition of care call. No answer.unable to leave message.   Plan: Continue to call next week for weekly transition of care engagements.  Thea Silversmith, RN, MSN, [place] Coordinator Cell: [phone]

## 2016-03-13 ENCOUNTER — Ambulatory Visit: Payer: Medicare Other

## 2016-03-17 ENCOUNTER — Ambulatory Visit: Payer: Medicare Other | Attending: Geriatric Medicine

## 2016-03-17 DIAGNOSIS — R262 Difficulty in walking, not elsewhere classified: Secondary | ICD-10-CM | POA: Insufficient documentation

## 2016-03-17 DIAGNOSIS — M6281 Muscle weakness (generalized): Secondary | ICD-10-CM | POA: Diagnosis not present

## 2016-03-17 NOTE — Therapy (Signed)
[place], [place], [address] Phone: [phone]   Fax:  [phone]  Physical Therapy Treatment  Patient Details  Name: Barry Wright MRN: [id number] Date of Birth: 23-Mar-1920 Referring Provider: Lajean Manes, MD  Encounter Date: 03/17/2016      PT End of Session - 03/17/16 1110    Visit Number 25   Number of Visits 32   PT Start Time 1112   PT Stop Time 9675   PT Time Calculation (min) 33 min   Equipment Utilized During Treatment Gait belt   Activity Tolerance Patient limited by fatigue   Behavior During Therapy [facility] for tasks assessed/performed      Past Medical History  Diagnosis Date  . Chronic back pain   . Hypercholesteremia   . Hypothyroidism   . CAD (coronary artery disease)   . Chronic kidney disease (CKD), stage III (moderate)   . Polymyalgia rheumatica ([place])   . Left bundle branch block   . Hx of CABG   . History of nephrectomy   . Internal carotid artery stenosis   . Myocardial infarction ([place])   . CHF (congestive heart failure) ([place])   . Stroke [facility])     Past Surgical History  Procedure Laterality Date  . Coronary artery bypass graft    . Cholecystectomy    . Kidney surgery      kidney removed    There were no vitals filed for this visit.      Subjective Assessment - 03/17/16 1117    Subjective I've been sick. Vomiting. Don't know why   Patient is accompained by: Family member   Currently in Pain? No/denies                         [facility] Adult PT Treatment/Exercise - 03/17/16 0001    Ambulation/Gait   Pre-Gait Activities We stood 2 sets of 10 reps with min time 8 sec and max time 20 sec. He was not able to step in place or walk any steps in bars.    Lumbar Exercises: Seated   Long Arc Quad on Chair Right;Left;15 reps   LAQ on Chair Weights (lbs) 2   LAQ on Chair Limitations we worked on LE flexion 9 includign hip flexion)and extension , ball  squeezed for adduction of hips and  clam for abduction blue band both with cues to max effort. He needed rest at rep 8-10                PT Education - 03/17/16 1146    Education provided Yes   Education Details Asked pt and spouse to , with aide, stand more at home even for short times and to do the HEP issued previously   Person(s) Educated Patient;Spouse   Methods Explanation   Comprehension Verbalized understanding          PT Short Term Goals - 03/03/16 1205    PT SHORT TERM GOAL #1   Title pt will be independent with intial HEP after review   Status Achieved   PT SHORT TERM GOAL #2   Title He will be able to walk in bars 100 feet or more with turning with only contact guard.   Status Deferred   PT SHORT TERM GOAL #3   Title He will be able to roll on mat RT and LT  without asssitance   Baseline Much verbal cuing and a small amount of tactile  cuing but no assist with rolling   Status Partially Met   PT SHORT TERM GOAL #4   Title He will be able to go sit to stand with min assist to walker   Baseline with walker but mod assist off mat   Status Partially Met           PT Long Term Goals - 03/03/16 1206    PT LONG TERM GOAL #1   Title pt will perform sit to stand without assistance at his WC to walker   Baseline need s slight asssit now   Status Partially Met   PT LONG TERM GOAL #2   Title pt will ambulate x 30 ft with RW CGA   Baseline 20 feet today   Status Partially Met   PT LONG TERM GOAL #3   Title pt will tolerate standing activities in the clinic x 5 minutes to demonstrate improved endurance   Baseline 30 sec today   Status On-going   PT LONG TERM GOAL #4   Title patient able to walk 50 feet with rolling walker and c.g with aide from W/C   Baseline not walking with aides   Status On-going   PT LONG TERM GOAL #5   Title He will be able to transfer  with RW and pivot fully before sitting with min to no verbal cues.   Status On-going   PT LONG  TERM GOAL #6   Title He will be able to transfer with pivot correctly to chair after walking 50 feet with walker. after turning to walk back at 50 feet.    Status On-going               Plan - 03/17/16 1146    Clinical Impression Statement [name] reports illness with stomach virus  and now is more limited with activity tolerance not able to stand for more than 20 sec and unable to walk at all.  He is significantly worse than prior to hospitalization. This is not encouraging.  We will continue per plan (may extend as the plane was 8 visits) and if progressiing possible contniuation but we need to be honest as to progess.    PT Home Exercise Plan Cont gait and exercise , Try  standing tolerance, work on turning  to sit to LT and to RT. Address goals   Consulted and Agree with Plan of Care Patient;Family member/caregiver   Family Member Consulted spouse      Patient will benefit from skilled therapeutic intervention in order to improve the following deficits and impairments:  Abnormal gait, Difficulty walking, Decreased strength, Decreased range of motion, Decreased activity tolerance  Visit Diagnosis: Muscle weakness (generalized)  Difficulty in walking, not elsewhere classified     Problem List Patient Active Problem List   Diagnosis Date Noted  . UTI (lower urinary tract infection) 02/21/2016  . AKI (acute kidney injury) ([place]) 02/21/2016  . Pressure ulcer 09/29/2015  . Altered mental status   . Acute encephalopathy 09/26/2015  . Chronic back pain 09/26/2015  . Generalized weakness 04/13/2015  . Lobar pneumonia due to unspecified organism 04/13/2015  . Prolonged QT interval   . Weakness 04/11/2015  . CAP (community acquired pneumonia) 01/25/2015  . Dehydration 01/25/2015  . Chronic diastolic heart failure ([place]) 06/19/2014  . Essential hypertension, benign 03/08/2014  . Hypothyroidism 03/08/2014  . Protein-calorie malnutrition, moderate ([place]) 03/08/2014  .  Constipation 03/08/2014  . Physical deconditioning 03/08/2014  . Hypokalemia 02/27/2014  . Abnormal  serum protein electrophoresis 01/07/2013  . Chronic kidney disease (CKD), stage III (moderate) 01/03/2013  . Polymyalgia rheumatica ([place]) 01/03/2013  . Osteopenia 01/03/2013  . Carotid artery disease ([place]) 01/03/2013  . Spinal stenosis of lumbar region 01/03/2013  . Coronary artery disease 01/03/2013  . Old MI (myocardial infarction) 01/03/2013  . Transient cerebral ischemia 11/18/2011    [name]  PT 03/17/2016, 11:52 AM  [facility] [address] [place], [place], [address] Phone: [phone]   Fax:  [phone]  Name: Barry Wright MRN: [id number] Date of Birth: 02-Nov-1920

## 2016-03-18 ENCOUNTER — Other Ambulatory Visit: Payer: Self-pay | Admitting: Pharmacist

## 2016-03-18 NOTE — Patient Outreach (Signed)
[facility]) Care Management  03/18/2016  [name] 27-Feb-1920 [id number]   Barry Wright is a 82yo who was referred to [place] from [place] for medication reconciliation.  Phone call to patient to review medications with patient and/or his caregiver.  There was no answer.  I left a HIPAA compliant voicemail for patient to return my phone call.  I will make a second outreach call within one week if patient does not return my phone call.    Elisabeth Most, Pharm.D. Pharmacy Resident [place] [phone]

## 2016-03-20 ENCOUNTER — Ambulatory Visit: Payer: Medicare Other

## 2016-03-24 ENCOUNTER — Ambulatory Visit: Payer: Medicare Other

## 2016-03-24 ENCOUNTER — Other Ambulatory Visit: Payer: Self-pay | Admitting: Pharmacist

## 2016-03-24 ENCOUNTER — Other Ambulatory Visit: Payer: Self-pay

## 2016-03-24 DIAGNOSIS — M6281 Muscle weakness (generalized): Secondary | ICD-10-CM | POA: Diagnosis not present

## 2016-03-24 DIAGNOSIS — R262 Difficulty in walking, not elsewhere classified: Secondary | ICD-10-CM

## 2016-03-24 NOTE — Therapy (Signed)
[place], [place], [address] Phone: [phone]   Fax:  [phone]  Physical Therapy Treatment  Patient Details  Name: Barry Wright MRN: [id number] Date of Birth: 1920-01-17 Referring Provider: Lajean Manes, MD  Encounter Date: 03/24/2016      PT End of Session - 03/24/16 1100    Visit Number 26   Number of Visits 32   Date for PT Re-Evaluation 03/28/16   Authorization Type KX modifier   PT Start Time 1100   PT Stop Time 1145   PT Time Calculation (min) 45 min   Equipment Utilized During Treatment Gait belt   Activity Tolerance Patient limited by fatigue   Behavior During Therapy Barry Wright for tasks assessed/performed      Past Medical History  Diagnosis Date  . Chronic back pain   . Hypercholesteremia   . Hypothyroidism   . CAD (coronary artery disease)   . Chronic kidney disease (CKD), stage III (moderate)   . Polymyalgia rheumatica ([place])   . Left bundle branch block   . Hx of CABG   . History of nephrectomy   . Internal carotid artery stenosis   . Myocardial infarction ([place])   . CHF (congestive heart failure) ([place])   . Stroke [facility])     Past Surgical History  Procedure Laterality Date  . Coronary artery bypass graft    . Cholecystectomy    . Kidney surgery      kidney removed    There were no vitals filed for this visit.                       [facility] Adult PT Treatment/Exercise - 03/24/16 0001    Ambulation/Gait   Pre-Gait Activities stood x 1 60 sec. then rested and walked 3x 12 feet in bars with multiple cues for posture and keeping legs straight.   Better than last visit.    Lumbar Exercises: Supine   Bridge 10 reps;2 seconds   Bridge Limitations 2 sets   Other Supine Lumbar Exercises marching 10 reps RT and LT 2 sets   Other Supine Lumbar Exercises feet 15 inches apart and leg /hip and lower trunk rotation RT  and Lt  2x10   also worked on rolling Rt and LT  and to go supine to sit with max assist today. He had some LBP due to prolonged extension of back. Worked on rolling . Stiffness of spine an dhips limits pt options                  PT Short Term Goals - 03/24/16 1052    PT SHORT TERM GOAL #1   Status --   PT SHORT TERM GOAL #3   Title He will be able to roll on mat RT and LT  without asssitance   Status On-going   PT SHORT TERM GOAL #4   Title He will be able to go sit to stand with min assist to walker   Status Achieved           PT Long Term Goals - 03/03/16 1206    PT LONG TERM GOAL #1   Title pt will perform sit to stand without assistance at his WC to walker   Baseline need s slight asssit now   Status Partially Met   PT LONG TERM GOAL #2   Title pt will ambulate x 30 ft with RW CGA   Baseline 20 feet  today   Status Partially Met   PT LONG TERM GOAL #3   Title pt will tolerate standing activities in the clinic x 5 minutes to demonstrate improved endurance   Baseline 30 sec today   Status On-going   PT LONG TERM GOAL #4   Title patient able to walk 50 feet with rolling walker and c.g with aide from W/C   Baseline not walking with aides   Status On-going   PT LONG TERM GOAL #5   Title He will be able to transfer  with RW and pivot fully before sitting with min to no verbal cues.   Status On-going   PT LONG TERM GOAL #6   Title He will be able to transfer with pivot correctly to chair after walking 50 feet with walker. after turning to walk back at 50 feet.    Status On-going               Plan - 03/24/16 1101    Clinical Impression Statement [name] did better today walking some distance in bars and  and cooperating with all exercises. As he is making some gains continue PT . Encouraged to stand more often during the day as heving help allows   PT Home Exercise Plan Cont gait and exercise , Try  standing tolerance, work on turning  to sit to LT and to RT. Address goals   Consulted and Agree with  Plan of Care Patient;Family member/caregiver   Family Member Consulted spouse      Patient will benefit from skilled therapeutic intervention in order to improve the following deficits and impairments:  Abnormal gait, Difficulty walking, Decreased strength, Decreased range of motion, Decreased activity tolerance  Visit Diagnosis: Muscle weakness (generalized)  Difficulty in walking, not elsewhere classified     Problem List Patient Active Problem List   Diagnosis Date Noted  . UTI (lower urinary tract infection) 02/21/2016  . AKI (acute kidney injury) ([place]) 02/21/2016  . Pressure ulcer 09/29/2015  . Altered mental status   . Acute encephalopathy 09/26/2015  . Chronic back pain 09/26/2015  . Generalized weakness 04/13/2015  . Lobar pneumonia due to unspecified organism 04/13/2015  . Prolonged QT interval   . Weakness 04/11/2015  . CAP (community acquired pneumonia) 01/25/2015  . Dehydration 01/25/2015  . Chronic diastolic heart failure ([place]) 06/19/2014  . Essential hypertension, benign 03/08/2014  . Hypothyroidism 03/08/2014  . Protein-calorie malnutrition, moderate ([place]) 03/08/2014  . Constipation 03/08/2014  . Physical deconditioning 03/08/2014  . Hypokalemia 02/27/2014  . Abnormal serum protein electrophoresis 01/07/2013  . Chronic kidney disease (CKD), stage III (moderate) 01/03/2013  . Polymyalgia rheumatica ([place]) 01/03/2013  . Osteopenia 01/03/2013  . Carotid artery disease ([place]) 01/03/2013  . Spinal stenosis of lumbar region 01/03/2013  . Coronary artery disease 01/03/2013  . Old MI (myocardial infarction) 01/03/2013  . Transient cerebral ischemia 11/18/2011    [name]  PT 03/24/2016, 11:48 AM  [facility] [address] [place], [place], [address] Phone: [phone]   Fax:  [phone]  Name: Barry Wright MRN: [id number] Date of Birth: October 13, 1920

## 2016-03-24 NOTE — Patient Outreach (Signed)
[place] [facility]) Care Management  03/24/2016  Barry Wright Dec 26, 1919 [id number]  RNCM called for transition of care. Member denies any issues or questions at this time. Member reports he continues to have services with "[facility]" for medication pill box fill/managment and personal care services.  Plan: RNCM will follow up one more week telephonically to see if member has any additional care management needs.  Thea Silversmith, RN, MSN, [place] Coordinator Cell: [phone]

## 2016-03-24 NOTE — Patient Outreach (Signed)
[facility] [facility]) Care Management  03/24/2016  STODDARD TOWNE 12/22/1919 [id number]   Barry Wright is a 82 yo who was referred to [facility] from [place] for medication reconciliation.  Phone call to patient to review medications with patient and/or his caregiver.  There was no answer, and no option to leave a voicemail.  I will make a third outreach call within one week if patient does not return my phone call.    Elisabeth Most, Pharm.D. Pharmacy Resident [place] [phone]

## 2016-03-27 ENCOUNTER — Ambulatory Visit: Payer: Medicare Other

## 2016-03-27 DIAGNOSIS — R262 Difficulty in walking, not elsewhere classified: Secondary | ICD-10-CM

## 2016-03-27 DIAGNOSIS — M6281 Muscle weakness (generalized): Secondary | ICD-10-CM

## 2016-03-27 NOTE — Therapy (Signed)
[place], [place], [address] Phone: [phone]   Fax:  [phone]  Physical Therapy Treatment  Patient Details  Name: Barry Wright MRN: [id number] Date of Birth: [birth year] Referring Provider: Lajean Manes, MD  Encounter Date: 03/27/2016      PT End of Session - 03/27/16 1458    Visit Number 27   Number of Visits 32   Date for PT Re-Evaluation 04/18/16   PT Start Time 0215   PT Stop Time 0255   PT Time Calculation (min) 40 min   Activity Tolerance Patient limited by fatigue   Behavior During Therapy [facility] for tasks assessed/performed      Past Medical History  Diagnosis Date  . Chronic back pain   . Hypercholesteremia   . Hypothyroidism   . CAD (coronary artery disease)   . Chronic kidney disease (CKD), stage III (moderate)   . Polymyalgia rheumatica ([place])   . Left bundle branch block   . Hx of CABG   . History of nephrectomy   . Internal carotid artery stenosis   . Myocardial infarction ([place])   . CHF (congestive heart failure) ([place])   . Stroke [facility])     Past Surgical History  Procedure Laterality Date  . Coronary artery bypass graft    . Cholecystectomy    . Kidney surgery      kidney removed    There were no vitals filed for this visit.      Subjective Assessment - 03/27/16 1500    Subjective Feel pretty good . Hvavve tried to stand at home . Wife sick today   Patient is accompained by: --  caregiver   Currently in Pain? No/denies                         [facility] Adult PT Treatment/Exercise - 03/27/16 1430    Ambulation/Gait   Pre-Gait Activities Stood 60 sec prior to wlaking in bars 10 feet , then 12 feet x2 HR 112 bpm max.   Then stood at walker x 2 30 sec , then attempted to walk x 4 with second attempt he walked 2 short steps each foot. He needed moderate assist to stand at walker and to transition from [facility] to walker.                 PT  Education - 03/27/16 1457    Education provided Yes   Education Details Medical issues that affect his abiliity to stand and walk better .    Person(s) Educated Patient   Methods Explanation   Comprehension Verbalized understanding          PT Short Term Goals - 03/24/16 1052    PT SHORT TERM GOAL #1   Status --   PT SHORT TERM GOAL #3   Title He will be able to roll on mat RT and LT  without asssitance   Status On-going   PT SHORT TERM GOAL #4   Title He will be able to go sit to stand with min assist to walker   Status Achieved           PT Long Term Goals - 03/03/16 1206    PT LONG TERM GOAL #1   Title pt will perform sit to stand without assistance at his WC to walker   Baseline need s slight asssit now   Status Partially Met   PT LONG TERM GOAL #2  Title pt will ambulate x 30 ft with RW CGA   Baseline 20 feet today   Status Partially Met   PT LONG TERM GOAL #3   Title pt will tolerate standing activities in the clinic x 5 minutes to demonstrate improved endurance   Baseline 30 sec today   Status On-going   PT LONG TERM GOAL #4   Title patient able to walk 50 feet with rolling walker and c.g with aide from W/C   Baseline not walking with aides   Status On-going   PT LONG TERM GOAL #5   Title He will be able to transfer  with RW and pivot fully before sitting with min to no verbal cues.   Status On-going   PT LONG TERM GOAL #6   Title He will be able to transfer with pivot correctly to chair after walking 50 feet with walker. after turning to walk back at 50 feet.    Status On-going               Plan - 03/27/16 1458    Clinical Impression Statement [name] did better today but still extremely limited compared to his est earlier in episode of care. Will continue 6 moe visits and discharge if STG not met again   PT Next Visit Plan Cointinue to ty to wlak with RW and return to mat for exercise.    Consulted and Agree with Plan of Care Patient       Patient will benefit from skilled therapeutic intervention in order to improve the following deficits and impairments:  Abnormal gait, Difficulty walking, Decreased strength, Decreased range of motion, Decreased activity tolerance  Visit Diagnosis: Muscle weakness (generalized)  Difficulty in walking, not elsewhere classified     Problem List Patient Active Problem List   Diagnosis Date Noted  . UTI (lower urinary tract infection) 02/21/2016  . AKI (acute kidney injury) ([place]) 02/21/2016  . Pressure ulcer 09/29/2015  . Altered mental status   . Acute encephalopathy 09/26/2015  . Chronic back pain 09/26/2015  . Generalized weakness 04/13/2015  . Lobar pneumonia due to unspecified organism 04/13/2015  . Prolonged QT interval   . Weakness 04/11/2015  . CAP (community acquired pneumonia) 01/25/2015  . Dehydration 01/25/2015  . Chronic diastolic heart failure ([place]) 06/19/2014  . Essential hypertension, benign 03/08/2014  . Hypothyroidism 03/08/2014  . Protein-calorie malnutrition, moderate ([place]) 03/08/2014  . Constipation 03/08/2014  . Physical deconditioning 03/08/2014  . Hypokalemia 02/27/2014  . Abnormal serum protein electrophoresis 01/07/2013  . Chronic kidney disease (CKD), stage III (moderate) 01/03/2013  . Polymyalgia rheumatica ([place]) 01/03/2013  . Osteopenia 01/03/2013  . Carotid artery disease ([place]) 01/03/2013  . Spinal stenosis of lumbar region 01/03/2013  . Coronary artery disease 01/03/2013  . Old MI (myocardial infarction) 01/03/2013  . Transient cerebral ischemia 11/18/2011    [name]  PT 03/27/2016, 3:02 PM  [facility] [address] [place], [place], [address] Phone: [phone]   Fax:  [phone]  Name: Barry Wright MRN: [id number] Date of Birth: 1920-10-25

## 2016-03-31 ENCOUNTER — Other Ambulatory Visit: Payer: Self-pay | Admitting: Pharmacist

## 2016-03-31 ENCOUNTER — Other Ambulatory Visit: Payer: Self-pay

## 2016-03-31 ENCOUNTER — Encounter: Payer: Self-pay | Admitting: Pharmacist

## 2016-03-31 NOTE — Patient Outreach (Signed)
[place] [facility]) Care Management  03/31/2016  [name] Mar 19, 1920 [id number]   Barry Wright is a 82 yo who was referred to [facility] for medication reconciliation.  Phone call to patient to review medications with patient and/or his caregiver.  There was no answer, and no option to leave a voicemail.  I will send an unsuccessful outreach letter per protocol as I have made three outreach attempts to patient without success.     Elisabeth Most, Pharm.D. Pharmacy Resident [place] [phone]

## 2016-03-31 NOTE — Patient Outreach (Addendum)
[place] [facility]) Care Management  03/31/2016  DENNIS SIGNS 31-Aug-1920 [id number]  Assessment: 82 year old with admission 4/13-4/16 with urinary tract infection with hematuria/acute encephalopathy. RNCM has been following member for transition of care. Transition of care period ended. RNCM called to follow up to address if there are any additional care management needs. No answer. Unable to leave message.  Plan: await return call and RNCM will follow up within the week if no return call.  Thea Silversmith, RN, MSN, [place] Coordinator Cell: [phone]

## 2016-04-08 ENCOUNTER — Other Ambulatory Visit: Payer: Self-pay

## 2016-04-08 NOTE — Patient Outreach (Signed)
[facility] [facility]) Care Management  04/08/2016  AMPARO COSTELLA 05/25/1920 [id number]   Follow up transition of care call. Member reports there is no change. Denies any signs/symptoms of urinary tract infection. Continues to have [employer] personal care service and medicines continue to be managed by [employer]. Denies any questions or concerns. RNCM discussed case closure. Member is agreeable.  Reinforced 24 hour nurse advice line, Reinforced with member he can call RNCM if condition changes or as needed.  Plan: close case.  Thea Silversmith, RN, MSN, [place] Coordinator Cell: [phone]

## 2016-04-14 ENCOUNTER — Ambulatory Visit: Payer: Medicare Other | Attending: Geriatric Medicine

## 2016-04-14 DIAGNOSIS — M6281 Muscle weakness (generalized): Secondary | ICD-10-CM | POA: Insufficient documentation

## 2016-04-14 DIAGNOSIS — R262 Difficulty in walking, not elsewhere classified: Secondary | ICD-10-CM | POA: Diagnosis not present

## 2016-04-14 NOTE — Therapy (Signed)
[place], [place], [address] Phone: [phone]   Fax:  [phone]  Physical Therapy Treatment  Patient Details  Name: Barry Wright MRN: [id number] Date of Birth: Jun 05, 1920 Referring Provider: Lajean Manes, MD  Encounter Date: 04/14/2016      PT End of Session - 04/14/16 1103    Visit Number 28   Number of Visits 34   Date for PT Re-Evaluation 05/09/16   Authorization Type KX modifier   PT Start Time 1100   PT Stop Time 1145   PT Time Calculation (min) 45 min   Equipment Utilized During Treatment Gait belt   Activity Tolerance Patient limited by fatigue   Behavior During Therapy Barry Wright for tasks assessed/performed      Past Medical History  Diagnosis Date  . Chronic back pain   . Hypercholesteremia   . Hypothyroidism   . CAD (coronary artery disease)   . Chronic kidney disease (CKD), stage III (moderate)   . Polymyalgia rheumatica ([place])   . Left bundle branch block   . Hx of CABG   . History of nephrectomy   . Internal carotid artery stenosis   . Myocardial infarction ([place])   . CHF (congestive heart failure) ([place])   . Stroke Barry Wright)     Past Surgical History  Procedure Laterality Date  . Coronary artery bypass graft    . Cholecystectomy    . Kidney surgery      kidney removed    There were no vitals filed for this visit.      Subjective Assessment - 04/14/16 1109    Subjective He reports he is feeling better. He did not have appointments so has not been here   Currently in Pain? No/denies                         Barry Wright Adult PT Treatment/Exercise - 04/14/16 0001    Ambulation/Gait   Pre-Gait Activities stood at bars and at walker prior to walking.    Gait Comments Walked in bars 12 feet with CG and after with walker  with CG/min assist  15  feet then 10 feet ot mat with min to mod asssist and verbal cues.    Lumbar Exercises: Seated   Long Arc Quad on Chair  Right;Left;20 reps   LAQ on Chair Weights (lbs) 3   Other Seated Lumbar Exercises hip flexion to abduction asisted with verbal and ta ctile assist. He needed much verbal cuing for max effort                  PT Short Term Goals - 04/14/16 1110    PT SHORT TERM GOAL #1   Title pt will be independent with intial HEP after review   Status Achieved   PT SHORT TERM GOAL #2   Title He will be able to walk in bars 100 feet or more with turning with only contact guard.   Status Deferred   PT SHORT TERM GOAL #3   Title He will be able to roll on mat RT and LT  without asssitance   Status On-going   PT SHORT TERM GOAL #4   Title He will be able to go sit to stand with min assist to walker   Status Achieved           PT Long Term Goals - 04/14/16 1110    PT LONG TERM GOAL #1  Title pt will perform sit to stand without assistance at his WC to walker   Status On-going   PT LONG TERM GOAL #2   Title pt will ambulate x 30 ft with RW CGA   Status Partially Met   PT LONG TERM GOAL #3   Title pt will tolerate standing activities in the clinic x 5 minutes to demonstrate improved endurance   Status On-going   PT LONG TERM GOAL #4   Title patient able to walk 50 feet with rolling walker and c.g with aide from W/C   Status On-going   PT LONG TERM GOAL #5   Title He will be able to transfer  with RW and pivot fully before sitting with min to no verbal cues.   Status On-going   PT LONG TERM GOAL #6   Title He will be able to transfer with pivot correctly to chair after walking 50 feet with walker. after turning to walk back at 50 feet.    Status On-going               Plan - 04/14/16 1109    Clinical Impression Statement Barry Wright actually did bette today able to walk farther though still limited. Extended him to end of months and will see ahow he does in next 4 vissits   PT Next Visit Plan Cointinue to ty to walk with RW and return to mat for exercise  Mat exercise next  visit.    Consulted and Agree with Plan of Care Patient   Family Member Consulted spouse      Patient will benefit from skilled therapeutic intervention in order to improve the following deficits and impairments:  Abnormal gait, Difficulty walking, Decreased strength, Decreased range of motion, Decreased activity tolerance  Visit Diagnosis: Muscle weakness (generalized)  Difficulty in walking, not elsewhere classified     Problem List Patient Active Problem List   Diagnosis Date Noted  . UTI (lower urinary tract infection) 02/21/2016  . AKI (acute kidney injury) ([place]) 02/21/2016  . Pressure ulcer 09/29/2015  . Altered mental status   . Acute encephalopathy 09/26/2015  . Chronic back pain 09/26/2015  . Generalized weakness 04/13/2015  . Lobar pneumonia due to unspecified organism 04/13/2015  . Prolonged QT interval   . Weakness 04/11/2015  . CAP (community acquired pneumonia) 01/25/2015  . Dehydration 01/25/2015  . Chronic diastolic heart failure ([place]) 06/19/2014  . Essential hypertension, benign 03/08/2014  . Hypothyroidism 03/08/2014  . Protein-calorie malnutrition, moderate ([place]) 03/08/2014  . Constipation 03/08/2014  . Physical deconditioning 03/08/2014  . Hypokalemia 02/27/2014  . Abnormal serum protein electrophoresis 01/07/2013  . Chronic kidney disease (CKD), stage III (moderate) 01/03/2013  . Polymyalgia rheumatica ([place]) 01/03/2013  . Osteopenia 01/03/2013  . Carotid artery disease ([place]) 01/03/2013  . Spinal stenosis of lumbar region 01/03/2013  . Coronary artery disease 01/03/2013  . Old MI (myocardial infarction) 01/03/2013  . Transient cerebral ischemia 11/18/2011    Barry Wright  PT 04/14/2016, 11:56 AM  [facility] [address] [place], [place], [address] Phone: [phone]   Fax:  [phone]  Name: Barry Wright MRN: [id number] Date of Birth: [birth year]

## 2016-04-21 ENCOUNTER — Ambulatory Visit: Payer: Medicare Other

## 2016-04-21 DIAGNOSIS — R262 Difficulty in walking, not elsewhere classified: Secondary | ICD-10-CM | POA: Diagnosis not present

## 2016-04-21 DIAGNOSIS — M6281 Muscle weakness (generalized): Secondary | ICD-10-CM

## 2016-04-21 NOTE — Therapy (Signed)
[place], [place], [address] Phone: [phone]   Fax:  [phone]  Physical Therapy Treatment  Patient Details  Name: Barry Wright MRN: [id number] Date of Birth: [birth year] Referring Provider: Lajean Manes, MD  Encounter Date: 04/21/2016      PT End of Session - 04/21/16 1100    Visit Number 29   Number of Visits 34   Date for PT Re-Evaluation 05/09/16   Authorization Type KX modifier   PT Start Time 1100   PT Stop Time 1145   PT Time Calculation (min) 45 min   Activity Tolerance Patient tolerated treatment well;Patient limited by fatigue   Behavior During Therapy [facility] for tasks assessed/performed      Past Medical History  Diagnosis Date  . Chronic back pain   . Hypercholesteremia   . Hypothyroidism   . CAD (coronary artery disease)   . Chronic kidney disease (CKD), stage III (moderate)   . Polymyalgia rheumatica ([place])   . Left bundle branch block   . Hx of CABG   . History of nephrectomy   . Internal carotid artery stenosis   . Myocardial infarction ([place])   . CHF (congestive heart failure) ([place])   . Stroke [facility])     Past Surgical History  Procedure Laterality Date  . Coronary artery bypass graft    . Cholecystectomy    . Kidney surgery      kidney removed    There were no vitals filed for this visit.      Subjective Assessment - 04/21/16 1216    Subjective Feel pretty good today.    Currently in Pain? No/denies                         [facility] Adult PT Treatment/Exercise - 04/21/16 0001    Ambulation/Gait   Gait Comments 12 feet in bars the 32 feet with RW with only CG to min asssists with turning . Verbal cues.    Lumbar Exercises: Seated   Long Arc Quad on Chair Right;Left;20 reps   LAQ on Chair Weights (lbs) 4   Other Seated Lumbar Exercises hip flexion to abduction asssited with verbal and ta ctile assist.    Other Seated Lumbar Exercises hip flexion  with knee flexion after extending leg    Lumbar Exercises: Supine   Other Supine Lumbar Exercises Flexion and extension x 15 RT and Lt leg ,   tried to shift hip RT and LT with diffuiculty x 10 , worked on rolling to LT and sitting but needed verbal cuing tactile cue and mod asssist to bring upper body erect. He reported dizziness that cleared afte 60 sec of sitting.    Knee/Hip Exercises: Seated   Long Arc Quad Left;Right;20 reps   Long Arc Quad Weight 4 lbs.  verbal cue to max effort   Marching Right;Left;15 reps   Marching Limitations verbal and tactile cues to max effort   Marching Weights 4 lbs.   Abduction/Adduction  Right;Left;20 reps   Abd/Adduction Limitations 4 pounds  with verbal and tactile cues                  PT Short Term Goals - 04/14/16 1110    PT SHORT TERM GOAL #1   Title pt will be independent with intial HEP after review   Status Achieved   PT SHORT TERM GOAL #2   Title He will be able to walk  in bars 100 feet or more with turning with only contact guard.   Status Deferred   PT SHORT TERM GOAL #3   Title He will be able to roll on mat RT and LT  without asssitance   Status On-going   PT SHORT TERM GOAL #4   Title He will be able to go sit to stand with min assist to walker   Status Achieved           PT Long Term Goals - 04/14/16 1110    PT LONG TERM GOAL #1   Title pt will perform sit to stand without assistance at his WC to walker   Status On-going   PT LONG TERM GOAL #2   Title pt will ambulate x 30 ft with RW CGA   Status Partially Met   PT LONG TERM GOAL #3   Title pt will tolerate standing activities in the clinic x 5 minutes to demonstrate improved endurance   Status On-going   PT LONG TERM GOAL #4   Title patient able to walk 50 feet with rolling walker and c.g with aide from W/C   Status On-going   PT LONG TERM GOAL #5   Title He will be able to transfer  with RW and pivot fully before sitting with min to no verbal cues.    Status On-going   PT LONG TERM GOAL #6   Title He will be able to transfer with pivot correctly to chair after walking 50 feet with walker. after turning to walk back at 50 feet.    Status On-going               Plan - 04/21/16 1213    Clinical Impression Statement Walked 32 feet without exess effort and no stopping . Did well with exercise.    PT Next Visit Plan Cointinue to ty to walk with RW and return to mat for exercise  Mat exercise next visit. Push walking distance if doing well    FOTO next visit   PT Home Exercise Plan Cont gait and exercise , Try  standing tolerance, work on turning  to sit to LT and to RT.   Consulted and Agree with Plan of Care Patient   Family Member Consulted spouse      Patient will benefit from skilled therapeutic intervention in order to improve the following deficits and impairments:  Abnormal gait, Difficulty walking, Decreased strength, Decreased range of motion, Decreased activity tolerance  Visit Diagnosis: Muscle weakness (generalized)  Difficulty in walking, not elsewhere classified     Problem List Patient Active Problem List   Diagnosis Date Noted  . UTI (lower urinary tract infection) 02/21/2016  . AKI (acute kidney injury) ([place]) 02/21/2016  . Pressure ulcer 09/29/2015  . Altered mental status   . Acute encephalopathy 09/26/2015  . Chronic back pain 09/26/2015  . Generalized weakness 04/13/2015  . Lobar pneumonia due to unspecified organism 04/13/2015  . Prolonged QT interval   . Weakness 04/11/2015  . CAP (community acquired pneumonia) 01/25/2015  . Dehydration 01/25/2015  . Chronic diastolic heart failure ([place]) 06/19/2014  . Essential hypertension, benign 03/08/2014  . Hypothyroidism 03/08/2014  . Protein-calorie malnutrition, moderate ([place]) 03/08/2014  . Constipation 03/08/2014  . Physical deconditioning 03/08/2014  . Hypokalemia 02/27/2014  . Abnormal serum protein electrophoresis 01/07/2013  . Chronic kidney  disease (CKD), stage III (moderate) 01/03/2013  . Polymyalgia rheumatica ([place]) 01/03/2013  . Osteopenia 01/03/2013  . Carotid artery disease ([place]) 01/03/2013  .  Spinal stenosis of lumbar region 01/03/2013  . Coronary artery disease 01/03/2013  . Old MI (myocardial infarction) 01/03/2013  . Transient cerebral ischemia 11/18/2011    [name]  PT 04/21/2016, 12:19 PM  [facility] [address] [place], [place], [address] Phone: [phone]   Fax:  [phone]  Name: Barry Wright MRN: [id number] Date of Birth: [birth year]

## 2016-04-22 ENCOUNTER — Other Ambulatory Visit: Payer: Self-pay | Admitting: Pharmacist

## 2016-04-22 NOTE — Patient Outreach (Signed)
[facility]) Care Management  [place]   04/22/2016  Barry Wright [birth year] [id number]  Subjective: Barry Wright is a 82 yo who was referred to [facility] for medication reconciliation.  Phone call to patient to review medications with patient and/or his caregiver.  Phone was answered by patient who verified his name and date of birth.  Patient reports he is "doing pretty good."  Explained the purpose of the call.  Patient reports his medications are managed by Butch Penny at [facility] who assists patient in filling his pill box.  Patient denies any questions or concerns regarding his medications.  Patient provided me with permission to reach out to Butch Penny to review his medications.  Spoke to [place] at [facility] and reviewed patient's medication list.    Objective:   Encounter Medications: Outpatient Encounter Prescriptions as of 04/22/2016  Medication Sig Note  . acetaminophen (TYLENOL) 500 MG tablet Take 1,000 mg by mouth 2 (two) times daily.    Marland Kitchen aspirin 81 MG tablet Take 81 mg by mouth daily.   . calcium carbonate (OS-CAL - DOSED IN MG OF ELEMENTAL CALCIUM) 1250 MG tablet Take 1 tablet by mouth daily with breakfast.   . clopidogrel (PLAVIX) 75 MG tablet Take 75 mg by mouth daily.   04/12/2015: .   Marland Kitchen fexofenadine (ALLEGRA) 180 MG tablet Take 180 mg by mouth daily.   . folic acid (FOLVITE) 1 MG tablet Take 1 mg by mouth daily.   . furosemide (LASIX) 20 MG tablet Take 0.5 tablets (10 mg total) by mouth daily. (Patient taking differently: Take 20 mg by mouth daily. ) 03/04/2016: Per Notation: taking one 20 mg tablet daily.  Marland Kitchen gabapentin (NEURONTIN) 100 MG capsule Take 100 mg by mouth 3 (three) times daily. Per note: Takes 2 capsules three times/day.   . levofloxacin (LEVAQUIN) 250 MG tablet Take 1 tablet (250 mg total) by mouth daily. (Patient not taking: Reported on 03/04/2016)   . methotrexate (RHEUMATREX) 2.5 MG tablet Take 10 mg by mouth every Wednesday.    . Multiple Vitamins-Minerals (MULTIVITAMINS THER. W/MINERALS) TABS Take 1 tablet by mouth daily.     . nitroGLYCERIN (NITROSTAT) 0.4 MG SL tablet Place 0.4 mg under the tongue every 5 (five) minutes as needed for chest pain.   Marland Kitchen omeprazole (PRILOSEC) 20 MG capsule Take 20 mg by mouth daily.   . polyethylene glycol (MIRALAX / GLYCOLAX) packet Take 17 g by mouth daily as needed for mild constipation.    . predniSONE (DELTASONE) 5 MG tablet Take 5 mg by mouth daily.   Marland Kitchen SYNTHROID 100 MCG tablet Take 100 mcg by mouth daily before breakfast.  09/26/2015: Received from: External Pharmacy  . traMADol (ULTRAM) 50 MG tablet Take one tablet by mouth twice daily for pains PRN (Patient taking differently: Take 50 mg by mouth 2 (two) times daily. )    No facility-administered encounter medications on file as of 04/22/2016.    Functional Status: In your present state of health, do you have any difficulty performing the following activities: 03/04/2016 02/21/2016  Hearing? Tempie Donning  Vision? N N  Difficulty concentrating or making decisions? Tempie Donning  Walking or climbing stairs? Y Y  Dressing or bathing? Y Y  Doing errands, shopping? Tempie Donning  Preparing Food and eating ? Y -  Using the Toilet? Y -  In the past six months, have you accidently leaked urine? N -  Do you have problems with loss of bowel control?  N -  Managing your Medications? Y -  Managing your Finances? Y -  Housekeeping or managing your Housekeeping? Y -    Fall/Depression Screening: PHQ 2/9 Scores 03/04/2016  PHQ - 2 Score 0    Assessment:  Drugs sorted by system:  Neurologic/Psychologic: none  Cardiovascular: aspirin, clopidogrel, furosemide, nitroglycerin SL   Pulmonary/Allergy: fexofenadine  Gastrointestinal: omeprazole, polyethylene glycol   Endocrine: levothyroxine  Renal: none  Topical: none  Pain: acetaminophen, gabapentin, tramadol  Vitamins/Minerals: folic acid, multivitamin  Infectious Diseases: none    Miscellaneous: prednisone   Duplications in therapy: dual antiplatelet therapy - aspirin and clopidogrel  Gaps in therapy: none noted Medications to avoid in the elderly: omeprazole (risk of Clostridium difficile infection and bone loss and fractures) Drug interactions:  - Aspirin and clopidogrel - increase risk of bleed  - Clopidogrel and omeprazole - use of omeprazole may reduce the ability of clopidogrel to inhibit platelet aggregation Other issues noted: none noted   Plan: 1.  I will send patient's PCP a letter to recommend changing omeprazole a H2 blocker such as ranitidine if clinically appropriate.  Note this would also avoid the potential drug interaction between omeprazole and clopidogrel.   2.  Per review of patient's chart, patient was placed on clopidogrel following a TIA in 2012.  According to the [facility] trial, for most patients with ischemic stroke, the combined long-term use of aspirin and clopidogrel does not offer greater stroke prevention than either agent alone but does increase risk of bleeding complications.  Would consider discontinuation of aspirin if clinically appropriate.  Will include this recommendation in my letter to patient's PCP.  3.  Will close pharmacy program as no further intervention needed.  I will send inbasket to [facility] regarding case closure.     Elisabeth Most, Pharm.D. Pharmacy Resident [place] [phone]

## 2016-04-24 ENCOUNTER — Encounter: Payer: Self-pay | Admitting: Pharmacist

## 2016-04-28 ENCOUNTER — Ambulatory Visit: Payer: Medicare Other

## 2016-04-28 DIAGNOSIS — R262 Difficulty in walking, not elsewhere classified: Secondary | ICD-10-CM | POA: Diagnosis not present

## 2016-04-28 DIAGNOSIS — M6281 Muscle weakness (generalized): Secondary | ICD-10-CM

## 2016-04-28 NOTE — Therapy (Signed)
[place], [place], [address] Phone: [phone]   Fax:  [phone]  Physical Therapy Treatment  Patient Details  Name: Barry Wright MRN: [id number] Date of Birth: 1920/02/11 Referring Provider: Lajean Manes, MD  Encounter Date: 04/28/2016      PT End of Session - 04/28/16 1238    Visit Number 30   Number of Visits 34   Date for PT Re-Evaluation 05/09/16   Authorization Type KX modifier   PT Start Time 1100   PT Stop Time 1145   PT Time Calculation (min) 45 min   Equipment Utilized During Treatment Gait belt   Activity Tolerance Patient tolerated treatment well;Patient limited by fatigue   Behavior During Therapy [facility] for tasks assessed/performed      Past Medical History  Diagnosis Date  . Chronic back pain   . Hypercholesteremia   . Hypothyroidism   . CAD (coronary artery disease)   . Chronic kidney disease (CKD), stage III (moderate)   . Polymyalgia rheumatica ([place])   . Left bundle branch block   . Hx of CABG   . History of nephrectomy   . Internal carotid artery stenosis   . Myocardial infarction ([place])   . CHF (congestive heart failure) ([place])   . Stroke [facility])     Past Surgical History  Procedure Laterality Date  . Coronary artery bypass graft    . Cholecystectomy    . Kidney surgery      kidney removed    There were no vitals filed for this visit.      Subjective Assessment - 04/28/16 1110    Subjective Thought I Feel pretty good but walking hard today   Currently in Pain? No/denies                         [facility] Adult PT Treatment/Exercise - 04/28/16 0001    Ambulation/Gait   Gait Comments Walked 10 feet with walker to start then with 2 min rest  walked  30 feet with RW . He was mod to max asssist for pivoting to chair.    Lumbar Exercises: Stretches   Standing Side Bend --  8 reps in sitting RT and LT   Lumbar Exercises: Seated   Sit to Stand  Limitations x12 from higher surface with cues to stand erect andthen walked 5 feet to allow for wheelchair to be placed behind him to sit   Lumbar Exercises: Supine   Clam 20 reps  both legs simutaneously   Bent Knee Raise Limitations 10   Other Supine Lumbar Exercises Marching  4 pounds on ankles x 12, then LTR x 15 RT and LT then marching x 15 RT and LT 4 pounds then worked on rolling LT to sit but neededmod to max asssit to lift upper body to sitting and neede mod asssist to maintain balance initially in sitting   Knee/Hip Exercises: Supine   Short Arc Quad Sets Right;Left   Short Arc Quad Sets Limitations 25 3 sec hold x 25 reps   Heel Slides Strengthening;Right;15 reps   Heel Slides Limitations with 4 pounds trying to lift foot to 8 inch bolster at end of flexion movement   [place] Limitations x15 with knees on 8 inch bolster                  PT Short Term Goals - 04/14/16 1110    PT SHORT TERM GOAL #1  Title pt will be independent with intial HEP after review   Status Achieved   PT SHORT TERM GOAL #2   Title He will be able to walk in bars 100 feet or more with turning with only contact guard.   Status Deferred   PT SHORT TERM GOAL #3   Title He will be able to roll on mat RT and LT  without asssitance   Status On-going   PT SHORT TERM GOAL #4   Title He will be able to go sit to stand with min assist to walker   Status Achieved           PT Long Term Goals - 04/14/16 1110    PT LONG TERM GOAL #1   Title pt will perform sit to stand without assistance at his WC to walker   Status On-going   PT LONG TERM GOAL #2   Title pt will ambulate x 30 ft with RW CGA   Status Partially Met   PT LONG TERM GOAL #3   Title pt will tolerate standing activities in the clinic x 5 minutes to demonstrate improved endurance   Status On-going   PT LONG TERM GOAL #4   Title patient able to walk 50 feet with rolling walker and c.g with aide from W/C   Status On-going   PT LONG  TERM GOAL #5   Title He will be able to transfer  with RW and pivot fully before sitting with min to no verbal cues.   Status On-going   PT LONG TERM GOAL #6   Title He will be able to transfer with pivot correctly to chair after walking 50 feet with walker. after turning to walk back at 50 feet.    Status On-going               Plan - 05/14/2016 1235    Clinical Impression Statement He had more difficulty walking today but was able to complet the same distance. Another discussion with spouse about lack of progress and she feels he is not tryuing and I reminded her we have discussed this in past and Mr Monte[place]e tries to his ability  and the general ddecline may be just related to aging. Will probably discharge by end of month   PT Next Visit Plan Cointinue to ty to walk with RW and return to mat for exercise  Mat exercise next visit. Push walking distance if doing well    FOTO next visit   Consulted and Agree with Plan of Care Patient   Family Member Consulted spouse      Patient will benefit from skilled therapeutic intervention in order to improve the following deficits and impairments:  Abnormal gait, Difficulty walking, Decreased strength, Decreased range of motion, Decreased activity tolerance  Visit Diagnosis: Muscle weakness (generalized)  Difficulty in walking, not elsewhere classified       G-Codes - May 14, 2016 1239    Functional Assessment Tool Used clinical judgement   Functional Limitation Mobility: Walking and moving around   Mobility: Walking and Moving Around Current Status [phone]) At least 60 percent but less than 80 percent impaired, limited or restricted   Mobility: Walking and Moving Around Goal Status [phone]) At least 40 percent but less than 60 percent impaired, limited or restricted      Problem List Patient Active Problem List   Diagnosis Date Noted  . UTI (lower urinary tract infection) 02/21/2016  . AKI (acute kidney injury) ([place]) 02/21/2016  .  Pressure ulcer 09/29/2015  . Altered mental status   . Acute encephalopathy 09/26/2015  . Chronic back pain 09/26/2015  . Generalized weakness 04/13/2015  . Lobar pneumonia due to unspecified organism 04/13/2015  . Prolonged QT interval   . Weakness 04/11/2015  . CAP (community acquired pneumonia) 01/25/2015  . Dehydration 01/25/2015  . Chronic diastolic heart failure ([place]) 06/19/2014  . Essential hypertension, benign 03/08/2014  . Hypothyroidism 03/08/2014  . Protein-calorie malnutrition, moderate ([place]) 03/08/2014  . Constipation 03/08/2014  . Physical deconditioning 03/08/2014  . Hypokalemia 02/27/2014  . Abnormal serum protein electrophoresis 01/07/2013  . Chronic kidney disease (CKD), stage III (moderate) 01/03/2013  . Polymyalgia rheumatica ([place]) 01/03/2013  . Osteopenia 01/03/2013  . Carotid artery disease ([place]) 01/03/2013  . Spinal stenosis of lumbar region 01/03/2013  . Coronary artery disease 01/03/2013  . Old MI (myocardial infarction) 01/03/2013  . Transient cerebral ischemia 11/18/2011    [name]  PT 04/28/2016, 12:40 PM  [facility] [address] [place], [place], [address] Phone: [phone]   Fax:  [phone]  Name: ALEXZAVIER GIRARDIN MRN: [id number] Date of Birth: Mar 29, 1920

## 2016-04-30 DIAGNOSIS — M064 Inflammatory polyarthropathy: Secondary | ICD-10-CM | POA: Diagnosis not present

## 2016-04-30 DIAGNOSIS — M150 Primary generalized (osteo)arthritis: Secondary | ICD-10-CM | POA: Diagnosis not present

## 2016-04-30 DIAGNOSIS — Z79899 Other long term (current) drug therapy: Secondary | ICD-10-CM | POA: Diagnosis not present

## 2016-04-30 DIAGNOSIS — M353 Polymyalgia rheumatica: Secondary | ICD-10-CM | POA: Diagnosis not present

## 2016-05-06 DIAGNOSIS — I129 Hypertensive chronic kidney disease with stage 1 through stage 4 chronic kidney disease, or unspecified chronic kidney disease: Secondary | ICD-10-CM | POA: Diagnosis not present

## 2016-05-06 DIAGNOSIS — M4806 Spinal stenosis, lumbar region: Secondary | ICD-10-CM | POA: Diagnosis not present

## 2016-05-06 DIAGNOSIS — N183 Chronic kidney disease, stage 3 (moderate): Secondary | ICD-10-CM | POA: Diagnosis not present

## 2016-05-06 DIAGNOSIS — Q253 Supravalvular aortic stenosis: Secondary | ICD-10-CM | POA: Diagnosis not present

## 2016-05-06 DIAGNOSIS — L89322 Pressure ulcer of left buttock, stage 2: Secondary | ICD-10-CM | POA: Diagnosis not present

## 2016-05-06 DIAGNOSIS — I5032 Chronic diastolic (congestive) heart failure: Secondary | ICD-10-CM | POA: Diagnosis not present

## 2016-05-10 ENCOUNTER — Emergency Department (HOSPITAL_COMMUNITY): Payer: Medicare Other

## 2016-05-10 ENCOUNTER — Encounter (HOSPITAL_COMMUNITY): Payer: Self-pay | Admitting: Emergency Medicine

## 2016-05-10 ENCOUNTER — Inpatient Hospital Stay (HOSPITAL_COMMUNITY)
Admission: EM | Admit: 2016-05-10 | Discharge: 2016-05-12 | DRG: 689 | Disposition: A | Discharge status: Home / Self Care | Payer: Medicare Other | Attending: Internal Medicine | Admitting: Internal Medicine

## 2016-05-10 DIAGNOSIS — A419 Sepsis, unspecified organism: Secondary | ICD-10-CM | POA: Diagnosis not present

## 2016-05-10 DIAGNOSIS — Z7902 Long term (current) use of antithrombotics/antiplatelets: Secondary | ICD-10-CM

## 2016-05-10 DIAGNOSIS — Z9049 Acquired absence of other specified parts of digestive tract: Secondary | ICD-10-CM

## 2016-05-10 DIAGNOSIS — N183 Chronic kidney disease, stage 3 unspecified: Secondary | ICD-10-CM | POA: Diagnosis present

## 2016-05-10 DIAGNOSIS — E039 Hypothyroidism, unspecified: Secondary | ICD-10-CM | POA: Diagnosis present

## 2016-05-10 DIAGNOSIS — Z7952 Long term (current) use of systemic steroids: Secondary | ICD-10-CM

## 2016-05-10 DIAGNOSIS — M549 Dorsalgia, unspecified: Secondary | ICD-10-CM | POA: Diagnosis present

## 2016-05-10 DIAGNOSIS — M353 Polymyalgia rheumatica: Secondary | ICD-10-CM | POA: Diagnosis present

## 2016-05-10 DIAGNOSIS — E7800 Pure hypercholesterolemia, unspecified: Secondary | ICD-10-CM | POA: Diagnosis present

## 2016-05-10 DIAGNOSIS — I447 Left bundle-branch block, unspecified: Secondary | ICD-10-CM | POA: Diagnosis present

## 2016-05-10 DIAGNOSIS — G9349 Other encephalopathy: Secondary | ICD-10-CM | POA: Diagnosis present

## 2016-05-10 DIAGNOSIS — Z7982 Long term (current) use of aspirin: Secondary | ICD-10-CM

## 2016-05-10 DIAGNOSIS — I5022 Chronic systolic (congestive) heart failure: Secondary | ICD-10-CM | POA: Diagnosis present

## 2016-05-10 DIAGNOSIS — I2510 Atherosclerotic heart disease of native coronary artery without angina pectoris: Secondary | ICD-10-CM | POA: Diagnosis present

## 2016-05-10 DIAGNOSIS — Z87891 Personal history of nicotine dependence: Secondary | ICD-10-CM

## 2016-05-10 DIAGNOSIS — R531 Weakness: Secondary | ICD-10-CM | POA: Diagnosis not present

## 2016-05-10 DIAGNOSIS — G459 Transient cerebral ischemic attack, unspecified: Secondary | ICD-10-CM | POA: Diagnosis not present

## 2016-05-10 DIAGNOSIS — R918 Other nonspecific abnormal finding of lung field: Secondary | ICD-10-CM | POA: Diagnosis not present

## 2016-05-10 DIAGNOSIS — Z79899 Other long term (current) drug therapy: Secondary | ICD-10-CM

## 2016-05-10 DIAGNOSIS — N390 Urinary tract infection, site not specified: Principal | ICD-10-CM | POA: Diagnosis present

## 2016-05-10 DIAGNOSIS — G8929 Other chronic pain: Secondary | ICD-10-CM | POA: Diagnosis present

## 2016-05-10 DIAGNOSIS — Z880 Allergy status to penicillin: Secondary | ICD-10-CM

## 2016-05-10 DIAGNOSIS — R410 Disorientation, unspecified: Secondary | ICD-10-CM | POA: Diagnosis not present

## 2016-05-10 DIAGNOSIS — R69 Illness, unspecified: Secondary | ICD-10-CM | POA: Diagnosis not present

## 2016-05-10 DIAGNOSIS — Z905 Acquired absence of kidney: Secondary | ICD-10-CM

## 2016-05-10 DIAGNOSIS — Z8249 Family history of ischemic heart disease and other diseases of the circulatory system: Secondary | ICD-10-CM

## 2016-05-10 DIAGNOSIS — Z8673 Personal history of transient ischemic attack (TIA), and cerebral infarction without residual deficits: Secondary | ICD-10-CM

## 2016-05-10 DIAGNOSIS — G9340 Encephalopathy, unspecified: Secondary | ICD-10-CM | POA: Diagnosis not present

## 2016-05-10 DIAGNOSIS — J90 Pleural effusion, not elsewhere classified: Secondary | ICD-10-CM | POA: Diagnosis not present

## 2016-05-10 DIAGNOSIS — I252 Old myocardial infarction: Secondary | ICD-10-CM

## 2016-05-10 DIAGNOSIS — Z951 Presence of aortocoronary bypass graft: Secondary | ICD-10-CM

## 2016-05-10 LAB — CBC WITH DIFFERENTIAL/PLATELET
BASOS ABS: 0 10*3/uL (ref 0.0–0.1)
BASOS PCT: 0 %
Eosinophils Absolute: 0.3 10*3/uL (ref 0.0–0.7)
Eosinophils Relative: 2 %
HEMATOCRIT: 43.5 % (ref 39.0–52.0)
HEMOGLOBIN: 14.4 g/dL (ref 13.0–17.0)
Lymphocytes Relative: 22 %
Lymphs Abs: 3.4 10*3/uL (ref 0.7–4.0)
MCH: 34 pg (ref 26.0–34.0)
MCHC: 33.1 g/dL (ref 30.0–36.0)
MCV: 102.8 fL — ABNORMAL HIGH (ref 78.0–100.0)
Monocytes Absolute: 1.3 10*3/uL — ABNORMAL HIGH (ref 0.1–1.0)
Monocytes Relative: 9 %
NEUTROS ABS: 10.6 10*3/uL — AB (ref 1.7–7.7)
NEUTROS PCT: 67 %
Platelets: 193 10*3/uL (ref 150–400)
RBC: 4.23 MIL/uL (ref 4.22–5.81)
RDW: 15.5 % (ref 11.5–15.5)
WBC: 15.6 10*3/uL — AB (ref 4.0–10.5)

## 2016-05-10 LAB — COMPREHENSIVE METABOLIC PANEL
ALK PHOS: 65 U/L (ref 38–126)
ALT: 10 U/L — ABNORMAL LOW (ref 17–63)
ANION GAP: 8 (ref 5–15)
AST: 15 U/L (ref 15–41)
Albumin: 3.3 g/dL — ABNORMAL LOW (ref 3.5–5.0)
BILIRUBIN TOTAL: 0.8 mg/dL (ref 0.3–1.2)
BUN: 24 mg/dL — ABNORMAL HIGH (ref 6–20)
CALCIUM: 8.8 mg/dL — AB (ref 8.9–10.3)
CO2: 28 mmol/L (ref 22–32)
Chloride: 103 mmol/L (ref 101–111)
Creatinine, Ser: 1.55 mg/dL — ABNORMAL HIGH (ref 0.61–1.24)
GFR calc non Af Amer: 37 mL/min — ABNORMAL LOW (ref >60)
GFR, EST AFRICAN AMERICAN: 43 mL/min — AB (ref >60)
Glucose, Bld: 116 mg/dL — ABNORMAL HIGH (ref 65–99)
Potassium: 3.4 mmol/L — ABNORMAL LOW (ref 3.5–5.1)
Sodium: 139 mmol/L (ref 135–145)
TOTAL PROTEIN: 6.1 g/dL — AB (ref 6.5–8.1)

## 2016-05-10 LAB — URINALYSIS, ROUTINE W REFLEX MICROSCOPIC
BILIRUBIN URINE: NEGATIVE
Glucose, UA: NEGATIVE mg/dL
KETONES UR: NEGATIVE mg/dL
NITRITE: NEGATIVE
Protein, ur: 30 mg/dL — AB
Specific Gravity, Urine: 1.015 (ref 1.005–1.030)
pH: 6.5 (ref 5.0–8.0)

## 2016-05-10 LAB — I-STAT TROPONIN, ED: TROPONIN I, POC: 0.02 ng/mL (ref 0.00–0.08)

## 2016-05-10 LAB — URINE MICROSCOPIC-ADD ON

## 2016-05-10 LAB — CBG MONITORING, ED: Glucose-Capillary: 103 mg/dL — ABNORMAL HIGH (ref 65–99)

## 2016-05-10 LAB — I-STAT CG4 LACTIC ACID, ED: Lactic Acid, Venous: 1.23 mmol/L (ref 0.5–1.9)

## 2016-05-10 MED ORDER — TRAMADOL HCL 50 MG PO TABS: 50.0000 mg | ORAL_TABLET | Freq: Two times a day (BID) | ORAL | Status: DC | PRN

## 2016-05-10 MED ORDER — GABAPENTIN 100 MG PO CAPS
200.0000 mg | ORAL_CAPSULE | Freq: Three times a day (TID) | ORAL | Status: DC
  Administered 2016-05-10 – 2016-05-12 (×5): 200 mg via ORAL
  Filled 2016-05-10 (×5): qty 2

## 2016-05-10 MED ORDER — FUROSEMIDE 40 MG PO TABS: 20.0000 mg | ORAL_TABLET | Freq: Every day | ORAL | Status: DC

## 2016-05-10 MED ORDER — POLYETHYLENE GLYCOL 3350 17 G PO PACK: 17.0000 g | PACK | Freq: Every day | ORAL | Status: DC | PRN

## 2016-05-10 MED ORDER — DEXTROSE-NACL 5-0.45 % IV SOLN
INTRAVENOUS | Status: DC
  Administered 2016-05-10 – 2016-05-11 (×2): via INTRAVENOUS

## 2016-05-10 MED ORDER — FOLIC ACID 1 MG PO TABS
1.0000 mg | ORAL_TABLET | Freq: Every day | ORAL | Status: DC
  Administered 2016-05-11 – 2016-05-12 (×2): 1 mg via ORAL
  Filled 2016-05-10 (×2): qty 1

## 2016-05-10 MED ORDER — ASPIRIN EC 81 MG PO TBEC
81.0000 mg | DELAYED_RELEASE_TABLET | Freq: Every day | ORAL | Status: DC
  Administered 2016-05-11 – 2016-05-12 (×2): 81 mg via ORAL
  Filled 2016-05-10 (×2): qty 1

## 2016-05-10 MED ORDER — PREDNISONE 5 MG PO TABS
5.0000 mg | ORAL_TABLET | Freq: Every day | ORAL | Status: DC
  Administered 2016-05-11 – 2016-05-12 (×2): 5 mg via ORAL
  Filled 2016-05-10 (×2): qty 1

## 2016-05-10 MED ORDER — CEFTRIAXONE SODIUM 1 G IJ SOLR
1.0000 g | Freq: Once | INTRAMUSCULAR | Status: AC
  Administered 2016-05-10: 1 g via INTRAVENOUS
  Filled 2016-05-10: qty 10

## 2016-05-10 MED ORDER — NITROGLYCERIN 0.4 MG SL SUBL: 0.4000 mg | SUBLINGUAL_TABLET | SUBLINGUAL | Status: DC | PRN

## 2016-05-10 MED ORDER — ACETAMINOPHEN 500 MG PO TABS
1000.0000 mg | ORAL_TABLET | Freq: Two times a day (BID) | ORAL | Status: DC
  Administered 2016-05-10 – 2016-05-12 (×4): 1000 mg via ORAL
  Filled 2016-05-10 (×4): qty 2

## 2016-05-10 MED ORDER — LEVOTHYROXINE SODIUM 100 MCG PO TABS
100.0000 ug | ORAL_TABLET | Freq: Every day | ORAL | Status: DC
  Administered 2016-05-11 – 2016-05-12 (×2): 100 ug via ORAL
  Filled 2016-05-10 (×2): qty 1

## 2016-05-10 MED ORDER — CLOPIDOGREL BISULFATE 75 MG PO TABS
75.0000 mg | ORAL_TABLET | Freq: Every day | ORAL | Status: DC
  Administered 2016-05-11 – 2016-05-12 (×2): 75 mg via ORAL
  Filled 2016-05-10 (×2): qty 1

## 2016-05-10 MED ORDER — DEXTROSE 5 % IV SOLN
1.0000 g | INTRAVENOUS | Status: DC
  Administered 2016-05-11 – 2016-05-12 (×2): 1 g via INTRAVENOUS
  Filled 2016-05-10 (×2): qty 10

## 2016-05-10 MED ORDER — ENOXAPARIN SODIUM 40 MG/0.4ML ~~LOC~~ SOLN
40.0000 mg | SUBCUTANEOUS | Status: DC
  Administered 2016-05-10 – 2016-05-11 (×2): 40 mg via SUBCUTANEOUS
  Filled 2016-05-10 (×2): qty 0.4

## 2016-05-10 MED ORDER — CETYLPYRIDINIUM CHLORIDE 0.05 % MT LIQD
7.0000 mL | Freq: Two times a day (BID) | OROMUCOSAL | Status: DC
  Administered 2016-05-10 – 2016-05-12 (×4): 7 mL via OROMUCOSAL

## 2016-05-10 MED ORDER — LORATADINE 10 MG PO TABS: 10.0000 mg | ORAL_TABLET | Freq: Every day | ORAL | Status: DC

## 2016-05-10 MED ORDER — ADULT MULTIVITAMIN W/MINERALS CH
1.0000 | ORAL_TABLET | Freq: Every day | ORAL | Status: DC
  Administered 2016-05-11 – 2016-05-12 (×2): 1 via ORAL
  Filled 2016-05-10 (×4): qty 1

## 2016-05-10 MED ORDER — ENSURE ENLIVE PO LIQD
237.0000 mL | Freq: Two times a day (BID) | ORAL | Status: DC
  Administered 2016-05-11 – 2016-05-12 (×3): 237 mL via ORAL

## 2016-05-10 MED ORDER — CALCIUM CARBONATE 1250 (500 CA) MG PO TABS
1.0000 | ORAL_TABLET | Freq: Every day | ORAL | Status: DC
  Administered 2016-05-11 – 2016-05-12 (×2): 500 mg via ORAL
  Filled 2016-05-10 (×2): qty 1

## 2016-05-10 NOTE — ED Notes (Signed)
Per EMS, pt from home, lives with his wife and daughter with a nurse aide who comes to the house. Woke up this morning with weakness, unable to get up, unable to eat/drink. Denies any pain

## 2016-05-10 NOTE — Progress Notes (Signed)
Pt. Arrived to floor via stretcher from ED. Alert and oriented to person, no family at bedside. Incontinent of urine and no respiratory distress noted. Continue with admission process.

## 2016-05-10 NOTE — ED Provider Notes (Signed)
CSN: [id number]     Arrival date & time 05/10/16  1051 History   First MD Initiated Contact with Patient 05/10/16 1055     Chief Complaint  Patient presents with  . Weakness     (Consider location/radiation/quality/duration/timing/severity/associated sxs/prior Treatment) Patient is a 82 y.o. male presenting with weakness. The history is provided by the patient.  Weakness This is a new problem. The current episode started less than 1 hour ago. The problem occurs constantly. The problem has not changed since onset.Pertinent negatives include no chest pain, no abdominal pain, no headaches and no shortness of breath. Nothing aggravates the symptoms. Nothing relieves the symptoms. He has tried nothing for the symptoms. The treatment provided no relief.    82 yo M With a chief complaints of weakness. Patient has not been eating and difficulty getting him up out of bed and into his wheelchair. He has had similar presentations like this that were associated with urinary tract infections. Denies fever chills denies abdominal pain denies cough congestion.  Past Medical History  Diagnosis Date  . Chronic back pain   . Hypercholesteremia   . Hypothyroidism   . CAD (coronary artery disease)   . Chronic kidney disease (CKD), stage III (moderate)   . Polymyalgia rheumatica ([place])   . Left bundle branch block   . Hx of CABG   . History of nephrectomy   . Internal carotid artery stenosis   . Myocardial infarction ([place])   . CHF (congestive heart failure) ([place])   . Stroke [facility])    Past Surgical History  Procedure Laterality Date  . Coronary artery bypass graft    . Cholecystectomy    . Kidney surgery      kidney removed   Family History  Problem Relation Age of Onset  . Other Mother     [place] Flu  . Heart disease Father    Social History  Substance Use Topics  . Smoking status: Former Smoker    Types: Cigarettes    Quit date: 11/10/1970  . Smokeless tobacco: Never Used  . Alcohol  Use: No    Review of Systems  Constitutional: Negative for fever and chills.  HENT: Negative for congestion and facial swelling.   Eyes: Negative for discharge and visual disturbance.  Respiratory: Negative for shortness of breath.   Cardiovascular: Negative for chest pain and palpitations.  Gastrointestinal: Negative for vomiting, abdominal pain and diarrhea.  Musculoskeletal: Negative for myalgias and arthralgias.  Skin: Negative for color change and rash.  Neurological: Positive for weakness. Negative for tremors, syncope and headaches.  Psychiatric/Behavioral: Negative for confusion and dysphoric mood.      Allergies  Penicillins  Home Medications   Prior to Admission medications   Medication Sig Start Date End Date Taking? Authorizing Provider  acetaminophen (TYLENOL) 500 MG tablet Take 1,000 mg by mouth 2 (two) times daily.     Historical Provider, MD  aspirin 81 MG tablet Take 81 mg by mouth daily.    Historical Provider, MD  calcium carbonate (OS-CAL - DOSED IN MG OF ELEMENTAL CALCIUM) 1250 MG tablet Take 1 tablet by mouth daily with breakfast.    Historical Provider, MD  clopidogrel (PLAVIX) 75 MG tablet Take 75 mg by mouth daily.      Historical Provider, MD  fexofenadine (ALLEGRA) 180 MG tablet Take 180 mg by mouth daily.    Historical Provider, MD  folic acid (FOLVITE) 1 MG tablet Take 1 mg by mouth daily.    Historical  Provider, MD  furosemide (LASIX) 20 MG tablet Take 0.5 tablets (10 mg total) by mouth daily. Patient taking differently: Take 20 mg by mouth daily.  01/30/15   Theodis Blaze, MD  gabapentin (NEURONTIN) 100 MG capsule Take 100 mg by mouth 3 (three) times daily. Per note: Takes 2 capsules three times/day.    Historical Provider, MD  methotrexate (RHEUMATREX) 2.5 MG tablet Take 10 mg by mouth every Wednesday.    Historical Provider, MD  Multiple Vitamins-Minerals (MULTIVITAMINS THER. W/MINERALS) TABS Take 1 tablet by mouth daily.      Historical Provider, MD   nitroGLYCERIN (NITROSTAT) 0.4 MG SL tablet Place 0.4 mg under the tongue every 5 (five) minutes as needed for chest pain. Reported on 04/23/2016    Historical Provider, MD  omeprazole (PRILOSEC) 20 MG capsule Take 20 mg by mouth daily.    Historical Provider, MD  polyethylene glycol (MIRALAX / GLYCOLAX) packet Take 17 g by mouth daily as needed for mild constipation.     Historical Provider, MD  predniSONE (DELTASONE) 5 MG tablet Take 5 mg by mouth daily.    Historical Provider, MD  SYNTHROID 100 MCG tablet Take 100 mcg by mouth daily before breakfast.  09/19/15   Historical Provider, MD  traMADol (ULTRAM) 50 MG tablet Take one tablet by mouth twice daily for pains PRN Patient taking differently: Take 50 mg by mouth 2 (two) times daily.  09/29/15   Belkys A Regalado, MD   BP 116/56 mmHg  Pulse 95  Temp(Src) 97.5 F (36.4 C) (Oral)  Resp 20  SpO2 98% Physical Exam  Constitutional: He is oriented to person, place, and time. He appears well-developed and well-nourished.  HENT:  Head: Normocephalic and atraumatic.  Eyes: EOM are normal. Pupils are equal, round, and reactive to light.  Neck: Normal range of motion. Neck supple. No JVD present.  Cardiovascular: Normal rate and regular rhythm.  Exam reveals no gallop and no friction rub.   No murmur heard. Pulmonary/Chest: No respiratory distress. He has no wheezes.  Abdominal: He exhibits no distension. There is no tenderness. There is no rebound and no guarding.  Musculoskeletal: Normal range of motion.  Neurological: He is alert and oriented to person, place, and time. GCS eye subscore is 4. GCS verbal subscore is 4. GCS motor subscore is 6.  Skin: No rash noted. No pallor.  Psychiatric: He has a normal mood and affect. His behavior is normal.  Nursing note and vitals reviewed.   ED Course  Procedures (including critical care time) Labs Review Labs Reviewed  COMPREHENSIVE METABOLIC PANEL - Abnormal; Notable for the following:     Potassium 3.4 (*)    Glucose, Bld 116 (*)    BUN 24 (*)    Creatinine, Ser 1.55 (*)    Calcium 8.8 (*)    Total Protein 6.1 (*)    Albumin 3.3 (*)    ALT 10 (*)    GFR calc non Af Amer 37 (*)    GFR calc Af Amer 43 (*)    All other components within normal limits  CBC WITH DIFFERENTIAL/PLATELET - Abnormal; Notable for the following:    WBC 15.6 (*)    MCV 102.8 (*)    Neutro Abs 10.6 (*)    Monocytes Absolute 1.3 (*)    All other components within normal limits  URINALYSIS, ROUTINE W REFLEX MICROSCOPIC (NOT AT [facility]) - Abnormal; Notable for the following:    APPearance CLOUDY (*)    Hgb urine  dipstick MODERATE (*)    Protein, ur 30 (*)    Leukocytes, UA LARGE (*)    All other components within normal limits  URINE MICROSCOPIC-ADD ON - Abnormal; Notable for the following:    Squamous Epithelial / LPF 0-5 (*)    Bacteria, UA MANY (*)    All other components within normal limits  CBG MONITORING, ED - Abnormal; Notable for the following:    Glucose-Capillary 103 (*)    All other components within normal limits  CULTURE, BLOOD (ROUTINE X 2)  CULTURE, BLOOD (ROUTINE X 2)  URINE CULTURE  I-STAT CG4 LACTIC ACID, ED  I-STAT TROPOININ, ED  I-STAT CG4 LACTIC ACID, ED    Imaging Review Dg Chest 2 View  05/10/2016  CLINICAL DATA:  Sepsis EXAM: CHEST  2 VIEW COMPARISON:  09/26/15 chest radiograph. FINDINGS: Sternotomy wires appear aligned and intact. CABG clips overlie the mediastinum Stable cardiomediastinal silhouette with mild cardiomegaly. No pneumothorax. Trace bilateral pleural effusions. Mild pulmonary edema. Minimal hazy opacities in the lower lobes. IMPRESSION: 1. Mild congestive heart failure . 2. Trace bilateral pleural effusions. 3. Minimal hazy bilateral lower lobe opacities, favor atelectasis and/or edema. Electronically Signed   By: Ilona Sorrel M.D.   On: 05/10/2016 11:28   Ct Head Wo Contrast  05/10/2016  CLINICAL DATA:  Sepsis.  History stroke.  Weakness. EXAM: CT HEAD  WITHOUT CONTRAST TECHNIQUE: Contiguous axial images were obtained from the base of the skull through the vertex without intravenous contrast. COMPARISON:  CT head 02/21/2016 FINDINGS: Moderate to advanced atrophy. Chronic microvascular ischemic changes in the white matter are stable. Negative for acute infarct.  Negative for acute hemorrhage or mass. Negative calvarium. Air-fluid level sphenoid sinus which has improved in the interval. No worrisome skull lesion. IMPRESSION: Atrophy and chronic ischemic change. No acute intracranial abnormality Improvement in air-fluid level sphenoid sinus. Electronically Signed   By: Franchot Gallo M.D.   On: 05/10/2016 11:45   I have personally reviewed and evaluated these images and lab results as part of my medical decision-making.   EKG Interpretation   Date/Time:  Saturday May 10 2016 11:05:19 EDT Ventricular Rate:  90 PR Interval:    QRS Duration: 146 QT Interval:  406 QTC Calculation: 497 R Axis:   -104 Text Interpretation:  Sinus rhythm Right bundle branch block Probable  anterolateral infarct, recent No significant change since last tracing  Confirmed by Zack Crager MD, Quillian Quince [id number]) on 05/10/2016 11:32:50 AM      MDM   Final diagnoses:  UTI (lower urinary tract infection)    82 yo M with a chief complaint of weakness.  Found to have UTI.  As patient is below his baseline will discuss with hospitalist for admission.   The patients results and plan were reviewed and discussed.   Any x-rays performed were independently reviewed by myself.   Differential diagnosis were considered with the presenting HPI.  Medications  cefTRIAXone (ROCEPHIN) 1 g in dextrose 5 % 50 mL IVPB (not administered)  acetaminophen (TYLENOL) tablet 1,000 mg (not administered)  aspirin tablet 81 mg (not administered)  calcium carbonate (OS-CAL - dosed in mg of elemental calcium) tablet 500 mg of elemental calcium (not administered)  clopidogrel (PLAVIX) tablet 75 mg (not  administered)  loratadine (CLARITIN) tablet 10 mg (not administered)  folic acid (FOLVITE) tablet 1 mg (not administered)  gabapentin (NEURONTIN) capsule 100 mg (not administered)  multivitamins ther. w/minerals tablet 1 tablet (not administered)  nitroGLYCERIN (NITROSTAT) SL tablet 0.4 mg (not administered)  polyethylene glycol (MIRALAX / GLYCOLAX) packet 17 g (not administered)  predniSONE (DELTASONE) tablet 5 mg (not administered)  levothyroxine (SYNTHROID, LEVOTHROID) tablet 100 mcg (not administered)  traMADol (ULTRAM) tablet 50 mg (not administered)  enoxaparin (LOVENOX) injection 40 mg (not administered)  dextrose 5 %-0.45 % sodium chloride infusion (not administered)  cefTRIAXone (ROCEPHIN) 1 g in dextrose 5 % 50 mL IVPB (not administered)    Filed Vitals:   05/10/16 1300 05/10/16 1330 05/10/16 1431 05/10/16 1512  BP: 119/57 112/56 113/59 116/56  Pulse: 88 88 91 95  Temp:      TempSrc:      Resp: 17 25 26 20   SpO2: 90% 92% 91% 98%    Final diagnoses:  UTI (lower urinary tract infection)        Deno Etienne, DO 05/10/16 1514

## 2016-05-10 NOTE — H&P (Signed)
History and Physical  Barry Wright [id number] DOB: 12/09/1919 DOA: 05/10/2016  PCP:  Mathews Argyle, MD   Chief Complaint:  Confusion   History of Present Illness:  Patient is a 82 yo male with history of CAD, CHF, PMR, CKDIII who was brought to the ED with cc of confusion and weakness. The patient is a very poor historian and there was no family members at bedside so history was taken from ER physician. The patient has had difficulty getting into his wheelchair starting today. There is no fever/chills, chest pain/dyspnea, cough, N/V/D/C/abd pain. The patient denied any dysuria/frequency. He denied any complaints.   Review of Systems:  Poor historian. Neg except as per HPI  Past Medical and Surgical History:   Past Medical History  Diagnosis Date  . Chronic back pain   . Hypercholesteremia   . Hypothyroidism   . CAD (coronary artery disease)   . Chronic kidney disease (CKD), stage III (moderate)   . Polymyalgia rheumatica ([place])   . Left bundle branch block   . Hx of CABG   . History of nephrectomy   . Internal carotid artery stenosis   . Myocardial infarction ([place])   . CHF (congestive heart failure) ([place])   . Stroke [facility])    Past Surgical History  Procedure Laterality Date  . Coronary artery bypass graft    . Cholecystectomy    . Kidney surgery      kidney removed    Social History:   reports that he quit smoking about 45 years ago. His smoking use included Cigarettes. He has never used smokeless tobacco. He reports that he does not drink alcohol or use illicit drugs.    Allergies  Allergen Reactions  . Penicillins Rash    Has patient had a PCN reaction causing immediate rash, facial/tongue/throat swelling, SOB or lightheadedness with hypotension: Unknown Has patient had a PCN reaction causing severe rash involving mucus membranes or skin necrosis: Unknown Has patient had a PCN reaction that required hospitalization: Unknown Has patient had a  PCN reaction occurring within the last 10 years: Unknown If all of the above answers are "NO", then may proceed with Cephalosporin use.     Family History  Problem Relation Age of Onset  . Other Mother     [place] Flu  . Heart disease Father       Prior to Admission medications   Medication Sig Start Date End Date Taking? Authorizing Provider  acetaminophen (TYLENOL) 500 MG tablet Take 1,000 mg by mouth 2 (two) times daily.     Historical Provider, MD  aspirin 81 MG tablet Take 81 mg by mouth daily.    Historical Provider, MD  calcium carbonate (OS-CAL - DOSED IN MG OF ELEMENTAL CALCIUM) 1250 MG tablet Take 1 tablet by mouth daily with breakfast.    Historical Provider, MD  clopidogrel (PLAVIX) 75 MG tablet Take 75 mg by mouth daily.      Historical Provider, MD  fexofenadine (ALLEGRA) 180 MG tablet Take 180 mg by mouth daily.    Historical Provider, MD  folic acid (FOLVITE) 1 MG tablet Take 1 mg by mouth daily.    Historical Provider, MD  furosemide (LASIX) 20 MG tablet Take 0.5 tablets (10 mg total) by mouth daily. Patient taking differently: Take 20 mg by mouth daily.  01/30/15   Theodis Blaze, MD  gabapentin (NEURONTIN) 100 MG capsule Take 100 mg by mouth 3 (three) times daily. Per note: Takes 2 capsules three times/day.  Historical Provider, MD  methotrexate (RHEUMATREX) 2.5 MG tablet Take 10 mg by mouth every Wednesday.    Historical Provider, MD  Multiple Vitamins-Minerals (MULTIVITAMINS THER. W/MINERALS) TABS Take 1 tablet by mouth daily.      Historical Provider, MD  nitroGLYCERIN (NITROSTAT) 0.4 MG SL tablet Place 0.4 mg under the tongue every 5 (five) minutes as needed for chest pain. Reported on 04/23/2016    Historical Provider, MD  omeprazole (PRILOSEC) 20 MG capsule Take 20 mg by mouth daily.    Historical Provider, MD  polyethylene glycol (MIRALAX / GLYCOLAX) packet Take 17 g by mouth daily as needed for mild constipation.     Historical Provider, MD  predniSONE  (DELTASONE) 5 MG tablet Take 5 mg by mouth daily.    Historical Provider, MD  SYNTHROID 100 MCG tablet Take 100 mcg by mouth daily before breakfast.  09/19/15   Historical Provider, MD  traMADol (ULTRAM) 50 MG tablet Take one tablet by mouth twice daily for pains PRN Patient taking differently: Take 50 mg by mouth 2 (two) times daily.  09/29/15   Belkys A Regalado, MD    Physical Exam: BP 113/59 mmHg  Pulse 91  Temp(Src) 97.5 F (36.4 C) (Oral)  Resp 26  SpO2 91%  GENERAL :   Alert and cooperative, and appears to be in no acute distress. HEAD:           normocephalic. EYES:            PERRL, EOMI.  vision is grossly intact. EARS:           hearing grossly intact. NOSE:           No nasal discharge. THROAT:     Oral cavity and pharynx normal.   NECK:          supple, non-tender. N CARDIAC:    Normal S1 and S2. No gallop. No murmurs.  Vascular:     no peripheral edema.  LUNGS:       Clear to auscultation  ABDOMEN: Positive bowel sounds. Soft, nondistended, nontender. No guarding or rebound.      MSK:           No joint erythema or tenderness.  EXT           : No significant deformity or joint abnormality. Neuro        : Alert, oriented to person but not to place, and time. SKIN:            No rash. No lesions. PSYCH:       No hallucination. Patient is not suicidal.          Labs on Admission:  Reviewed.   Radiological Exams on Admission: Dg Chest 2 View  05/10/2016  CLINICAL DATA:  Sepsis EXAM: CHEST  2 VIEW COMPARISON:  09/26/15 chest radiograph. FINDINGS: Sternotomy wires appear aligned and intact. CABG clips overlie the mediastinum Stable cardiomediastinal silhouette with mild cardiomegaly. No pneumothorax. Trace bilateral pleural effusions. Mild pulmonary edema. Minimal hazy opacities in the lower lobes. IMPRESSION: 1. Mild congestive heart failure . 2. Trace bilateral pleural effusions. 3. Minimal hazy bilateral lower lobe opacities, favor atelectasis and/or edema.  Electronically Signed   By: Ilona Sorrel M.D.   On: 05/10/2016 11:28   Ct Head Wo Contrast  05/10/2016  CLINICAL DATA:  Sepsis.  History stroke.  Weakness. EXAM: CT HEAD WITHOUT CONTRAST TECHNIQUE: Contiguous axial images were obtained from the base of the skull through the vertex without  intravenous contrast. COMPARISON:  CT head 02/21/2016 FINDINGS: Moderate to advanced atrophy. Chronic microvascular ischemic changes in the white matter are stable. Negative for acute infarct.  Negative for acute hemorrhage or mass. Negative calvarium. Air-fluid level sphenoid sinus which has improved in the interval. No worrisome skull lesion. IMPRESSION: Atrophy and chronic ischemic change. No acute intracranial abnormality Improvement in air-fluid level sphenoid sinus. Electronically Signed   By: Franchot Gallo M.D.   On: 05/10/2016 11:45     Assessment/Plan  UTI:  Started on Rocephin IV, Ucx pending  Previous cultures grew Enterococcus but it was not through to be pathogenic.   Confusion/weakness: likely due to above. PT consult before discharge.   CKD: cr at baseline.   CAD: cont asp and plavix , continue statin.   PMR: cont home medications.   CHF: will start gentle hydration for UTI, to be monitored for fluid status, holding lasix for today.  Input & Output: NA Lines & Tubes: PIV DVT prophylaxis:  [place] enoxaparin  GI prophylaxis: NA Consultants: NA Code Status: Full Family Communication: none at bedside   Disposition Plan: Obs     Gennaro Africa M.D [name]

## 2016-05-10 NOTE — ED Notes (Signed)
Rn will need to collect 2nd blood culture with start of IV

## 2016-05-10 NOTE — ED Notes (Addendum)
Patient's wife, [name], called and wanted an update on her husband. Informed patient we weren't allowed to give out medical information over the phone without the patient's permission. Once the MD informs myself and the patient of the plan of action, I will get the patient's consent and call [name] back with more information.  [phone]  On - call home care provider [phone]

## 2016-05-10 NOTE — Progress Notes (Addendum)
Pharmacy Antibiotic Note  Barry Wright is a 82 y.o. male admitted on 05/10/2016 with UTI.  Pharmacy has been consulted for Rocephin dosing. PCN allergy noted- patient has previously tolerated Rocephin.   Plan: Rocephin 1gm IV q24h Pharmacy to sign off- Please re-consult as needed.     Temp (24hrs), Avg:97.5 F (36.4 C), Min:97.5 F (36.4 C), Max:97.5 F (36.4 C)   Recent Labs Lab 05/10/16 1130 05/10/16 1140  WBC 15.6*  --   CREATININE 1.55*  --   LATICACIDVEN  --  1.23    CrCl cannot be calculated (Unknown ideal weight.).    Allergies  Allergen Reactions  . Penicillins Rash    Has patient had a PCN reaction causing immediate rash, facial/tongue/throat swelling, SOB or lightheadedness with hypotension: Unknown Has patient had a PCN reaction causing severe rash involving mucus membranes or skin necrosis: Unknown Has patient had a PCN reaction that required hospitalization: Unknown Has patient had a PCN reaction occurring within the last 10 years: Unknown If all of the above answers are "NO", then may proceed with Cephalosporin use.     Antimicrobials this admission: 7/1 Rocephin >>   Microbiology results: 7/1 BCx: IP 7/1 UCx: IP   Thank you for allowing pharmacy to be a part of this patient's care.  Biagio Borg 05/10/2016 2:53 PM

## 2016-05-10 NOTE — ED Notes (Signed)
Bed: [id number] Expected date:  Expected time:  Means of arrival:  Comments: 82 yo Evaluation-not eating/drinking

## 2016-05-11 DIAGNOSIS — Z7952 Long term (current) use of systemic steroids: Secondary | ICD-10-CM | POA: Diagnosis not present

## 2016-05-11 DIAGNOSIS — M549 Dorsalgia, unspecified: Secondary | ICD-10-CM | POA: Diagnosis present

## 2016-05-11 DIAGNOSIS — Z9049 Acquired absence of other specified parts of digestive tract: Secondary | ICD-10-CM | POA: Diagnosis not present

## 2016-05-11 DIAGNOSIS — G459 Transient cerebral ischemic attack, unspecified: Secondary | ICD-10-CM | POA: Diagnosis present

## 2016-05-11 DIAGNOSIS — Z905 Acquired absence of kidney: Secondary | ICD-10-CM | POA: Diagnosis not present

## 2016-05-11 DIAGNOSIS — Z880 Allergy status to penicillin: Secondary | ICD-10-CM | POA: Diagnosis not present

## 2016-05-11 DIAGNOSIS — N390 Urinary tract infection, site not specified: Secondary | ICD-10-CM | POA: Diagnosis present

## 2016-05-11 DIAGNOSIS — I447 Left bundle-branch block, unspecified: Secondary | ICD-10-CM | POA: Diagnosis present

## 2016-05-11 DIAGNOSIS — Z7902 Long term (current) use of antithrombotics/antiplatelets: Secondary | ICD-10-CM | POA: Diagnosis not present

## 2016-05-11 DIAGNOSIS — Z8249 Family history of ischemic heart disease and other diseases of the circulatory system: Secondary | ICD-10-CM | POA: Diagnosis not present

## 2016-05-11 DIAGNOSIS — M353 Polymyalgia rheumatica: Secondary | ICD-10-CM | POA: Diagnosis present

## 2016-05-11 DIAGNOSIS — R410 Disorientation, unspecified: Secondary | ICD-10-CM | POA: Diagnosis not present

## 2016-05-11 DIAGNOSIS — I5022 Chronic systolic (congestive) heart failure: Secondary | ICD-10-CM | POA: Diagnosis present

## 2016-05-11 DIAGNOSIS — Z951 Presence of aortocoronary bypass graft: Secondary | ICD-10-CM | POA: Diagnosis not present

## 2016-05-11 DIAGNOSIS — I2510 Atherosclerotic heart disease of native coronary artery without angina pectoris: Secondary | ICD-10-CM | POA: Diagnosis present

## 2016-05-11 DIAGNOSIS — G8929 Other chronic pain: Secondary | ICD-10-CM | POA: Diagnosis present

## 2016-05-11 DIAGNOSIS — G9340 Encephalopathy, unspecified: Secondary | ICD-10-CM | POA: Diagnosis present

## 2016-05-11 DIAGNOSIS — E039 Hypothyroidism, unspecified: Secondary | ICD-10-CM | POA: Diagnosis present

## 2016-05-11 DIAGNOSIS — E7800 Pure hypercholesterolemia, unspecified: Secondary | ICD-10-CM | POA: Diagnosis present

## 2016-05-11 DIAGNOSIS — N183 Chronic kidney disease, stage 3 (moderate): Secondary | ICD-10-CM | POA: Diagnosis present

## 2016-05-11 DIAGNOSIS — Z7982 Long term (current) use of aspirin: Secondary | ICD-10-CM | POA: Diagnosis not present

## 2016-05-11 DIAGNOSIS — I252 Old myocardial infarction: Secondary | ICD-10-CM | POA: Diagnosis not present

## 2016-05-11 DIAGNOSIS — Z8673 Personal history of transient ischemic attack (TIA), and cerebral infarction without residual deficits: Secondary | ICD-10-CM | POA: Diagnosis not present

## 2016-05-11 DIAGNOSIS — Z87891 Personal history of nicotine dependence: Secondary | ICD-10-CM | POA: Diagnosis not present

## 2016-05-11 DIAGNOSIS — Z79899 Other long term (current) drug therapy: Secondary | ICD-10-CM | POA: Diagnosis not present

## 2016-05-11 LAB — BASIC METABOLIC PANEL
Anion gap: 5 (ref 5–15)
BUN: 23 mg/dL — AB (ref 6–20)
CALCIUM: 8.4 mg/dL — AB (ref 8.9–10.3)
CO2: 28 mmol/L (ref 22–32)
CREATININE: 1.49 mg/dL — AB (ref 0.61–1.24)
Chloride: 105 mmol/L (ref 101–111)
GFR, EST AFRICAN AMERICAN: 45 mL/min — AB (ref >60)
GFR, EST NON AFRICAN AMERICAN: 39 mL/min — AB (ref >60)
Glucose, Bld: 126 mg/dL — ABNORMAL HIGH (ref 65–99)
Potassium: 3.8 mmol/L (ref 3.5–5.1)
SODIUM: 138 mmol/L (ref 135–145)

## 2016-05-11 LAB — CBC
HCT: 39 % (ref 39.0–52.0)
Hemoglobin: 13 g/dL (ref 13.0–17.0)
MCH: 33.9 pg (ref 26.0–34.0)
MCHC: 33.3 g/dL (ref 30.0–36.0)
MCV: 101.8 fL — AB (ref 78.0–100.0)
PLATELETS: 181 10*3/uL (ref 150–400)
RBC: 3.83 MIL/uL — AB (ref 4.22–5.81)
RDW: 15.4 % (ref 11.5–15.5)
WBC: 11.2 10*3/uL — AB (ref 4.0–10.5)

## 2016-05-11 MED ORDER — POTASSIUM CHLORIDE CRYS ER 20 MEQ PO TBCR
20.0000 meq | EXTENDED_RELEASE_TABLET | Freq: Once | ORAL | Status: AC
  Administered 2016-05-11: 20 meq via ORAL
  Filled 2016-05-11: qty 1

## 2016-05-11 MED ORDER — FUROSEMIDE 20 MG PO TABS
20.0000 mg | ORAL_TABLET | Freq: Every day | ORAL | Status: DC
  Administered 2016-05-11 – 2016-05-12 (×2): 20 mg via ORAL
  Filled 2016-05-11 (×2): qty 1

## 2016-05-11 NOTE — Progress Notes (Addendum)
PROGRESS NOTE    Barry Wright  [id number] DOB: [birth year] DOA: 05/10/2016 PCP: Mathews Argyle, MD  Brief Narrative: Patient is a 82 yo male with history of CAD, CHF, PMR, CKDIII who was brought to the ED with cc of confusion and weakness. The patient is a very poor historian and there was no family members at bedside so history was taken from ER physician. The patient has had difficulty getting into his wheelchair starting today. There is no fever/chills, chest pain/dyspnea, cough, N/V/D/C/abd pain. The patient denied any dysuria/frequency. He denied any complaints.   Assessment & Plan:   Active Problems:   UTI (lower urinary tract infection)  UTI:  Continue with  Rocephin IV, Ucx pending  Previous cultures grew Enterococcus but it was not through to be pathogenic.  WBC trending down.   Acute encephalopathy; secondary to infection . Confusion/weakness: likely due to above. PT consult before discharge.  Uses wheelchair at baseline.  Improving.   CKD stage III : cr at baseline 1.3--1.49.  Will resume lasix. Monitor renal function.   CAD: cont asp and plavix , continue statin.   PMR: cont home medications.   Chronic systolic HF: resume home dose lasix. Monitor cr.      DVT prophylaxis: lovenox Code Status: full code.  Family Communication: wife at bedside.  Disposition Plan: awaiting urine culture results. Back home .   Consultants:   none  Procedures:   none  Antimicrobials:  Ceftriaxone 7-01   Subjective: He is alert times 3. He is feeling better. He lives at home with his wife. He wants to go back home   Objective: Filed Vitals:   05/10/16 1512 05/10/16 1625 05/10/16 2035 05/11/16 0536  BP: 116/56 110/56 116/64 135/58  Pulse: 95 81 73 93  Temp:  98.3 F (36.8 C) 98.1 F (36.7 C) 97.7 F (36.5 C)  TempSrc:  Oral Oral Oral  Resp: 20 18 18 20   Height:  6' (1.829 m)    Weight:  80.8 kg (178 lb 2.1 oz)    SpO2: 98% 99% 96% 95%     Intake/Output Summary (Last 24 hours) at 05/11/16 0950 Last data filed at 05/11/16 0537  Gross per 24 hour  Intake  934.7 ml  Output    600 ml  Net  334.7 ml   Filed Weights   05/10/16 1625  Weight: 80.8 kg (178 lb 2.1 oz)    Examination:  General exam: Appears calm and comfortable  Respiratory system: Clear to auscultation. Respiratory effort normal. Cardiovascular system: S1 & S2 heard, RRR. No JVD, murmurs, rubs, gallops or clicks. No pedal edema. Gastrointestinal system: Abdomen is nondistended, soft and nontender. No organomegaly or masses felt. Normal bowel sounds heard. Central nervous system: Alert and oriented. No focal neurological deficits. Extremities: Symmetric 5 x 5 power. Skin: No rashes, lesions or ulcers Psychiatry:  Mood & affect appropriate.     Data Reviewed: I have personally reviewed following labs and imaging studies  CBC:  Recent Labs Lab 05/10/16 1130 05/11/16 0355  WBC 15.6* 11.2*  NEUTROABS 10.6*  --   HGB 14.4 13.0  HCT 43.5 39.0  MCV 102.8* 101.8*  PLT 193 0000000   Basic Metabolic Panel:  Recent Labs Lab 05/10/16 1130 05/11/16 0355  NA 139 138  K 3.4* 3.8  CL 103 105  CO2 28 28  GLUCOSE 116* 126*  BUN 24* 23*  CREATININE 1.55* 1.49*  CALCIUM 8.8* 8.4*   GFR: Estimated Creatinine Clearance: 34 mL/min (by C-G  formula based on Cr of 1.49). Liver Function Tests:  Recent Labs Lab 05/10/16 1130  AST 15  ALT 10*  ALKPHOS 65  BILITOT 0.8  PROT 6.1*  ALBUMIN 3.3*   No results for input(s): LIPASE, AMYLASE in the last 168 hours. No results for input(s): AMMONIA in the last 168 hours. Coagulation Profile: No results for input(s): INR, PROTIME in the last 168 hours. Cardiac Enzymes: No results for input(s): CKTOTAL, CKMB, CKMBINDEX, TROPONINI in the last 168 hours. BNP (last 3 results) No results for input(s): PROBNP in the last 8760 hours. HbA1C: No results for input(s): HGBA1C in the last 72 hours. CBG:  Recent  Labs Lab 05/10/16 1108  GLUCAP 103*   Lipid Profile: No results for input(s): CHOL, HDL, LDLCALC, TRIG, CHOLHDL, LDLDIRECT in the last 72 hours. Thyroid Function Tests: No results for input(s): TSH, T4TOTAL, FREET4, T3FREE, THYROIDAB in the last 72 hours. Anemia Panel: No results for input(s): VITAMINB12, FOLATE, FERRITIN, TIBC, IRON, RETICCTPCT in the last 72 hours. Sepsis Labs:  Recent Labs Lab 05/10/16 1140  LATICACIDVEN 1.23    Recent Results (from the past 240 hour(s))  Blood Culture (routine x 2)     Status: None (Preliminary result)   Collection Time: 05/10/16 11:30 AM  Result Value Ref Range Status   Specimen Description BLOOD LEFT ARM  2 ML IN YELLOW ONLY  Final   Special Requests   Final    IN PEDIATRIC BOTTLE 2CC Performed at [facility]    Culture PENDING  Incomplete   Report Status PENDING  Incomplete  Blood Culture (routine x 2)     Status: None (Preliminary result)   Collection Time: 05/10/16 12:45 PM  Result Value Ref Range Status   Specimen Description BLOOD RIGHT ARM  Final   Special Requests   Final    BOTTLES DRAWN AEROBIC AND ANAEROBIC 8CC Performed at [facility]    Culture PENDING  Incomplete   Report Status PENDING  Incomplete         Radiology Studies: Dg Chest 2 View  05/10/2016  CLINICAL DATA:  Sepsis EXAM: CHEST  2 VIEW COMPARISON:  09/26/15 chest radiograph. FINDINGS: Sternotomy wires appear aligned and intact. CABG clips overlie the mediastinum Stable cardiomediastinal silhouette with mild cardiomegaly. No pneumothorax. Trace bilateral pleural effusions. Mild pulmonary edema. Minimal hazy opacities in the lower lobes. IMPRESSION: 1. Mild congestive heart failure . 2. Trace bilateral pleural effusions. 3. Minimal hazy bilateral lower lobe opacities, favor atelectasis and/or edema. Electronically Signed   By: Ilona Sorrel M.D.   On: 05/10/2016 11:28   Ct Head Wo Contrast  05/10/2016  CLINICAL DATA:  Sepsis.  History stroke.   Weakness. EXAM: CT HEAD WITHOUT CONTRAST TECHNIQUE: Contiguous axial images were obtained from the base of the skull through the vertex without intravenous contrast. COMPARISON:  CT head 02/21/2016 FINDINGS: Moderate to advanced atrophy. Chronic microvascular ischemic changes in the white matter are stable. Negative for acute infarct.  Negative for acute hemorrhage or mass. Negative calvarium. Air-fluid level sphenoid sinus which has improved in the interval. No worrisome skull lesion. IMPRESSION: Atrophy and chronic ischemic change. No acute intracranial abnormality Improvement in air-fluid level sphenoid sinus. Electronically Signed   By: Franchot Gallo M.D.   On: 05/10/2016 11:45        Scheduled Meds: . acetaminophen  1,000 mg Oral BID  . antiseptic oral rinse  7 mL Mouth Rinse BID  . aspirin EC  81 mg Oral Daily  . calcium  carbonate  1 tablet Oral Q breakfast  . cefTRIAXone (ROCEPHIN)  IV  1 g Intravenous Q24H  . clopidogrel  75 mg Oral Daily  . enoxaparin (LOVENOX) injection  40 mg Subcutaneous Q24H  . feeding supplement (ENSURE ENLIVE)  237 mL Oral BID BM  . folic acid  1 mg Oral Daily  . gabapentin  200 mg Oral TID  . levothyroxine  100 mcg Oral QAC breakfast  . multivitamin with minerals  1 tablet Oral Daily  . predniSONE  5 mg Oral Daily   Continuous Infusions: . dextrose 5 % and 0.45% NaCl 75 mL/hr at 05/11/16 0559        Time spent: 35 minutes     Elmarie Shiley, MD [name] Pager [phone]  If 7PM-7AM, please contact night-coverage [email or web address] Password [email or web address] 05/11/2016, 9:50 AM

## 2016-05-12 DIAGNOSIS — M353 Polymyalgia rheumatica: Secondary | ICD-10-CM

## 2016-05-12 DIAGNOSIS — N183 Chronic kidney disease, stage 3 (moderate): Secondary | ICD-10-CM

## 2016-05-12 DIAGNOSIS — E039 Hypothyroidism, unspecified: Secondary | ICD-10-CM

## 2016-05-12 DIAGNOSIS — G459 Transient cerebral ischemic attack, unspecified: Secondary | ICD-10-CM

## 2016-05-12 DIAGNOSIS — N390 Urinary tract infection, site not specified: Principal | ICD-10-CM

## 2016-05-12 LAB — BASIC METABOLIC PANEL
ANION GAP: 7 (ref 5–15)
BUN: 26 mg/dL — ABNORMAL HIGH (ref 6–20)
CALCIUM: 8.9 mg/dL (ref 8.9–10.3)
CO2: 27 mmol/L (ref 22–32)
Chloride: 108 mmol/L (ref 101–111)
Creatinine, Ser: 1.32 mg/dL — ABNORMAL HIGH (ref 0.61–1.24)
GFR, EST AFRICAN AMERICAN: 52 mL/min — AB (ref >60)
GFR, EST NON AFRICAN AMERICAN: 45 mL/min — AB (ref >60)
GLUCOSE: 130 mg/dL — AB (ref 65–99)
POTASSIUM: 4.2 mmol/L (ref 3.5–5.1)
SODIUM: 142 mmol/L (ref 135–145)

## 2016-05-12 LAB — CBC
HCT: 40.9 % (ref 39.0–52.0)
Hemoglobin: 13.4 g/dL (ref 13.0–17.0)
MCH: 33.4 pg (ref 26.0–34.0)
MCHC: 32.8 g/dL (ref 30.0–36.0)
MCV: 102 fL — AB (ref 78.0–100.0)
PLATELETS: 188 10*3/uL (ref 150–400)
RBC: 4.01 MIL/uL — AB (ref 4.22–5.81)
RDW: 15.3 % (ref 11.5–15.5)
WBC: 9.8 10*3/uL (ref 4.0–10.5)

## 2016-05-12 MED ORDER — CEPHALEXIN 500 MG PO CAPS: 500.0000 mg | ORAL_CAPSULE | Freq: Two times a day (BID) | ORAL | Status: DC

## 2016-05-12 MED ORDER — ENSURE ENLIVE PO LIQD: 237.0000 mL | Freq: Two times a day (BID) | ORAL | Status: AC

## 2016-05-12 NOTE — Discharge Summary (Signed)
Barry Wright, is a 82 y.o. male  DOB July 11, 1920  MRN [id number].  Admission date:  05/10/2016  Admitting Physician  Gennaro Africa, MD  Discharge Date:  05/12/2016   Primary MD  Mathews Argyle, MD  Recommendations for primary care physician for things to follow:   Uti: pt has had 2 days of rocephin and will continue on keflex for 5 more days.   CKD stage 3:  Lowered dose of lasix to 10mg  po qday, please check bmp in 1-2 weeks to ensure stability of creatinine  PMR Cont prednisone May resume methotrexate Check cbc in 1-2 weeks  Hypothyroidism Cont levothyroxine  TIA Cont plavix   Chronic back pain/ deconditioning Pt states he is already receiving outpatient PT and will continue Cont gabapentin and tramadol for pain   Admission Diagnosis  UTI (lower urinary tract infection) [N39.0]   Discharge Diagnosis  UTI (lower urinary tract infection) [N39.0]    Active Problems:   Transient cerebral ischemia   Chronic kidney disease (CKD), stage III (moderate)   Polymyalgia rheumatica (HCC)   Hypothyroidism   UTI (lower urinary tract infection)   UTI (urinary tract infection)      Past Medical History  Diagnosis Date  . Chronic back pain   . Hypercholesteremia   . Hypothyroidism   . CAD (coronary artery disease)   . Chronic kidney disease (CKD), stage III (moderate)   . Polymyalgia rheumatica ([place])   . Left bundle branch block   . Hx of CABG   . History of nephrectomy   . Internal carotid artery stenosis   . Myocardial infarction ([place])   . CHF (congestive heart failure) ([place])   . Stroke [facility])     Past Surgical History  Procedure Laterality Date  . Coronary artery bypass graft    . Cholecystectomy    . Kidney surgery      kidney removed       HPI  from the history and physical done on the day of admission:    82 yo male with history of CAD, CHF, PMR, CKDIII who was  brought to the ED with cc of confusion and weakness. The patient is a very poor historian and there was no family members at bedside so history was taken from ER physician. The patient has had difficulty getting into his wheelchair starting today. There is no fever/chills, chest pain/dyspnea, cough, N/V/D/C/abd pain. The patient denied any dysuria/frequency. He denied any complaints.      Hospital Course:        Pt was started on rocephin and improved in terms of his confusion,  He is at baseline. Pt continues to have weakness due to deconditioning and chronic back pain.  Patient states that he has outpatient therapy already in place. Pt has been afebrile.  Wbc  15.6=> 9.8   His renal insufficiency has remained stable.  Creatinine 1.32 (05/12/2016)  Methotrexate was held, and can be resumed.  Pt appears stable and excited about going home today.  Follow UP  Follow-up Information    Follow up with Mathews Argyle, MD. Schedule an appointment as soon as possible for a visit in 1 week.   Specialty:  Internal Medicine   Contact information:   [address]  [place] [address] [phone]        Consults obtained -  none  Discharge Condition: stable  Diet and Activity recommendation: See Discharge Instructions below  Discharge Instructions    Please take keflex 500mg  po bid x 5 days for uti.  Please f/u with Dr. Felipa Eth in 1-2 weeks for cbc, cmp     Discharge Medications       Medication List    TAKE these medications        acetaminophen 500 MG tablet  Commonly known as:  TYLENOL  Take 1,000 mg by mouth 2 (two) times daily.     aspirin 81 MG tablet  Take 81 mg by mouth daily.     calcium carbonate 1250 (500 Ca) MG tablet  Commonly known as:  OS-CAL - dosed in mg of elemental calcium  Take 1 tablet by mouth daily with breakfast.     cephALEXin 500 MG capsule  Commonly known as:  KEFLEX  Take 1 capsule (500 mg total) by mouth 2 (two)  times daily.     clopidogrel 75 MG tablet  Commonly known as:  PLAVIX  Take 75 mg by mouth daily.     feeding supplement (ENSURE ENLIVE) Liqd  Take 237 mLs by mouth 2 (two) times daily between meals.     folic acid 1 MG tablet  Commonly known as:  FOLVITE  Take 1 mg by mouth daily.     furosemide 20 MG tablet  Commonly known as:  LASIX  Take 0.5 tablets (10 mg total) by mouth daily.     gabapentin 100 MG capsule  Commonly known as:  NEURONTIN  Take 200 mg by mouth 3 (three) times daily. Per note: Takes 2 capsules three times/day.     methotrexate 2.5 MG tablet  Commonly known as:  RHEUMATREX  Take 10 mg by mouth every Wednesday.     multivitamins ther. w/minerals Tabs tablet  Take 1 tablet by mouth daily.     omeprazole 20 MG capsule  Commonly known as:  PRILOSEC  Take 20 mg by mouth daily.     polyethylene glycol packet  Commonly known as:  MIRALAX / GLYCOLAX  Take 17 g by mouth daily as needed for mild constipation.     predniSONE 5 MG tablet  Commonly known as:  DELTASONE  Take 5 mg by mouth daily.     SYNTHROID 100 MCG tablet  Generic drug:  levothyroxine  Take 100 mcg by mouth daily before breakfast.     traMADol 50 MG tablet  Commonly known as:  ULTRAM  Take one tablet by mouth twice daily for pains PRN        Major procedures and Radiology Reports - PLEASE review detailed and final reports for all details, in brief -      Dg Chest 2 View  05/10/2016  CLINICAL DATA:  Sepsis EXAM: CHEST  2 VIEW COMPARISON:  09/26/15 chest radiograph. FINDINGS: Sternotomy wires appear aligned and intact. CABG clips overlie the mediastinum Stable cardiomediastinal silhouette with mild cardiomegaly. No pneumothorax. Trace bilateral pleural effusions. Mild pulmonary edema. Minimal hazy opacities in the lower lobes. IMPRESSION: 1. Mild congestive heart failure . 2. Trace bilateral pleural effusions. 3. Minimal hazy bilateral  lower lobe opacities, favor atelectasis and/or edema.  Electronically Signed   By: Ilona Sorrel M.D.   On: 05/10/2016 11:28   Ct Head Wo Contrast  05/10/2016  CLINICAL DATA:  Sepsis.  History stroke.  Weakness. EXAM: CT HEAD WITHOUT CONTRAST TECHNIQUE: Contiguous axial images were obtained from the base of the skull through the vertex without intravenous contrast. COMPARISON:  CT head 02/21/2016 FINDINGS: Moderate to advanced atrophy. Chronic microvascular ischemic changes in the white matter are stable. Negative for acute infarct.  Negative for acute hemorrhage or mass. Negative calvarium. Air-fluid level sphenoid sinus which has improved in the interval. No worrisome skull lesion. IMPRESSION: Atrophy and chronic ischemic change. No acute intracranial abnormality Improvement in air-fluid level sphenoid sinus. Electronically Signed   By: Franchot Gallo M.D.   On: 05/10/2016 11:45    Micro Results     Recent Results (from the past 240 hour(s))  Blood Culture (routine x 2)     Status: None (Preliminary result)   Collection Time: 05/10/16 11:30 AM  Result Value Ref Range Status   Specimen Description BLOOD LEFT ARM  2 ML IN YELLOW ONLY  Final   Special Requests IN PEDIATRIC BOTTLE 2CC  Final   Culture   Final    NO GROWTH 1 DAY Performed at [facility]    Report Status PENDING  Incomplete  Urine culture     Status: Abnormal (Preliminary result)   Collection Time: 05/10/16 12:30 PM  Result Value Ref Range Status   Specimen Description URINE, CATHETERIZED  Final   Special Requests NONE  Final   Culture >=100,000 COLONIES/mL GRAM NEGATIVE RODS (A)  Final   Report Status PENDING  Incomplete  Blood Culture (routine x 2)     Status: None (Preliminary result)   Collection Time: 05/10/16 12:45 PM  Result Value Ref Range Status   Specimen Description BLOOD RIGHT ARM  Final   Special Requests BOTTLES DRAWN AEROBIC AND ANAEROBIC 8CC  Final   Culture   Final    NO GROWTH 1 DAY Performed at [facility]    Report Status PENDING   Incomplete       Today   Subjective    Barry Wright today has no headache,no chest abdominal pain,no new weakness tingling or numbness, feels much better wants to go home today.    Objective   Blood pressure 146/71, pulse 73, temperature 98 F (36.7 C), temperature source Oral, resp. rate 16, height 6' (1.829 m), weight 80.8 kg (178 lb 2.1 oz), SpO2 97 %.   Intake/Output Summary (Last 24 hours) at 05/12/16 0922 Last data filed at 05/12/16 0533  Gross per 24 hour  Intake    770 ml  Output   1500 ml  Net   -730 ml    Exam Awake Alert, Oriented x 3, No new F.N deficits, Normal affect [place].AT,PERRAL Supple Neck,No JVD, No cervical lymphadenopathy appriciated.  Symmetrical Chest wall movement, Good air movement bilaterally, CTAB RRR,s1, s2, 1/6 sem rusb No Parasternal Heave +ve B.Sounds, Abd Soft, Non tender, No organomegaly appriciated, No rebound -guarding or rigidity. No Cyanosis, Clubbing or edema, No new Rash or bruise No cva tenderness    Data Review   CBC w Diff: Lab Results  Component Value Date   WBC 9.8 05/12/2016   HGB 13.4 05/12/2016   HCT 40.9 05/12/2016   PLT 188 05/12/2016   LYMPHOPCT 22 05/10/2016   MONOPCT 9 05/10/2016   EOSPCT 2 05/10/2016   BASOPCT 0  05/10/2016    CMP: Lab Results  Component Value Date   NA 142 05/12/2016   K 4.2 05/12/2016   CL 108 05/12/2016   CO2 27 05/12/2016   BUN 26* 05/12/2016   CREATININE 1.32* 05/12/2016   PROT 6.1* 05/10/2016   ALBUMIN 3.3* 05/10/2016   BILITOT 0.8 05/10/2016   ALKPHOS 65 05/10/2016   AST 15 05/10/2016   ALT 10* 05/10/2016  .   Total Time in preparing paper work, data evaluation and todays exam - 9 minutes  Jani Gravel M.D on 05/12/2016 at [facility]   Office  [phone]

## 2016-05-12 NOTE — Progress Notes (Signed)
Pt discharged home with spouse and caregiver in stable condition.  Discharge instructions and scripts given. Pt and caregiver verbalized understanding

## 2016-05-12 NOTE — Progress Notes (Signed)
PT Cancellation Note  Patient Details Name: Barry Wright MRN: [id number] DOB: Apr 01, 1920   Cancelled Treatment:    Reason Eval/Treat Not Completed: Spoke with pt, wife, and aide. Pt set to d/c home today. He states he would like to resume OP PT (pt was working with them prior to hospital admission). Encouraged pt/caregivers to contact PT office to see when he could be scheduled again. Pt plans to return home with continued assistance of aides.    Weston Anna, MPT Pager: [phone]

## 2016-05-12 NOTE — Discharge Instructions (Signed)
Please make sure to follow up with Dr. Felipa Eth in 1 week for follow up of your urinary tract infection, as well as kidney function

## 2016-05-13 LAB — URINE CULTURE

## 2016-05-15 LAB — CULTURE, BLOOD (ROUTINE X 2)
CULTURE: NO GROWTH
Culture: NO GROWTH

## 2016-05-19 ENCOUNTER — Ambulatory Visit: Payer: Medicare Other | Attending: Geriatric Medicine

## 2016-05-19 DIAGNOSIS — M6281 Muscle weakness (generalized): Secondary | ICD-10-CM | POA: Diagnosis not present

## 2016-05-19 DIAGNOSIS — R262 Difficulty in walking, not elsewhere classified: Secondary | ICD-10-CM | POA: Diagnosis not present

## 2016-05-19 NOTE — Therapy (Addendum)
[place], [place], [address] Phone: [phone]   Fax:  [phone]  Physical Therapy Treatment  Patient Details  Name: Barry Wright MRN: [id number] Date of Birth: 21-Oct-1920 Referring Provider: Lajean Manes, MD  Encounter Date: 05/19/2016      PT End of Session - 05/19/16 1111    Visit Number 31   Number of Visits 35   Date for PT Re-Evaluation 06/08/16   Authorization Type KX modifier   PT Start Time 1115   PT Stop Time 1200   PT Time Calculation (min) 45 min   Activity Tolerance Patient tolerated treatment well;Patient limited by fatigue   Behavior During Therapy [facility] for tasks assessed/performed      Past Medical History  Diagnosis Date  . Chronic back pain   . Hypercholesteremia   . Hypothyroidism   . CAD (coronary artery disease)   . Chronic kidney disease (CKD), stage III (moderate)   . Polymyalgia rheumatica ([place])   . Left bundle branch block   . Hx of CABG   . History of nephrectomy   . Internal carotid artery stenosis   . Myocardial infarction ([place])   . CHF (congestive heart failure) ([place])   . Stroke [facility])     Past Surgical History  Procedure Laterality Date  . Coronary artery bypass graft    . Cholecystectomy    . Kidney surgery      kidney removed    There were no vitals filed for this visit.      Subjective Assessment - 05/19/16 1129    Subjective Feeling tired but OK. had not been here due to care giver issues and hosp admission for UTI earlier this month.    Patient is accompained by: Family member  spouse and caregiver   Currently in Pain? No/denies            [facility] PT Assessment - 05/19/16 1149    Strength   Right Hip Flexion 3+/5   Right Hip Extension 2+/5   Right Hip External Rotation  3+/5   Right Hip Internal Rotation 3+/5   Right Hip ABduction 2/5   Right Hip ADduction 4-/5   Left Hip Flexion 3+/5   Left Hip Extension 2+/5   Left Hip  External Rotation 3+/5   Left Hip Internal Rotation 3-/5   Left Hip ABduction 2/5   Left Hip ADduction 4-/5   Right Knee Flexion 4/5   Right Knee Extension 3+/5   Left Knee Flexion 4/5   Left Knee Extension 4-/5   Right Ankle Dorsiflexion 4/5   Left Ankle Dorsiflexion 4/5   Transfers   Sit to Stand Not tested (comment)   Five time sit to stand comments  Unable to do without resting for 30-60 sec after 2-3 reps   Comments mod to max asssit to transfer from chair to mat or nustep   Ambulation/Gait   Gait Comments unable to walk                     [facility] Adult PT Treatment/Exercise - 05/19/16 1149    Ambulation/Gait   Pre-Gait Activities stood x 3 for les than 5 sec then attempted to walk x 3 and only once he was able to advance each foot only 4-6 inches then stood to march each leg x 1 2reps with verbal cues   Lumbar Exercises: Aerobic   Stationary Bike Nustep L4 6 min LE only and verbal cues to  contniue without stopping verbal cues multiple times to have him not stop with legs    Lumbar Exercises: Seated   Long Arc Quad on Chair Right;Left;20 reps   LAQ on Chair Limitations cued verbally and with target to max knee extension.    Other Seated Lumbar Exercises hip abduction and adduction with verbal anc tactile cues for max ROm active nad added some light resistance. x 25 reps     He needed much cueing to max effort for all activity             PT Short Term Goals - 05/19/16 1112    PT SHORT TERM GOAL #1   Title pt will be independent with intial HEP after review   Status Achieved   PT SHORT TERM GOAL #2   Title He will be able to walk in bars 100 feet or more with turning with only contact guard.   Status Deferred   PT SHORT TERM GOAL #3   Title He will be able to roll on mat RT and LT  without asssitance   Status Deferred   PT SHORT TERM GOAL #4   Title He will be able to go sit to stand with min assist to walker   Status On-going           PT  Long Term Goals - 05/19/16 1113    PT LONG TERM GOAL #1   Title pt will perform sit to stand without assistance at his WC to walker   Status On-going   PT LONG TERM GOAL #2   Title pt will ambulate x 30 ft with RW CGA   Status On-going   PT LONG TERM GOAL #3   Title pt will tolerate standing activities in the clinic x 5 minutes to demonstrate improved endurance   Status Deferred   PT LONG TERM GOAL #4   Title patient able to walk 50 feet with rolling walker and c.g with aide from W/C   Status On-going   PT LONG TERM GOAL #5   Title He will be able to transfer  with RW and pivot fully before sitting with min to no verbal cues.   Status On-going   PT LONG TERM GOAL #6   Title He will be able to transfer with pivot correctly to chair after walking 50 feet with walker. after turning to walk back at 50 feet.    Status On-going               Plan - 05/19/16 1112    Clinical Impression Statement Barry Wright after another bout of UTI has regressed and is not able to walk again. As he hs had a change in status from hospitilization so I need to see if he can make preofress so I will continue x 4-6 treatments and most likly discharge . Barry Wright and Barry Wright were informed of this.    Rehab Potential Fair   PT Frequency 2x / week   PT Duration 3 weeks   PT Treatment/Interventions Gait training;Functional mobility training;Therapeutic exercise;Patient/family education;Passive range of motion   PT Next Visit Plan Cointinue to ty to walk with RW and return to mat for exercise  Mat exercise next visit. Push walking distance if doing well    FOTO next visit   PT Home Exercise Plan Continue to see of can progress with walking an dcontinue Nustep and exercises   Consulted and Agree with Plan of Care Patient   Family Member  Consulted spouse      Patient will benefit from skilled therapeutic intervention in order to improve the following deficits and impairments:  Abnormal gait, Difficulty walking,  Decreased strength, Decreased range of motion, Decreased activity tolerance  Visit Diagnosis: Muscle weakness (generalized) - Plan: PT plan of care cert/re-cert  Difficulty in walking, not elsewhere classified - Plan: PT plan of care cert/re-cert     Problem List Patient Active Problem List   Diagnosis Date Noted  . UTI (urinary tract infection) 05/11/2016  . UTI (lower urinary tract infection) 02/21/2016  . AKI (acute kidney injury) ([place]) 02/21/2016  . Pressure ulcer 09/29/2015  . Altered mental status   . Acute encephalopathy 09/26/2015  . Chronic back pain 09/26/2015  . Generalized weakness 04/13/2015  . Lobar pneumonia due to unspecified organism 04/13/2015  . Prolonged QT interval   . Weakness 04/11/2015  . CAP (community acquired pneumonia) 01/25/2015  . Dehydration 01/25/2015  . Chronic diastolic heart failure ([place]) 06/19/2014  . Essential hypertension, benign 03/08/2014  . Hypothyroidism 03/08/2014  . Protein-calorie malnutrition, moderate ([place]) 03/08/2014  . Constipation 03/08/2014  . Physical deconditioning 03/08/2014  . Hypokalemia 02/27/2014  . Abnormal serum protein electrophoresis 01/07/2013  . Chronic kidney disease (CKD), stage III (moderate) 01/03/2013  . Polymyalgia rheumatica ([place]) 01/03/2013  . Osteopenia 01/03/2013  . Carotid artery disease ([place]) 01/03/2013  . Spinal stenosis of lumbar region 01/03/2013  . Coronary artery disease 01/03/2013  . Old MI (myocardial infarction) 01/03/2013  . Transient cerebral ischemia 11/18/2011    [name]  PT 05/19/2016, 12:14 PM  [facility] [address] [place], [place], [address] Phone: [phone]   Fax:  [phone]  Name: DWON SKY MRN: [id number] Date of Birth: [birth year]    PHYSICAL THERAPY DISCHARGE SUMMARY  Visits from Start of Care: 31   Current functional level related to goals / functional outcomes: Barry Wright canceled his  scheduled appointments and the message was he was to receive HHPT  Remaining deficits: See Above.      Education / Equipment: HEP  Plan: Patient agrees to discharge.  Patient goals were not met. Patient is being discharged due to a change in medical status.  ?????   After his episode with HHPT, unless he is walking short distances in home at time of his discharge from [facility], it is probably not appropriate for Barry Wright to come to OPPT.  He has been seen here for 31 visits since 10/2015 and though he was able at one point to walk 60 feet he never was able to walk that far since then and has had a rather steady decline since that time. About this time last year he was in home health and in August started at another clinic until November and was not able to walk at home after that episode of care . A month later he started at our clinic  .   When he was able to walk longer distances he was  not able to turn independently with walker to sit in  chair which made for more physical help needed than most of the caregivers they employ feel comfortable doing.  Also not all of his caregivers were willing to walk him even when he was able to walk short distances. Hopefully he will make gains at home but I feel he is in decline and will not make any significant gains with PT in future.  [name], PT      05/27/16             2:42 PM     .

## 2016-05-26 ENCOUNTER — Encounter: Payer: Medicare Other | Admitting: Physical Therapy

## 2016-05-26 DIAGNOSIS — N183 Chronic kidney disease, stage 3 (moderate): Secondary | ICD-10-CM | POA: Diagnosis not present

## 2016-05-26 DIAGNOSIS — N3000 Acute cystitis without hematuria: Secondary | ICD-10-CM | POA: Diagnosis not present

## 2016-05-26 DIAGNOSIS — I129 Hypertensive chronic kidney disease with stage 1 through stage 4 chronic kidney disease, or unspecified chronic kidney disease: Secondary | ICD-10-CM | POA: Diagnosis not present

## 2016-05-26 DIAGNOSIS — R269 Unspecified abnormalities of gait and mobility: Secondary | ICD-10-CM | POA: Diagnosis not present

## 2016-05-27 ENCOUNTER — Encounter: Payer: Medicare Other | Admitting: Physical Therapy

## 2016-05-28 DIAGNOSIS — M4806 Spinal stenosis, lumbar region: Secondary | ICD-10-CM | POA: Diagnosis not present

## 2016-05-28 DIAGNOSIS — Z950 Presence of cardiac pacemaker: Secondary | ICD-10-CM | POA: Diagnosis not present

## 2016-05-28 DIAGNOSIS — I5032 Chronic diastolic (congestive) heart failure: Secondary | ICD-10-CM | POA: Diagnosis not present

## 2016-05-28 DIAGNOSIS — N183 Chronic kidney disease, stage 3 (moderate): Secondary | ICD-10-CM | POA: Diagnosis not present

## 2016-05-28 DIAGNOSIS — I2510 Atherosclerotic heart disease of native coronary artery without angina pectoris: Secondary | ICD-10-CM | POA: Diagnosis not present

## 2016-05-28 DIAGNOSIS — M0609 Rheumatoid arthritis without rheumatoid factor, multiple sites: Secondary | ICD-10-CM | POA: Diagnosis not present

## 2016-05-28 DIAGNOSIS — I130 Hypertensive heart and chronic kidney disease with heart failure and stage 1 through stage 4 chronic kidney disease, or unspecified chronic kidney disease: Secondary | ICD-10-CM | POA: Diagnosis not present

## 2016-05-28 DIAGNOSIS — I252 Old myocardial infarction: Secondary | ICD-10-CM | POA: Diagnosis not present

## 2016-05-28 DIAGNOSIS — Z87440 Personal history of urinary (tract) infections: Secondary | ICD-10-CM | POA: Diagnosis not present

## 2016-05-28 DIAGNOSIS — M353 Polymyalgia rheumatica: Secondary | ICD-10-CM | POA: Diagnosis not present

## 2016-05-30 DIAGNOSIS — M353 Polymyalgia rheumatica: Secondary | ICD-10-CM | POA: Diagnosis not present

## 2016-05-30 DIAGNOSIS — N183 Chronic kidney disease, stage 3 (moderate): Secondary | ICD-10-CM | POA: Diagnosis not present

## 2016-05-30 DIAGNOSIS — M4806 Spinal stenosis, lumbar region: Secondary | ICD-10-CM | POA: Diagnosis not present

## 2016-05-30 DIAGNOSIS — M0609 Rheumatoid arthritis without rheumatoid factor, multiple sites: Secondary | ICD-10-CM | POA: Diagnosis not present

## 2016-05-30 DIAGNOSIS — I130 Hypertensive heart and chronic kidney disease with heart failure and stage 1 through stage 4 chronic kidney disease, or unspecified chronic kidney disease: Secondary | ICD-10-CM | POA: Diagnosis not present

## 2016-05-30 DIAGNOSIS — I5032 Chronic diastolic (congestive) heart failure: Secondary | ICD-10-CM | POA: Diagnosis not present

## 2016-05-31 IMAGING — CR DG CHEST 2V
2 series · 2 of 2 positions shown · non-contrast
Comparison: 12/26/2014

CLINICAL DATA: Initial evaluation for fever for 2 days

EXAM:
CHEST  2 VIEW

[chest lat]
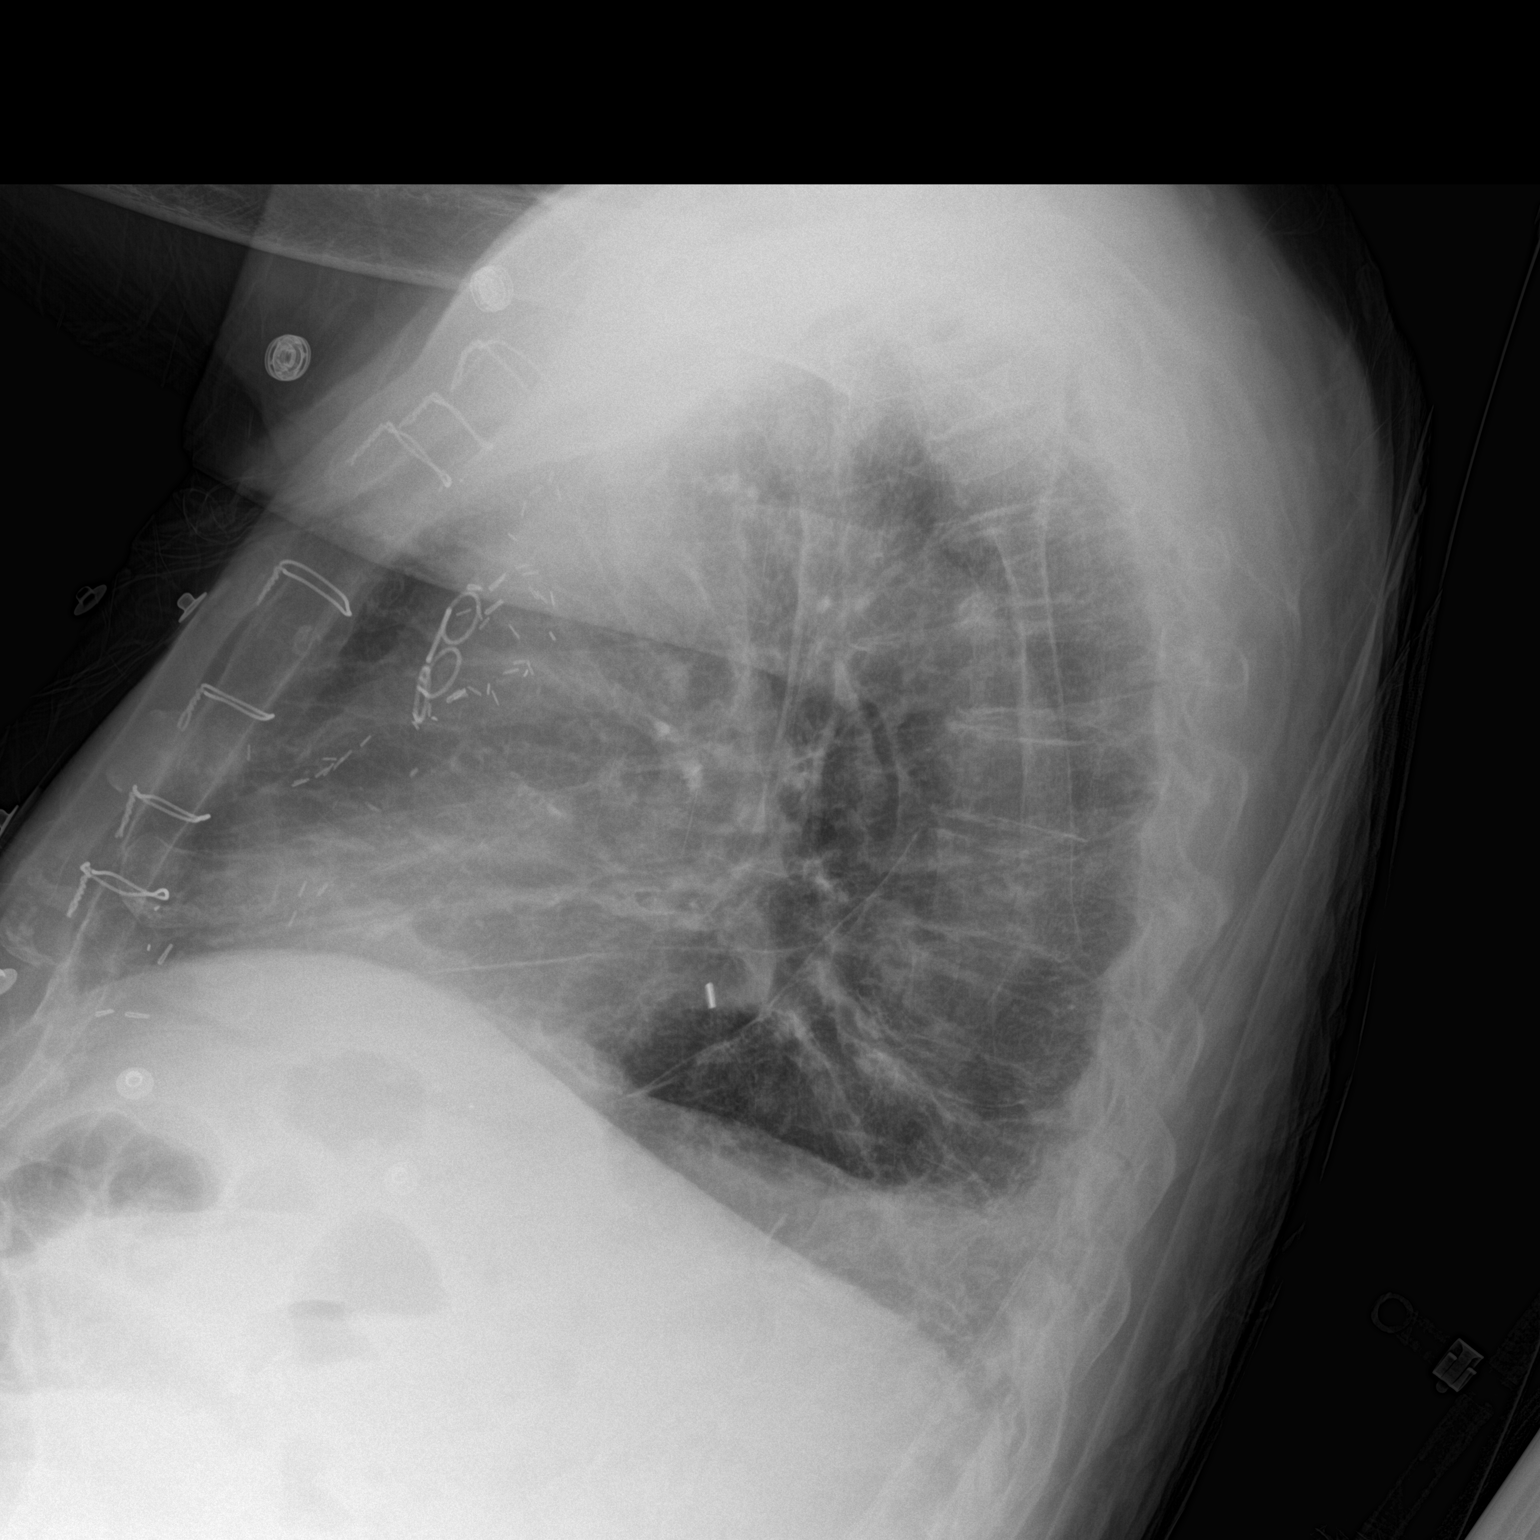

[chest ap]
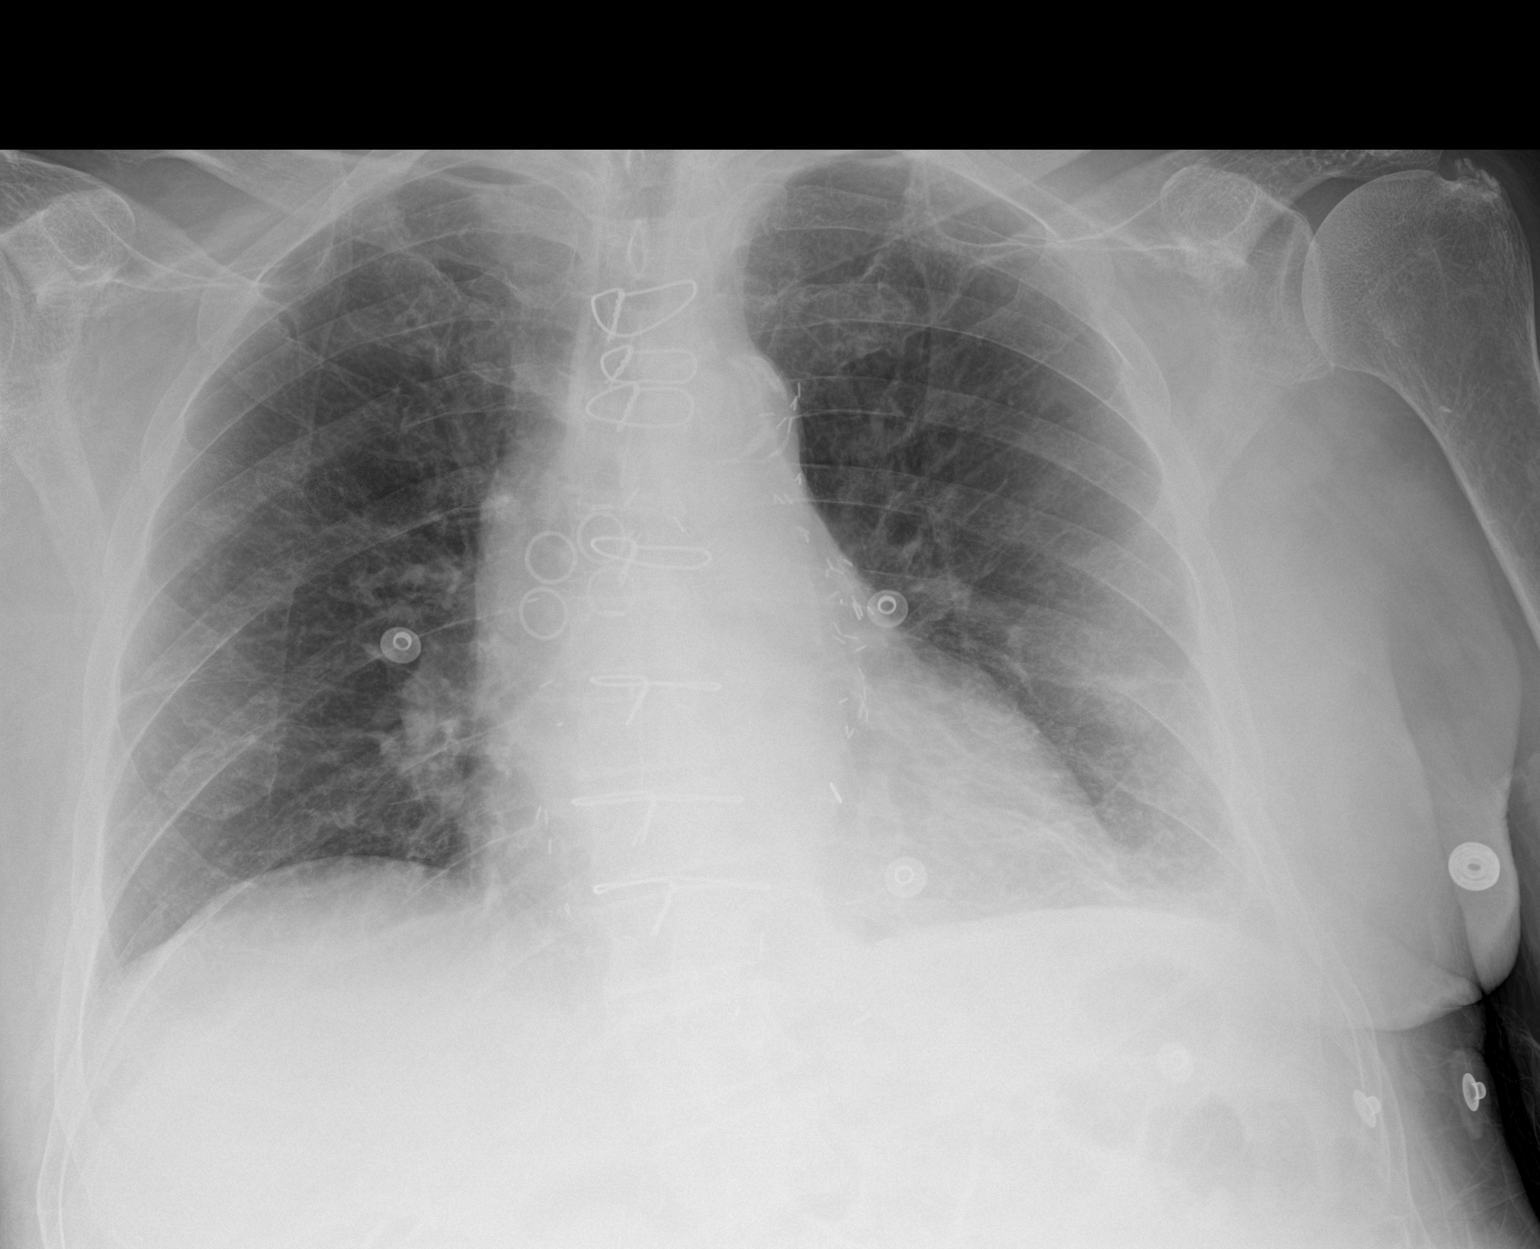

[2 of 2 positions shown; findings below may reference images not displayed]

FINDINGS: Mild to moderate cardiac enlargement status post CABG, unchanged.
Vascular pattern normal. No right-sided infiltrate or effusion. 2 cm
nodular opacity over the left lower lobe. Mildly increased left
lower lobe markings. Blunting left costophrenic angle.
IMPRESSION: Findings suggest left lower lobe infiltrate with small associated
pleural effusion concerning for pneumonia. There is also a nodular
density over the left lower lobe for which CT thorax is suggested.

## 2016-06-02 DIAGNOSIS — I130 Hypertensive heart and chronic kidney disease with heart failure and stage 1 through stage 4 chronic kidney disease, or unspecified chronic kidney disease: Secondary | ICD-10-CM | POA: Diagnosis not present

## 2016-06-02 DIAGNOSIS — I5032 Chronic diastolic (congestive) heart failure: Secondary | ICD-10-CM | POA: Diagnosis not present

## 2016-06-02 DIAGNOSIS — M4806 Spinal stenosis, lumbar region: Secondary | ICD-10-CM | POA: Diagnosis not present

## 2016-06-02 DIAGNOSIS — M0609 Rheumatoid arthritis without rheumatoid factor, multiple sites: Secondary | ICD-10-CM | POA: Diagnosis not present

## 2016-06-02 DIAGNOSIS — N183 Chronic kidney disease, stage 3 (moderate): Secondary | ICD-10-CM | POA: Diagnosis not present

## 2016-06-02 DIAGNOSIS — M353 Polymyalgia rheumatica: Secondary | ICD-10-CM | POA: Diagnosis not present

## 2016-06-03 DIAGNOSIS — N390 Urinary tract infection, site not specified: Secondary | ICD-10-CM | POA: Diagnosis not present

## 2016-06-04 DIAGNOSIS — M4806 Spinal stenosis, lumbar region: Secondary | ICD-10-CM | POA: Diagnosis not present

## 2016-06-04 DIAGNOSIS — M353 Polymyalgia rheumatica: Secondary | ICD-10-CM | POA: Diagnosis not present

## 2016-06-04 DIAGNOSIS — I130 Hypertensive heart and chronic kidney disease with heart failure and stage 1 through stage 4 chronic kidney disease, or unspecified chronic kidney disease: Secondary | ICD-10-CM | POA: Diagnosis not present

## 2016-06-04 DIAGNOSIS — I5032 Chronic diastolic (congestive) heart failure: Secondary | ICD-10-CM | POA: Diagnosis not present

## 2016-06-04 DIAGNOSIS — N183 Chronic kidney disease, stage 3 (moderate): Secondary | ICD-10-CM | POA: Diagnosis not present

## 2016-06-04 DIAGNOSIS — M0609 Rheumatoid arthritis without rheumatoid factor, multiple sites: Secondary | ICD-10-CM | POA: Diagnosis not present

## 2016-06-05 DIAGNOSIS — N390 Urinary tract infection, site not specified: Secondary | ICD-10-CM | POA: Diagnosis not present

## 2016-06-06 DIAGNOSIS — M0609 Rheumatoid arthritis without rheumatoid factor, multiple sites: Secondary | ICD-10-CM | POA: Diagnosis not present

## 2016-06-06 DIAGNOSIS — N183 Chronic kidney disease, stage 3 (moderate): Secondary | ICD-10-CM | POA: Diagnosis not present

## 2016-06-06 DIAGNOSIS — M353 Polymyalgia rheumatica: Secondary | ICD-10-CM | POA: Diagnosis not present

## 2016-06-06 DIAGNOSIS — I130 Hypertensive heart and chronic kidney disease with heart failure and stage 1 through stage 4 chronic kidney disease, or unspecified chronic kidney disease: Secondary | ICD-10-CM | POA: Diagnosis not present

## 2016-06-06 DIAGNOSIS — I5032 Chronic diastolic (congestive) heart failure: Secondary | ICD-10-CM | POA: Diagnosis not present

## 2016-06-06 DIAGNOSIS — M4806 Spinal stenosis, lumbar region: Secondary | ICD-10-CM | POA: Diagnosis not present

## 2016-06-10 DIAGNOSIS — M0609 Rheumatoid arthritis without rheumatoid factor, multiple sites: Secondary | ICD-10-CM | POA: Diagnosis not present

## 2016-06-10 DIAGNOSIS — I130 Hypertensive heart and chronic kidney disease with heart failure and stage 1 through stage 4 chronic kidney disease, or unspecified chronic kidney disease: Secondary | ICD-10-CM | POA: Diagnosis not present

## 2016-06-10 DIAGNOSIS — I5032 Chronic diastolic (congestive) heart failure: Secondary | ICD-10-CM | POA: Diagnosis not present

## 2016-06-10 DIAGNOSIS — M353 Polymyalgia rheumatica: Secondary | ICD-10-CM | POA: Diagnosis not present

## 2016-06-10 DIAGNOSIS — M4806 Spinal stenosis, lumbar region: Secondary | ICD-10-CM | POA: Diagnosis not present

## 2016-06-10 DIAGNOSIS — N183 Chronic kidney disease, stage 3 (moderate): Secondary | ICD-10-CM | POA: Diagnosis not present

## 2016-06-12 DIAGNOSIS — I130 Hypertensive heart and chronic kidney disease with heart failure and stage 1 through stage 4 chronic kidney disease, or unspecified chronic kidney disease: Secondary | ICD-10-CM | POA: Diagnosis not present

## 2016-06-12 DIAGNOSIS — M353 Polymyalgia rheumatica: Secondary | ICD-10-CM | POA: Diagnosis not present

## 2016-06-12 DIAGNOSIS — I5032 Chronic diastolic (congestive) heart failure: Secondary | ICD-10-CM | POA: Diagnosis not present

## 2016-06-12 DIAGNOSIS — M0609 Rheumatoid arthritis without rheumatoid factor, multiple sites: Secondary | ICD-10-CM | POA: Diagnosis not present

## 2016-06-12 DIAGNOSIS — M4806 Spinal stenosis, lumbar region: Secondary | ICD-10-CM | POA: Diagnosis not present

## 2016-06-12 DIAGNOSIS — N183 Chronic kidney disease, stage 3 (moderate): Secondary | ICD-10-CM | POA: Diagnosis not present

## 2016-06-13 DIAGNOSIS — I5032 Chronic diastolic (congestive) heart failure: Secondary | ICD-10-CM | POA: Diagnosis not present

## 2016-06-13 DIAGNOSIS — M0609 Rheumatoid arthritis without rheumatoid factor, multiple sites: Secondary | ICD-10-CM | POA: Diagnosis not present

## 2016-06-13 DIAGNOSIS — I130 Hypertensive heart and chronic kidney disease with heart failure and stage 1 through stage 4 chronic kidney disease, or unspecified chronic kidney disease: Secondary | ICD-10-CM | POA: Diagnosis not present

## 2016-06-13 DIAGNOSIS — M353 Polymyalgia rheumatica: Secondary | ICD-10-CM | POA: Diagnosis not present

## 2016-06-13 DIAGNOSIS — M4806 Spinal stenosis, lumbar region: Secondary | ICD-10-CM | POA: Diagnosis not present

## 2016-06-13 DIAGNOSIS — N183 Chronic kidney disease, stage 3 (moderate): Secondary | ICD-10-CM | POA: Diagnosis not present

## 2016-06-16 DIAGNOSIS — N390 Urinary tract infection, site not specified: Secondary | ICD-10-CM | POA: Diagnosis not present

## 2016-06-17 DIAGNOSIS — I5032 Chronic diastolic (congestive) heart failure: Secondary | ICD-10-CM | POA: Diagnosis not present

## 2016-06-17 DIAGNOSIS — N183 Chronic kidney disease, stage 3 (moderate): Secondary | ICD-10-CM | POA: Diagnosis not present

## 2016-06-17 DIAGNOSIS — M353 Polymyalgia rheumatica: Secondary | ICD-10-CM | POA: Diagnosis not present

## 2016-06-17 DIAGNOSIS — M4806 Spinal stenosis, lumbar region: Secondary | ICD-10-CM | POA: Diagnosis not present

## 2016-06-17 DIAGNOSIS — M0609 Rheumatoid arthritis without rheumatoid factor, multiple sites: Secondary | ICD-10-CM | POA: Diagnosis not present

## 2016-06-17 DIAGNOSIS — I130 Hypertensive heart and chronic kidney disease with heart failure and stage 1 through stage 4 chronic kidney disease, or unspecified chronic kidney disease: Secondary | ICD-10-CM | POA: Diagnosis not present

## 2016-06-19 DIAGNOSIS — M4806 Spinal stenosis, lumbar region: Secondary | ICD-10-CM | POA: Diagnosis not present

## 2016-06-19 DIAGNOSIS — M353 Polymyalgia rheumatica: Secondary | ICD-10-CM | POA: Diagnosis not present

## 2016-06-19 DIAGNOSIS — M0609 Rheumatoid arthritis without rheumatoid factor, multiple sites: Secondary | ICD-10-CM | POA: Diagnosis not present

## 2016-06-19 DIAGNOSIS — I5032 Chronic diastolic (congestive) heart failure: Secondary | ICD-10-CM | POA: Diagnosis not present

## 2016-06-19 DIAGNOSIS — N183 Chronic kidney disease, stage 3 (moderate): Secondary | ICD-10-CM | POA: Diagnosis not present

## 2016-06-19 DIAGNOSIS — I130 Hypertensive heart and chronic kidney disease with heart failure and stage 1 through stage 4 chronic kidney disease, or unspecified chronic kidney disease: Secondary | ICD-10-CM | POA: Diagnosis not present

## 2016-06-25 DIAGNOSIS — I130 Hypertensive heart and chronic kidney disease with heart failure and stage 1 through stage 4 chronic kidney disease, or unspecified chronic kidney disease: Secondary | ICD-10-CM | POA: Diagnosis not present

## 2016-06-25 DIAGNOSIS — N183 Chronic kidney disease, stage 3 (moderate): Secondary | ICD-10-CM | POA: Diagnosis not present

## 2016-06-25 DIAGNOSIS — I5032 Chronic diastolic (congestive) heart failure: Secondary | ICD-10-CM | POA: Diagnosis not present

## 2016-06-25 DIAGNOSIS — M353 Polymyalgia rheumatica: Secondary | ICD-10-CM | POA: Diagnosis not present

## 2016-06-25 DIAGNOSIS — M4806 Spinal stenosis, lumbar region: Secondary | ICD-10-CM | POA: Diagnosis not present

## 2016-06-25 DIAGNOSIS — M0609 Rheumatoid arthritis without rheumatoid factor, multiple sites: Secondary | ICD-10-CM | POA: Diagnosis not present

## 2016-06-27 DIAGNOSIS — M0609 Rheumatoid arthritis without rheumatoid factor, multiple sites: Secondary | ICD-10-CM | POA: Diagnosis not present

## 2016-06-27 DIAGNOSIS — I5032 Chronic diastolic (congestive) heart failure: Secondary | ICD-10-CM | POA: Diagnosis not present

## 2016-06-27 DIAGNOSIS — N183 Chronic kidney disease, stage 3 (moderate): Secondary | ICD-10-CM | POA: Diagnosis not present

## 2016-06-27 DIAGNOSIS — M353 Polymyalgia rheumatica: Secondary | ICD-10-CM | POA: Diagnosis not present

## 2016-06-27 DIAGNOSIS — M4806 Spinal stenosis, lumbar region: Secondary | ICD-10-CM | POA: Diagnosis not present

## 2016-06-27 DIAGNOSIS — I130 Hypertensive heart and chronic kidney disease with heart failure and stage 1 through stage 4 chronic kidney disease, or unspecified chronic kidney disease: Secondary | ICD-10-CM | POA: Diagnosis not present

## 2016-07-01 DIAGNOSIS — I5032 Chronic diastolic (congestive) heart failure: Secondary | ICD-10-CM | POA: Diagnosis not present

## 2016-07-01 DIAGNOSIS — M4806 Spinal stenosis, lumbar region: Secondary | ICD-10-CM | POA: Diagnosis not present

## 2016-07-01 DIAGNOSIS — N183 Chronic kidney disease, stage 3 (moderate): Secondary | ICD-10-CM | POA: Diagnosis not present

## 2016-07-01 DIAGNOSIS — I130 Hypertensive heart and chronic kidney disease with heart failure and stage 1 through stage 4 chronic kidney disease, or unspecified chronic kidney disease: Secondary | ICD-10-CM | POA: Diagnosis not present

## 2016-07-01 DIAGNOSIS — M353 Polymyalgia rheumatica: Secondary | ICD-10-CM | POA: Diagnosis not present

## 2016-07-01 DIAGNOSIS — M0609 Rheumatoid arthritis without rheumatoid factor, multiple sites: Secondary | ICD-10-CM | POA: Diagnosis not present

## 2016-07-03 DIAGNOSIS — N183 Chronic kidney disease, stage 3 (moderate): Secondary | ICD-10-CM | POA: Diagnosis not present

## 2016-07-03 DIAGNOSIS — M4806 Spinal stenosis, lumbar region: Secondary | ICD-10-CM | POA: Diagnosis not present

## 2016-07-03 DIAGNOSIS — I5032 Chronic diastolic (congestive) heart failure: Secondary | ICD-10-CM | POA: Diagnosis not present

## 2016-07-03 DIAGNOSIS — M0609 Rheumatoid arthritis without rheumatoid factor, multiple sites: Secondary | ICD-10-CM | POA: Diagnosis not present

## 2016-07-03 DIAGNOSIS — I130 Hypertensive heart and chronic kidney disease with heart failure and stage 1 through stage 4 chronic kidney disease, or unspecified chronic kidney disease: Secondary | ICD-10-CM | POA: Diagnosis not present

## 2016-07-03 DIAGNOSIS — M353 Polymyalgia rheumatica: Secondary | ICD-10-CM | POA: Diagnosis not present

## 2016-07-07 DIAGNOSIS — N183 Chronic kidney disease, stage 3 (moderate): Secondary | ICD-10-CM | POA: Diagnosis not present

## 2016-07-07 DIAGNOSIS — M353 Polymyalgia rheumatica: Secondary | ICD-10-CM | POA: Diagnosis not present

## 2016-07-07 DIAGNOSIS — I5032 Chronic diastolic (congestive) heart failure: Secondary | ICD-10-CM | POA: Diagnosis not present

## 2016-07-07 DIAGNOSIS — M0609 Rheumatoid arthritis without rheumatoid factor, multiple sites: Secondary | ICD-10-CM | POA: Diagnosis not present

## 2016-07-07 DIAGNOSIS — M4806 Spinal stenosis, lumbar region: Secondary | ICD-10-CM | POA: Diagnosis not present

## 2016-07-07 DIAGNOSIS — I130 Hypertensive heart and chronic kidney disease with heart failure and stage 1 through stage 4 chronic kidney disease, or unspecified chronic kidney disease: Secondary | ICD-10-CM | POA: Diagnosis not present

## 2016-07-11 DIAGNOSIS — M353 Polymyalgia rheumatica: Secondary | ICD-10-CM | POA: Diagnosis not present

## 2016-07-11 DIAGNOSIS — N183 Chronic kidney disease, stage 3 (moderate): Secondary | ICD-10-CM | POA: Diagnosis not present

## 2016-07-11 DIAGNOSIS — M0609 Rheumatoid arthritis without rheumatoid factor, multiple sites: Secondary | ICD-10-CM | POA: Diagnosis not present

## 2016-07-11 DIAGNOSIS — I130 Hypertensive heart and chronic kidney disease with heart failure and stage 1 through stage 4 chronic kidney disease, or unspecified chronic kidney disease: Secondary | ICD-10-CM | POA: Diagnosis not present

## 2016-07-11 DIAGNOSIS — I5032 Chronic diastolic (congestive) heart failure: Secondary | ICD-10-CM | POA: Diagnosis not present

## 2016-07-11 DIAGNOSIS — M4806 Spinal stenosis, lumbar region: Secondary | ICD-10-CM | POA: Diagnosis not present

## 2016-07-15 DIAGNOSIS — I130 Hypertensive heart and chronic kidney disease with heart failure and stage 1 through stage 4 chronic kidney disease, or unspecified chronic kidney disease: Secondary | ICD-10-CM | POA: Diagnosis not present

## 2016-07-15 DIAGNOSIS — N183 Chronic kidney disease, stage 3 (moderate): Secondary | ICD-10-CM | POA: Diagnosis not present

## 2016-07-15 DIAGNOSIS — I5032 Chronic diastolic (congestive) heart failure: Secondary | ICD-10-CM | POA: Diagnosis not present

## 2016-07-15 DIAGNOSIS — M0609 Rheumatoid arthritis without rheumatoid factor, multiple sites: Secondary | ICD-10-CM | POA: Diagnosis not present

## 2016-07-15 DIAGNOSIS — M4806 Spinal stenosis, lumbar region: Secondary | ICD-10-CM | POA: Diagnosis not present

## 2016-07-15 DIAGNOSIS — M353 Polymyalgia rheumatica: Secondary | ICD-10-CM | POA: Diagnosis not present

## 2016-07-17 DIAGNOSIS — M0609 Rheumatoid arthritis without rheumatoid factor, multiple sites: Secondary | ICD-10-CM | POA: Diagnosis not present

## 2016-07-17 DIAGNOSIS — N183 Chronic kidney disease, stage 3 (moderate): Secondary | ICD-10-CM | POA: Diagnosis not present

## 2016-07-17 DIAGNOSIS — I5032 Chronic diastolic (congestive) heart failure: Secondary | ICD-10-CM | POA: Diagnosis not present

## 2016-07-17 DIAGNOSIS — M353 Polymyalgia rheumatica: Secondary | ICD-10-CM | POA: Diagnosis not present

## 2016-07-17 DIAGNOSIS — M4806 Spinal stenosis, lumbar region: Secondary | ICD-10-CM | POA: Diagnosis not present

## 2016-07-17 DIAGNOSIS — I130 Hypertensive heart and chronic kidney disease with heart failure and stage 1 through stage 4 chronic kidney disease, or unspecified chronic kidney disease: Secondary | ICD-10-CM | POA: Diagnosis not present

## 2016-07-22 DIAGNOSIS — N183 Chronic kidney disease, stage 3 (moderate): Secondary | ICD-10-CM | POA: Diagnosis not present

## 2016-07-22 DIAGNOSIS — M0609 Rheumatoid arthritis without rheumatoid factor, multiple sites: Secondary | ICD-10-CM | POA: Diagnosis not present

## 2016-07-22 DIAGNOSIS — M4806 Spinal stenosis, lumbar region: Secondary | ICD-10-CM | POA: Diagnosis not present

## 2016-07-22 DIAGNOSIS — I130 Hypertensive heart and chronic kidney disease with heart failure and stage 1 through stage 4 chronic kidney disease, or unspecified chronic kidney disease: Secondary | ICD-10-CM | POA: Diagnosis not present

## 2016-07-22 DIAGNOSIS — M353 Polymyalgia rheumatica: Secondary | ICD-10-CM | POA: Diagnosis not present

## 2016-07-22 DIAGNOSIS — I5032 Chronic diastolic (congestive) heart failure: Secondary | ICD-10-CM | POA: Diagnosis not present

## 2016-07-27 DIAGNOSIS — N183 Chronic kidney disease, stage 3 (moderate): Secondary | ICD-10-CM | POA: Diagnosis not present

## 2016-07-27 DIAGNOSIS — M4806 Spinal stenosis, lumbar region: Secondary | ICD-10-CM | POA: Diagnosis not present

## 2016-07-27 DIAGNOSIS — Z87440 Personal history of urinary (tract) infections: Secondary | ICD-10-CM | POA: Diagnosis not present

## 2016-07-27 DIAGNOSIS — M353 Polymyalgia rheumatica: Secondary | ICD-10-CM | POA: Diagnosis not present

## 2016-07-27 DIAGNOSIS — I130 Hypertensive heart and chronic kidney disease with heart failure and stage 1 through stage 4 chronic kidney disease, or unspecified chronic kidney disease: Secondary | ICD-10-CM | POA: Diagnosis not present

## 2016-07-27 DIAGNOSIS — I5032 Chronic diastolic (congestive) heart failure: Secondary | ICD-10-CM | POA: Diagnosis not present

## 2016-07-27 DIAGNOSIS — M0609 Rheumatoid arthritis without rheumatoid factor, multiple sites: Secondary | ICD-10-CM | POA: Diagnosis not present

## 2016-07-27 DIAGNOSIS — I2510 Atherosclerotic heart disease of native coronary artery without angina pectoris: Secondary | ICD-10-CM | POA: Diagnosis not present

## 2016-07-27 DIAGNOSIS — I252 Old myocardial infarction: Secondary | ICD-10-CM | POA: Diagnosis not present

## 2016-07-27 DIAGNOSIS — Z950 Presence of cardiac pacemaker: Secondary | ICD-10-CM | POA: Diagnosis not present

## 2016-07-29 DIAGNOSIS — M4806 Spinal stenosis, lumbar region: Secondary | ICD-10-CM | POA: Diagnosis not present

## 2016-07-29 DIAGNOSIS — M353 Polymyalgia rheumatica: Secondary | ICD-10-CM | POA: Diagnosis not present

## 2016-07-29 DIAGNOSIS — I130 Hypertensive heart and chronic kidney disease with heart failure and stage 1 through stage 4 chronic kidney disease, or unspecified chronic kidney disease: Secondary | ICD-10-CM | POA: Diagnosis not present

## 2016-07-29 DIAGNOSIS — I5032 Chronic diastolic (congestive) heart failure: Secondary | ICD-10-CM | POA: Diagnosis not present

## 2016-07-29 DIAGNOSIS — M0609 Rheumatoid arthritis without rheumatoid factor, multiple sites: Secondary | ICD-10-CM | POA: Diagnosis not present

## 2016-07-29 DIAGNOSIS — N183 Chronic kidney disease, stage 3 (moderate): Secondary | ICD-10-CM | POA: Diagnosis not present

## 2016-07-30 DIAGNOSIS — N183 Chronic kidney disease, stage 3 (moderate): Secondary | ICD-10-CM | POA: Diagnosis not present

## 2016-07-30 DIAGNOSIS — M0609 Rheumatoid arthritis without rheumatoid factor, multiple sites: Secondary | ICD-10-CM | POA: Diagnosis not present

## 2016-07-30 DIAGNOSIS — M4806 Spinal stenosis, lumbar region: Secondary | ICD-10-CM | POA: Diagnosis not present

## 2016-07-30 DIAGNOSIS — I5032 Chronic diastolic (congestive) heart failure: Secondary | ICD-10-CM | POA: Diagnosis not present

## 2016-07-30 DIAGNOSIS — I130 Hypertensive heart and chronic kidney disease with heart failure and stage 1 through stage 4 chronic kidney disease, or unspecified chronic kidney disease: Secondary | ICD-10-CM | POA: Diagnosis not present

## 2016-07-30 DIAGNOSIS — M353 Polymyalgia rheumatica: Secondary | ICD-10-CM | POA: Diagnosis not present

## 2016-08-01 DIAGNOSIS — I5032 Chronic diastolic (congestive) heart failure: Secondary | ICD-10-CM | POA: Diagnosis not present

## 2016-08-01 DIAGNOSIS — M353 Polymyalgia rheumatica: Secondary | ICD-10-CM | POA: Diagnosis not present

## 2016-08-01 DIAGNOSIS — N183 Chronic kidney disease, stage 3 (moderate): Secondary | ICD-10-CM | POA: Diagnosis not present

## 2016-08-01 DIAGNOSIS — I130 Hypertensive heart and chronic kidney disease with heart failure and stage 1 through stage 4 chronic kidney disease, or unspecified chronic kidney disease: Secondary | ICD-10-CM | POA: Diagnosis not present

## 2016-08-01 DIAGNOSIS — M0609 Rheumatoid arthritis without rheumatoid factor, multiple sites: Secondary | ICD-10-CM | POA: Diagnosis not present

## 2016-08-01 DIAGNOSIS — M4806 Spinal stenosis, lumbar region: Secondary | ICD-10-CM | POA: Diagnosis not present

## 2016-08-05 DIAGNOSIS — M353 Polymyalgia rheumatica: Secondary | ICD-10-CM | POA: Diagnosis not present

## 2016-08-05 DIAGNOSIS — M0609 Rheumatoid arthritis without rheumatoid factor, multiple sites: Secondary | ICD-10-CM | POA: Diagnosis not present

## 2016-08-05 DIAGNOSIS — I130 Hypertensive heart and chronic kidney disease with heart failure and stage 1 through stage 4 chronic kidney disease, or unspecified chronic kidney disease: Secondary | ICD-10-CM | POA: Diagnosis not present

## 2016-08-05 DIAGNOSIS — I5032 Chronic diastolic (congestive) heart failure: Secondary | ICD-10-CM | POA: Diagnosis not present

## 2016-08-05 DIAGNOSIS — N183 Chronic kidney disease, stage 3 (moderate): Secondary | ICD-10-CM | POA: Diagnosis not present

## 2016-08-05 DIAGNOSIS — M4806 Spinal stenosis, lumbar region: Secondary | ICD-10-CM | POA: Diagnosis not present

## 2016-08-07 DIAGNOSIS — I5032 Chronic diastolic (congestive) heart failure: Secondary | ICD-10-CM | POA: Diagnosis not present

## 2016-08-07 DIAGNOSIS — M4806 Spinal stenosis, lumbar region: Secondary | ICD-10-CM | POA: Diagnosis not present

## 2016-08-07 DIAGNOSIS — M0609 Rheumatoid arthritis without rheumatoid factor, multiple sites: Secondary | ICD-10-CM | POA: Diagnosis not present

## 2016-08-07 DIAGNOSIS — M353 Polymyalgia rheumatica: Secondary | ICD-10-CM | POA: Diagnosis not present

## 2016-08-07 DIAGNOSIS — I130 Hypertensive heart and chronic kidney disease with heart failure and stage 1 through stage 4 chronic kidney disease, or unspecified chronic kidney disease: Secondary | ICD-10-CM | POA: Diagnosis not present

## 2016-08-07 DIAGNOSIS — N183 Chronic kidney disease, stage 3 (moderate): Secondary | ICD-10-CM | POA: Diagnosis not present

## 2016-08-08 DIAGNOSIS — R531 Weakness: Secondary | ICD-10-CM | POA: Diagnosis not present

## 2016-08-11 DIAGNOSIS — I5032 Chronic diastolic (congestive) heart failure: Secondary | ICD-10-CM | POA: Diagnosis not present

## 2016-08-11 DIAGNOSIS — M0609 Rheumatoid arthritis without rheumatoid factor, multiple sites: Secondary | ICD-10-CM | POA: Diagnosis not present

## 2016-08-11 DIAGNOSIS — M4806 Spinal stenosis, lumbar region: Secondary | ICD-10-CM | POA: Diagnosis not present

## 2016-08-11 DIAGNOSIS — M353 Polymyalgia rheumatica: Secondary | ICD-10-CM | POA: Diagnosis not present

## 2016-08-11 DIAGNOSIS — N183 Chronic kidney disease, stage 3 (moderate): Secondary | ICD-10-CM | POA: Diagnosis not present

## 2016-08-11 DIAGNOSIS — I130 Hypertensive heart and chronic kidney disease with heart failure and stage 1 through stage 4 chronic kidney disease, or unspecified chronic kidney disease: Secondary | ICD-10-CM | POA: Diagnosis not present

## 2016-08-12 DIAGNOSIS — I130 Hypertensive heart and chronic kidney disease with heart failure and stage 1 through stage 4 chronic kidney disease, or unspecified chronic kidney disease: Secondary | ICD-10-CM | POA: Diagnosis not present

## 2016-08-12 DIAGNOSIS — I5032 Chronic diastolic (congestive) heart failure: Secondary | ICD-10-CM | POA: Diagnosis not present

## 2016-08-12 DIAGNOSIS — N183 Chronic kidney disease, stage 3 (moderate): Secondary | ICD-10-CM | POA: Diagnosis not present

## 2016-08-12 DIAGNOSIS — M4806 Spinal stenosis, lumbar region: Secondary | ICD-10-CM | POA: Diagnosis not present

## 2016-08-12 DIAGNOSIS — M353 Polymyalgia rheumatica: Secondary | ICD-10-CM | POA: Diagnosis not present

## 2016-08-12 DIAGNOSIS — M0609 Rheumatoid arthritis without rheumatoid factor, multiple sites: Secondary | ICD-10-CM | POA: Diagnosis not present

## 2016-08-14 DIAGNOSIS — I5032 Chronic diastolic (congestive) heart failure: Secondary | ICD-10-CM | POA: Diagnosis not present

## 2016-08-14 DIAGNOSIS — M4806 Spinal stenosis, lumbar region: Secondary | ICD-10-CM | POA: Diagnosis not present

## 2016-08-14 DIAGNOSIS — M353 Polymyalgia rheumatica: Secondary | ICD-10-CM | POA: Diagnosis not present

## 2016-08-14 DIAGNOSIS — N183 Chronic kidney disease, stage 3 (moderate): Secondary | ICD-10-CM | POA: Diagnosis not present

## 2016-08-14 DIAGNOSIS — I130 Hypertensive heart and chronic kidney disease with heart failure and stage 1 through stage 4 chronic kidney disease, or unspecified chronic kidney disease: Secondary | ICD-10-CM | POA: Diagnosis not present

## 2016-08-14 DIAGNOSIS — M0609 Rheumatoid arthritis without rheumatoid factor, multiple sites: Secondary | ICD-10-CM | POA: Diagnosis not present

## 2016-08-15 DIAGNOSIS — R269 Unspecified abnormalities of gait and mobility: Secondary | ICD-10-CM | POA: Diagnosis not present

## 2016-08-15 DIAGNOSIS — Q253 Supravalvular aortic stenosis: Secondary | ICD-10-CM | POA: Diagnosis not present

## 2016-08-15 DIAGNOSIS — I129 Hypertensive chronic kidney disease with stage 1 through stage 4 chronic kidney disease, or unspecified chronic kidney disease: Secondary | ICD-10-CM | POA: Diagnosis not present

## 2016-08-15 DIAGNOSIS — Z23 Encounter for immunization: Secondary | ICD-10-CM | POA: Diagnosis not present

## 2016-08-15 DIAGNOSIS — N183 Chronic kidney disease, stage 3 (moderate): Secondary | ICD-10-CM | POA: Diagnosis not present

## 2016-08-19 DIAGNOSIS — M4806 Spinal stenosis, lumbar region: Secondary | ICD-10-CM | POA: Diagnosis not present

## 2016-08-19 DIAGNOSIS — M353 Polymyalgia rheumatica: Secondary | ICD-10-CM | POA: Diagnosis not present

## 2016-08-19 DIAGNOSIS — N183 Chronic kidney disease, stage 3 (moderate): Secondary | ICD-10-CM | POA: Diagnosis not present

## 2016-08-19 DIAGNOSIS — I130 Hypertensive heart and chronic kidney disease with heart failure and stage 1 through stage 4 chronic kidney disease, or unspecified chronic kidney disease: Secondary | ICD-10-CM | POA: Diagnosis not present

## 2016-08-19 DIAGNOSIS — I5032 Chronic diastolic (congestive) heart failure: Secondary | ICD-10-CM | POA: Diagnosis not present

## 2016-08-19 DIAGNOSIS — M0609 Rheumatoid arthritis without rheumatoid factor, multiple sites: Secondary | ICD-10-CM | POA: Diagnosis not present

## 2016-08-20 DIAGNOSIS — M353 Polymyalgia rheumatica: Secondary | ICD-10-CM | POA: Diagnosis not present

## 2016-08-20 DIAGNOSIS — I5032 Chronic diastolic (congestive) heart failure: Secondary | ICD-10-CM | POA: Diagnosis not present

## 2016-08-20 DIAGNOSIS — N183 Chronic kidney disease, stage 3 (moderate): Secondary | ICD-10-CM | POA: Diagnosis not present

## 2016-08-20 DIAGNOSIS — M0609 Rheumatoid arthritis without rheumatoid factor, multiple sites: Secondary | ICD-10-CM | POA: Diagnosis not present

## 2016-08-20 DIAGNOSIS — I130 Hypertensive heart and chronic kidney disease with heart failure and stage 1 through stage 4 chronic kidney disease, or unspecified chronic kidney disease: Secondary | ICD-10-CM | POA: Diagnosis not present

## 2016-08-20 DIAGNOSIS — M4806 Spinal stenosis, lumbar region: Secondary | ICD-10-CM | POA: Diagnosis not present

## 2016-08-21 DIAGNOSIS — M353 Polymyalgia rheumatica: Secondary | ICD-10-CM | POA: Diagnosis not present

## 2016-08-21 DIAGNOSIS — I130 Hypertensive heart and chronic kidney disease with heart failure and stage 1 through stage 4 chronic kidney disease, or unspecified chronic kidney disease: Secondary | ICD-10-CM | POA: Diagnosis not present

## 2016-08-21 DIAGNOSIS — M0609 Rheumatoid arthritis without rheumatoid factor, multiple sites: Secondary | ICD-10-CM | POA: Diagnosis not present

## 2016-08-21 DIAGNOSIS — N183 Chronic kidney disease, stage 3 (moderate): Secondary | ICD-10-CM | POA: Diagnosis not present

## 2016-08-21 DIAGNOSIS — I5032 Chronic diastolic (congestive) heart failure: Secondary | ICD-10-CM | POA: Diagnosis not present

## 2016-08-21 DIAGNOSIS — M4806 Spinal stenosis, lumbar region: Secondary | ICD-10-CM | POA: Diagnosis not present

## 2016-08-22 DIAGNOSIS — M4806 Spinal stenosis, lumbar region: Secondary | ICD-10-CM | POA: Diagnosis not present

## 2016-08-22 DIAGNOSIS — M0609 Rheumatoid arthritis without rheumatoid factor, multiple sites: Secondary | ICD-10-CM | POA: Diagnosis not present

## 2016-08-22 DIAGNOSIS — I5032 Chronic diastolic (congestive) heart failure: Secondary | ICD-10-CM | POA: Diagnosis not present

## 2016-08-22 DIAGNOSIS — M353 Polymyalgia rheumatica: Secondary | ICD-10-CM | POA: Diagnosis not present

## 2016-08-22 DIAGNOSIS — N183 Chronic kidney disease, stage 3 (moderate): Secondary | ICD-10-CM | POA: Diagnosis not present

## 2016-08-22 DIAGNOSIS — I130 Hypertensive heart and chronic kidney disease with heart failure and stage 1 through stage 4 chronic kidney disease, or unspecified chronic kidney disease: Secondary | ICD-10-CM | POA: Diagnosis not present

## 2016-08-25 DIAGNOSIS — Z79899 Other long term (current) drug therapy: Secondary | ICD-10-CM | POA: Diagnosis not present

## 2016-08-25 DIAGNOSIS — M150 Primary generalized (osteo)arthritis: Secondary | ICD-10-CM | POA: Diagnosis not present

## 2016-08-25 DIAGNOSIS — M2550 Pain in unspecified joint: Secondary | ICD-10-CM | POA: Diagnosis not present

## 2016-08-25 DIAGNOSIS — M353 Polymyalgia rheumatica: Secondary | ICD-10-CM | POA: Diagnosis not present

## 2016-08-25 DIAGNOSIS — M064 Inflammatory polyarthropathy: Secondary | ICD-10-CM | POA: Diagnosis not present

## 2016-08-27 DIAGNOSIS — N183 Chronic kidney disease, stage 3 (moderate): Secondary | ICD-10-CM | POA: Diagnosis not present

## 2016-08-27 DIAGNOSIS — M353 Polymyalgia rheumatica: Secondary | ICD-10-CM | POA: Diagnosis not present

## 2016-08-27 DIAGNOSIS — I5032 Chronic diastolic (congestive) heart failure: Secondary | ICD-10-CM | POA: Diagnosis not present

## 2016-08-27 DIAGNOSIS — I130 Hypertensive heart and chronic kidney disease with heart failure and stage 1 through stage 4 chronic kidney disease, or unspecified chronic kidney disease: Secondary | ICD-10-CM | POA: Diagnosis not present

## 2016-08-27 DIAGNOSIS — M0609 Rheumatoid arthritis without rheumatoid factor, multiple sites: Secondary | ICD-10-CM | POA: Diagnosis not present

## 2016-08-27 DIAGNOSIS — M4806 Spinal stenosis, lumbar region: Secondary | ICD-10-CM | POA: Diagnosis not present

## 2016-08-29 DIAGNOSIS — M0609 Rheumatoid arthritis without rheumatoid factor, multiple sites: Secondary | ICD-10-CM | POA: Diagnosis not present

## 2016-08-29 DIAGNOSIS — M4806 Spinal stenosis, lumbar region: Secondary | ICD-10-CM | POA: Diagnosis not present

## 2016-08-29 DIAGNOSIS — I130 Hypertensive heart and chronic kidney disease with heart failure and stage 1 through stage 4 chronic kidney disease, or unspecified chronic kidney disease: Secondary | ICD-10-CM | POA: Diagnosis not present

## 2016-08-29 DIAGNOSIS — N183 Chronic kidney disease, stage 3 (moderate): Secondary | ICD-10-CM | POA: Diagnosis not present

## 2016-08-29 DIAGNOSIS — M353 Polymyalgia rheumatica: Secondary | ICD-10-CM | POA: Diagnosis not present

## 2016-08-29 DIAGNOSIS — I5032 Chronic diastolic (congestive) heart failure: Secondary | ICD-10-CM | POA: Diagnosis not present

## 2016-09-02 DIAGNOSIS — I5032 Chronic diastolic (congestive) heart failure: Secondary | ICD-10-CM | POA: Diagnosis not present

## 2016-09-02 DIAGNOSIS — I130 Hypertensive heart and chronic kidney disease with heart failure and stage 1 through stage 4 chronic kidney disease, or unspecified chronic kidney disease: Secondary | ICD-10-CM | POA: Diagnosis not present

## 2016-09-02 DIAGNOSIS — M4806 Spinal stenosis, lumbar region: Secondary | ICD-10-CM | POA: Diagnosis not present

## 2016-09-02 DIAGNOSIS — M353 Polymyalgia rheumatica: Secondary | ICD-10-CM | POA: Diagnosis not present

## 2016-09-02 DIAGNOSIS — M0609 Rheumatoid arthritis without rheumatoid factor, multiple sites: Secondary | ICD-10-CM | POA: Diagnosis not present

## 2016-09-02 DIAGNOSIS — N183 Chronic kidney disease, stage 3 (moderate): Secondary | ICD-10-CM | POA: Diagnosis not present

## 2016-09-04 DIAGNOSIS — I130 Hypertensive heart and chronic kidney disease with heart failure and stage 1 through stage 4 chronic kidney disease, or unspecified chronic kidney disease: Secondary | ICD-10-CM | POA: Diagnosis not present

## 2016-09-04 DIAGNOSIS — N183 Chronic kidney disease, stage 3 (moderate): Secondary | ICD-10-CM | POA: Diagnosis not present

## 2016-09-04 DIAGNOSIS — I5032 Chronic diastolic (congestive) heart failure: Secondary | ICD-10-CM | POA: Diagnosis not present

## 2016-09-04 DIAGNOSIS — M353 Polymyalgia rheumatica: Secondary | ICD-10-CM | POA: Diagnosis not present

## 2016-09-04 DIAGNOSIS — M4806 Spinal stenosis, lumbar region: Secondary | ICD-10-CM | POA: Diagnosis not present

## 2016-09-04 DIAGNOSIS — M0609 Rheumatoid arthritis without rheumatoid factor, multiple sites: Secondary | ICD-10-CM | POA: Diagnosis not present

## 2016-10-22 ENCOUNTER — Ambulatory Visit: Payer: Medicare Other | Admitting: Physical Therapy

## 2016-10-23 ENCOUNTER — Ambulatory Visit: Payer: Medicare Other | Admitting: Physical Therapy

## 2016-10-24 ENCOUNTER — Ambulatory Visit: Payer: Medicare Other | Admitting: Physical Therapy

## 2016-11-05 ENCOUNTER — Ambulatory Visit: Payer: Medicare Other | Admitting: Physical Therapy

## 2016-11-14 ENCOUNTER — Ambulatory Visit: Payer: Medicare Other | Admitting: Rehabilitation

## 2016-11-20 ENCOUNTER — Ambulatory Visit: Payer: Medicare Other | Admitting: Rehabilitation

## 2016-11-20 DIAGNOSIS — M48061 Spinal stenosis, lumbar region without neurogenic claudication: Secondary | ICD-10-CM | POA: Diagnosis not present

## 2016-11-20 DIAGNOSIS — Q253 Supravalvular aortic stenosis: Secondary | ICD-10-CM | POA: Diagnosis not present

## 2016-11-20 DIAGNOSIS — N390 Urinary tract infection, site not specified: Secondary | ICD-10-CM | POA: Diagnosis not present

## 2016-11-25 DIAGNOSIS — Z79899 Other long term (current) drug therapy: Secondary | ICD-10-CM | POA: Diagnosis not present

## 2016-11-25 DIAGNOSIS — M064 Inflammatory polyarthropathy: Secondary | ICD-10-CM | POA: Diagnosis not present

## 2016-11-25 DIAGNOSIS — M150 Primary generalized (osteo)arthritis: Secondary | ICD-10-CM | POA: Diagnosis not present

## 2016-11-25 DIAGNOSIS — M48061 Spinal stenosis, lumbar region without neurogenic claudication: Secondary | ICD-10-CM | POA: Diagnosis not present

## 2016-11-25 DIAGNOSIS — M353 Polymyalgia rheumatica: Secondary | ICD-10-CM | POA: Diagnosis not present

## 2016-11-27 DIAGNOSIS — Z7952 Long term (current) use of systemic steroids: Secondary | ICD-10-CM | POA: Diagnosis not present

## 2016-11-27 DIAGNOSIS — R2689 Other abnormalities of gait and mobility: Secondary | ICD-10-CM | POA: Diagnosis not present

## 2016-11-27 DIAGNOSIS — M150 Primary generalized (osteo)arthritis: Secondary | ICD-10-CM | POA: Diagnosis not present

## 2016-11-27 DIAGNOSIS — L89321 Pressure ulcer of left buttock, stage 1: Secondary | ICD-10-CM | POA: Diagnosis not present

## 2016-11-27 DIAGNOSIS — N189 Chronic kidney disease, unspecified: Secondary | ICD-10-CM | POA: Diagnosis not present

## 2016-11-27 DIAGNOSIS — E039 Hypothyroidism, unspecified: Secondary | ICD-10-CM | POA: Diagnosis not present

## 2016-11-27 DIAGNOSIS — I129 Hypertensive chronic kidney disease with stage 1 through stage 4 chronic kidney disease, or unspecified chronic kidney disease: Secondary | ICD-10-CM | POA: Diagnosis not present

## 2016-11-27 DIAGNOSIS — M353 Polymyalgia rheumatica: Secondary | ICD-10-CM | POA: Diagnosis not present

## 2016-11-27 DIAGNOSIS — M48061 Spinal stenosis, lumbar region without neurogenic claudication: Secondary | ICD-10-CM | POA: Diagnosis not present

## 2016-11-27 DIAGNOSIS — M064 Inflammatory polyarthropathy: Secondary | ICD-10-CM | POA: Diagnosis not present

## 2016-11-28 ENCOUNTER — Ambulatory Visit: Payer: Medicare Other | Admitting: Rehabilitation

## 2016-12-02 DIAGNOSIS — M353 Polymyalgia rheumatica: Secondary | ICD-10-CM | POA: Diagnosis not present

## 2016-12-02 DIAGNOSIS — N189 Chronic kidney disease, unspecified: Secondary | ICD-10-CM | POA: Diagnosis not present

## 2016-12-02 DIAGNOSIS — M48061 Spinal stenosis, lumbar region without neurogenic claudication: Secondary | ICD-10-CM | POA: Diagnosis not present

## 2016-12-02 DIAGNOSIS — I129 Hypertensive chronic kidney disease with stage 1 through stage 4 chronic kidney disease, or unspecified chronic kidney disease: Secondary | ICD-10-CM | POA: Diagnosis not present

## 2016-12-02 DIAGNOSIS — L89321 Pressure ulcer of left buttock, stage 1: Secondary | ICD-10-CM | POA: Diagnosis not present

## 2016-12-02 DIAGNOSIS — M064 Inflammatory polyarthropathy: Secondary | ICD-10-CM | POA: Diagnosis not present

## 2016-12-04 DIAGNOSIS — L89321 Pressure ulcer of left buttock, stage 1: Secondary | ICD-10-CM | POA: Diagnosis not present

## 2016-12-04 DIAGNOSIS — M064 Inflammatory polyarthropathy: Secondary | ICD-10-CM | POA: Diagnosis not present

## 2016-12-04 DIAGNOSIS — M48061 Spinal stenosis, lumbar region without neurogenic claudication: Secondary | ICD-10-CM | POA: Diagnosis not present

## 2016-12-04 DIAGNOSIS — M353 Polymyalgia rheumatica: Secondary | ICD-10-CM | POA: Diagnosis not present

## 2016-12-04 DIAGNOSIS — I129 Hypertensive chronic kidney disease with stage 1 through stage 4 chronic kidney disease, or unspecified chronic kidney disease: Secondary | ICD-10-CM | POA: Diagnosis not present

## 2016-12-04 DIAGNOSIS — N189 Chronic kidney disease, unspecified: Secondary | ICD-10-CM | POA: Diagnosis not present

## 2016-12-10 DIAGNOSIS — I129 Hypertensive chronic kidney disease with stage 1 through stage 4 chronic kidney disease, or unspecified chronic kidney disease: Secondary | ICD-10-CM | POA: Diagnosis not present

## 2016-12-10 DIAGNOSIS — L89321 Pressure ulcer of left buttock, stage 1: Secondary | ICD-10-CM | POA: Diagnosis not present

## 2016-12-10 DIAGNOSIS — N189 Chronic kidney disease, unspecified: Secondary | ICD-10-CM | POA: Diagnosis not present

## 2016-12-10 DIAGNOSIS — M353 Polymyalgia rheumatica: Secondary | ICD-10-CM | POA: Diagnosis not present

## 2016-12-10 DIAGNOSIS — M064 Inflammatory polyarthropathy: Secondary | ICD-10-CM | POA: Diagnosis not present

## 2016-12-10 DIAGNOSIS — M48061 Spinal stenosis, lumbar region without neurogenic claudication: Secondary | ICD-10-CM | POA: Diagnosis not present

## 2016-12-12 DIAGNOSIS — L89321 Pressure ulcer of left buttock, stage 1: Secondary | ICD-10-CM | POA: Diagnosis not present

## 2016-12-12 DIAGNOSIS — M064 Inflammatory polyarthropathy: Secondary | ICD-10-CM | POA: Diagnosis not present

## 2016-12-12 DIAGNOSIS — N189 Chronic kidney disease, unspecified: Secondary | ICD-10-CM | POA: Diagnosis not present

## 2016-12-12 DIAGNOSIS — M353 Polymyalgia rheumatica: Secondary | ICD-10-CM | POA: Diagnosis not present

## 2016-12-12 DIAGNOSIS — M48061 Spinal stenosis, lumbar region without neurogenic claudication: Secondary | ICD-10-CM | POA: Diagnosis not present

## 2016-12-12 DIAGNOSIS — I129 Hypertensive chronic kidney disease with stage 1 through stage 4 chronic kidney disease, or unspecified chronic kidney disease: Secondary | ICD-10-CM | POA: Diagnosis not present

## 2016-12-17 DIAGNOSIS — M48061 Spinal stenosis, lumbar region without neurogenic claudication: Secondary | ICD-10-CM | POA: Diagnosis not present

## 2016-12-17 DIAGNOSIS — L89321 Pressure ulcer of left buttock, stage 1: Secondary | ICD-10-CM | POA: Diagnosis not present

## 2016-12-17 DIAGNOSIS — M353 Polymyalgia rheumatica: Secondary | ICD-10-CM | POA: Diagnosis not present

## 2016-12-17 DIAGNOSIS — M064 Inflammatory polyarthropathy: Secondary | ICD-10-CM | POA: Diagnosis not present

## 2016-12-17 DIAGNOSIS — N189 Chronic kidney disease, unspecified: Secondary | ICD-10-CM | POA: Diagnosis not present

## 2016-12-17 DIAGNOSIS — I129 Hypertensive chronic kidney disease with stage 1 through stage 4 chronic kidney disease, or unspecified chronic kidney disease: Secondary | ICD-10-CM | POA: Diagnosis not present

## 2016-12-19 DIAGNOSIS — M353 Polymyalgia rheumatica: Secondary | ICD-10-CM | POA: Diagnosis not present

## 2016-12-19 DIAGNOSIS — M48061 Spinal stenosis, lumbar region without neurogenic claudication: Secondary | ICD-10-CM | POA: Diagnosis not present

## 2016-12-19 DIAGNOSIS — L89321 Pressure ulcer of left buttock, stage 1: Secondary | ICD-10-CM | POA: Diagnosis not present

## 2016-12-19 DIAGNOSIS — I129 Hypertensive chronic kidney disease with stage 1 through stage 4 chronic kidney disease, or unspecified chronic kidney disease: Secondary | ICD-10-CM | POA: Diagnosis not present

## 2016-12-19 DIAGNOSIS — M064 Inflammatory polyarthropathy: Secondary | ICD-10-CM | POA: Diagnosis not present

## 2016-12-19 DIAGNOSIS — N189 Chronic kidney disease, unspecified: Secondary | ICD-10-CM | POA: Diagnosis not present

## 2016-12-23 DIAGNOSIS — N189 Chronic kidney disease, unspecified: Secondary | ICD-10-CM | POA: Diagnosis not present

## 2016-12-23 DIAGNOSIS — M48061 Spinal stenosis, lumbar region without neurogenic claudication: Secondary | ICD-10-CM | POA: Diagnosis not present

## 2016-12-23 DIAGNOSIS — M064 Inflammatory polyarthropathy: Secondary | ICD-10-CM | POA: Diagnosis not present

## 2016-12-23 DIAGNOSIS — I129 Hypertensive chronic kidney disease with stage 1 through stage 4 chronic kidney disease, or unspecified chronic kidney disease: Secondary | ICD-10-CM | POA: Diagnosis not present

## 2016-12-23 DIAGNOSIS — M353 Polymyalgia rheumatica: Secondary | ICD-10-CM | POA: Diagnosis not present

## 2016-12-23 DIAGNOSIS — L89321 Pressure ulcer of left buttock, stage 1: Secondary | ICD-10-CM | POA: Diagnosis not present

## 2016-12-25 DIAGNOSIS — M064 Inflammatory polyarthropathy: Secondary | ICD-10-CM | POA: Diagnosis not present

## 2016-12-25 DIAGNOSIS — L89321 Pressure ulcer of left buttock, stage 1: Secondary | ICD-10-CM | POA: Diagnosis not present

## 2016-12-25 DIAGNOSIS — M353 Polymyalgia rheumatica: Secondary | ICD-10-CM | POA: Diagnosis not present

## 2016-12-25 DIAGNOSIS — I129 Hypertensive chronic kidney disease with stage 1 through stage 4 chronic kidney disease, or unspecified chronic kidney disease: Secondary | ICD-10-CM | POA: Diagnosis not present

## 2016-12-25 DIAGNOSIS — N189 Chronic kidney disease, unspecified: Secondary | ICD-10-CM | POA: Diagnosis not present

## 2016-12-25 DIAGNOSIS — M48061 Spinal stenosis, lumbar region without neurogenic claudication: Secondary | ICD-10-CM | POA: Diagnosis not present

## 2016-12-31 DIAGNOSIS — M353 Polymyalgia rheumatica: Secondary | ICD-10-CM | POA: Diagnosis not present

## 2016-12-31 DIAGNOSIS — M48061 Spinal stenosis, lumbar region without neurogenic claudication: Secondary | ICD-10-CM | POA: Diagnosis not present

## 2016-12-31 DIAGNOSIS — L89321 Pressure ulcer of left buttock, stage 1: Secondary | ICD-10-CM | POA: Diagnosis not present

## 2016-12-31 DIAGNOSIS — I129 Hypertensive chronic kidney disease with stage 1 through stage 4 chronic kidney disease, or unspecified chronic kidney disease: Secondary | ICD-10-CM | POA: Diagnosis not present

## 2016-12-31 DIAGNOSIS — M064 Inflammatory polyarthropathy: Secondary | ICD-10-CM | POA: Diagnosis not present

## 2016-12-31 DIAGNOSIS — N189 Chronic kidney disease, unspecified: Secondary | ICD-10-CM | POA: Diagnosis not present

## 2017-01-05 DIAGNOSIS — N390 Urinary tract infection, site not specified: Secondary | ICD-10-CM | POA: Diagnosis not present

## 2017-01-07 DIAGNOSIS — N189 Chronic kidney disease, unspecified: Secondary | ICD-10-CM | POA: Diagnosis not present

## 2017-01-07 DIAGNOSIS — L89321 Pressure ulcer of left buttock, stage 1: Secondary | ICD-10-CM | POA: Diagnosis not present

## 2017-01-07 DIAGNOSIS — M064 Inflammatory polyarthropathy: Secondary | ICD-10-CM | POA: Diagnosis not present

## 2017-01-07 DIAGNOSIS — M48061 Spinal stenosis, lumbar region without neurogenic claudication: Secondary | ICD-10-CM | POA: Diagnosis not present

## 2017-01-07 DIAGNOSIS — M353 Polymyalgia rheumatica: Secondary | ICD-10-CM | POA: Diagnosis not present

## 2017-01-07 DIAGNOSIS — I129 Hypertensive chronic kidney disease with stage 1 through stage 4 chronic kidney disease, or unspecified chronic kidney disease: Secondary | ICD-10-CM | POA: Diagnosis not present

## 2017-01-14 DIAGNOSIS — M353 Polymyalgia rheumatica: Secondary | ICD-10-CM | POA: Diagnosis not present

## 2017-01-14 DIAGNOSIS — M48061 Spinal stenosis, lumbar region without neurogenic claudication: Secondary | ICD-10-CM | POA: Diagnosis not present

## 2017-01-14 DIAGNOSIS — I129 Hypertensive chronic kidney disease with stage 1 through stage 4 chronic kidney disease, or unspecified chronic kidney disease: Secondary | ICD-10-CM | POA: Diagnosis not present

## 2017-01-14 DIAGNOSIS — M064 Inflammatory polyarthropathy: Secondary | ICD-10-CM | POA: Diagnosis not present

## 2017-01-14 DIAGNOSIS — N189 Chronic kidney disease, unspecified: Secondary | ICD-10-CM | POA: Diagnosis not present

## 2017-01-14 DIAGNOSIS — L89321 Pressure ulcer of left buttock, stage 1: Secondary | ICD-10-CM | POA: Diagnosis not present

## 2017-01-20 DIAGNOSIS — N390 Urinary tract infection, site not specified: Secondary | ICD-10-CM | POA: Diagnosis not present

## 2017-01-20 DIAGNOSIS — R300 Dysuria: Secondary | ICD-10-CM | POA: Diagnosis not present

## 2017-01-21 DIAGNOSIS — M48061 Spinal stenosis, lumbar region without neurogenic claudication: Secondary | ICD-10-CM | POA: Diagnosis not present

## 2017-01-21 DIAGNOSIS — M064 Inflammatory polyarthropathy: Secondary | ICD-10-CM | POA: Diagnosis not present

## 2017-01-21 DIAGNOSIS — M353 Polymyalgia rheumatica: Secondary | ICD-10-CM | POA: Diagnosis not present

## 2017-01-21 DIAGNOSIS — N189 Chronic kidney disease, unspecified: Secondary | ICD-10-CM | POA: Diagnosis not present

## 2017-01-21 DIAGNOSIS — I129 Hypertensive chronic kidney disease with stage 1 through stage 4 chronic kidney disease, or unspecified chronic kidney disease: Secondary | ICD-10-CM | POA: Diagnosis not present

## 2017-01-21 DIAGNOSIS — L89321 Pressure ulcer of left buttock, stage 1: Secondary | ICD-10-CM | POA: Diagnosis not present

## 2017-01-24 ENCOUNTER — Inpatient Hospital Stay (HOSPITAL_COMMUNITY): Payer: Medicare Other

## 2017-01-24 ENCOUNTER — Inpatient Hospital Stay (HOSPITAL_COMMUNITY)
Admission: EM | Admit: 2017-01-24 | Discharge: 2017-01-28 | DRG: 682 | Disposition: A | Discharge status: Home / Self Care | Payer: Medicare Other | Attending: Internal Medicine | Admitting: Internal Medicine

## 2017-01-24 ENCOUNTER — Encounter (HOSPITAL_COMMUNITY): Payer: Self-pay

## 2017-01-24 ENCOUNTER — Encounter (HOSPITAL_COMMUNITY): Payer: Self-pay | Admitting: Nurse Practitioner

## 2017-01-24 DIAGNOSIS — B952 Enterococcus as the cause of diseases classified elsewhere: Secondary | ICD-10-CM | POA: Diagnosis present

## 2017-01-24 DIAGNOSIS — E039 Hypothyroidism, unspecified: Secondary | ICD-10-CM

## 2017-01-24 DIAGNOSIS — N179 Acute kidney failure, unspecified: Secondary | ICD-10-CM | POA: Diagnosis not present

## 2017-01-24 DIAGNOSIS — Z87891 Personal history of nicotine dependence: Secondary | ICD-10-CM

## 2017-01-24 DIAGNOSIS — G92 Toxic encephalopathy: Secondary | ICD-10-CM | POA: Diagnosis present

## 2017-01-24 DIAGNOSIS — M353 Polymyalgia rheumatica: Secondary | ICD-10-CM | POA: Diagnosis not present

## 2017-01-24 DIAGNOSIS — Z9049 Acquired absence of other specified parts of digestive tract: Secondary | ICD-10-CM

## 2017-01-24 DIAGNOSIS — I25810 Atherosclerosis of coronary artery bypass graft(s) without angina pectoris: Secondary | ICD-10-CM

## 2017-01-24 DIAGNOSIS — I2510 Atherosclerotic heart disease of native coronary artery without angina pectoris: Secondary | ICD-10-CM | POA: Diagnosis present

## 2017-01-24 DIAGNOSIS — Z8249 Family history of ischemic heart disease and other diseases of the circulatory system: Secondary | ICD-10-CM | POA: Diagnosis not present

## 2017-01-24 DIAGNOSIS — G8929 Other chronic pain: Secondary | ICD-10-CM | POA: Diagnosis present

## 2017-01-24 DIAGNOSIS — I130 Hypertensive heart and chronic kidney disease with heart failure and stage 1 through stage 4 chronic kidney disease, or unspecified chronic kidney disease: Secondary | ICD-10-CM | POA: Diagnosis present

## 2017-01-24 DIAGNOSIS — R338 Other retention of urine: Secondary | ICD-10-CM | POA: Diagnosis present

## 2017-01-24 DIAGNOSIS — I5032 Chronic diastolic (congestive) heart failure: Secondary | ICD-10-CM | POA: Diagnosis not present

## 2017-01-24 DIAGNOSIS — L89159 Pressure ulcer of sacral region, unspecified stage: Secondary | ICD-10-CM

## 2017-01-24 DIAGNOSIS — L8990 Pressure ulcer of unspecified site, unspecified stage: Secondary | ICD-10-CM | POA: Diagnosis present

## 2017-01-24 DIAGNOSIS — N183 Chronic kidney disease, stage 3 unspecified: Secondary | ICD-10-CM | POA: Diagnosis present

## 2017-01-24 DIAGNOSIS — Z951 Presence of aortocoronary bypass graft: Secondary | ICD-10-CM | POA: Diagnosis not present

## 2017-01-24 DIAGNOSIS — Z905 Acquired absence of kidney: Secondary | ICD-10-CM

## 2017-01-24 DIAGNOSIS — Z880 Allergy status to penicillin: Secondary | ICD-10-CM | POA: Diagnosis not present

## 2017-01-24 DIAGNOSIS — Z79899 Other long term (current) drug therapy: Secondary | ICD-10-CM | POA: Diagnosis not present

## 2017-01-24 DIAGNOSIS — Z66 Do not resuscitate: Secondary | ICD-10-CM | POA: Diagnosis present

## 2017-01-24 DIAGNOSIS — Z8673 Personal history of transient ischemic attack (TIA), and cerebral infarction without residual deficits: Secondary | ICD-10-CM | POA: Diagnosis not present

## 2017-01-24 DIAGNOSIS — I10 Essential (primary) hypertension: Secondary | ICD-10-CM | POA: Diagnosis not present

## 2017-01-24 DIAGNOSIS — M545 Low back pain: Secondary | ICD-10-CM | POA: Diagnosis present

## 2017-01-24 DIAGNOSIS — M549 Dorsalgia, unspecified: Secondary | ICD-10-CM

## 2017-01-24 DIAGNOSIS — N189 Chronic kidney disease, unspecified: Secondary | ICD-10-CM

## 2017-01-24 DIAGNOSIS — R404 Transient alteration of awareness: Secondary | ICD-10-CM | POA: Diagnosis not present

## 2017-01-24 DIAGNOSIS — E860 Dehydration: Secondary | ICD-10-CM | POA: Diagnosis present

## 2017-01-24 DIAGNOSIS — N390 Urinary tract infection, site not specified: Secondary | ICD-10-CM | POA: Diagnosis present

## 2017-01-24 DIAGNOSIS — N289 Disorder of kidney and ureter, unspecified: Secondary | ICD-10-CM | POA: Diagnosis not present

## 2017-01-24 DIAGNOSIS — D7589 Other specified diseases of blood and blood-forming organs: Secondary | ICD-10-CM | POA: Diagnosis present

## 2017-01-24 DIAGNOSIS — G9340 Encephalopathy, unspecified: Secondary | ICD-10-CM | POA: Diagnosis not present

## 2017-01-24 DIAGNOSIS — I252 Old myocardial infarction: Secondary | ICD-10-CM

## 2017-01-24 DIAGNOSIS — L89151 Pressure ulcer of sacral region, stage 1: Secondary | ICD-10-CM | POA: Diagnosis present

## 2017-01-24 DIAGNOSIS — R531 Weakness: Secondary | ICD-10-CM | POA: Diagnosis not present

## 2017-01-24 LAB — URINALYSIS, ROUTINE W REFLEX MICROSCOPIC
Bilirubin Urine: NEGATIVE
GLUCOSE, UA: NEGATIVE mg/dL
Ketones, ur: NEGATIVE mg/dL
Nitrite: NEGATIVE
PH: 6 (ref 5.0–8.0)
Protein, ur: NEGATIVE mg/dL
Specific Gravity, Urine: 1.009 (ref 1.005–1.030)
Squamous Epithelial / LPF: NONE SEEN

## 2017-01-24 LAB — COMPREHENSIVE METABOLIC PANEL
ALK PHOS: 64 U/L (ref 38–126)
ALT: 22 U/L (ref 17–63)
ANION GAP: 9 (ref 5–15)
AST: 31 U/L (ref 15–41)
Albumin: 3.2 g/dL — ABNORMAL LOW (ref 3.5–5.0)
BILIRUBIN TOTAL: 0.6 mg/dL (ref 0.3–1.2)
BUN: 37 mg/dL — ABNORMAL HIGH (ref 6–20)
CALCIUM: 11.3 mg/dL — AB (ref 8.9–10.3)
CO2: 32 mmol/L (ref 22–32)
Chloride: 99 mmol/L — ABNORMAL LOW (ref 101–111)
Creatinine, Ser: 2.17 mg/dL — ABNORMAL HIGH (ref 0.61–1.24)
GFR calc non Af Amer: 24 mL/min — ABNORMAL LOW (ref >60)
GFR, EST AFRICAN AMERICAN: 28 mL/min — AB (ref >60)
GLUCOSE: 137 mg/dL — AB (ref 65–99)
Potassium: 3.8 mmol/L (ref 3.5–5.1)
Sodium: 140 mmol/L (ref 135–145)
TOTAL PROTEIN: 6.4 g/dL — AB (ref 6.5–8.1)

## 2017-01-24 LAB — CBC
HCT: 38.6 % — ABNORMAL LOW (ref 39.0–52.0)
HEMOGLOBIN: 13 g/dL (ref 13.0–17.0)
MCH: 34.5 pg — AB (ref 26.0–34.0)
MCHC: 33.7 g/dL (ref 30.0–36.0)
MCV: 102.4 fL — ABNORMAL HIGH (ref 78.0–100.0)
PLATELETS: 158 10*3/uL (ref 150–400)
RBC: 3.77 MIL/uL — AB (ref 4.22–5.81)
RDW: 15.1 % (ref 11.5–15.5)
WBC: 8.7 10*3/uL (ref 4.0–10.5)

## 2017-01-24 LAB — I-STAT CG4 LACTIC ACID, ED: Lactic Acid, Venous: 1.84 mmol/L (ref 0.5–1.9)

## 2017-01-24 MED ORDER — GABAPENTIN 100 MG PO CAPS
200.0000 mg | ORAL_CAPSULE | Freq: Three times a day (TID) | ORAL | Status: DC
  Administered 2017-01-25 – 2017-01-28 (×8): 200 mg via ORAL
  Filled 2017-01-24 (×9): qty 2

## 2017-01-24 MED ORDER — FOLIC ACID 1 MG PO TABS
1.0000 mg | ORAL_TABLET | Freq: Every day | ORAL | Status: DC
  Administered 2017-01-25 – 2017-01-28 (×4): 1 mg via ORAL
  Filled 2017-01-24 (×4): qty 1

## 2017-01-24 MED ORDER — SODIUM CHLORIDE 0.9 % IV SOLN
INTRAVENOUS | Status: DC
  Administered 2017-01-25: 01:00:00 via INTRAVENOUS

## 2017-01-24 MED ORDER — PREDNISONE 5 MG PO TABS
5.0000 mg | ORAL_TABLET | Freq: Every day | ORAL | Status: DC
  Administered 2017-01-25 – 2017-01-28 (×4): 5 mg via ORAL
  Filled 2017-01-24 (×4): qty 1

## 2017-01-24 MED ORDER — BISACODYL 5 MG PO TBEC
5.0000 mg | DELAYED_RELEASE_TABLET | Freq: Every day | ORAL | Status: DC | PRN
Start: 2017-01-24 — End: 2017-01-28

## 2017-01-24 MED ORDER — ADULT MULTIVITAMIN W/MINERALS CH
1.0000 | ORAL_TABLET | Freq: Every day | ORAL | Status: DC
  Administered 2017-01-25 – 2017-01-28 (×4): 1 via ORAL
  Filled 2017-01-24 (×4): qty 1

## 2017-01-24 MED ORDER — CLOPIDOGREL BISULFATE 75 MG PO TABS
75.0000 mg | ORAL_TABLET | Freq: Every day | ORAL | Status: DC
  Administered 2017-01-25 – 2017-01-28 (×4): 75 mg via ORAL
  Filled 2017-01-24 (×4): qty 1

## 2017-01-24 MED ORDER — LEVOTHYROXINE SODIUM 100 MCG PO TABS
100.0000 ug | ORAL_TABLET | Freq: Every day | ORAL | Status: DC
  Administered 2017-01-25 – 2017-01-28 (×4): 100 ug via ORAL
  Filled 2017-01-24 (×4): qty 1

## 2017-01-24 MED ORDER — DEXTROSE 5 % IV SOLN
1.0000 g | Freq: Once | INTRAVENOUS | Status: AC
  Administered 2017-01-24: 1 g via INTRAVENOUS
  Filled 2017-01-24: qty 10

## 2017-01-24 MED ORDER — TRAMADOL HCL 50 MG PO TABS: 50.0000 mg | ORAL_TABLET | Freq: Two times a day (BID) | ORAL | Status: DC | PRN

## 2017-01-24 MED ORDER — DEXTROSE 5 % IV SOLN
1.0000 g | INTRAVENOUS | Status: DC
  Administered 2017-01-25 – 2017-01-26 (×2): 1 g via INTRAVENOUS
  Filled 2017-01-24 (×2): qty 10

## 2017-01-24 MED ORDER — SODIUM CHLORIDE 0.9 % IV BOLUS (SEPSIS)
1000.0000 mL | Freq: Once | INTRAVENOUS | Status: AC
  Administered 2017-01-24: 1000 mL via INTRAVENOUS

## 2017-01-24 MED ORDER — POLYETHYLENE GLYCOL 3350 17 G PO PACK: 17.0000 g | PACK | Freq: Every day | ORAL | Status: DC | PRN

## 2017-01-24 MED ORDER — ENSURE ENLIVE PO LIQD
237.0000 mL | Freq: Two times a day (BID) | ORAL | Status: DC
  Administered 2017-01-25 – 2017-01-28 (×7): 237 mL via ORAL

## 2017-01-24 MED ORDER — ONDANSETRON HCL 4 MG PO TABS: 4.0000 mg | ORAL_TABLET | Freq: Four times a day (QID) | ORAL | Status: DC | PRN

## 2017-01-24 MED ORDER — ONDANSETRON HCL 4 MG/2ML IJ SOLN: 4.0000 mg | Freq: Four times a day (QID) | INTRAMUSCULAR | Status: DC | PRN

## 2017-01-24 MED ORDER — ACETAMINOPHEN 325 MG PO TABS: 650.0000 mg | ORAL_TABLET | Freq: Four times a day (QID) | ORAL | Status: DC | PRN

## 2017-01-24 MED ORDER — ONDANSETRON HCL 4 MG/2ML IJ SOLN: 4.0000 mg | Freq: Three times a day (TID) | INTRAMUSCULAR | Status: DC | PRN

## 2017-01-24 MED ORDER — HEPARIN SODIUM (PORCINE) 5000 UNIT/ML IJ SOLN
5000.0000 [IU] | Freq: Three times a day (TID) | INTRAMUSCULAR | Status: DC
  Administered 2017-01-25 – 2017-01-28 (×12): 5000 [IU] via SUBCUTANEOUS
  Filled 2017-01-24 (×12): qty 1

## 2017-01-24 NOTE — ED Notes (Signed)
Ultrasound at bedside

## 2017-01-24 NOTE — Progress Notes (Signed)
PHARMACY NOTE - Rocephin  Pharmacy has been assisting with dosing of Rocephin for UTI. Dosage remains stable at 1gm q24 and need for further dosage adjustment appears unlikely at present.    Will sign off at this time.  Please reconsult if a change in clinical status warrants re-evaluation of dosage.  Minda Ditto PharmD Pager [phone] 01/24/2017, 9:14 PM

## 2017-01-24 NOTE — H&P (Signed)
History and Physical    Barry Wright KGY:[id number] DOB: 01-18-1920 DOA: 01/24/2017  PCP: Mathews Argyle, MD   Patient coming from: [place]   Chief Complaint: Confusion, no UOP  HPI: Barry Wright is a 83 y.o. male with medical history significant for coronary artery disease, chronic diastolic CHF, chronic kidney disease stage III, hypothyroidism, polymyalgia rheumatica, and chronic sacral ulcer who presents to the emergency department from his nursing facility for evaluation of confusion for the past day and no urine output in close to 24 hours. Patient had reportedly been diagnosed with a urinary tract infection on 01/19/2017 and has been taking ciprofloxacin since that time. He had remained in his usual state until he was noted to be increasingly confused beginning yesterday evening. He is also noted to have not urinated in approximately 24 hours. No fevers have been reported, there has been no recent fall or trauma, and the patient has not voiced any specific complaints aside from his chronic low back pain. Specifically, the patient denies any significant abdominal pain, flank pain, chest pain, or palpitations. He also denies dyspnea or cough. He denies headache, change in vision or hearing, or focal numbness or weakness.   ED Course: Upon arrival to the ED, patient is found to be afebrile, saturating well on room air, and with vital signs stable. Chemistry panels notable for BUN of 37 and creatinine of 2.17, up from prior of 1.3 in July of last year. CBC is notable for a mild macrocytosis without anemia, lactic acid is reassuring at 1.84, and urinalysis is consistent with infection and demonstrates hematuria. Patient was given a liter of normal saline and empiric Rocephin. Urine was sent for culture. Bladder scan was performed with 290 mL, drained with in and out catheterization. Patient has reportedly been urinating on his own after the fluids. He has remained hemodynamically  stable and in no apparent respiratory distress and will be admitted to the medical/surgical unit for ongoing evaluation and management of acute kidney injury superimposed on CKD stage III, likely secondary to UTI with dehydration and retention, and with associated increase in confusion.  Review of Systems:  All other systems reviewed and apart from HPI, are negative.  Past Medical History:  Diagnosis Date  . CAD (coronary artery disease)   . CHF (congestive heart failure) ([place])   . Chronic back pain   . Chronic kidney disease (CKD), stage III (moderate)   . History of nephrectomy   . Hx of CABG   . Hypercholesteremia   . Hypothyroidism   . Internal carotid artery stenosis   . Left bundle branch block   . Myocardial infarction   . Polymyalgia rheumatica ([place])   . Stroke [facility])     Past Surgical History:  Procedure Laterality Date  . CHOLECYSTECTOMY    . CORONARY ARTERY BYPASS GRAFT    . KIDNEY SURGERY     kidney removed     reports that he quit smoking about 46 years ago. His smoking use included Cigarettes. He has never used smokeless tobacco. He reports that he does not drink alcohol or use drugs.  Allergies  Allergen Reactions  . Penicillins Rash    Has patient had a PCN reaction causing immediate rash, facial/tongue/throat swelling, SOB or lightheadedness with hypotension: Unknown Has patient had a PCN reaction causing severe rash involving mucus membranes or skin necrosis: Unknown Has patient had a PCN reaction that required hospitalization: Unknown Has patient had a PCN reaction occurring within the last 10  years: Unknown If all of the above answers are "NO", then may proceed with Cephalosporin use.     Family History  Problem Relation Age of Onset  . Other Mother     [place] Flu  . Heart disease Father      Prior to Admission medications   Medication Sig Start Date End Date Taking? Authorizing Provider  acetaminophen (TYLENOL) 500 MG tablet Take 1,000 mg by  mouth 2 (two) times daily.    Yes Historical Provider, MD  Calcium-Magnesium-Vitamin D (CALCIUM 500 PO) Take 500 mg by mouth daily.   Yes Historical Provider, MD  clopidogrel (PLAVIX) 75 MG tablet Take 75 mg by mouth daily.     Yes Historical Provider, MD  folic acid (FOLVITE) 1 MG tablet Take 1 mg by mouth daily.   Yes Historical Provider, MD  furosemide (LASIX) 20 MG tablet Take 0.5 tablets (10 mg total) by mouth daily. 01/30/15  Yes Theodis Blaze, MD  gabapentin (NEURONTIN) 100 MG capsule Take 200 mg by mouth 3 (three) times daily. Per note: Takes 2 capsules three times/day.   Yes Historical Provider, MD  methotrexate (RHEUMATREX) 2.5 MG tablet Take 10 mg by mouth every Wednesday.   Yes Historical Provider, MD  Multiple Vitamins-Minerals (MULTIVITAMINS THER. W/MINERALS) TABS Take 1 tablet by mouth daily.     Yes Historical Provider, MD  polyethylene glycol (MIRALAX / GLYCOLAX) packet Take 17 g by mouth daily as needed for mild constipation.    Yes Historical Provider, MD  predniSONE (DELTASONE) 5 MG tablet Take 5 mg by mouth daily.   Yes Historical Provider, MD  SYNTHROID 100 MCG tablet Take 100 mcg by mouth daily before breakfast.  09/19/15  Yes Historical Provider, MD  traMADol (ULTRAM) 50 MG tablet Take one tablet by mouth twice daily for pains PRN Patient taking differently: Take 50 mg by mouth 2 (two) times daily.  09/29/15  Yes Belkys A Regalado, MD  feeding supplement, ENSURE ENLIVE, (ENSURE ENLIVE) LIQD Take 237 mLs by mouth 2 (two) times daily between meals. 05/12/16   Jani Gravel, MD    Physical Exam: Vitals:   01/24/17 1916 01/24/17 1923 01/24/17 2000 01/24/17 2030  BP:  134/68 137/66 130/70  Pulse:  78 67 61  Resp:  18 14 16   Temp:  97.6 F (36.4 C)    TempSrc:  Oral    SpO2: 97% 97% 96% 95%      Constitutional: NAD, calm, comfortable. Seborrhea about the face and scalp.  Eyes: PERTLA, lids and conjunctivae normal ENMT: Mucous membranes are moist. Posterior pharynx clear of  any exudate or lesions.   Neck: normal, supple, no masses, no thyromegaly Respiratory: clear to auscultation bilaterally, no wheezing, no crackles. Normal respiratory effort.   Cardiovascular: S1 & S2 heard, regular rate and rhythm, grade 3 systolic murmurs at upper SB and apex. 1+ pretibial edema bilaterally. No significant JVD. Abdomen: No distension, no tenderness, no masses palpated. Bowel sounds normal.  Musculoskeletal: no clubbing / cyanosis. No joint deformity upper and lower extremities.   Skin: Warm, dry, well-perfused. Scattered ecchymoses about the b/l UE's, small skin tear left forearm, seborrheic dermatitis of face, chronic sacral wound. Poor turgor.  Neurologic: CN 2-12 grossly intact. Sensation intact, DTR normal. Strength 5/5 in all 4 limbs.  Psychiatric: Alert and oriented to person, place, and situation, but not oriented to month or year. Normal mood and affect.     Labs on Admission: I have personally reviewed following labs and imaging studies  CBC:  Recent Labs Lab 01/24/17 1932  WBC 8.7  HGB 13.0  HCT 38.6*  MCV 102.4*  PLT 836   Basic Metabolic Panel:  Recent Labs Lab 01/24/17 1932  NA 140  K 3.8  CL 99*  CO2 32  GLUCOSE 137*  BUN 37*  CREATININE 2.17*  CALCIUM 11.3*   GFR: CrCl cannot be calculated (Unknown ideal weight.). Liver Function Tests:  Recent Labs Lab 01/24/17 1932  AST 31  ALT 22  ALKPHOS 64  BILITOT 0.6  PROT 6.4*  ALBUMIN 3.2*   No results for input(s): LIPASE, AMYLASE in the last 168 hours. No results for input(s): AMMONIA in the last 168 hours. Coagulation Profile: No results for input(s): INR, PROTIME in the last 168 hours. Cardiac Enzymes: No results for input(s): CKTOTAL, CKMB, CKMBINDEX, TROPONINI in the last 168 hours. BNP (last 3 results) No results for input(s): PROBNP in the last 8760 hours. HbA1C: No results for input(s): HGBA1C in the last 72 hours. CBG: No results for input(s): GLUCAP in the last 168  hours. Lipid Profile: No results for input(s): CHOL, HDL, LDLCALC, TRIG, CHOLHDL, LDLDIRECT in the last 72 hours. Thyroid Function Tests: No results for input(s): TSH, T4TOTAL, FREET4, T3FREE, THYROIDAB in the last 72 hours. Anemia Panel: No results for input(s): VITAMINB12, FOLATE, FERRITIN, TIBC, IRON, RETICCTPCT in the last 72 hours. Urine analysis:    Component Value Date/Time   COLORURINE YELLOW 01/24/2017 1955   APPEARANCEUR CLOUDY (A) 01/24/2017 1955   LABSPEC 1.009 01/24/2017 1955   PHURINE 6.0 01/24/2017 1955   GLUCOSEU NEGATIVE 01/24/2017 1955   HGBUR MODERATE (A) 01/24/2017 1955   BILIRUBINUR NEGATIVE 01/24/2017 1955   KETONESUR NEGATIVE 01/24/2017 1955   PROTEINUR NEGATIVE 01/24/2017 1955   UROBILINOGEN 0.2 04/11/2015 1437   NITRITE NEGATIVE 01/24/2017 1955   LEUKOCYTESUR LARGE (A) 01/24/2017 1955   Sepsis Labs: @LABRCNTIP (procalcitonin:4,lacticidven:4) )No results found for this or any previous visit (from the past 240 hour(s)).   Radiological Exams on Admission: No results found.  EKG: Not performed, will obtain as appropriate.   Assessment/Plan  1. Acute kidney injury superimposed on CKD stage III  - SCr is 2.17 on admission, up from most recent value of 1.32 in July 2017  - He has been on Cipro for 5 days for UTI and has not had UOP in the day leading up to admission - Suspect a prerenal azotemia in setting of acute UTI, but some concern for obstruction given lack of UOP - In and out cath drained 290 cc on presentation, and has been urinating on his own after IVF  - Plan to exclude obstruction with renal US, clarify etiology with urine studies, continue IVF hydration, hold Lasix, monitor UOP with prn bladder scan and I&O catheter, and repeat chemistries in am    2. Acute encephalopathy   - Mild confusion noted for the day leading up to admission  - No focal deficits on admission; suspected to be toxic-metabolic in setting of UTI and AKI   - Anticipate  resolution with treatment of UTI and kidney injury  3. UTI with urinary retention   - Pt started on ciprofloxacin on 01/19/17 for UTI, culture data not available  - UA on admission consistent with persistent infection and patient is confused  - Remote urine cultures grew Serratia marcescens resistant to cefazolin and nitrofurantoin, and Enterococcus with resistance to fluoroquinolones - There is no leukocytosis or fever and pt is non-toxic in appearance - Urine is sent for culture and he  will be continued on empiric Rocephin   4. Chronic diastolic CHF - Appears intravascularly depleted on admission  - TTE (07/20/14) with EF 45-50%, diffuse HK, mild LVH, mild AS, mild AR, and mild TR  - Managed at the nursing home with Lasix 20 mg qD; this is held on admission in light of apparent dehydration and AKI  - Follow I&O's and daily wts; resume Lasix as needed   5. CAD - Pt s/p remote CABG  - No anginal complaints on admission  - Continue Plavix   6. Polymyalgia rheumatica  - Appears to be stable  - Managed with weekly methotrexate, daily prednisone 5 mg, and folate  - Continue folate and low-dose prednisone   7. Hypothyroidism  - Appears stable, will continue Synthroid    8. Sacral ulcer  - Present on arrival  - Routine wound care   DVT prophylaxis: sq heparin  Code Status: Full  Family Communication: Discussed with patient Disposition Plan: Admit to med-surg Consults called: None Admission status: Inpatient    Vianne Bulls, MD [name] Pager [phone]  If 7PM-7AM, please contact night-coverage [email or web address] Password [facility]  01/24/2017, 9:18 PM

## 2017-01-24 NOTE — ED Triage Notes (Signed)
Pt was brought in from home via EMS. Per ems patient's wife and family informed them that patient was dx with a UTI and started on Cipro Monday. Over the past 24 hours he has had an increase in confusion and no urinary output for almost 18 hours today. Patient is denying any pain other than chronic sacral ulcerations. He has been taking Cipro as ordered, however symptoms do not appear to be resolving and the family was concerned with his increased confusion. VS 140/84, 76, 18, CBG 146. Ems states family has denied any signs of fever.

## 2017-01-24 NOTE — ED Provider Notes (Signed)
[facility] DEPT Provider Note   CSN: [id number] Arrival date & time: 01/24/17  1909     History   Chief Complaint Chief Complaint  Patient presents with  . Altered Mental Status  . Urinary Tract Infection    HPI Barry Wright is a 83 y.o. male.  HPI 83 year old male with past medical history of CK D, CHF, CAD, polymyalgia, who presents with UTI. Patient reportedly was diagnosed with a UTI several days ago. He has been on Cipro for this. Since then, he has had progressively worsening mental status and has not been eating and drinking very much. He has also had decreased urine output. On my assessment, the patient denies any complaints. He states he does feel generally unwell. Remainder of history is limited due to dementia.  Level 5 caveat invoked as remainder of history, ROS, and physical exam limited due to patient's dementia.   Past Medical History:  Diagnosis Date  . CAD (coronary artery disease)   . CHF (congestive heart failure) ([place])   . Chronic back pain   . Chronic kidney disease (CKD), stage III (moderate)   . History of nephrectomy   . Hx of CABG   . Hypercholesteremia   . Hypothyroidism   . Internal carotid artery stenosis   . Left bundle branch block   . Myocardial infarction   . Polymyalgia rheumatica ([place])   . Stroke [facility])     Patient Active Problem List   Diagnosis Date Noted  . Acute lower UTI 02/21/2016  . AKI (acute kidney injury) ([place]) 02/21/2016  . Pressure ulcer 09/29/2015  . Altered mental status   . Chronic back pain 09/26/2015  . Generalized weakness 04/13/2015  . Chronic diastolic heart failure ([place]) 06/19/2014  . Essential hypertension, benign 03/08/2014  . Hypothyroidism 03/08/2014  . Protein-calorie malnutrition, moderate ([place]) 03/08/2014  . Physical deconditioning 03/08/2014  . Abnormal serum protein electrophoresis 01/07/2013  . Chronic kidney disease (CKD), stage III (moderate) 01/03/2013  . Polymyalgia rheumatica ([place])  01/03/2013  . Osteopenia 01/03/2013  . Carotid artery disease ([place]) 01/03/2013  . Spinal stenosis of lumbar region 01/03/2013  . Coronary artery disease 01/03/2013  . Old MI (myocardial infarction) 01/03/2013  . Transient cerebral ischemia 11/18/2011    Past Surgical History:  Procedure Laterality Date  . CHOLECYSTECTOMY    . CORONARY ARTERY BYPASS GRAFT    . KIDNEY SURGERY     kidney removed       Home Medications    Prior to Admission medications   Medication Sig Start Date End Date Taking? Authorizing Provider  acetaminophen (TYLENOL) 500 MG tablet Take 1,000 mg by mouth 2 (two) times daily.    Yes Historical Provider, MD  Calcium-Magnesium-Vitamin D (CALCIUM 500 PO) Take 500 mg by mouth daily.   Yes Historical Provider, MD  clopidogrel (PLAVIX) 75 MG tablet Take 75 mg by mouth daily.     Yes Historical Provider, MD  folic acid (FOLVITE) 1 MG tablet Take 1 mg by mouth daily.   Yes Historical Provider, MD  furosemide (LASIX) 20 MG tablet Take 0.5 tablets (10 mg total) by mouth daily. 01/30/15  Yes Theodis Blaze, MD  gabapentin (NEURONTIN) 100 MG capsule Take 200 mg by mouth 3 (three) times daily. Per note: Takes 2 capsules three times/day.   Yes Historical Provider, MD  methotrexate (RHEUMATREX) 2.5 MG tablet Take 10 mg by mouth every Wednesday.   Yes Historical Provider, MD  Multiple Vitamins-Minerals (MULTIVITAMINS THER. W/MINERALS) TABS Take 1 tablet by  mouth daily.     Yes Historical Provider, MD  polyethylene glycol (MIRALAX / GLYCOLAX) packet Take 17 g by mouth daily as needed for mild constipation.    Yes Historical Provider, MD  predniSONE (DELTASONE) 5 MG tablet Take 5 mg by mouth daily.   Yes Historical Provider, MD  SYNTHROID 100 MCG tablet Take 100 mcg by mouth daily before breakfast.  09/19/15  Yes Historical Provider, MD  traMADol (ULTRAM) 50 MG tablet Take one tablet by mouth twice daily for pains PRN Patient taking differently: Take 50 mg by mouth 2 (two) times  daily.  09/29/15  Yes Belkys A Regalado, MD  feeding supplement, ENSURE ENLIVE, (ENSURE ENLIVE) LIQD Take 237 mLs by mouth 2 (two) times daily between meals. 05/12/16   Jani Gravel, MD    Family History Family History  Problem Relation Age of Onset  . Other Mother     [place] Flu  . Heart disease Father     Social History Social History  Substance Use Topics  . Smoking status: Former Smoker    Types: Cigarettes    Quit date: 11/10/1970  . Smokeless tobacco: Never Used  . Alcohol use No     Allergies   Penicillins   Review of Systems Review of Systems  Unable to perform ROS: Dementia     Physical Exam Updated Vital Signs BP 130/70   Pulse 61   Temp 97.6 F (36.4 C) (Oral)   Resp 16   SpO2 95%   Physical Exam  Constitutional: He appears well-developed and well-nourished. No distress.  HENT:  Head: Normocephalic and atraumatic.  Moderately dry mucous membranes  Eyes: Conjunctivae are normal.  Neck: Neck supple.  Cardiovascular: Normal rate, regular rhythm and normal heart sounds.  Exam reveals no friction rub.   No murmur heard. Pulmonary/Chest: Effort normal and breath sounds normal. No respiratory distress. He has no wheezes. He has no rales.  Abdominal: Soft. Bowel sounds are normal. He exhibits no distension. There is tenderness (Mild, suprapubic, without rebound or guarding). There is no rebound and no guarding.  Musculoskeletal: He exhibits no edema.  Neurological: He is alert. He exhibits normal muscle tone.  Oriented to person only, which is baseline. Moves all extremities with at least antigravity strength. Face is symmetric. Speech is clear.  Skin: Skin is warm. Capillary refill takes less than 2 seconds.  Psychiatric: He has a normal mood and affect.  Nursing note and vitals reviewed.    ED Treatments / Results  Labs (all labs ordered are listed, but only abnormal results are displayed) Labs Reviewed  COMPREHENSIVE METABOLIC PANEL - Abnormal;  Notable for the following:       Result Value   Chloride 99 (*)    Glucose, Bld 137 (*)    BUN 37 (*)    Creatinine, Ser 2.17 (*)    Calcium 11.3 (*)    Total Protein 6.4 (*)    Albumin 3.2 (*)    GFR calc non Af Amer 24 (*)    GFR calc Af Amer 28 (*)    All other components within normal limits  CBC - Abnormal; Notable for the following:    RBC 3.77 (*)    HCT 38.6 (*)    MCV 102.4 (*)    MCH 34.5 (*)    All other components within normal limits  URINALYSIS, ROUTINE W REFLEX MICROSCOPIC - Abnormal; Notable for the following:    APPearance CLOUDY (*)    Hgb urine dipstick  MODERATE (*)    Leukocytes, UA LARGE (*)    Bacteria, UA MANY (*)    Non Squamous Epithelial 0-5 (*)    All other components within normal limits  URINE CULTURE  I-STAT CG4 LACTIC ACID, ED    EKG  EKG Interpretation None       Radiology No results found.  Procedures Procedures (including critical care time)  Medications Ordered in ED Medications  sodium chloride 0.9 % bolus 1,000 mL (1,000 mLs Intravenous New Bag/Given 01/24/17 2046)  cefTRIAXone (ROCEPHIN) 1 g in dextrose 5 % 50 mL IVPB (1 g Intravenous New Bag/Given 01/24/17 2046)     Initial Impression / Assessment and Plan / ED Course  I have reviewed the triage vital signs and the nursing notes.  Pertinent labs & imaging results that were available during my care of the patient were reviewed by me and considered in my medical decision making (see chart for details).     83 year old male with past medical history as above who presents with decreased urine output and increasing confusion in the setting of recently starting ciprofloxacin for UTI. On assessment, patient does appear mildly dehydrated. Labwork shows positive UTI as well as acute on chronic kidney injury, likely prerenal with elevated BUN. His white blood cell count is normal, lactic acid is normal and he has no evidence of sepsis. However, given his AK I, age, and ongoing UTI  despite outpatient Cipro, will admit for IV antibiotics and fluids.  Final Clinical Impressions(s) / ED Diagnoses   Final diagnoses:  AKI (acute kidney injury) ([place])  Lower urinary tract infectious disease    New Prescriptions New Prescriptions   No medications on file     Duffy Bruce, MD 01/24/17 [phone]

## 2017-01-24 NOTE — ED Notes (Signed)
Bladder scan results: 290 mL.

## 2017-01-25 LAB — BASIC METABOLIC PANEL
Anion gap: 8 (ref 5–15)
BUN: 35 mg/dL — AB (ref 6–20)
CALCIUM: 10.6 mg/dL — AB (ref 8.9–10.3)
CHLORIDE: 103 mmol/L (ref 101–111)
CO2: 29 mmol/L (ref 22–32)
CREATININE: 2.09 mg/dL — AB (ref 0.61–1.24)
GFR calc non Af Amer: 26 mL/min — ABNORMAL LOW (ref >60)
GFR, EST AFRICAN AMERICAN: 30 mL/min — AB (ref >60)
GLUCOSE: 103 mg/dL — AB (ref 65–99)
Potassium: 3.5 mmol/L (ref 3.5–5.1)
Sodium: 140 mmol/L (ref 135–145)

## 2017-01-25 LAB — SODIUM, URINE, RANDOM: Sodium, Ur: 77 mmol/L

## 2017-01-25 LAB — CREATININE, URINE, RANDOM: Creatinine, Urine: 39.85 mg/dL

## 2017-01-25 MED ORDER — SODIUM CHLORIDE 0.9 % IV SOLN
INTRAVENOUS | Status: DC
  Administered 2017-01-25 – 2017-01-28 (×5): via INTRAVENOUS

## 2017-01-25 NOTE — Progress Notes (Signed)
Pt confused unable to answer admission assessment questions.

## 2017-01-25 NOTE — Progress Notes (Signed)
Bladder scan done , volume 180 ml.

## 2017-01-25 NOTE — Progress Notes (Signed)
PROGRESS NOTE    Barry Wright  YOV:[id number] DOB: 18-Aug-1920 DOA: 01/24/2017 PCP: Mathews Argyle, MD   Brief Narrative: Barry Wright is a 83 y.o. male with medical history significant for coronary artery disease, chronic diastolic CHF, chronic kidney disease stage III, hypothyroidism, polymyalgia rheumatica, and chronic sacral ulcer who presents to the emergency department from his nursing facility for evaluation of confusion for the past day and no urine output in close to 24 hours. Patient had reportedly been diagnosed with a urinary tract infection on 01/19/2017 and has been taking ciprofloxacin since that time. He had remained in his usual state until he was noted to be increasingly confused beginning yesterday evening. He is also noted to have not urinated in approximately 24 hours. No fevers have been reported, there has been no recent fall or trauma, and the patient has not voiced any specific complaints aside from his chronic low back pain. Specifically, the patient denies any significant abdominal pain, flank pain, chest pain, or palpitations. He also denies dyspnea or cough. He denies headache, change in vision or hearing, or focal numbness or weakness.    Assessment & Plan:   Principal Problem:   AKI (acute kidney injury) ([place]) Active Problems:   Chronic kidney disease (CKD), stage III (moderate)   Polymyalgia rheumatica ([facility])   Coronary artery disease   Essential hypertension, benign   Hypothyroidism   Chronic diastolic heart failure ([facility])   Chronic back pain   Acute encephalopathy   Pressure ulcer   Acute lower UTI   Acute kidney injury superimposed on chronic kidney disease ([place])   Acute urinary retention   1. Acute kidney injury superimposed on CKD stage III  - SCr is 2.17 on admission, up from most recent value of 1.32 in July 2017  - He has been on Cipro for 5 days for UTI and has not had UOP in the day leading up to admission - In and out cath drained  290 cc on presentation, and has been urinating on his own after IVF  - Continue with IV fluids.  -renal  US negative; Normal appearance of the right kidney. No hydronephrosis. Left kidney is surgically absent. Layering debris in the bladder could indicate infection, hemorrhage, or tiny stones. Correlation with urinalysis is recommended. -repeat labs in am.   2. Acute encephalopathy   -related to toxic encephalopathy related to UTI -would avoid ciprofloxacin.   3. UTI with urinary retention   - Pt started on ciprofloxacin on 01/19/17 for UTI, culture data not available  - UA on admission consistent with persistent infection and patient is confused  -bladder scan.  Continue with ceftriaxone.   4. Chronic diastolic CHF - Appears intravascularly depleted on admission  - TTE (07/20/14) with EF 45-50%, diffuse HK, mild LVH, mild AS, mild AR, and mild TR  resume Lasix as needed   5. CAD - Pt s/p remote CABG  - Continue Plavix   6. Polymyalgia rheumatica  - Appears to be stable  - Managed with weekly methotrexate, daily prednisone 5 mg, and folate  - Continue folate and low-dose prednisone   7. Hypothyroidism  - continue Synthroid    8. Sacral ulcer  - Present on arrival  - Routine wound care    DVT prophylaxis: Heparin  Code Status: full code.  Family Communication: none at bedside.  Disposition Plan: Back to nursing home    Consultants:   none   Procedures:   [place];    Antimicrobials:  Ceftriaxone.    Subjective: Patient sleepy this morning. No very conversant.  Per nurse report patient ate lunch.   Objective: Vitals:   01/24/17 2000 01/24/17 2030 01/24/17 2348 01/25/17 0450  BP: 137/66 130/70 (!) 162/63 138/87  Pulse: 67 61 67 93  Resp: 14 16 18 18   Temp:   97.8 F (36.6 C) 98.4 F (36.9 C)  TempSrc:   Oral Oral  SpO2: 96% 95% 93% 95%  Weight:   77 kg (169 lb 12.1 oz)   Height:   6' (1.829 m)     Intake/Output Summary (Last 24 hours) at  01/25/17 1012 Last data filed at 01/25/17 0910  Gross per 24 hour  Intake              480 ml  Output              550 ml  Net              -70 ml   Filed Weights   01/24/17 2348  Weight: 77 kg (169 lb 12.1 oz)    Examination:  General exam: lethargic  Respiratory system: Clear to auscultation. Respiratory effort normal. Cardiovascular system: S1 & S2 heard, RRR. No JVD, murmurs, rubs, gallops or clicks. No pedal edema. Gastrointestinal system: Abdomen is nondistended, soft and nontender. No organomegaly or masses felt. Normal bowel sounds heard. Central nervous system: lethargic  Extremities: Symmetric 5 x 5 power. Skin: No rashes, lesions or ulcers     Data Reviewed: I have personally reviewed following labs and imaging studies  CBC:  Recent Labs Lab 01/24/17 1932  WBC 8.7  HGB 13.0  HCT 38.6*  MCV 102.4*  PLT 703   Basic Metabolic Panel:  Recent Labs Lab 01/24/17 1932 01/25/17 0420  NA 140 140  K 3.8 3.5  CL 99* 103  CO2 32 29  GLUCOSE 137* 103*  BUN 37* 35*  CREATININE 2.17* 2.09*  CALCIUM 11.3* 10.6*   GFR: Estimated Creatinine Clearance: 23.5 mL/min (A) (by C-G formula based on SCr of 2.09 mg/dL (H)). Liver Function Tests:  Recent Labs Lab 01/24/17 1932  AST 31  ALT 22  ALKPHOS 64  BILITOT 0.6  PROT 6.4*  ALBUMIN 3.2*   No results for input(s): LIPASE, AMYLASE in the last 168 hours. No results for input(s): AMMONIA in the last 168 hours. Coagulation Profile: No results for input(s): INR, PROTIME in the last 168 hours. Cardiac Enzymes: No results for input(s): CKTOTAL, CKMB, CKMBINDEX, TROPONINI in the last 168 hours. BNP (last 3 results) No results for input(s): PROBNP in the last 8760 hours. HbA1C: No results for input(s): HGBA1C in the last 72 hours. CBG: No results for input(s): GLUCAP in the last 168 hours. Lipid Profile: No results for input(s): CHOL, HDL, LDLCALC, TRIG, CHOLHDL, LDLDIRECT in the last 72 hours. Thyroid  Function Tests: No results for input(s): TSH, T4TOTAL, FREET4, T3FREE, THYROIDAB in the last 72 hours. Anemia Panel: No results for input(s): VITAMINB12, FOLATE, FERRITIN, TIBC, IRON, RETICCTPCT in the last 72 hours. Sepsis Labs:  Recent Labs Lab 01/24/17 2005  LATICACIDVEN 1.84    No results found for this or any previous visit (from the past 240 hour(s)).       Radiology Studies: US Renal  Result Date: 01/24/2017 CLINICAL DATA:  Acute on chronic kidney disease. Urinary tract infection. Acute encephalopathy. Decreased urine output. Hypertension. EXAM: RENAL / URINARY TRACT ULTRASOUND COMPLETE COMPARISON:  None. FINDINGS: Right Kidney: Length: 12 cm. Mild renal parenchymal thinning  consistent with atrophy. Echogenicity within normal limits. No mass or hydronephrosis visualized. Left Kidney: Left kidney is surgically absent. Bladder: No bladder wall thickening. Small amount of layering debris in the bladder could indicate infection, hemorrhage, or possibly tiny stones. Correlation with urinalysis is recommended. IMPRESSION: Normal appearance of the right kidney. No hydronephrosis. Left kidney is surgically absent. Layering debris in the bladder could indicate infection, hemorrhage, or tiny stones. Correlation with urinalysis is recommended. Electronically Signed   By: Lucienne Capers M.D.   On: 01/24/2017 23:17        Scheduled Meds: . cefTRIAXone (ROCEPHIN)  IV  1 g Intravenous Q24H  . clopidogrel  75 mg Oral Daily  . feeding supplement (ENSURE ENLIVE)  237 mL Oral BID BM  . folic acid  1 mg Oral Daily  . gabapentin  200 mg Oral TID  . heparin  5,000 Units Subcutaneous Q8H  . levothyroxine  100 mcg Oral QAC breakfast  . multivitamin with minerals  1 tablet Oral Daily  . predniSONE  5 mg Oral Daily   Continuous Infusions:   LOS: 1 day    Time spent: 35 minutes.     Elmarie Shiley, MD [name] Pager [phone]  If 7PM-7AM, please contact  night-coverage [email or web address] Password [email or web address] 01/25/2017, 10:12 AM

## 2017-01-26 LAB — CBC
HCT: 34.4 % — ABNORMAL LOW (ref 39.0–52.0)
Hemoglobin: 11.7 g/dL — ABNORMAL LOW (ref 13.0–17.0)
MCH: 34.6 pg — ABNORMAL HIGH (ref 26.0–34.0)
MCHC: 34 g/dL (ref 30.0–36.0)
MCV: 101.8 fL — ABNORMAL HIGH (ref 78.0–100.0)
Platelets: 139 K/uL — ABNORMAL LOW (ref 150–400)
RBC: 3.38 MIL/uL — ABNORMAL LOW (ref 4.22–5.81)
RDW: 15 % (ref 11.5–15.5)
WBC: 7.1 K/uL (ref 4.0–10.5)

## 2017-01-26 LAB — BASIC METABOLIC PANEL
Anion gap: 7 (ref 5–15)
BUN: 33 mg/dL — AB (ref 6–20)
CHLORIDE: 106 mmol/L (ref 101–111)
CO2: 29 mmol/L (ref 22–32)
Calcium: 10.2 mg/dL (ref 8.9–10.3)
Creatinine, Ser: 1.95 mg/dL — ABNORMAL HIGH (ref 0.61–1.24)
GFR calc Af Amer: 32 mL/min — ABNORMAL LOW (ref >60)
GFR calc non Af Amer: 28 mL/min — ABNORMAL LOW (ref >60)
Glucose, Bld: 106 mg/dL — ABNORMAL HIGH (ref 65–99)
POTASSIUM: 3.6 mmol/L (ref 3.5–5.1)
SODIUM: 142 mmol/L (ref 135–145)

## 2017-01-26 LAB — UREA NITROGEN, URINE: Urea Nitrogen, Ur: 263 mg/dL

## 2017-01-26 NOTE — Evaluation (Signed)
Physical Therapy Evaluation Patient Details Name: Barry Wright MRN: [id number] DOB: 04-29-1920 Today's Date: 01/26/2017   History of Present Illness   83 y.o. male with medical history significant for coronary artery disease, chronic diastolic CHF, chronic kidney disease stage III, hypothyroidism, polymyalgia rheumatica, and chronic sacral ulcer who presents to the emergency department from his nursing facility for evaluation of confusion for the past day and no urine output in close to 24 hours. Dx of UTI, encephalopathy, AKI.  Clinical Impression  Pt admitted with above diagnosis. Pt currently with functional limitations due to the deficits listed below (see PT Problem List).  +2 total assist for supine to sit. Pt unable to stand with +2 total assist and RW. Pt sat on EOB x 6 min with assist due to R lateral lean. Performed BLE strengthening exercises in supine. At present needs mechanical lift for transfers, no lift pads available on floor, RN and Network engineer notified.  Pt will benefit from skilled PT to increase their independence and safety with mobility to allow discharge to the venue listed below.       Follow Up Recommendations SNF;Supervision/Assistance - 24 hour    Equipment Recommendations  None recommended by PT    Recommendations for Other Services       Precautions / Restrictions Precautions Precautions: Fall Precaution Comments: wife reports no falls in past 1 year Restrictions Weight Bearing Restrictions: No      Mobility  Bed Mobility Overal bed mobility: Needs Assistance Bed Mobility: Supine to Sit;Sit to Supine     Supine to sit: HOB elevated;Total assist;+2 for physical assistance Sit to supine: +2 for physical assistance;Total assist   General bed mobility comments: pt 10%, assist for trunk and BLEs  Transfers Overall transfer level: Needs assistance Equipment used: Rolling walker (2 wheeled) Transfers: Sit to/from Stand Sit to Stand: +2 physical  assistance;From elevated surface         General transfer comment: +2 sit to stand with RW, VCs hand placement, unable to achieve full upright position, B knees buckled so returned to bed, pt sat on EOB x 6 min with min A 2* R lateral lean  Ambulation/Gait                Stairs            Wheelchair Mobility    Modified Rankin (Stroke Patients Only)       Balance                                             Pertinent Vitals/Pain Pain Assessment: No/denies pain    Home Living Family/patient expects to be discharged to:: Private residence Living Arrangements: Spouse/significant other Available Help at Discharge: Personal care attendant (private caregivers 9:00-1:00 and 5-9:00 daily) Type of Home: House Home Access: Ramped entrance     Home Layout: Able to live on main level with bedroom/bathroom Home Equipment: Walker - 2 wheels;Bedside commode;Shower seat;Grab bars - toilet;Grab bars - tub/shower;Wheelchair - Education administrator (comment) (sliding board)      Prior Function Level of Independence: Needs assistance   Gait / Transfers Assistance Needed: non ambulatory x 2 years, assist to pivot to WC using sliding board or RW  ADL's / Homemaking Assistance Needed: assist with sponge bath, dependent for housekeeping, meal prep        Hand Dominance   Dominant Hand: Right  Extremity/Trunk Assessment   Upper Extremity Assessment Upper Extremity Assessment: Defer to OT evaluation    Lower Extremity Assessment Lower Extremity Assessment: RLE deficits/detail;LLE deficits/detail RLE Deficits / Details: knee ext AROM -45*, -2/5 knee ext, hip 2/5 LLE Deficits / Details: knee ext +1/5, knee ext AROM -45*, hip +1/5    Cervical / Trunk Assessment Cervical / Trunk Assessment: Kyphotic (R lateral lean in sitting (aide reports this is baseline))  Communication   Communication: HOH  Cognition Arousal/Alertness: Lethargic Behavior During Therapy:  WFL for tasks assessed/performed Overall Cognitive Status: Impaired/Different from baseline Area of Impairment: Memory;Following commands       Following Commands: Follows one step commands inconsistently       General Comments: pt could not state birthdate, slow to respond to commands/questions    General Comments      Exercises  Heel slides x 10 AAROM B Ankle pumps x 10 AAROM B Hip ABDuction x 10 AAROM B Short Arc Quads AROM B x 10   Assessment/Plan    PT Assessment Patient needs continued PT services  PT Problem List Decreased strength;Decreased activity tolerance;Decreased cognition;Decreased mobility;Decreased balance       PT Treatment Interventions DME instruction;Functional mobility training;Balance training;Therapeutic exercise;Therapeutic activities;Patient/family education    PT Goals (Current goals can be found in the Care Plan section)  Acute Rehab PT Goals Patient Stated Goal: to walk PT Goal Formulation: With patient/family Time For Goal Achievement: 02/09/17 Potential to Achieve Goals: Fair    Frequency Min 3X/week   Barriers to discharge        Co-evaluation               End of Session Equipment Utilized During Treatment: Gait belt Activity Tolerance: Patient limited by fatigue Patient left: in bed;with call bell/phone within reach;with family/visitor present Nurse Communication: Mobility status;Need for lift equipment PT Visit Diagnosis: Muscle weakness (generalized) (M62.81);Other abnormalities of gait and mobility (R26.89)         Time: 9741-6384 PT Time Calculation (min) (ACUTE ONLY): 34 min   Charges:   PT Evaluation $PT Eval High Complexity: 1 Procedure PT Treatments $Therapeutic Activity: 8-22 mins   PT G Codes:         Philomena Doheny 01/26/2017, 11:01 AM  [phone]

## 2017-01-26 NOTE — Progress Notes (Signed)
PROGRESS NOTE    Barry Wright  OZD:[id number] DOB: November 26, 1919 DOA: 01/24/2017 PCP: Mathews Argyle, MD   Brief Narrative: Barry Wright is a 83 y.o. male with medical history significant for coronary artery disease, chronic diastolic CHF, chronic kidney disease stage III, hypothyroidism, polymyalgia rheumatica, and chronic sacral ulcer who presents to the emergency department from his nursing facility for evaluation of confusion for the past day and no urine output in close to 24 hours. Patient had reportedly been diagnosed with a urinary tract infection on 01/19/2017 and has been taking ciprofloxacin since that time. He had remained in his usual state until he was noted to be increasingly confused beginning yesterday evening. He is also noted to have not urinated in approximately 24 hours. No fevers have been reported, there has been no recent fall or trauma, and the patient has not voiced any specific complaints aside from his chronic low back pain. Specifically, the patient denies any significant abdominal pain, flank pain, chest pain, or palpitations. He also denies dyspnea or cough. He denies headache, change in vision or hearing, or focal numbness or weakness.    Assessment & Plan:   Principal Problem:   AKI (acute kidney injury) ([place]) Active Problems:   Chronic kidney disease (CKD), stage III (moderate)   Polymyalgia rheumatica (HCC)   Coronary artery disease   Essential hypertension, benign   Hypothyroidism   Chronic diastolic heart failure (HCC)   Chronic back pain   Acute encephalopathy   Pressure ulcer   Acute lower UTI   Acute kidney injury superimposed on chronic kidney disease ([place])   Acute urinary retention   1. Acute kidney injury superimposed on CKD stage III  - SCr is 2.17 on admission, up from most recent value of 1.32 in July 2017  - He has been on Cipro for 5 days for UTI and has not had UOP in the day leading up to admission - In and out cath drained  290 cc on presentation, and has been urinating on his own after IVF  - Continue with IV fluids.  -renal  US negative; Normal appearance of the right kidney. No hydronephrosis. Left kidney is surgically absent. Layering debris in the bladder could indicate infection, hemorrhage, or tiny stones. Correlation with urinalysis is recommended. -renal function improving.  Would continue with IV fluids.   2. Acute encephalopathy   -related to toxic encephalopathy related to UTI -would avoid ciprofloxacin.  Improving   3. UTI with urinary retention   - Pt started on ciprofloxacin on 01/19/17 for UTI, culture data not available  - UA on admission consistent with persistent infection and patient is confused  -bladder scan.  -Continue with ceftriaxone.  -urine growing enterococcus fecalis. Follow sensitivity   4. Chronic diastolic CHF - Appears intravascularly depleted on admission  - TTE (07/20/14) with EF 45-50%, diffuse HK, mild LVH, mild AS, mild AR, and mild TR  resume Lasix as needed   5. CAD - Pt s/p remote CABG  - Continue Plavix   6. Polymyalgia rheumatica  - Appears to be stable  - Managed with weekly methotrexate, daily prednisone 5 mg, and folate  - Continue folate and low-dose prednisone   7. Hypothyroidism  - continue Synthroid    8. Sacral ulcer  - Present on arrival  - Routine wound care    DVT prophylaxis: Heparin  Code Status: full code.  Family Communication: wife who was at bedside.  Disposition Plan: Discharge in 24 hours if renal function  improved.    Consultants:   none   Procedures:   [place];    Antimicrobials:   Ceftriaxone.    Subjective: He is more alert this morning.  Denies pain.   Objective: Vitals:   01/25/17 2052 01/26/17 0444 01/26/17 0500 01/26/17 1305  BP: 134/67 110/76  (!) 177/81  Pulse: (!) 56 76  81  Resp: 18 18  16   Temp: 98.3 F (36.8 C) 98.6 F (37 C)  98.3 F (36.8 C)  TempSrc: Axillary Axillary  Oral  SpO2:  98% 98%  95%  Weight:   78 kg (171 lb 15.3 oz)   Height:        Intake/Output Summary (Last 24 hours) at 01/26/17 1518 Last data filed at 01/26/17 1305  Gross per 24 hour  Intake              805 ml  Output             1500 ml  Net             -695 ml   Filed Weights   01/24/17 2348 01/26/17 0500  Weight: 77 kg (169 lb 12.1 oz) 78 kg (171 lb 15.3 oz)    Examination:  General exam: Alert answer questions.  Respiratory system: Clear to auscultation. Respiratory effort normal. Cardiovascular system: S1 & S2 heard, RRR. No JVD, murmurs, rubs, gallops or clicks. No pedal edema. Gastrointestinal system: Abdomen is nondistended, soft and nontender. No organomegaly or masses felt. Normal bowel sounds heard. Extremities: Symmetric 5 x 5 power. Skin: No rashes, lesions or ulcers     Data Reviewed: I have personally reviewed following labs and imaging studies  CBC:  Recent Labs Lab 01/24/17 1932 01/26/17 0357  WBC 8.7 7.1  HGB 13.0 11.7*  HCT 38.6* 34.4*  MCV 102.4* 101.8*  PLT 158 923*   Basic Metabolic Panel:  Recent Labs Lab 01/24/17 1932 01/25/17 0420 01/26/17 0357  NA 140 140 142  K 3.8 3.5 3.6  CL 99* 103 106  CO2 32 29 29  GLUCOSE 137* 103* 106*  BUN 37* 35* 33*  CREATININE 2.17* 2.09* 1.95*  CALCIUM 11.3* 10.6* 10.2   GFR: Estimated Creatinine Clearance: 25.4 mL/min (A) (by C-G formula based on SCr of 1.95 mg/dL (H)). Liver Function Tests:  Recent Labs Lab 01/24/17 1932  AST 31  ALT 22  ALKPHOS 64  BILITOT 0.6  PROT 6.4*  ALBUMIN 3.2*   No results for input(s): LIPASE, AMYLASE in the last 168 hours. No results for input(s): AMMONIA in the last 168 hours. Coagulation Profile: No results for input(s): INR, PROTIME in the last 168 hours. Cardiac Enzymes: No results for input(s): CKTOTAL, CKMB, CKMBINDEX, TROPONINI in the last 168 hours. BNP (last 3 results) No results for input(s): PROBNP in the last 8760 hours. HbA1C: No results for  input(s): HGBA1C in the last 72 hours. CBG: No results for input(s): GLUCAP in the last 168 hours. Lipid Profile: No results for input(s): CHOL, HDL, LDLCALC, TRIG, CHOLHDL, LDLDIRECT in the last 72 hours. Thyroid Function Tests: No results for input(s): TSH, T4TOTAL, FREET4, T3FREE, THYROIDAB in the last 72 hours. Anemia Panel: No results for input(s): VITAMINB12, FOLATE, FERRITIN, TIBC, IRON, RETICCTPCT in the last 72 hours. Sepsis Labs:  Recent Labs Lab 01/24/17 2005  LATICACIDVEN 1.84    Recent Results (from the past 240 hour(s))  Urine culture     Status: Abnormal (Preliminary result)   Collection Time: 01/24/17  7:55 PM  Result  Value Ref Range Status   Specimen Description URINE, CLEAN CATCH  Final   Special Requests NONE  Final   Culture >=100,000 COLONIES/mL ENTEROCOCCUS FAECALIS (A)  Final   Report Status PENDING  Incomplete         Radiology Studies: US Renal  Result Date: 01/24/2017 CLINICAL DATA:  Acute on chronic kidney disease. Urinary tract infection. Acute encephalopathy. Decreased urine output. Hypertension. EXAM: RENAL / URINARY TRACT ULTRASOUND COMPLETE COMPARISON:  None. FINDINGS: Right Kidney: Length: 12 cm. Mild renal parenchymal thinning consistent with atrophy. Echogenicity within normal limits. No mass or hydronephrosis visualized. Left Kidney: Left kidney is surgically absent. Bladder: No bladder wall thickening. Small amount of layering debris in the bladder could indicate infection, hemorrhage, or possibly tiny stones. Correlation with urinalysis is recommended. IMPRESSION: Normal appearance of the right kidney. No hydronephrosis. Left kidney is surgically absent. Layering debris in the bladder could indicate infection, hemorrhage, or tiny stones. Correlation with urinalysis is recommended. Electronically Signed   By: Lucienne Capers M.D.   On: 01/24/2017 23:17        Scheduled Meds: . cefTRIAXone (ROCEPHIN)  IV  1 g Intravenous Q24H  .  clopidogrel  75 mg Oral Daily  . feeding supplement (ENSURE ENLIVE)  237 mL Oral BID BM  . folic acid  1 mg Oral Daily  . gabapentin  200 mg Oral TID  . heparin  5,000 Units Subcutaneous Q8H  . levothyroxine  100 mcg Oral QAC breakfast  . multivitamin with minerals  1 tablet Oral Daily  . predniSONE  5 mg Oral Daily   Continuous Infusions: . sodium chloride 75 mL/hr at 01/25/17 1400     LOS: 2 days    Time spent: 35 minutes.     Elmarie Shiley, MD [name] Pager [phone]  If 7PM-7AM, please contact night-coverage [email or web address] Password [facility] 01/26/2017, 3:18 PM

## 2017-01-26 NOTE — Evaluation (Signed)
Occupational Therapy Evaluation Patient Details Name: Barry Wright MRN: [id number] DOB: 11-11-1919 Today's Date: 01/26/2017    History of Present Illness  83 y.o. male with medical history significant for coronary artery disease, chronic diastolic CHF, chronic kidney disease stage III, hypothyroidism, polymyalgia rheumatica, and chronic sacral ulcer who presents to the emergency department from his nursing facility for evaluation of confusion for the past day and no urine output in close to 24 hours. Dx of UTI, encephalopathy, AKI.   Clinical Impression   Pt admitted with confusion. Pt currently with functional limitations due to the deficits listed below (see OT Problem List).  Pt will benefit from skilled OT to increase their safety and independence with ADL and functional mobility for ADL to facilitate discharge to venue listed below.      Follow Up Recommendations  SNF          Precautions / Restrictions Precautions Precautions: Fall Precaution Comments: wife reports no falls in past 1 year Restrictions Weight Bearing Restrictions: No      Mobility Bed Mobility Overal bed mobility: Needs Assistance Bed Mobility: Rolling Rolling: +2 for physical assistance;+2 for safety/equipment;Max assist   Supine to sit: +2 for safety/equipment;+2 for physical assistance;Max assist        Transfers              NT             ADL Overall ADL's : Needs assistance/impaired Eating/Feeding: Moderate assistance;Bed level   Grooming: Minimal assistance;Bed level                   Toilet Transfer: Maximal assistance;+2 for physical assistance;+2 for safety/equipment Toilet Transfer Details (indicate cue type and reason): bed level - rolling L and R Toileting- Clothing Manipulation and Hygiene: +2 for physical assistance;Cueing for sequencing;Total assistance Toileting - Clothing Manipulation Details (indicate cue type and reason): rolling L and R                           Pertinent Vitals/Pain Pain Assessment: No/denies pain     Hand Dominance Right   Extremity/Trunk Assessment Upper Extremity Assessment Upper Extremity Assessment: Generalized weakness           Communication Communication Communication: HOH   Cognition Arousal/Alertness: Awake/alert Behavior During Therapy: WFL for tasks assessed/performed Overall Cognitive Status: Within Functional Limits for tasks assessed                     General Comments   pt had had a BM in bed.  Pt not aware of this.            Home Living Family/patient expects to be discharged to:: Skilled nursing facility Living Arrangements: Spouse/significant other Available Help at Discharge: Personal care attendant (private caregivers 9:00-1:00 and 5-9:00 daily) Type of Home: House Home Access: Ramped entrance     Home Layout: Able to live on main level with bedroom/bathroom     Bathroom Shower/Tub: Tub/shower unit         Home Equipment: Environmental consultant - 2 wheels;Bedside commode;Shower seat;Grab bars - toilet;Grab bars - tub/shower;Wheelchair - Education administrator (comment) (sliding board)          Prior Functioning/Environment Level of Independence: Needs assistance  Gait / Transfers Assistance Needed: non ambulatory x 2 years, assist to pivot to WC using sliding board or RW ADL's / Homemaking Assistance Needed: assist with sponge bath, dependent for housekeeping, meal prep  OT Problem List: Decreased strength;Decreased activity tolerance      OT Treatment/Interventions: Self-care/ADL training;DME and/or AE instruction;Patient/family education    OT Goals(Current goals can be found in the care plan section) Acute Rehab OT Goals Patient Stated Goal: to walk OT Goal Formulation: With patient Time For Goal Achievement: 02/02/17 Potential to Achieve Goals: Good  OT Frequency: Min 2X/week   Barriers to D/C:               End of Session Nurse  Communication: Mobility status  Activity Tolerance: Patient tolerated treatment well Patient left: in chair  OT Visit Diagnosis: Muscle weakness (generalized) (M62.81)                ADL either performed or assessed with clinical judgement  Time: 1448-1856 OT Time Calculation (min): 45 min Charges:  OT General Charges $OT Visit: 1 Procedure OT Evaluation $OT Eval Moderate Complexity: 1 Procedure OT Treatments $Self Care/Home Management : 8-22 mins G-Codes:     Kari Baars, OT [phone]  Payton Mccallum D 01/26/2017, 5:05 PM

## 2017-01-27 LAB — BASIC METABOLIC PANEL
ANION GAP: 8 (ref 5–15)
BUN: 35 mg/dL — ABNORMAL HIGH (ref 6–20)
CALCIUM: 9.6 mg/dL (ref 8.9–10.3)
CO2: 26 mmol/L (ref 22–32)
CREATININE: 1.77 mg/dL — AB (ref 0.61–1.24)
Chloride: 110 mmol/L (ref 101–111)
GFR, EST AFRICAN AMERICAN: 36 mL/min — AB (ref >60)
GFR, EST NON AFRICAN AMERICAN: 31 mL/min — AB (ref >60)
Glucose, Bld: 100 mg/dL — ABNORMAL HIGH (ref 65–99)
Potassium: 3.3 mmol/L — ABNORMAL LOW (ref 3.5–5.1)
Sodium: 144 mmol/L (ref 135–145)

## 2017-01-27 LAB — URINE CULTURE

## 2017-01-27 LAB — GLUCOSE, CAPILLARY: Glucose-Capillary: 90 mg/dL (ref 65–99)

## 2017-01-27 MED ORDER — FOSFOMYCIN TROMETHAMINE 3 G PO PACK
3.0000 g | PACK | Freq: Once | ORAL | Status: AC
  Administered 2017-01-27: 3 g via ORAL
  Filled 2017-01-27: qty 3

## 2017-01-27 NOTE — Progress Notes (Addendum)
PROGRESS NOTE    Barry Wright  TGY:[id number] DOB: July 15, 1920 DOA: 01/24/2017 PCP: Mathews Argyle, MD   Brief Narrative: Barry Wright is a 83 y.o. Barry with medical history significant for coronary artery disease, chronic diastolic CHF, chronic kidney disease stage III, hypothyroidism, polymyalgia rheumatica, and chronic sacral ulcer who presents to the emergency department from his nursing facility for evaluation of confusion for the past day and no urine output in close to 24 hours. Patient had reportedly been diagnosed with a urinary tract infection on 01/19/2017 and has been taking ciprofloxacin since that time. He had remained in his usual state until he was noted to be increasingly confused beginning yesterday evening. He is also noted to have not urinated in approximately 24 hours. No fevers have been reported, there has been no recent fall or trauma, and the patient has not voiced any specific complaints aside from his chronic low back pain. Specifically, the patient denies any significant abdominal pain, flank pain, chest pain, or palpitations. He also denies dyspnea or cough. He denies headache, change in vision or hearing, or focal numbness or weakness.  Patient admitted with AKI and enterococcus UTI. Renal function has been improving with IV fluids and treatment of infection. He has poor oral intake, will continue with IV fluids for 24 hours. Plan to discharge in 24 hours if renal function is better.     Assessment & Plan:   Principal Problem:   AKI (acute kidney injury) ([place]) Active Problems:   Chronic kidney disease (CKD), stage III (moderate)   Polymyalgia rheumatica (HCC)   Coronary artery disease   Essential hypertension, benign   Hypothyroidism   Chronic diastolic heart failure (H[facility])   Chronic back pain   Acute encephalopathy   Pressure ulcer   Acute lower UTI   Acute kidney injury superimposed on chronic kidney disease ([place])   Acute urinary  retention   1. Acute kidney injury superimposed on CKD stage III  - SCr is 2.17 on admission, up from most recent value of 1.32 in July 2017  - He has been on Cipro for 5 days for UTI and has not had UOP in the day leading up to admission - In and out cath drained 290 cc on presentation, and has been urinating on his own after IVF  - Continue with IV fluids.  -renal  US negative; Normal appearance of the right kidney. No hydronephrosis. Left kidney is surgically absent. Layering debris in the bladder could indicate infection, hemorrhage, or tiny stones. Correlation with urinalysis is recommended. -renal function improving.  Would continue with IV fluids, patient with poor oral intake.   2. Acute encephalopathy   -related to toxic encephalopathy related to UTI -would avoid ciprofloxacin.  Improving   3. UTI with urinary retention   - Pt started on ciprofloxacin on 01/19/17 for UTI, culture data not available  - UA on admission consistent with persistent infection and patient is confused  -bladder scan.  -Continue with ceftriaxone.  -urine growing enterococcus fecalis, sensitive to ampicillin but patient has allergies to penicillin. Due to renal function would avoid nitrofurantoin.  -will give one time dose fosfomycin and he will need repeat dose in 2 day at discharge./   4. Chronic diastolic CHF - Appears intravascularly depleted on admission  - TTE (07/20/14) with EF 45-50%, diffuse HK, mild LVH, mild AS, mild AR, and mild TR  resume Lasix as needed   5. CAD - Pt s/p remote CABG  - Continue Plavix  6. Polymyalgia rheumatica  - Appears to be stable  - Managed with weekly methotrexate, daily prednisone 5 mg, and folate  - Continue folate and low-dose prednisone   7. Hypothyroidism  - continue Synthroid      DVT prophylaxis: Heparin  Code Status: full code.  Family Communication: wife who was at bedside 3-19 Disposition Plan: Discharge in 24 hours if renal function  improved.    Consultants:   none   Procedures:   [place];    Antimicrobials:   Ceftriaxone.    Subjective: He is alert this morning. No complaints.  He has been only eating 50 % meals   Objective: Vitals:   01/26/17 0500 01/26/17 1305 01/26/17 2235 01/27/17 0536  BP:  (!) 177/81 (!) 169/84 (!) 161/69  Pulse:  81 80 73  Resp:  16 16 16   Temp:  98.3 F (36.8 C) 97.9 F (36.6 C) 99.1 F (37.3 C)  TempSrc:  Oral Oral Oral  SpO2:  95% 97% 97%  Weight: 78 kg (171 lb 15.3 oz)   79.8 kg (175 lb 14.8 oz)  Height:        Intake/Output Summary (Last 24 hours) at 01/27/17 1007 Last data filed at 01/27/17 0815  Gross per 24 hour  Intake             1380 ml  Output             1550 ml  Net             -170 ml   Filed Weights   01/24/17 2348 01/26/17 0500 01/27/17 0536  Weight: 77 kg (169 lb 12.1 oz) 78 kg (171 lb 15.3 oz) 79.8 kg (175 lb 14.8 oz)    Examination:  General exam: Alert answer questions.  Respiratory system: Clear to auscultation. Respiratory effort normal. Cardiovascular system: S1 & S2 heard, RRR. No JVD, murmurs, rubs, gallops or clicks. No pedal edema. Gastrointestinal system: Abdomen is nondistended, soft and nontender. No organomegaly or masses felt. Normal bowel sounds heard. Extremities: Symmetric 5 x 5 power. Skin: No rashes, lesions or ulcers     Data Reviewed: I have personally reviewed following labs and imaging studies  CBC:  Recent Labs Lab 01/24/17 1932 01/26/17 0357  WBC 8.7 7.1  HGB 13.0 11.7*  HCT 38.6* 34.4*  MCV 102.4* 101.8*  PLT 158 330*   Basic Metabolic Panel:  Recent Labs Lab 01/24/17 1932 01/25/17 0420 01/26/17 0357 01/27/17 0436  NA 140 140 142 144  K 3.8 3.5 3.6 3.3*  CL 99* 103 106 110  CO2 32 29 29 26   GLUCOSE 137* 103* 106* 100*  BUN 37* 35* 33* 35*  CREATININE 2.17* 2.09* 1.95* 1.77*  CALCIUM 11.3* 10.6* 10.2 9.6   GFR: Estimated Creatinine Clearance: 28 mL/min (A) (by C-G formula based on SCr of  1.77 mg/dL (H)). Liver Function Tests:  Recent Labs Lab 01/24/17 1932  AST 31  ALT 22  ALKPHOS 64  BILITOT 0.6  PROT 6.4*  ALBUMIN 3.2*   No results for input(s): LIPASE, AMYLASE in the last 168 hours. No results for input(s): AMMONIA in the last 168 hours. Coagulation Profile: No results for input(s): INR, PROTIME in the last 168 hours. Cardiac Enzymes: No results for input(s): CKTOTAL, CKMB, CKMBINDEX, TROPONINI in the last 168 hours. BNP (last 3 results) No results for input(s): PROBNP in the last 8760 hours. HbA1C: No results for input(s): HGBA1C in the last 72 hours. CBG:  Recent Labs Lab 01/27/17 [phone]  GLUCAP 90   Lipid Profile: No results for input(s): CHOL, HDL, LDLCALC, TRIG, CHOLHDL, LDLDIRECT in the last 72 hours. Thyroid Function Tests: No results for input(s): TSH, T4TOTAL, FREET4, T3FREE, THYROIDAB in the last 72 hours. Anemia Panel: No results for input(s): VITAMINB12, FOLATE, FERRITIN, TIBC, IRON, RETICCTPCT in the last 72 hours. Sepsis Labs:  Recent Labs Lab 01/24/17 2005  LATICACIDVEN 1.84    Recent Results (from the past 240 hour(s))  Urine culture     Status: Abnormal   Collection Time: 01/24/17  7:55 PM  Result Value Ref Range Status   Specimen Description URINE, CLEAN CATCH  Final   Special Requests NONE  Final   Culture >=100,000 COLONIES/mL ENTEROCOCCUS FAECALIS (A)  Final   Report Status 01/27/2017 FINAL  Final   Organism ID, Bacteria ENTEROCOCCUS FAECALIS (A)  Final      Susceptibility   Enterococcus faecalis - MIC*    AMPICILLIN <=2 SENSITIVE Sensitive     LEVOFLOXACIN >=8 RESISTANT Resistant     NITROFURANTOIN <=16 SENSITIVE Sensitive     VANCOMYCIN 1 SENSITIVE Sensitive     * >=100,000 COLONIES/mL ENTEROCOCCUS FAECALIS         Radiology Studies: No results found.      Scheduled Meds: . clopidogrel  75 mg Oral Daily  . feeding supplement (ENSURE ENLIVE)  237 mL Oral BID BM  . folic acid  1 mg Oral Daily  .  fosfomycin  3 g Oral Once  . gabapentin  200 mg Oral TID  . heparin  5,000 Units Subcutaneous Q8H  . levothyroxine  100 mcg Oral QAC breakfast  . multivitamin with minerals  1 tablet Oral Daily  . predniSONE  5 mg Oral Daily   Continuous Infusions: . sodium chloride 100 mL/hr at 01/27/17 0725     LOS: 3 days    Time spent: 35 minutes.     Elmarie Shiley, MD [name] Pager [phone]  If 7PM-7AM, please contact night-coverage [email or web address] Password [email or web address] 01/27/2017, 10:07 AM

## 2017-01-27 NOTE — Consult Note (Signed)
Consultation Note Date: 01/27/2017   Patient Name: Barry Wright  DOB: [birth year]  MRN: [id number]  Age / Sex: 83 y.o., male  PCP: Lajean Manes, MD Referring Physician: Elmarie Shiley, MD  Reason for Consultation: goals of care discussions, assistance with disposition.   HPI/Patient Profile: 83 y.o. male   admitted on 01/24/2017   Yerachmiel Spinney Sileris a 83 y.o.malewith medical history significant forcoronary artery disease, chronic diastolic CHF, chronic kidney disease stage III, hypothyroidism, polymyalgia rheumatica, and chronic sacral ulcer who presents to the emergency department from his nursing facility for evaluation of confusion for the past day and no urine output in close to 24 hours. Patient had reportedly been diagnosed with a urinary tract infection on 01/19/2017 and has been taking ciprofloxacin since that time. He had remained in his usual state until he was noted to be increasingly confused beginning yesterday evening. He is also noted to have not urinated in approximately 24 hours. No fevers have been reported, there has been no recent fall or trauma, and the patient has not voiced any specific complaints aside from his chronic low back pain. Specifically, the patient denies any significant abdominal pain, flank pain, chest pain, or palpitations. He also denies dyspnea or cough. He denies headache, change in vision or hearing, or focal numbness or weakness.  Patient admitted with AKI and enterococcus UTI. Renal function has been improving with IV fluids and treatment of infection. He has poor oral intake, will continue with IV fluids for 24 hours. Plan to discharge in 24 hours if renal function is better Clinical Assessment and Goals of Care: Patient is resting in bed, discussed with wife at the bedside, introduced myself and palliative care as follows: Palliative medicine is specialized  medical care for people living with serious illness. It focuses on providing relief from the symptoms and stress of a serious illness. The goal is to improve quality of life for both the patient and the family.  Patient's wife states that they have been together since she was 83 years old. She would like to take him home if possible, how ever, is also seeking more information about SNF services. We discussed about hospice services and how they might be of assistance.   Code status discussions undertaken in detail, the patient does not have a living will, but he has expressed wishes to not be kept alive by artificial means. Hence, after all questions were answered to the best of my ability, DNR DNI has now been established.   See below  NEXT OF KIN  Wife  Has 2 daughters     SUMMARY OF RECOMMENDATIONS   Code status now clarified as DNR DNI Continue to explore options for home with hospice versus SNF rehab with palliative to follow Patient with advanced age, multiple morbidities, gradual decline, but doubt that his prognosis is less than 2 weeks at this point in time. We will continue to follow along and help guide appropriate decision making and disposition planning, thank you for the consult.  Code  Status/Advance Care Planning:  DNR    Symptom Management:  As above    Palliative Prophylaxis:   Bowel Regimen  Psycho-social/Spiritual:   Desire for further Chaplaincy support:no  Additional Recommendations: Caregiving  Support/Resources  Prognosis:   Unable to determine  Discharge Planning: To Be Determined      Primary Diagnoses: Present on Admission: . Acute lower UTI . Polymyalgia rheumatica ([place]) . Hypothyroidism . Chronic back pain . Chronic diastolic heart failure ([place]) . Coronary artery disease . AKI (acute kidney injury) ([place]) . Chronic kidney disease (CKD), stage III (moderate) . Essential hypertension, benign . Acute kidney injury superimposed on chronic  kidney disease ([place]) . Pressure ulcer . Acute encephalopathy . Acute urinary retention   I have reviewed the medical record, interviewed the patient and family, and examined the patient. The following aspects are pertinent.  Past Medical History:  Diagnosis Date  . CAD (coronary artery disease)   . CHF (congestive heart failure) ([place])   . Chronic back pain   . Chronic kidney disease (CKD), stage III (moderate)   . History of nephrectomy   . Hx of CABG   . Hypercholesteremia   . Hypothyroidism   . Internal carotid artery stenosis   . Left bundle branch block   . Myocardial infarction   . Polymyalgia rheumatica ([place])   . Stroke [facility])    Social History   Social History  . Marital status: Married    Spouse name: N/A  . Number of children: N/A  . Years of education: N/A   Social History Main Topics  . Smoking status: Former Smoker    Types: Cigarettes    Quit date: 11/10/1970  . Smokeless tobacco: Never Used  . Alcohol use No  . Drug use: No  . Sexual activity: No   Other Topics Concern  . None   Social History Narrative  . None   Family History  Problem Relation Age of Onset  . Other Mother     [place] Flu  . Heart disease Father    Scheduled Meds: . clopidogrel  75 mg Oral Daily  . feeding supplement (ENSURE ENLIVE)  237 mL Oral BID BM  . folic acid  1 mg Oral Daily  . gabapentin  200 mg Oral TID  . heparin  5,000 Units Subcutaneous Q8H  . levothyroxine  100 mcg Oral QAC breakfast  . multivitamin with minerals  1 tablet Oral Daily  . predniSONE  5 mg Oral Daily   Continuous Infusions: . sodium chloride 75 mL/hr at 01/27/17 1011   PRN Meds:.acetaminophen, bisacodyl, ondansetron **OR** ondansetron (ZOFRAN) IV, polyethylene glycol, traMADol Medications Prior to Admission:  Prior to Admission medications   Medication Sig Start Date End Date Taking? Authorizing Provider  acetaminophen (TYLENOL) 500 MG tablet Take 1,000 mg by mouth 2 (two) times daily.     Yes Historical Provider, MD  Calcium-Magnesium-Vitamin D (CALCIUM 500 PO) Take 500 mg by mouth daily.   Yes Historical Provider, MD  clopidogrel (PLAVIX) 75 MG tablet Take 75 mg by mouth daily.     Yes Historical Provider, MD  folic acid (FOLVITE) 1 MG tablet Take 1 mg by mouth daily.   Yes Historical Provider, MD  furosemide (LASIX) 20 MG tablet Take 0.5 tablets (10 mg total) by mouth daily. 01/30/15  Yes Theodis Blaze, MD  gabapentin (NEURONTIN) 100 MG capsule Take 200 mg by mouth 3 (three) times daily. Per note: Takes 2 capsules three times/day.   Yes Historical Provider,  MD  methotrexate (RHEUMATREX) 2.5 MG tablet Take 10 mg by mouth every Wednesday.   Yes Historical Provider, MD  Multiple Vitamins-Minerals (MULTIVITAMINS THER. W/MINERALS) TABS Take 1 tablet by mouth daily.     Yes Historical Provider, MD  polyethylene glycol (MIRALAX / GLYCOLAX) packet Take 17 g by mouth daily as needed for mild constipation.    Yes Historical Provider, MD  predniSONE (DELTASONE) 5 MG tablet Take 5 mg by mouth daily.   Yes Historical Provider, MD  SYNTHROID 100 MCG tablet Take 100 mcg by mouth daily before breakfast.  09/19/15  Yes Historical Provider, MD  traMADol (ULTRAM) 50 MG tablet Take one tablet by mouth twice daily for pains PRN Patient taking differently: Take 50 mg by mouth 2 (two) times daily.  09/29/15  Yes Belkys A Regalado, MD  feeding supplement, ENSURE ENLIVE, (ENSURE ENLIVE) LIQD Take 237 mLs by mouth 2 (two) times daily between meals. 05/12/16   Jani Gravel, MD   Allergies  Allergen Reactions  . Penicillins Rash    Has patient had a PCN reaction causing immediate rash, facial/tongue/throat swelling, SOB or lightheadedness with hypotension: Unknown Has patient had a PCN reaction causing severe rash involving mucus membranes or skin necrosis: Unknown Has patient had a PCN reaction that required hospitalization: Unknown Has patient had a PCN reaction occurring within the last 10 years: Unknown If  all of the above answers are "NO", then may proceed with Cephalosporin use.    Review of Systems + for weakness   Physical Exam NAD Clear breath sounds anterior fields s1 s2 Abdomen soft No edema  Vital Signs: BP (!) 166/73 (BP Location: Left Arm)   Pulse 65   Temp 98.6 F (37 C) (Oral)   Resp 16   Ht 6' (1.829 m)   Wt 79.8 kg (175 lb 14.8 oz)   SpO2 96%   BMI 23.86 kg/m  Pain Assessment: No/denies pain       SpO2: SpO2: 96 % O2 Device:SpO2: 96 % O2 Flow Rate: .   IO: Intake/output summary:  Intake/Output Summary (Last 24 hours) at 01/27/17 1724 Last data filed at 01/27/17 1426  Gross per 24 hour  Intake           964.58 ml  Output              950 ml  Net            14.58 ml    LBM: Last BM Date:  (pta) Baseline Weight: Weight: 77 kg (169 lb 12.1 oz) Most recent weight: Weight: 79.8 kg (175 lb 14.8 oz)     Palliative Assessment/Data:     Time In:  1600 Time Out: 1700 Time Total: 60  Greater than 50%  of this time was spent counseling and coordinating care related to the above assessment and plan.  Signed by: Loistine Chance, MD  [phone]  Please contact Palliative Medicine Team phone at [phone] for questions and concerns.  For individual provider: See Shea Evans

## 2017-01-27 NOTE — Clinical Social Work Note (Signed)
Clinical Social Work Assessment  Patient Details  Name: Barry Wright MRN: [id number] Date of Birth: 25-Jun-1920  Date of referral:  01/27/17               Reason for consult:  Facility Placement, Discharge Planning                Permission sought to share information with:  Case Manager, Customer service manager, Family Supports Permission granted to share information::  Yes, Verbal Permission Granted  Name::        Agency::  [facility] agency  Relationship::  wife at bedside  Contact Information:     Housing/Transportation Living arrangements for the past 2 months:  [place] of Information:  Medical Team, Case Manager, Spouse, Other (Comment Required) (community agency) Patient Interpreter Needed:  None Criminal Activity/Legal Involvement Pertinent to Current Situation/Hospitalization:  No - Comment as needed Significant Relationships:  Spouse, Warehouse manager Lives with:  Spouse Do you feel safe going back to the place where you live?  Yes Need for family participation in patient care:  Yes (Comment)  Care giving concerns:  LCSW met with wife at bedside along with community provider attempting to feed patient. Patient and wife have been married for 14 years and known one another since they were 70 years old.  Wife reports she is unable to do much for patient, but loves him very much and wants him home. Reports he too wants to be home.  Patient is a total care patient, uses a lift at home and requires private agency that comes into the home to provide care that spouse is paying out of pocket. Reports they have 2 adult daughters but one lives in [place] and the other is in town and is the POA who manges the finances.  Both wife and care giver report she is not around very much.    Wife reports she understands he needs more care than she can give, but she cannot go into a facility and she knows patient does not want to go into a facility and  wants him home.  LCSW explained SNF and rehab stay, but patient would not benefit at this time as he is total care and insurance will not justify the coverage in which they cannot afford out of pocket.   Social Worker assessment / plan:  LCSW completed consult for SNF placement/assessment. Wife wants to go home, relies on community agency for support during the day and night along with transportation. Patient has a [place] in consult pending and to be completed.  Wife reports ultimately she wants to be home with him and is very emotional regarding situations. She is not looking for LTC at this time.   It is known that patient would benefit from 24 hour care, however unclear if this is possible or the wishes of wife.  Will need guidance and understanding from palliative team regarding prognosis of patient and assistance with helping wife understand.  Employment status:  Retired Forensic scientist:  [employer] PT Recommendations:  [place] / Referral to community resources:  [place]  Patient/Family's Response to care:  Understading  Patient/Family's Understanding of and Emotional Response to Diagnosis, Current Treatment, and Prognosis:  Wife is very emotional and tearful regarding the situation. She leads with her heart and wants to continue caring for patient but starting to understand she cannot do so alone or possibly safely in the home.    Emotional Assessment  Appearance:  Appears stated age Attitude/Demeanor/Rapport:    Affect (typically observed):  Pleasant Orientation:  Oriented to Self Alcohol / Substance use:  Not Applicable Psych involvement (Current and /or in the community):  No (Comment)  Discharge Needs  Concerns to be addressed:  Coping/Stress Concerns, Basic Needs Readmission within the last 30 days:  No Current discharge risk:  None Barriers to Discharge:  Continued Medical Work up   [place], LCSW 01/27/2017, 1:01 PM

## 2017-01-28 DIAGNOSIS — I10 Essential (primary) hypertension: Secondary | ICD-10-CM

## 2017-01-28 DIAGNOSIS — N189 Chronic kidney disease, unspecified: Secondary | ICD-10-CM

## 2017-01-28 DIAGNOSIS — N390 Urinary tract infection, site not specified: Secondary | ICD-10-CM

## 2017-01-28 LAB — BASIC METABOLIC PANEL
Anion gap: 7 (ref 5–15)
BUN: 31 mg/dL — AB (ref 6–20)
CHLORIDE: 113 mmol/L — AB (ref 101–111)
CO2: 27 mmol/L (ref 22–32)
Calcium: 9.2 mg/dL (ref 8.9–10.3)
Creatinine, Ser: 1.53 mg/dL — ABNORMAL HIGH (ref 0.61–1.24)
GFR calc Af Amer: 43 mL/min — ABNORMAL LOW (ref >60)
GFR calc non Af Amer: 37 mL/min — ABNORMAL LOW (ref >60)
GLUCOSE: 106 mg/dL — AB (ref 65–99)
POTASSIUM: 3.3 mmol/L — AB (ref 3.5–5.1)
Sodium: 147 mmol/L — ABNORMAL HIGH (ref 135–145)

## 2017-01-28 LAB — GLUCOSE, CAPILLARY: Glucose-Capillary: 87 mg/dL (ref 65–99)

## 2017-01-28 MED ORDER — FOSFOMYCIN TROMETHAMINE 3 G PO PACK: 3.0000 g | PACK | Freq: Once | ORAL | 0 refills | Status: AC

## 2017-01-28 MED ORDER — DEXTROSE 5 % IV SOLN
INTRAVENOUS | Status: DC
  Administered 2017-01-28: 12:00:00 via INTRAVENOUS

## 2017-01-28 MED ORDER — POTASSIUM CHLORIDE CRYS ER 20 MEQ PO TBCR
40.0000 meq | EXTENDED_RELEASE_TABLET | Freq: Once | ORAL | Status: AC
Start: 2017-01-28 — End: 2017-01-28
  Administered 2017-01-28: 40 meq via ORAL
  Filled 2017-01-28: qty 2

## 2017-01-28 MED ORDER — BISACODYL 5 MG PO TBEC: 5.0000 mg | DELAYED_RELEASE_TABLET | Freq: Every day | ORAL | 0 refills | Status: DC | PRN

## 2017-01-28 MED ORDER — NITROFURANTOIN MONOHYD MACRO 100 MG PO CAPS
100.0000 mg | ORAL_CAPSULE | Freq: Two times a day (BID) | ORAL | Status: DC
  Filled 2017-01-28: qty 1

## 2017-01-28 MED ORDER — FOSFOMYCIN TROMETHAMINE 3 G PO PACK: 3.0000 g | PACK | Freq: Once | ORAL | Status: DC

## 2017-01-28 MED ORDER — FUROSEMIDE 20 MG PO TABS: 10.0000 mg | ORAL_TABLET | Freq: Every day | ORAL | 0 refills | Status: AC

## 2017-01-28 NOTE — Discharge Summary (Signed)
Physician Discharge Summary  Barry Wright LKG:[id number] DOB: 1920-10-12 DOA: 01/24/2017  PCP: Mathews Argyle, MD  Admit date: 01/24/2017 Discharge date: 01/28/2017  Time spent: 65 minutes  Recommendations for Outpatient Follow-up:  1. Patient be discharged home with palliative care to follow. 2. Follow-up with Mathews Argyle, MD in 1-2 weeks. On follow-up patient will need a basic metabolic profile done to follow-up on electrolytes and renal function. Patient's volume status will also need to be reassessed as patient's Lasix has been changed to 10 mg daily to start on 01/31/2017.   Discharge Diagnoses:  Principal Problem:   AKI (acute kidney injury) ([facility]) Active Problems:   Chronic kidney disease (CKD), stage III (moderate)   Polymyalgia rheumatica (HCC)   Coronary artery disease   Essential hypertension, benign   Hypothyroidism   Chronic diastolic heart failure ([facility])   Chronic back pain   Acute encephalopathy   Pressure ulcer   Acute lower UTI   Acute kidney injury superimposed on chronic kidney disease ([facility])   Acute urinary retention   Lower urinary tract infectious disease   Discharge Condition: Stable and improved  Diet recommendation: Heart healthy  Filed Weights   01/26/17 0500 01/27/17 0536 01/28/17 0500  Weight: 78 kg (171 lb 15.3 oz) 79.8 kg (175 lb 14.8 oz) 81.6 kg (179 lb 14.3 oz)    History of present illness:  Per Dr.Opyd Jacqualine Code is a 83 y.o. male with medical history significant for coronary artery disease, chronic diastolic CHF, chronic kidney disease stage III, hypothyroidism, polymyalgia rheumatica, and chronic sacral ulcer who presents to the emergency department from his nursing facility for evaluation of confusion for the past day and no urine output in close to 24 hours. Patient had reportedly been diagnosed with a urinary tract infection on 01/19/2017 and had been taking ciprofloxacin since that time. He had remained in his usual  state until he was noted to be increasingly confused beginning the evening prior to admission. He is also noted to have not urinated in approximately 24 hours. No fevers have been reported, there has been no recent fall or trauma, and the patient has not voiced any specific complaints aside from his chronic low back pain. Specifically, the patient denied any significant abdominal pain, flank pain, chest pain, or palpitations. He also denied dyspnea or cough. He denied headache, change in vision or hearing, or focal numbness or weakness.   ED Course: Upon arrival to the ED, patient was found to be afebrile, saturating well on room air, and with vital signs stable. Chemistry panels notable for BUN of 37 and creatinine of 2.17, up from prior of 1.3 in July of last year. CBC was notable for a mild macrocytosis without anemia, lactic acid is reassuring at 1.84, and urinalysis was consistent with infection and demonstrated hematuria. Patient was given a liter of normal saline and empiric Rocephin. Urine was sent for culture. Bladder scan was performed with 290 mL, drained with in and out catheterization. Patient had reportedly been urinating on his own after the fluids. He had remained hemodynamically stable and in no apparent respiratory distress and will be admitted to the medical/surgical unit for ongoing evaluation and management of acute kidney injury superimposed on CKD stage III, likely secondary to UTI with dehydration and retention, and with associated increase in confusion.  Hospital Course:  1. Acute kidney injury superimposed on CKD stage III  - SCr is 2.17 on admission, up from most recent value of 1.32 in July 2017  -  He had been on Cipro for 5 days for UTI and had not had UOP in the day leading up to admission - In and out cath drained 290 cc on presentation, and has been urinating on his own after IVF  - Patient was maintained on hydration with IV fluids during the hospitalization. -renal  US  negative; Normal appearance of the right kidney. No hydronephrosis. Left kidney is surgically absent. Layering debris in the bladder could indicate infection, hemorrhage, or tiny stones. Correlation with urinalysis is recommended. -renal function improved and creatinine was down to 1.53 by day of discharge. Patient be discharged in stable and improved condition and Lasix dose changed to 10 mg daily to be started on Saturday, 01/31/2017. Outpatient follow-up.  2. Acute encephalopathy  -related to toxic encephalopathy related to UTI -Patient was placed empirically on IV Rocephin as well as hydration. Urine cultures grew out Enterococcus faecalis and IV Rocephin was discontinued and patient given a dose of fosfomycin. Patient improved clinically and was close to baseline by day of discharge. Patient be discharged on 1 more dose of oral fosfomycin to complete a course of antibiotic treatment.  3. UTI with urinary retention  - Pt started on ciprofloxacin on 01/19/17 for UTI, culture data not available  - UA on admission consistent with persistent infection and patient was confused  on admission. -bladder scan noted some urinary retention which resolved during the hospitalization. - Patient was placed on IV Rocephin while urine cultures were pending.  -urine cultures grew enterococcus fecalis, sensitive to ampicillin but patient has allergies to penicillin. Due to renal function nitrofurantoin was avoided.  -Patient was given a dose of fosfomycin during the hospitalization and will be discharged home on 1 more dose of fosfomycin to be taken on 01/29/2017 to complete a course of antibiotic treatment. Outpatient follow-up.   4. Chronic diastolic CHF - Appearred intravascularly depleted on admission  - TTE (07/20/14) with EF 45-50%, diffuse HK, mild LVH, mild AS, mild AR, and mild TR . - Patient's diuretics were held throughout the hospitalization patient was hydrated. Patient be discharged home on  Lasix 10 mg daily to be started on Saturday, 01/31/2017. Outpatient follow-up.  5. CAD - Pt s/p remote CABG  - Continued on home regimen of Plavix   6. Polymyalgia rheumatica  - Remained stable  - Managed with weekly methotrexate, daily prednisone 5 mg, and folate  - Continued on folate and low-dose prednisone during the hospitalization.  7. Hypothyroidism  - continued on home regimen of Synthroid   8. Sacral ulcer  - Present on arrival. Patient received wound care during the hospitalization. Outpatient follow-up.   Procedures:  Renal ultrasound 01/24/2017    Consultations:  Palliative care Dr Rowe Pavy 01/27/2017  Discharge Exam: Vitals:   01/28/17 0536 01/28/17 1410  BP: (!) 163/74 (!) 165/87  Pulse: 63 73  Resp: 16 16  Temp: 98.2 F (36.8 C) 97.9 F (36.6 C)    General: NAD Cardiovascular: RRR with 3/6 SEM Respiratory: CTAB  Discharge Instructions   Discharge Instructions    Diet - low sodium heart healthy    Complete by:  As directed    Increase activity slowly    Complete by:  As directed      Current Discharge Medication List    START taking these medications   Details  bisacodyl (DULCOLAX) 5 MG EC tablet Take 1 tablet (5 mg total) by mouth daily as needed for moderate constipation. Qty: 30 tablet, Refills: 0  fosfomycin (MONUROL) 3 g PACK Take 3 g by mouth once. Take 1 tablet on 01/29/2017 for UTI. Qty: 3 g, Refills: 0      CONTINUE these medications which have CHANGED   Details  furosemide (LASIX) 20 MG tablet Take 0.5 tablets (10 mg total) by mouth daily. Resume on Saturday 01/31/2017. Qty: 30 tablet, Refills: 0      CONTINUE these medications which have NOT CHANGED   Details  acetaminophen (TYLENOL) 500 MG tablet Take 1,000 mg by mouth 2 (two) times daily.     Calcium-Magnesium-Vitamin D (CALCIUM 500 PO) Take 500 mg by mouth daily.    clopidogrel (PLAVIX) 75 MG tablet Take 75 mg by mouth daily.      folic acid (FOLVITE) 1 MG  tablet Take 1 mg by mouth daily.    gabapentin (NEURONTIN) 100 MG capsule Take 200 mg by mouth 3 (three) times daily. Per note: Takes 2 capsules three times/day.    methotrexate (RHEUMATREX) 2.5 MG tablet Take 10 mg by mouth every Wednesday.    Multiple Vitamins-Minerals (MULTIVITAMINS THER. W/MINERALS) TABS Take 1 tablet by mouth daily.      polyethylene glycol (MIRALAX / GLYCOLAX) packet Take 17 g by mouth daily as needed for mild constipation.     predniSONE (DELTASONE) 5 MG tablet Take 5 mg by mouth daily.    SYNTHROID 100 MCG tablet Take 100 mcg by mouth daily before breakfast.     traMADol (ULTRAM) 50 MG tablet Take one tablet by mouth twice daily for pains PRN Qty: 60 tablet, Refills: 0    feeding supplement, ENSURE ENLIVE, (ENSURE ENLIVE) LIQD Take 237 mLs by mouth 2 (two) times daily between meals. Qty: 237 mL, Refills: 12       Allergies  Allergen Reactions  . Penicillins Rash    Has patient had a PCN reaction causing immediate rash, facial/tongue/throat swelling, SOB or lightheadedness with hypotension: Unknown Has patient had a PCN reaction causing severe rash involving mucus membranes or skin necrosis: Unknown Has patient had a PCN reaction that required hospitalization: Unknown Has patient had a PCN reaction occurring within the last 10 years: Unknown If all of the above answers are "NO", then may proceed with Cephalosporin use.    Follow-up Information    [facility] Follow up.   Specialty:  Home Health Services Why:  For home physical therapy, occupational therapy and social work. Contact information: [place] [address] [phone]        [facility] Follow [facility].   Specialty:  Hospice and Palliative Medicine Why:  For home Palliative care services Contact information: [place] [phone] [facility] Follow up.   Why:  For Hospital  Bed. Contact information: [place] [address] [phone]        Mathews Argyle, MD. Schedule an appointment as soon as possible for a visit in 2 week(s).   Specialty:  Internal Medicine Why:  f/u in 1-2 weeks. Contact information: [address] [place] [address] [phone]            The results of significant diagnostics from this hospitalization (including imaging, microbiology, ancillary and laboratory) are listed below for reference.    Significant Diagnostic Studies: US Renal  Result Date: 01/24/2017 CLINICAL DATA:  Acute on chronic kidney disease. Urinary tract infection. Acute encephalopathy. Decreased urine output. Hypertension.  EXAM: RENAL / URINARY TRACT ULTRASOUND COMPLETE COMPARISON:  None. FINDINGS: Right Kidney: Length: 12 cm. Mild renal parenchymal thinning consistent with atrophy. Echogenicity within normal limits. No mass or hydronephrosis visualized. Left Kidney: Left kidney is surgically absent. Bladder: No bladder wall thickening. Small amount of layering debris in the bladder could indicate infection, hemorrhage, or possibly tiny stones. Correlation with urinalysis is recommended. IMPRESSION: Normal appearance of the right kidney. No hydronephrosis. Left kidney is surgically absent. Layering debris in the bladder could indicate infection, hemorrhage, or tiny stones. Correlation with urinalysis is recommended. Electronically Signed   By: Lucienne Capers M.D.   On: 01/24/2017 23:17    Microbiology: Recent Results (from the past 240 hour(s))  Urine culture     Status: Abnormal   Collection Time: 01/24/17  7:55 PM  Result Value Ref Range Status   Specimen Description URINE, CLEAN CATCH  Final   Special Requests NONE  Final   Culture >=100,000 COLONIES/mL ENTEROCOCCUS FAECALIS (A)  Final   Report Status 01/27/2017 FINAL  Final   Organism ID, Bacteria ENTEROCOCCUS FAECALIS (A)  Final      Susceptibility    Enterococcus faecalis - MIC*    AMPICILLIN <=2 SENSITIVE Sensitive     LEVOFLOXACIN >=8 RESISTANT Resistant     NITROFURANTOIN <=16 SENSITIVE Sensitive     VANCOMYCIN 1 SENSITIVE Sensitive     * >=100,000 COLONIES/mL ENTEROCOCCUS FAECALIS     Labs: Basic Metabolic Panel:  Recent Labs Lab 01/24/17 1932 01/25/17 0420 01/26/17 0357 01/27/17 0436 01/28/17 0909  NA 140 140 142 144 147*  K 3.8 3.5 3.6 3.3* 3.3*  CL 99* 103 106 110 113*  CO2 32 29 29 26 27   GLUCOSE 137* 103* 106* 100* 106*  BUN 37* 35* 33* 35* 31*  CREATININE 2.17* 2.09* 1.95* 1.77* 1.53*  CALCIUM 11.3* 10.6* 10.2 9.6 9.2   Liver Function Tests:  Recent Labs Lab 01/24/17 1932  AST 31  ALT 22  ALKPHOS 64  BILITOT 0.6  PROT 6.4*  ALBUMIN 3.2*   No results for input(s): LIPASE, AMYLASE in the last 168 hours. No results for input(s): AMMONIA in the last 168 hours. CBC:  Recent Labs Lab 01/24/17 1932 01/26/17 0357  WBC 8.7 7.1  HGB 13.0 11.7*  HCT 38.6* 34.4*  MCV 102.4* 101.8*  PLT 158 139*   Cardiac Enzymes: No results for input(s): CKTOTAL, CKMB, CKMBINDEX, TROPONINI in the last 168 hours. BNP: BNP (last 3 results) No results for input(s): BNP in the last 8760 hours.  ProBNP (last 3 results) No results for input(s): PROBNP in the last 8760 hours.  CBG:  Recent Labs Lab 01/27/17 0753 01/28/17 0808  GLUCAP 90 87       Signed:  THOMPSON,DANIEL MD.  [name] 01/28/2017, 3:33 PM

## 2017-01-28 NOTE — Progress Notes (Signed)
Physical Therapy Treatment Patient Details Name: Barry Wright MRN: [id number] DOB: 07/03/1920 Today's Date: 01/28/2017    History of Present Illness  83 y.o. male with medical history significant for coronary artery disease, chronic diastolic CHF, chronic kidney disease stage III, hypothyroidism, polymyalgia rheumatica, and chronic sacral ulcer who presents to the emergency department from his nursing facility for evaluation of confusion for the past day and no urine output in close to 24 hours. Dx of UTI, encephalopathy, AKI.    PT Comments    Performed supine BLE strengthening exercises and worked on sitting balance. He requires min A for static sitting balance 2* R lateral lean.  Mechanical lift recommended for OOB, nursing aware.   Follow Up Recommendations  SNF;Supervision/Assistance - 24 hour     Equipment Recommendations  None recommended by PT    Recommendations for Other Services       Precautions / Restrictions Precautions Precautions: Fall Precaution Comments: wife reports no falls in past 1 year Restrictions Weight Bearing Restrictions: No    Mobility  Bed Mobility Overal bed mobility: Needs Assistance       Supine to sit: +2 for safety/equipment;+2 for physical assistance;Max assist Sit to supine: +2 for physical assistance;Total assist   General bed mobility comments: pt 10%, assist for trunk and BLEs; sat on EOB x 4 minutes, tolerance limited by fatigue  Transfers                 General transfer comment: mechanical lift recommended  Ambulation/Gait                 Stairs            Wheelchair Mobility    Modified Rankin (Stroke Patients Only)       Balance Overall balance assessment: Needs assistance   Sitting balance-Leahy Scale: Poor Sitting balance - Comments: R lateral lean requiring min A initially, then supervision, sat on EOB x 4 minutes, performed forward reaching activity, flexed/kyphotic trunk and  neck Postural control: Right lateral lean                          Cognition Arousal/Alertness: Awake/alert Behavior During Therapy: WFL for tasks assessed/performed Overall Cognitive Status: No family/caregiver present to determine baseline cognitive functioning Area of Impairment: Memory       Following Commands: Follows one step commands consistently       General Comments: pt could only state birthdate with prompts, able to follow commands    Exercises General Exercises - Lower Extremity Ankle Circles/Pumps: AAROM;Both;10 reps Short Arc Quad: AROM;Both;10 reps;Supine Heel Slides: AAROM;Both;10 reps;Supine Hip ABduction/ADduction: AAROM;Both;10 reps;Supine    General Comments        Pertinent Vitals/Pain Pain Assessment: No/denies pain    Home Living                      Prior Function            PT Goals (current goals can now be found in the care plan section) Acute Rehab PT Goals Patient Stated Goal: to walk PT Goal Formulation: With patient/family Time For Goal Achievement: 02/09/17 Potential to Achieve Goals: Fair Progress towards PT goals: Progressing toward goals    Frequency    Min 3X/week      PT Plan Current plan remains appropriate    Co-evaluation             End of Session   Activity  Tolerance: Patient limited by fatigue Patient left: in bed;with call bell/phone within reach;with bed alarm set Nurse Communication: Mobility status;Need for lift equipment PT Visit Diagnosis: Muscle weakness (generalized) (M62.81);Other abnormalities of gait and mobility (R26.89)     Time: 3491-7915 PT Time Calculation (min) (ACUTE ONLY): 24 min  Charges:  $Therapeutic Exercise: 8-22 mins $Therapeutic Activity: 8-22 mins                    G Codes:       Barry Wright 01/28/2017, 2:27 PM [phone]

## 2017-01-28 NOTE — Progress Notes (Signed)
Hospital bed to be delivered to pt home between 4-8pm this afternoon. Caregiver to call the nursing unit when bed is delivered so RN can call [facility] for pt transport. RN made aware and Medical Necessity form filled out for transport. No other CM needs communicated. Marney Doctor RN,BSN,NCM [phone]

## 2017-01-28 NOTE — Care Management Note (Signed)
Case Management Note  Patient Details  Name: Barry Wright MRN: [id number] Date of Birth: [birth year]  Subjective/Objective:    83 yo admitted with AKI and enterococcus UTI                Action/Plan: Pt from home with spouse and private duty caregivers. CM consulted for home with Palliative Care, Home Health Services and DME needs. Pt wife requesting Hospital Bed for home. [facility] alerted of need for Hospital bed and MD order written. Wife offered choice for Palliative Care Services and [facility] chosen. [facility] called for referral and this CM left voicemail with Verdis Frederickson from [facility]. Choice also offered for [facility] and [facility] chosen. [facility] rep called for referral and orders written. Pt to DC home via [facility] and wife aware there could be a charge for this. No other CM needs communicated.  Expected Discharge Date:  01/28/17               Expected Discharge Plan:     In-House Referral:     Discharge planning Services     Post Acute Care Choice:    Choice offered to:     DME Arranged:    DME Agency:     HH Arranged:    HH Agency:     Status of Service:     If discussed at [employer], dates discussed:    Additional CommentsLynnell Catalan, RN 01/28/2017, 3:13 PM  [phone]

## 2017-01-28 NOTE — Care Management Important Message (Signed)
Important Message  Patient Details  Name: ARAM DOMZALSKI MRN: [id number] Date of Birth: July 25, 1920   Medicare Important Message Given:  Yes    Kerin Salen 01/28/2017, 11:49 AMImportant Message  Patient Details  Name: BRAEDAN MEUTH MRN: [id number] Date of Birth: [birth year]   Medicare Important Message Given:  Yes    Kerin Salen 01/28/2017, 11:49 AM

## 2017-02-03 DIAGNOSIS — R531 Weakness: Secondary | ICD-10-CM | POA: Diagnosis not present

## 2017-02-10 DIAGNOSIS — I5032 Chronic diastolic (congestive) heart failure: Secondary | ICD-10-CM | POA: Diagnosis not present

## 2017-02-10 DIAGNOSIS — I130 Hypertensive heart and chronic kidney disease with heart failure and stage 1 through stage 4 chronic kidney disease, or unspecified chronic kidney disease: Secondary | ICD-10-CM | POA: Diagnosis not present

## 2017-02-10 DIAGNOSIS — N183 Chronic kidney disease, stage 3 (moderate): Secondary | ICD-10-CM | POA: Diagnosis not present

## 2017-02-10 DIAGNOSIS — M48061 Spinal stenosis, lumbar region without neurogenic claudication: Secondary | ICD-10-CM | POA: Diagnosis not present

## 2017-02-10 DIAGNOSIS — L89152 Pressure ulcer of sacral region, stage 2: Secondary | ICD-10-CM | POA: Diagnosis not present

## 2017-02-10 DIAGNOSIS — M353 Polymyalgia rheumatica: Secondary | ICD-10-CM | POA: Diagnosis not present

## 2017-02-10 DIAGNOSIS — I2510 Atherosclerotic heart disease of native coronary artery without angina pectoris: Secondary | ICD-10-CM | POA: Diagnosis not present

## 2017-02-10 DIAGNOSIS — Z87440 Personal history of urinary (tract) infections: Secondary | ICD-10-CM | POA: Diagnosis not present

## 2017-02-10 DIAGNOSIS — Z7952 Long term (current) use of systemic steroids: Secondary | ICD-10-CM | POA: Diagnosis not present

## 2017-02-12 DIAGNOSIS — I2510 Atherosclerotic heart disease of native coronary artery without angina pectoris: Secondary | ICD-10-CM | POA: Diagnosis not present

## 2017-02-12 DIAGNOSIS — N183 Chronic kidney disease, stage 3 (moderate): Secondary | ICD-10-CM | POA: Diagnosis not present

## 2017-02-12 DIAGNOSIS — I5032 Chronic diastolic (congestive) heart failure: Secondary | ICD-10-CM | POA: Diagnosis not present

## 2017-02-12 DIAGNOSIS — M48061 Spinal stenosis, lumbar region without neurogenic claudication: Secondary | ICD-10-CM | POA: Diagnosis not present

## 2017-02-12 DIAGNOSIS — I130 Hypertensive heart and chronic kidney disease with heart failure and stage 1 through stage 4 chronic kidney disease, or unspecified chronic kidney disease: Secondary | ICD-10-CM | POA: Diagnosis not present

## 2017-02-12 DIAGNOSIS — M353 Polymyalgia rheumatica: Secondary | ICD-10-CM | POA: Diagnosis not present

## 2017-02-13 DIAGNOSIS — R531 Weakness: Secondary | ICD-10-CM | POA: Diagnosis not present

## 2017-02-16 DIAGNOSIS — M48061 Spinal stenosis, lumbar region without neurogenic claudication: Secondary | ICD-10-CM | POA: Diagnosis not present

## 2017-02-16 DIAGNOSIS — I130 Hypertensive heart and chronic kidney disease with heart failure and stage 1 through stage 4 chronic kidney disease, or unspecified chronic kidney disease: Secondary | ICD-10-CM | POA: Diagnosis not present

## 2017-02-16 DIAGNOSIS — M353 Polymyalgia rheumatica: Secondary | ICD-10-CM | POA: Diagnosis not present

## 2017-02-16 DIAGNOSIS — I2510 Atherosclerotic heart disease of native coronary artery without angina pectoris: Secondary | ICD-10-CM | POA: Diagnosis not present

## 2017-02-16 DIAGNOSIS — N183 Chronic kidney disease, stage 3 (moderate): Secondary | ICD-10-CM | POA: Diagnosis not present

## 2017-02-16 DIAGNOSIS — I5032 Chronic diastolic (congestive) heart failure: Secondary | ICD-10-CM | POA: Diagnosis not present

## 2017-02-18 DIAGNOSIS — M48061 Spinal stenosis, lumbar region without neurogenic claudication: Secondary | ICD-10-CM | POA: Diagnosis not present

## 2017-02-18 DIAGNOSIS — N183 Chronic kidney disease, stage 3 (moderate): Secondary | ICD-10-CM | POA: Diagnosis not present

## 2017-02-18 DIAGNOSIS — I5032 Chronic diastolic (congestive) heart failure: Secondary | ICD-10-CM | POA: Diagnosis not present

## 2017-02-18 DIAGNOSIS — M353 Polymyalgia rheumatica: Secondary | ICD-10-CM | POA: Diagnosis not present

## 2017-02-18 DIAGNOSIS — I2510 Atherosclerotic heart disease of native coronary artery without angina pectoris: Secondary | ICD-10-CM | POA: Diagnosis not present

## 2017-02-18 DIAGNOSIS — I130 Hypertensive heart and chronic kidney disease with heart failure and stage 1 through stage 4 chronic kidney disease, or unspecified chronic kidney disease: Secondary | ICD-10-CM | POA: Diagnosis not present

## 2017-02-19 DIAGNOSIS — I2510 Atherosclerotic heart disease of native coronary artery without angina pectoris: Secondary | ICD-10-CM | POA: Diagnosis not present

## 2017-02-19 DIAGNOSIS — N183 Chronic kidney disease, stage 3 (moderate): Secondary | ICD-10-CM | POA: Diagnosis not present

## 2017-02-19 DIAGNOSIS — I130 Hypertensive heart and chronic kidney disease with heart failure and stage 1 through stage 4 chronic kidney disease, or unspecified chronic kidney disease: Secondary | ICD-10-CM | POA: Diagnosis not present

## 2017-02-19 DIAGNOSIS — I5032 Chronic diastolic (congestive) heart failure: Secondary | ICD-10-CM | POA: Diagnosis not present

## 2017-02-19 DIAGNOSIS — M353 Polymyalgia rheumatica: Secondary | ICD-10-CM | POA: Diagnosis not present

## 2017-02-19 DIAGNOSIS — M48061 Spinal stenosis, lumbar region without neurogenic claudication: Secondary | ICD-10-CM | POA: Diagnosis not present

## 2017-02-20 DIAGNOSIS — N390 Urinary tract infection, site not specified: Secondary | ICD-10-CM | POA: Diagnosis not present

## 2017-02-20 DIAGNOSIS — I359 Nonrheumatic aortic valve disorder, unspecified: Secondary | ICD-10-CM | POA: Diagnosis not present

## 2017-02-20 DIAGNOSIS — N183 Chronic kidney disease, stage 3 (moderate): Secondary | ICD-10-CM | POA: Diagnosis not present

## 2017-02-20 DIAGNOSIS — L8992 Pressure ulcer of unspecified site, stage 2: Secondary | ICD-10-CM | POA: Diagnosis not present

## 2017-02-20 DIAGNOSIS — M353 Polymyalgia rheumatica: Secondary | ICD-10-CM | POA: Diagnosis not present

## 2017-02-20 DIAGNOSIS — I10 Essential (primary) hypertension: Secondary | ICD-10-CM | POA: Diagnosis not present

## 2017-02-20 DIAGNOSIS — E039 Hypothyroidism, unspecified: Secondary | ICD-10-CM | POA: Diagnosis not present

## 2017-02-20 DIAGNOSIS — I5030 Unspecified diastolic (congestive) heart failure: Secondary | ICD-10-CM | POA: Diagnosis not present

## 2017-02-20 DIAGNOSIS — I679 Cerebrovascular disease, unspecified: Secondary | ICD-10-CM | POA: Diagnosis not present

## 2017-02-20 DIAGNOSIS — G4733 Obstructive sleep apnea (adult) (pediatric): Secondary | ICD-10-CM | POA: Diagnosis not present

## 2017-02-20 DIAGNOSIS — I25119 Atherosclerotic heart disease of native coronary artery with unspecified angina pectoris: Secondary | ICD-10-CM | POA: Diagnosis not present

## 2017-02-20 DIAGNOSIS — I509 Heart failure, unspecified: Secondary | ICD-10-CM | POA: Diagnosis not present

## 2017-02-23 DIAGNOSIS — M353 Polymyalgia rheumatica: Secondary | ICD-10-CM | POA: Diagnosis not present

## 2017-02-23 DIAGNOSIS — I359 Nonrheumatic aortic valve disorder, unspecified: Secondary | ICD-10-CM | POA: Diagnosis not present

## 2017-02-23 DIAGNOSIS — I5030 Unspecified diastolic (congestive) heart failure: Secondary | ICD-10-CM | POA: Diagnosis not present

## 2017-02-23 DIAGNOSIS — I25119 Atherosclerotic heart disease of native coronary artery with unspecified angina pectoris: Secondary | ICD-10-CM | POA: Diagnosis not present

## 2017-02-23 DIAGNOSIS — I679 Cerebrovascular disease, unspecified: Secondary | ICD-10-CM | POA: Diagnosis not present

## 2017-02-23 DIAGNOSIS — I509 Heart failure, unspecified: Secondary | ICD-10-CM | POA: Diagnosis not present

## 2017-02-24 DIAGNOSIS — I5030 Unspecified diastolic (congestive) heart failure: Secondary | ICD-10-CM | POA: Diagnosis not present

## 2017-02-24 DIAGNOSIS — I509 Heart failure, unspecified: Secondary | ICD-10-CM | POA: Diagnosis not present

## 2017-02-24 DIAGNOSIS — I25119 Atherosclerotic heart disease of native coronary artery with unspecified angina pectoris: Secondary | ICD-10-CM | POA: Diagnosis not present

## 2017-02-24 DIAGNOSIS — I359 Nonrheumatic aortic valve disorder, unspecified: Secondary | ICD-10-CM | POA: Diagnosis not present

## 2017-02-24 DIAGNOSIS — M353 Polymyalgia rheumatica: Secondary | ICD-10-CM | POA: Diagnosis not present

## 2017-02-24 DIAGNOSIS — I679 Cerebrovascular disease, unspecified: Secondary | ICD-10-CM | POA: Diagnosis not present

## 2017-02-26 DIAGNOSIS — I25119 Atherosclerotic heart disease of native coronary artery with unspecified angina pectoris: Secondary | ICD-10-CM | POA: Diagnosis not present

## 2017-02-26 DIAGNOSIS — M353 Polymyalgia rheumatica: Secondary | ICD-10-CM | POA: Diagnosis not present

## 2017-02-26 DIAGNOSIS — I5030 Unspecified diastolic (congestive) heart failure: Secondary | ICD-10-CM | POA: Diagnosis not present

## 2017-02-26 DIAGNOSIS — I679 Cerebrovascular disease, unspecified: Secondary | ICD-10-CM | POA: Diagnosis not present

## 2017-02-26 DIAGNOSIS — I509 Heart failure, unspecified: Secondary | ICD-10-CM | POA: Diagnosis not present

## 2017-02-26 DIAGNOSIS — I359 Nonrheumatic aortic valve disorder, unspecified: Secondary | ICD-10-CM | POA: Diagnosis not present

## 2017-03-03 DIAGNOSIS — I359 Nonrheumatic aortic valve disorder, unspecified: Secondary | ICD-10-CM | POA: Diagnosis not present

## 2017-03-03 DIAGNOSIS — I25119 Atherosclerotic heart disease of native coronary artery with unspecified angina pectoris: Secondary | ICD-10-CM | POA: Diagnosis not present

## 2017-03-03 DIAGNOSIS — I679 Cerebrovascular disease, unspecified: Secondary | ICD-10-CM | POA: Diagnosis not present

## 2017-03-03 DIAGNOSIS — M353 Polymyalgia rheumatica: Secondary | ICD-10-CM | POA: Diagnosis not present

## 2017-03-03 DIAGNOSIS — I509 Heart failure, unspecified: Secondary | ICD-10-CM | POA: Diagnosis not present

## 2017-03-03 DIAGNOSIS — I5030 Unspecified diastolic (congestive) heart failure: Secondary | ICD-10-CM | POA: Diagnosis not present

## 2017-03-10 DIAGNOSIS — I10 Essential (primary) hypertension: Secondary | ICD-10-CM | POA: Diagnosis not present

## 2017-03-10 DIAGNOSIS — M353 Polymyalgia rheumatica: Secondary | ICD-10-CM | POA: Diagnosis not present

## 2017-03-10 DIAGNOSIS — I5030 Unspecified diastolic (congestive) heart failure: Secondary | ICD-10-CM | POA: Diagnosis not present

## 2017-03-10 DIAGNOSIS — N390 Urinary tract infection, site not specified: Secondary | ICD-10-CM | POA: Diagnosis not present

## 2017-03-10 DIAGNOSIS — I359 Nonrheumatic aortic valve disorder, unspecified: Secondary | ICD-10-CM | POA: Diagnosis not present

## 2017-03-10 DIAGNOSIS — I679 Cerebrovascular disease, unspecified: Secondary | ICD-10-CM | POA: Diagnosis not present

## 2017-03-10 DIAGNOSIS — I25119 Atherosclerotic heart disease of native coronary artery with unspecified angina pectoris: Secondary | ICD-10-CM | POA: Diagnosis not present

## 2017-03-10 DIAGNOSIS — L8992 Pressure ulcer of unspecified site, stage 2: Secondary | ICD-10-CM | POA: Diagnosis not present

## 2017-03-10 DIAGNOSIS — N183 Chronic kidney disease, stage 3 (moderate): Secondary | ICD-10-CM | POA: Diagnosis not present

## 2017-03-10 DIAGNOSIS — I509 Heart failure, unspecified: Secondary | ICD-10-CM | POA: Diagnosis not present

## 2017-03-10 DIAGNOSIS — E039 Hypothyroidism, unspecified: Secondary | ICD-10-CM | POA: Diagnosis not present

## 2017-03-10 DIAGNOSIS — G4733 Obstructive sleep apnea (adult) (pediatric): Secondary | ICD-10-CM | POA: Diagnosis not present

## 2017-03-11 DIAGNOSIS — I359 Nonrheumatic aortic valve disorder, unspecified: Secondary | ICD-10-CM | POA: Diagnosis not present

## 2017-03-11 DIAGNOSIS — I679 Cerebrovascular disease, unspecified: Secondary | ICD-10-CM | POA: Diagnosis not present

## 2017-03-11 DIAGNOSIS — I5030 Unspecified diastolic (congestive) heart failure: Secondary | ICD-10-CM | POA: Diagnosis not present

## 2017-03-11 DIAGNOSIS — I509 Heart failure, unspecified: Secondary | ICD-10-CM | POA: Diagnosis not present

## 2017-03-11 DIAGNOSIS — M353 Polymyalgia rheumatica: Secondary | ICD-10-CM | POA: Diagnosis not present

## 2017-03-11 DIAGNOSIS — I25119 Atherosclerotic heart disease of native coronary artery with unspecified angina pectoris: Secondary | ICD-10-CM | POA: Diagnosis not present

## 2017-03-12 DIAGNOSIS — I679 Cerebrovascular disease, unspecified: Secondary | ICD-10-CM | POA: Diagnosis not present

## 2017-03-12 DIAGNOSIS — I359 Nonrheumatic aortic valve disorder, unspecified: Secondary | ICD-10-CM | POA: Diagnosis not present

## 2017-03-12 DIAGNOSIS — M353 Polymyalgia rheumatica: Secondary | ICD-10-CM | POA: Diagnosis not present

## 2017-03-12 DIAGNOSIS — I5030 Unspecified diastolic (congestive) heart failure: Secondary | ICD-10-CM | POA: Diagnosis not present

## 2017-03-12 DIAGNOSIS — I509 Heart failure, unspecified: Secondary | ICD-10-CM | POA: Diagnosis not present

## 2017-03-12 DIAGNOSIS — I25119 Atherosclerotic heart disease of native coronary artery with unspecified angina pectoris: Secondary | ICD-10-CM | POA: Diagnosis not present

## 2017-03-16 DIAGNOSIS — M353 Polymyalgia rheumatica: Secondary | ICD-10-CM | POA: Diagnosis not present

## 2017-03-16 DIAGNOSIS — I25119 Atherosclerotic heart disease of native coronary artery with unspecified angina pectoris: Secondary | ICD-10-CM | POA: Diagnosis not present

## 2017-03-16 DIAGNOSIS — I679 Cerebrovascular disease, unspecified: Secondary | ICD-10-CM | POA: Diagnosis not present

## 2017-03-16 DIAGNOSIS — I509 Heart failure, unspecified: Secondary | ICD-10-CM | POA: Diagnosis not present

## 2017-03-16 DIAGNOSIS — I5030 Unspecified diastolic (congestive) heart failure: Secondary | ICD-10-CM | POA: Diagnosis not present

## 2017-03-16 DIAGNOSIS — I359 Nonrheumatic aortic valve disorder, unspecified: Secondary | ICD-10-CM | POA: Diagnosis not present

## 2017-03-23 DIAGNOSIS — I25119 Atherosclerotic heart disease of native coronary artery with unspecified angina pectoris: Secondary | ICD-10-CM | POA: Diagnosis not present

## 2017-03-23 DIAGNOSIS — I5030 Unspecified diastolic (congestive) heart failure: Secondary | ICD-10-CM | POA: Diagnosis not present

## 2017-03-23 DIAGNOSIS — I679 Cerebrovascular disease, unspecified: Secondary | ICD-10-CM | POA: Diagnosis not present

## 2017-03-23 DIAGNOSIS — I359 Nonrheumatic aortic valve disorder, unspecified: Secondary | ICD-10-CM | POA: Diagnosis not present

## 2017-03-23 DIAGNOSIS — M353 Polymyalgia rheumatica: Secondary | ICD-10-CM | POA: Diagnosis not present

## 2017-03-23 DIAGNOSIS — I509 Heart failure, unspecified: Secondary | ICD-10-CM | POA: Diagnosis not present

## 2017-03-24 DIAGNOSIS — M353 Polymyalgia rheumatica: Secondary | ICD-10-CM | POA: Diagnosis not present

## 2017-03-24 DIAGNOSIS — I359 Nonrheumatic aortic valve disorder, unspecified: Secondary | ICD-10-CM | POA: Diagnosis not present

## 2017-03-24 DIAGNOSIS — I25119 Atherosclerotic heart disease of native coronary artery with unspecified angina pectoris: Secondary | ICD-10-CM | POA: Diagnosis not present

## 2017-03-24 DIAGNOSIS — I5030 Unspecified diastolic (congestive) heart failure: Secondary | ICD-10-CM | POA: Diagnosis not present

## 2017-03-24 DIAGNOSIS — I509 Heart failure, unspecified: Secondary | ICD-10-CM | POA: Diagnosis not present

## 2017-03-24 DIAGNOSIS — I679 Cerebrovascular disease, unspecified: Secondary | ICD-10-CM | POA: Diagnosis not present

## 2017-03-25 DIAGNOSIS — I679 Cerebrovascular disease, unspecified: Secondary | ICD-10-CM | POA: Diagnosis not present

## 2017-03-25 DIAGNOSIS — I509 Heart failure, unspecified: Secondary | ICD-10-CM | POA: Diagnosis not present

## 2017-03-25 DIAGNOSIS — I25119 Atherosclerotic heart disease of native coronary artery with unspecified angina pectoris: Secondary | ICD-10-CM | POA: Diagnosis not present

## 2017-03-25 DIAGNOSIS — I359 Nonrheumatic aortic valve disorder, unspecified: Secondary | ICD-10-CM | POA: Diagnosis not present

## 2017-03-25 DIAGNOSIS — M353 Polymyalgia rheumatica: Secondary | ICD-10-CM | POA: Diagnosis not present

## 2017-03-25 DIAGNOSIS — I5030 Unspecified diastolic (congestive) heart failure: Secondary | ICD-10-CM | POA: Diagnosis not present

## 2017-03-26 DIAGNOSIS — I359 Nonrheumatic aortic valve disorder, unspecified: Secondary | ICD-10-CM | POA: Diagnosis not present

## 2017-03-26 DIAGNOSIS — M353 Polymyalgia rheumatica: Secondary | ICD-10-CM | POA: Diagnosis not present

## 2017-03-26 DIAGNOSIS — I509 Heart failure, unspecified: Secondary | ICD-10-CM | POA: Diagnosis not present

## 2017-03-26 DIAGNOSIS — I5030 Unspecified diastolic (congestive) heart failure: Secondary | ICD-10-CM | POA: Diagnosis not present

## 2017-03-26 DIAGNOSIS — I25119 Atherosclerotic heart disease of native coronary artery with unspecified angina pectoris: Secondary | ICD-10-CM | POA: Diagnosis not present

## 2017-03-26 DIAGNOSIS — I679 Cerebrovascular disease, unspecified: Secondary | ICD-10-CM | POA: Diagnosis not present

## 2017-04-01 DIAGNOSIS — I5030 Unspecified diastolic (congestive) heart failure: Secondary | ICD-10-CM | POA: Diagnosis not present

## 2017-04-01 DIAGNOSIS — I679 Cerebrovascular disease, unspecified: Secondary | ICD-10-CM | POA: Diagnosis not present

## 2017-04-01 DIAGNOSIS — I25119 Atherosclerotic heart disease of native coronary artery with unspecified angina pectoris: Secondary | ICD-10-CM | POA: Diagnosis not present

## 2017-04-01 DIAGNOSIS — I359 Nonrheumatic aortic valve disorder, unspecified: Secondary | ICD-10-CM | POA: Diagnosis not present

## 2017-04-01 DIAGNOSIS — M353 Polymyalgia rheumatica: Secondary | ICD-10-CM | POA: Diagnosis not present

## 2017-04-01 DIAGNOSIS — I509 Heart failure, unspecified: Secondary | ICD-10-CM | POA: Diagnosis not present

## 2017-04-04 ENCOUNTER — Emergency Department (HOSPITAL_COMMUNITY)
Admission: EM | Admit: 2017-04-04 | Discharge: 2017-04-04 | Disposition: A | Discharge status: Home / Self Care | Attending: Emergency Medicine | Admitting: Emergency Medicine

## 2017-04-04 ENCOUNTER — Encounter (HOSPITAL_COMMUNITY): Payer: Self-pay

## 2017-04-04 ENCOUNTER — Emergency Department (HOSPITAL_COMMUNITY)

## 2017-04-04 DIAGNOSIS — I359 Nonrheumatic aortic valve disorder, unspecified: Secondary | ICD-10-CM | POA: Diagnosis not present

## 2017-04-04 DIAGNOSIS — Z79899 Other long term (current) drug therapy: Secondary | ICD-10-CM | POA: Insufficient documentation

## 2017-04-04 DIAGNOSIS — I5032 Chronic diastolic (congestive) heart failure: Secondary | ICD-10-CM | POA: Insufficient documentation

## 2017-04-04 DIAGNOSIS — I130 Hypertensive heart and chronic kidney disease with heart failure and stage 1 through stage 4 chronic kidney disease, or unspecified chronic kidney disease: Secondary | ICD-10-CM | POA: Diagnosis not present

## 2017-04-04 DIAGNOSIS — M353 Polymyalgia rheumatica: Secondary | ICD-10-CM | POA: Diagnosis not present

## 2017-04-04 DIAGNOSIS — R1030 Lower abdominal pain, unspecified: Secondary | ICD-10-CM

## 2017-04-04 DIAGNOSIS — N183 Chronic kidney disease, stage 3 (moderate): Secondary | ICD-10-CM | POA: Diagnosis not present

## 2017-04-04 DIAGNOSIS — Z8673 Personal history of transient ischemic attack (TIA), and cerebral infarction without residual deficits: Secondary | ICD-10-CM | POA: Diagnosis not present

## 2017-04-04 DIAGNOSIS — R030 Elevated blood-pressure reading, without diagnosis of hypertension: Secondary | ICD-10-CM | POA: Diagnosis not present

## 2017-04-04 DIAGNOSIS — N3289 Other specified disorders of bladder: Secondary | ICD-10-CM | POA: Diagnosis not present

## 2017-04-04 DIAGNOSIS — E039 Hypothyroidism, unspecified: Secondary | ICD-10-CM | POA: Insufficient documentation

## 2017-04-04 DIAGNOSIS — I252 Old myocardial infarction: Secondary | ICD-10-CM | POA: Insufficient documentation

## 2017-04-04 DIAGNOSIS — Z87891 Personal history of nicotine dependence: Secondary | ICD-10-CM | POA: Diagnosis not present

## 2017-04-04 DIAGNOSIS — N3000 Acute cystitis without hematuria: Secondary | ICD-10-CM | POA: Diagnosis not present

## 2017-04-04 DIAGNOSIS — I509 Heart failure, unspecified: Secondary | ICD-10-CM | POA: Diagnosis not present

## 2017-04-04 DIAGNOSIS — R10819 Abdominal tenderness, unspecified site: Secondary | ICD-10-CM | POA: Diagnosis not present

## 2017-04-04 DIAGNOSIS — I2510 Atherosclerotic heart disease of native coronary artery without angina pectoris: Secondary | ICD-10-CM | POA: Diagnosis not present

## 2017-04-04 DIAGNOSIS — I25119 Atherosclerotic heart disease of native coronary artery with unspecified angina pectoris: Secondary | ICD-10-CM | POA: Diagnosis not present

## 2017-04-04 DIAGNOSIS — R100 Acute abdomen: Secondary | ICD-10-CM | POA: Diagnosis not present

## 2017-04-04 DIAGNOSIS — I5030 Unspecified diastolic (congestive) heart failure: Secondary | ICD-10-CM | POA: Diagnosis not present

## 2017-04-04 DIAGNOSIS — N390 Urinary tract infection, site not specified: Secondary | ICD-10-CM | POA: Diagnosis not present

## 2017-04-04 DIAGNOSIS — Z951 Presence of aortocoronary bypass graft: Secondary | ICD-10-CM | POA: Diagnosis not present

## 2017-04-04 DIAGNOSIS — I679 Cerebrovascular disease, unspecified: Secondary | ICD-10-CM | POA: Diagnosis not present

## 2017-04-04 LAB — URINALYSIS, ROUTINE W REFLEX MICROSCOPIC
Bacteria, UA: NONE SEEN
Bilirubin Urine: NEGATIVE
Glucose, UA: NEGATIVE mg/dL
Ketones, ur: NEGATIVE mg/dL
Nitrite: NEGATIVE
Protein, ur: 30 mg/dL — AB
Specific Gravity, Urine: 1.013 (ref 1.005–1.030)
Squamous Epithelial / LPF: NONE SEEN
pH: 6 (ref 5.0–8.0)

## 2017-04-04 LAB — CBC
HCT: 38.7 % — ABNORMAL LOW (ref 39.0–52.0)
Hemoglobin: 12.4 g/dL — ABNORMAL LOW (ref 13.0–17.0)
MCH: 34.6 pg — ABNORMAL HIGH (ref 26.0–34.0)
MCHC: 32 g/dL (ref 30.0–36.0)
MCV: 108.1 fL — ABNORMAL HIGH (ref 78.0–100.0)
Platelets: 165 10*3/uL (ref 150–400)
RBC: 3.58 MIL/uL — ABNORMAL LOW (ref 4.22–5.81)
RDW: 15.2 % (ref 11.5–15.5)
WBC: 8.3 10*3/uL (ref 4.0–10.5)

## 2017-04-04 LAB — COMPREHENSIVE METABOLIC PANEL
ALT: 11 U/L — ABNORMAL LOW (ref 17–63)
AST: 20 U/L (ref 15–41)
Albumin: 2.9 g/dL — ABNORMAL LOW (ref 3.5–5.0)
Alkaline Phosphatase: 67 U/L (ref 38–126)
Anion gap: 6 (ref 5–15)
BUN: 33 mg/dL — ABNORMAL HIGH (ref 6–20)
CO2: 26 mmol/L (ref 22–32)
Calcium: 8.7 mg/dL — ABNORMAL LOW (ref 8.9–10.3)
Chloride: 107 mmol/L (ref 101–111)
Creatinine, Ser: 1.53 mg/dL — ABNORMAL HIGH (ref 0.61–1.24)
GFR calc Af Amer: 43 mL/min — ABNORMAL LOW (ref >60)
GFR calc non Af Amer: 37 mL/min — ABNORMAL LOW (ref >60)
Glucose, Bld: 99 mg/dL (ref 65–99)
Potassium: 3.9 mmol/L (ref 3.5–5.1)
Sodium: 139 mmol/L (ref 135–145)
Total Bilirubin: 0.5 mg/dL (ref 0.3–1.2)
Total Protein: 5.3 g/dL — ABNORMAL LOW (ref 6.5–8.1)

## 2017-04-04 LAB — LIPASE, BLOOD: Lipase: 16 U/L (ref 11–51)

## 2017-04-04 MED ORDER — CIPROFLOXACIN HCL 500 MG PO TABS: 500.0000 mg | ORAL_TABLET | Freq: Two times a day (BID) | ORAL | 0 refills | Status: DC

## 2017-04-04 MED ORDER — MORPHINE SULFATE (PF) 4 MG/ML IV SOLN
4.0000 mg | Freq: Once | INTRAVENOUS | Status: DC
Start: 2017-04-04 — End: 2017-04-04
  Filled 2017-04-04: qty 1

## 2017-04-04 MED ORDER — SULFAMETHOXAZOLE-TRIMETHOPRIM 800-160 MG PO TABS
1.0000 | ORAL_TABLET | Freq: Once | ORAL | Status: AC
  Administered 2017-04-04: 1 via ORAL
  Filled 2017-04-04: qty 1

## 2017-04-04 MED ORDER — IOPAMIDOL (ISOVUE-300) INJECTION 61%
INTRAVENOUS | Status: AC
  Administered 2017-04-04: 75 mL
  Filled 2017-04-04: qty 75

## 2017-04-04 NOTE — ED Notes (Signed)
Attempted to obtain urine specimen; Pt unable to provide one at this time 

## 2017-04-04 NOTE — ED Triage Notes (Addendum)
Pt arrives EMS from home with c/o right lower quad pain x 3 weeks. Given tramadol for same. Denies N/V/D. States he is bed bound due to knee problems.

## 2017-04-04 NOTE — ED Provider Notes (Signed)
[facility] DEPT Provider Note    By signing my name below, I, Bea Graff, attest that this documentation has been prepared under the direction and in the presence of Virgel Manifold, MD. Electronically Signed: Bea Graff, ED Scribe. 04/04/17. 2:16 PM.    History   Chief Complaint Chief Complaint  Patient presents with  . Abdominal Pain   The history is provided by the patient and medical records. No language interpreter was used.    Barry Wright is a 83 y.o. male with PMHx of CAD, CHF, chronic back pain, CKD, hypothyroidism, MI and CVA brought in by EMS who presents to the Emergency Department complaining of worsening intermittent sharp suprapubic pain that began approximately 3 weeks ago. He has been taking Tramadol every 4 hours for pain with some temporary relief. There are no modifying factors noted. He denies urinary complaints, bowel changes, fever, chills, rectal pain, nausea, vomiting. He denies any abdominal surgeries but reports a nephrectomy (unsure of the laterality).    Past Medical History:  Diagnosis Date  . CAD (coronary artery disease)   . CHF (congestive heart failure) ([place])   . Chronic back pain   . Chronic kidney disease (CKD), stage III (moderate)   . History of nephrectomy   . Hx of CABG   . Hypercholesteremia   . Hypothyroidism   . Internal carotid artery stenosis   . Left bundle branch block   . Myocardial infarction ([place])   . Polymyalgia rheumatica ([place])   . Stroke [facility])     Patient Active Problem List   Diagnosis Date Noted  . Lower urinary tract infectious disease   . Acute kidney injury superimposed on chronic kidney disease ([place]) 01/24/2017  . Acute urinary retention 01/24/2017  . History of nephrectomy   . Acute lower UTI 02/21/2016  . AKI (acute kidney injury) ([place]) 02/21/2016  . Pressure ulcer 09/29/2015  . Acute encephalopathy   . Chronic back pain 09/26/2015  . Generalized weakness 04/13/2015  . Chronic diastolic  heart failure ([place]) 06/19/2014  . Essential hypertension, benign 03/08/2014  . Hypothyroidism 03/08/2014  . Protein-calorie malnutrition, moderate ([place]) 03/08/2014  . Physical deconditioning 03/08/2014  . Abnormal serum protein electrophoresis 01/07/2013  . Chronic kidney disease (CKD), stage III (moderate) 01/03/2013  . Polymyalgia rheumatica ([place]) 01/03/2013  . Osteopenia 01/03/2013  . Carotid artery disease ([place]) 01/03/2013  . Spinal stenosis of lumbar region 01/03/2013  . Coronary artery disease 01/03/2013  . Old MI (myocardial infarction) 01/03/2013  . Transient cerebral ischemia 11/18/2011    Past Surgical History:  Procedure Laterality Date  . CHOLECYSTECTOMY    . CORONARY ARTERY BYPASS GRAFT    . KIDNEY SURGERY     kidney removed       Home Medications    Prior to Admission medications   Medication Sig Start Date End Date Taking? Authorizing Provider  acetaminophen (TYLENOL) 500 MG tablet Take 1,000 mg by mouth 2 (two) times daily.    Yes [provider]  Calcium-Magnesium-Vitamin D (CALCIUM 500 PO) Take 500 mg by mouth daily.   Yes [provider]  clopidogrel (PLAVIX) 75 MG tablet Take 75 mg by mouth daily.     Yes [provider]  feeding supplement, ENSURE ENLIVE, (ENSURE ENLIVE) LIQD Take 237 mLs by mouth 2 (two) times daily between meals. 05/12/16  Yes Jani Gravel, MD  folic acid (FOLVITE) 1 MG tablet Take 1 mg by mouth daily.   Yes [provider]  furosemide (LASIX) 20 MG tablet  Take 0.5 tablets (10 mg total) by mouth daily. Resume on Saturday 01/31/2017. 01/31/17  Yes Eugenie Filler, MD  gabapentin (NEURONTIN) 100 MG capsule Take 200 mg by mouth 3 (three) times daily.    Yes [provider]  methotrexate (RHEUMATREX) 2.5 MG tablet Take 10 mg by mouth every Wednesday.   Yes [provider]  Multiple Vitamins-Minerals (MULTIVITAMINS THER. W/MINERALS) TABS Take 1 tablet by mouth daily.     Yes [provider]  predniSONE (DELTASONE) 5 MG tablet Take 5 mg by mouth daily.   Yes [provider]  SYNTHROID 100 MCG tablet Take 100 mcg by mouth daily. 03/09/17  Yes [provider]  traMADol (ULTRAM) 50 MG tablet Take one tablet by mouth twice daily for pains PRN Patient taking differently: Take 50 mg by mouth every 8 (eight) hours as needed for moderate pain.  09/29/15  Yes Regalado, Belkys A, MD  bisacodyl (DULCOLAX) 5 MG EC tablet Take 1 tablet (5 mg total) by mouth daily as needed for moderate constipation. Patient not taking: Reported on 04/04/2017 01/28/17   Eugenie Filler, MD  polyethylene glycol [facility] / Floria Raveling) packet Take 17 g by mouth daily as needed for mild constipation.     [provider]    Family History Family History  Problem Relation Age of Onset  . Other Mother        [place] Flu  . Heart disease Father     Social History Social History  Substance Use Topics  . Smoking status: Former Smoker    Types: Cigarettes    Quit date: 11/10/1970  . Smokeless tobacco: Never Used  . Alcohol use No     Allergies   Penicillins   Review of Systems Review of Systems All other systems reviewed and are negative for acute change except as noted in the HPI.   Physical Exam Updated Vital Signs BP (!) 157/47   Pulse (!) 57   Resp 18   Ht 5\' 11"  (1.803 m)   Wt 179 lb (81.2 kg)   SpO2 97%   BMI 24.97 kg/m   Physical Exam  Constitutional: He is oriented to person, place, and time. He appears well-developed and well-nourished.  HENT:  Head: Normocephalic.  Eyes: EOM are normal.  Neck: Normal range of motion.  Cardiovascular: Normal rate and regular rhythm.   Murmur heard.  Systolic murmur is present  Pulmonary/Chest: Effort normal and breath sounds normal. No respiratory distress.  Sternotomy scar.  Abdominal: Soft. He exhibits no distension. There is tenderness in the suprapubic area. No hernia.  Bladder does not feel  distended. No inguinal hernia.  Musculoskeletal: Normal range of motion.  Neurological: He is alert and oriented to person, place, and time.  Psychiatric: He has a normal mood and affect.  Nursing note and vitals reviewed.    ED Treatments / Results  DIAGNOSTIC STUDIES: Oxygen Saturation is 97% on RA, normal by my interpretation.   COORDINATION OF CARE: 10:08 AM- Will order pain medication and labs. Pt verbalizes understanding and agrees to plan.  Medications  morphine 4 MG/ML injection 4 mg (not administered)  iopamidol (ISOVUE-300) 61 % injection (75 mLs  Contrast Given 04/04/17 1338)  sulfamethoxazole-trimethoprim (BACTRIM DS,SEPTRA DS) 800-160 MG per tablet 1 tablet (1 tablet Oral Given 04/04/17 1359)    Labs (all labs ordered are listed, but only abnormal results are displayed) Labs Reviewed  URINE CULTURE - Abnormal; Notable for the following:  Result Value   Culture >=100,000 COLONIES/mL ENTEROCOCCUS FAECALIS (*)    Organism ID, Bacteria ENTEROCOCCUS FAECALIS (*)    All other components within normal limits  COMPREHENSIVE METABOLIC PANEL - Abnormal; Notable for the following:    BUN 33 (*)    Creatinine, Ser 1.53 (*)    Calcium 8.7 (*)    Total Protein 5.3 (*)    Albumin 2.9 (*)    ALT 11 (*)    GFR calc non Af Amer 37 (*)    GFR calc Af Amer 43 (*)    All other components within normal limits  CBC - Abnormal; Notable for the following:    RBC 3.58 (*)    Hemoglobin 12.4 (*)    HCT 38.7 (*)    MCV 108.1 (*)    MCH 34.6 (*)    All other components within normal limits  URINALYSIS, ROUTINE W REFLEX MICROSCOPIC - Abnormal; Notable for the following:    APPearance TURBID (*)    Hgb urine dipstick MODERATE (*)    Protein, ur 30 (*)    Leukocytes, UA LARGE (*)    All other components within normal limits  LIPASE, BLOOD    EKG  EKG Interpretation None       Radiology Ct Abdomen Pelvis W Contrast  Result Date: 04/04/2017 CLINICAL DATA:  Right lower  quadrant pain for 3 weeks, worsened today. EXAM: CT ABDOMEN AND PELVIS WITH CONTRAST TECHNIQUE: Multidetector CT imaging of the abdomen and pelvis was performed using the standard protocol following bolus administration of intravenous contrast. CONTRAST:  65mL ISOVUE-300 IOPAMIDOL (ISOVUE-300) INJECTION 61% COMPARISON:  None. FINDINGS: Lower chest: Small bilateral effusions and associated atelectasis. Cardiomegaly. No other acute abnormalities seen within the lung bases. Hepatobiliary: Hepatic steatosis. The patient is status post cholecystectomy. The portal vein is patent. Pancreas: Unremarkable. No pancreatic ductal dilatation or surrounding inflammatory changes. Spleen: Normal in size without focal abnormality. Adrenals/Urinary Tract: The adrenal glands are normal. The patient is status post left nephrectomy. There is mild hydronephrosis on the right with a prominent right renal pelvis but with normal caliber ureter. This is consistent with a low grade partial UPJ obstruction, probably chronic. There is a tiny cyst in the right kidney. The right ureter is normal in appearance. The bladder is mildly thick walled with mild increased attenuation in adjacent fat near the dome. No focal masses. Stomach/Bowel: The stomach and small bowel are normal. Colonic diverticulosis is seen without diverticulitis. Moderate fecal loading in the colon. Vascular/Lymphatic: Atherosclerotic changes seen in the non aneurysmal aorta as well as the iliac and femoral vessels. No adenopathy. Reproductive: Prostate is unremarkable. Other: There is increased attenuation in the fat posteriorly in the pelvis, posterior to the rectum. The adjacent rectum is normal in appearance. I suspect this is simply settling posteriorly in the pelvis and does not arise from a rectal abnormality. Musculoskeletal: No acute abnormality. Degenerative and scoliotic changes. IMPRESSION: 1. Mild bladder wall thickening with increased attenuation in the adjacent  fat could be seen in the setting of a bladder infection. Recommend correlation with urinalysis. 2. Increased attenuation posteriorly in the pelvis does not appear to arise from the rectum. This is a nonspecific finding. 3. 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 4. Atherosclerosis. 5. No other acute abnormalities. Electronically Signed   By: Dorise Bullion III M.D   On: 04/04/2017 14:09    Procedures Procedures (including critical care time)  Medications Ordered in ED Medications  morphine 4 MG/ML injection  4 mg (not administered)  iopamidol (ISOVUE-300) 61 % injection (75 mLs  Contrast Given 04/04/17 1338)  sulfamethoxazole-trimethoprim (BACTRIM DS,SEPTRA DS) 800-160 MG per tablet 1 tablet (1 tablet Oral Given 04/04/17 1359)     Initial Impression / Assessment and Plan / ED Course  I have reviewed the triage vital signs and the nursing notes.  Pertinent labs & imaging results that were available during my care of the patient were reviewed by me and considered in my medical decision making (see chart for details).     89yM with lower abdominal pain. Will tx for UTI. I feel appropriate for outpt tx.   Final Clinical Impressions(s) / ED Diagnoses   Final diagnoses:  Acute cystitis without hematuria  Lower abdominal pain    New Prescriptions New Prescriptions   No medications on file    I personally preformed the services scribed in my presence. The recorded information has been reviewed is accurate. Virgel Manifold, MD.     Virgel Manifold, MD 04/12/17 [phone]

## 2017-04-04 NOTE — ED Notes (Signed)
Patient transported to CT 

## 2017-04-04 NOTE — ED Notes (Signed)
Pt states pain has improved andf choses to hold pain meds at this time.

## 2017-04-04 NOTE — ED Notes (Signed)
Caregiver given discharge instructions as well as wife and patient. All verbalized understanding.

## 2017-04-04 NOTE — ED Notes (Signed)
Condom catheter applied.

## 2017-04-06 LAB — URINE CULTURE: Culture: >=100000 — AB

## 2017-04-07 NOTE — Progress Notes (Signed)
ED Antimicrobial Stewardship Positive Culture Follow Up   Barry Wright is an 83 y.o. male who presented to [facility] on 04/04/2017 with a chief complaint of  Chief Complaint  Patient presents with  . Abdominal Pain    Recent Results (from the past 720 hour(s))  Urine culture     Status: Abnormal   Collection Time: 04/04/17 12:16 PM  Result Value Ref Range Status   Specimen Description URINE, CLEAN CATCH  Final   Special Requests NONE  Final   Culture >=100,000 COLONIES/mL ENTEROCOCCUS FAECALIS (A)  Final   Report Status 04/06/2017 FINAL  Final   Organism ID, Bacteria ENTEROCOCCUS FAECALIS (A)  Final      Susceptibility   Enterococcus faecalis - MIC*    AMPICILLIN <=2 SENSITIVE Sensitive     LEVOFLOXACIN >=8 RESISTANT Resistant     NITROFURANTOIN <=16 SENSITIVE Sensitive     VANCOMYCIN 1 SENSITIVE Sensitive     * >=100,000 COLONIES/mL ENTEROCOCCUS FAECALIS    [x]  Treated with Ciprofloxacin, organism resistant to prescribed antimicrobial.   Plan: Discontinue Ciprofloxacin 500mg  tablets. Call to recheck if patient has urinary symptoms, if so initiate: Macrobid 100 mg BID PO x 5 days.   ED Provider: Bernarda Caffey, PA-C  [name] 04/07/2017, 9:39 AM Student Pharmacist  I agree with the above note. Heide Guile, PharmD, BCPS-AQ ID Clinical Pharmacist Pager [phone]

## 2017-04-08 DIAGNOSIS — M353 Polymyalgia rheumatica: Secondary | ICD-10-CM | POA: Diagnosis not present

## 2017-04-08 DIAGNOSIS — I679 Cerebrovascular disease, unspecified: Secondary | ICD-10-CM | POA: Diagnosis not present

## 2017-04-08 DIAGNOSIS — I5030 Unspecified diastolic (congestive) heart failure: Secondary | ICD-10-CM | POA: Diagnosis not present

## 2017-04-08 DIAGNOSIS — I509 Heart failure, unspecified: Secondary | ICD-10-CM | POA: Diagnosis not present

## 2017-04-08 DIAGNOSIS — I25119 Atherosclerotic heart disease of native coronary artery with unspecified angina pectoris: Secondary | ICD-10-CM | POA: Diagnosis not present

## 2017-04-08 DIAGNOSIS — I359 Nonrheumatic aortic valve disorder, unspecified: Secondary | ICD-10-CM | POA: Diagnosis not present

## 2017-04-09 DIAGNOSIS — I5030 Unspecified diastolic (congestive) heart failure: Secondary | ICD-10-CM | POA: Diagnosis not present

## 2017-04-09 DIAGNOSIS — M353 Polymyalgia rheumatica: Secondary | ICD-10-CM | POA: Diagnosis not present

## 2017-04-09 DIAGNOSIS — I359 Nonrheumatic aortic valve disorder, unspecified: Secondary | ICD-10-CM | POA: Diagnosis not present

## 2017-04-09 DIAGNOSIS — I25119 Atherosclerotic heart disease of native coronary artery with unspecified angina pectoris: Secondary | ICD-10-CM | POA: Diagnosis not present

## 2017-04-09 DIAGNOSIS — I509 Heart failure, unspecified: Secondary | ICD-10-CM | POA: Diagnosis not present

## 2017-04-09 DIAGNOSIS — I679 Cerebrovascular disease, unspecified: Secondary | ICD-10-CM | POA: Diagnosis not present

## 2017-04-10 DIAGNOSIS — I10 Essential (primary) hypertension: Secondary | ICD-10-CM | POA: Diagnosis not present

## 2017-04-10 DIAGNOSIS — I25119 Atherosclerotic heart disease of native coronary artery with unspecified angina pectoris: Secondary | ICD-10-CM | POA: Diagnosis not present

## 2017-04-10 DIAGNOSIS — I5030 Unspecified diastolic (congestive) heart failure: Secondary | ICD-10-CM | POA: Diagnosis not present

## 2017-04-10 DIAGNOSIS — I359 Nonrheumatic aortic valve disorder, unspecified: Secondary | ICD-10-CM | POA: Diagnosis not present

## 2017-04-10 DIAGNOSIS — I509 Heart failure, unspecified: Secondary | ICD-10-CM | POA: Diagnosis not present

## 2017-04-10 DIAGNOSIS — N390 Urinary tract infection, site not specified: Secondary | ICD-10-CM | POA: Diagnosis not present

## 2017-04-10 DIAGNOSIS — N183 Chronic kidney disease, stage 3 (moderate): Secondary | ICD-10-CM | POA: Diagnosis not present

## 2017-04-10 DIAGNOSIS — L8992 Pressure ulcer of unspecified site, stage 2: Secondary | ICD-10-CM | POA: Diagnosis not present

## 2017-04-10 DIAGNOSIS — M353 Polymyalgia rheumatica: Secondary | ICD-10-CM | POA: Diagnosis not present

## 2017-04-10 DIAGNOSIS — G4733 Obstructive sleep apnea (adult) (pediatric): Secondary | ICD-10-CM | POA: Diagnosis not present

## 2017-04-10 DIAGNOSIS — E039 Hypothyroidism, unspecified: Secondary | ICD-10-CM | POA: Diagnosis not present

## 2017-04-10 DIAGNOSIS — I679 Cerebrovascular disease, unspecified: Secondary | ICD-10-CM | POA: Diagnosis not present

## 2017-04-13 DIAGNOSIS — I679 Cerebrovascular disease, unspecified: Secondary | ICD-10-CM | POA: Diagnosis not present

## 2017-04-13 DIAGNOSIS — I359 Nonrheumatic aortic valve disorder, unspecified: Secondary | ICD-10-CM | POA: Diagnosis not present

## 2017-04-13 DIAGNOSIS — M353 Polymyalgia rheumatica: Secondary | ICD-10-CM | POA: Diagnosis not present

## 2017-04-13 DIAGNOSIS — I25119 Atherosclerotic heart disease of native coronary artery with unspecified angina pectoris: Secondary | ICD-10-CM | POA: Diagnosis not present

## 2017-04-13 DIAGNOSIS — I5030 Unspecified diastolic (congestive) heart failure: Secondary | ICD-10-CM | POA: Diagnosis not present

## 2017-04-13 DIAGNOSIS — I509 Heart failure, unspecified: Secondary | ICD-10-CM | POA: Diagnosis not present

## 2017-04-15 ENCOUNTER — Telehealth: Payer: Self-pay | Admitting: *Deleted

## 2017-04-15 DIAGNOSIS — I5030 Unspecified diastolic (congestive) heart failure: Secondary | ICD-10-CM | POA: Diagnosis not present

## 2017-04-15 DIAGNOSIS — I679 Cerebrovascular disease, unspecified: Secondary | ICD-10-CM | POA: Diagnosis not present

## 2017-04-15 DIAGNOSIS — I509 Heart failure, unspecified: Secondary | ICD-10-CM | POA: Diagnosis not present

## 2017-04-15 DIAGNOSIS — I359 Nonrheumatic aortic valve disorder, unspecified: Secondary | ICD-10-CM | POA: Diagnosis not present

## 2017-04-15 DIAGNOSIS — M353 Polymyalgia rheumatica: Secondary | ICD-10-CM | POA: Diagnosis not present

## 2017-04-15 DIAGNOSIS — I25119 Atherosclerotic heart disease of native coronary artery with unspecified angina pectoris: Secondary | ICD-10-CM | POA: Diagnosis not present

## 2017-04-15 NOTE — Telephone Encounter (Signed)
Contacted by patient in response to letter sent to home.  States symptoms are resolved and feels much better,  No further treatment indicated at this time.

## 2017-04-21 DIAGNOSIS — M353 Polymyalgia rheumatica: Secondary | ICD-10-CM | POA: Diagnosis not present

## 2017-04-21 DIAGNOSIS — I509 Heart failure, unspecified: Secondary | ICD-10-CM | POA: Diagnosis not present

## 2017-04-21 DIAGNOSIS — I679 Cerebrovascular disease, unspecified: Secondary | ICD-10-CM | POA: Diagnosis not present

## 2017-04-21 DIAGNOSIS — I359 Nonrheumatic aortic valve disorder, unspecified: Secondary | ICD-10-CM | POA: Diagnosis not present

## 2017-04-21 DIAGNOSIS — I25119 Atherosclerotic heart disease of native coronary artery with unspecified angina pectoris: Secondary | ICD-10-CM | POA: Diagnosis not present

## 2017-04-21 DIAGNOSIS — I5030 Unspecified diastolic (congestive) heart failure: Secondary | ICD-10-CM | POA: Diagnosis not present

## 2017-04-22 DIAGNOSIS — I679 Cerebrovascular disease, unspecified: Secondary | ICD-10-CM | POA: Diagnosis not present

## 2017-04-22 DIAGNOSIS — I25119 Atherosclerotic heart disease of native coronary artery with unspecified angina pectoris: Secondary | ICD-10-CM | POA: Diagnosis not present

## 2017-04-22 DIAGNOSIS — M353 Polymyalgia rheumatica: Secondary | ICD-10-CM | POA: Diagnosis not present

## 2017-04-22 DIAGNOSIS — I359 Nonrheumatic aortic valve disorder, unspecified: Secondary | ICD-10-CM | POA: Diagnosis not present

## 2017-04-22 DIAGNOSIS — I5030 Unspecified diastolic (congestive) heart failure: Secondary | ICD-10-CM | POA: Diagnosis not present

## 2017-04-22 DIAGNOSIS — I509 Heart failure, unspecified: Secondary | ICD-10-CM | POA: Diagnosis not present

## 2017-05-01 DIAGNOSIS — I359 Nonrheumatic aortic valve disorder, unspecified: Secondary | ICD-10-CM | POA: Diagnosis not present

## 2017-05-01 DIAGNOSIS — M353 Polymyalgia rheumatica: Secondary | ICD-10-CM | POA: Diagnosis not present

## 2017-05-01 DIAGNOSIS — I509 Heart failure, unspecified: Secondary | ICD-10-CM | POA: Diagnosis not present

## 2017-05-01 DIAGNOSIS — I25119 Atherosclerotic heart disease of native coronary artery with unspecified angina pectoris: Secondary | ICD-10-CM | POA: Diagnosis not present

## 2017-05-01 DIAGNOSIS — I5030 Unspecified diastolic (congestive) heart failure: Secondary | ICD-10-CM | POA: Diagnosis not present

## 2017-05-01 DIAGNOSIS — I679 Cerebrovascular disease, unspecified: Secondary | ICD-10-CM | POA: Diagnosis not present

## 2017-05-05 DIAGNOSIS — I25119 Atherosclerotic heart disease of native coronary artery with unspecified angina pectoris: Secondary | ICD-10-CM | POA: Diagnosis not present

## 2017-05-05 DIAGNOSIS — I509 Heart failure, unspecified: Secondary | ICD-10-CM | POA: Diagnosis not present

## 2017-05-05 DIAGNOSIS — M353 Polymyalgia rheumatica: Secondary | ICD-10-CM | POA: Diagnosis not present

## 2017-05-05 DIAGNOSIS — I359 Nonrheumatic aortic valve disorder, unspecified: Secondary | ICD-10-CM | POA: Diagnosis not present

## 2017-05-05 DIAGNOSIS — I5030 Unspecified diastolic (congestive) heart failure: Secondary | ICD-10-CM | POA: Diagnosis not present

## 2017-05-05 DIAGNOSIS — I679 Cerebrovascular disease, unspecified: Secondary | ICD-10-CM | POA: Diagnosis not present

## 2017-05-06 DIAGNOSIS — I359 Nonrheumatic aortic valve disorder, unspecified: Secondary | ICD-10-CM | POA: Diagnosis not present

## 2017-05-06 DIAGNOSIS — I679 Cerebrovascular disease, unspecified: Secondary | ICD-10-CM | POA: Diagnosis not present

## 2017-05-06 DIAGNOSIS — I509 Heart failure, unspecified: Secondary | ICD-10-CM | POA: Diagnosis not present

## 2017-05-06 DIAGNOSIS — M353 Polymyalgia rheumatica: Secondary | ICD-10-CM | POA: Diagnosis not present

## 2017-05-06 DIAGNOSIS — I25119 Atherosclerotic heart disease of native coronary artery with unspecified angina pectoris: Secondary | ICD-10-CM | POA: Diagnosis not present

## 2017-05-06 DIAGNOSIS — I5030 Unspecified diastolic (congestive) heart failure: Secondary | ICD-10-CM | POA: Diagnosis not present

## 2017-05-10 DIAGNOSIS — I5030 Unspecified diastolic (congestive) heart failure: Secondary | ICD-10-CM | POA: Diagnosis not present

## 2017-05-10 DIAGNOSIS — G4733 Obstructive sleep apnea (adult) (pediatric): Secondary | ICD-10-CM | POA: Diagnosis not present

## 2017-05-10 DIAGNOSIS — E039 Hypothyroidism, unspecified: Secondary | ICD-10-CM | POA: Diagnosis not present

## 2017-05-10 DIAGNOSIS — L8992 Pressure ulcer of unspecified site, stage 2: Secondary | ICD-10-CM | POA: Diagnosis not present

## 2017-05-10 DIAGNOSIS — I509 Heart failure, unspecified: Secondary | ICD-10-CM | POA: Diagnosis not present

## 2017-05-10 DIAGNOSIS — M353 Polymyalgia rheumatica: Secondary | ICD-10-CM | POA: Diagnosis not present

## 2017-05-10 DIAGNOSIS — N390 Urinary tract infection, site not specified: Secondary | ICD-10-CM | POA: Diagnosis not present

## 2017-05-10 DIAGNOSIS — I25119 Atherosclerotic heart disease of native coronary artery with unspecified angina pectoris: Secondary | ICD-10-CM | POA: Diagnosis not present

## 2017-05-10 DIAGNOSIS — I10 Essential (primary) hypertension: Secondary | ICD-10-CM | POA: Diagnosis not present

## 2017-05-10 DIAGNOSIS — I359 Nonrheumatic aortic valve disorder, unspecified: Secondary | ICD-10-CM | POA: Diagnosis not present

## 2017-05-10 DIAGNOSIS — I679 Cerebrovascular disease, unspecified: Secondary | ICD-10-CM | POA: Diagnosis not present

## 2017-05-10 DIAGNOSIS — N183 Chronic kidney disease, stage 3 (moderate): Secondary | ICD-10-CM | POA: Diagnosis not present

## 2017-05-14 DIAGNOSIS — I509 Heart failure, unspecified: Secondary | ICD-10-CM | POA: Diagnosis not present

## 2017-05-14 DIAGNOSIS — I359 Nonrheumatic aortic valve disorder, unspecified: Secondary | ICD-10-CM | POA: Diagnosis not present

## 2017-05-14 DIAGNOSIS — I25119 Atherosclerotic heart disease of native coronary artery with unspecified angina pectoris: Secondary | ICD-10-CM | POA: Diagnosis not present

## 2017-05-14 DIAGNOSIS — M353 Polymyalgia rheumatica: Secondary | ICD-10-CM | POA: Diagnosis not present

## 2017-05-14 DIAGNOSIS — I5030 Unspecified diastolic (congestive) heart failure: Secondary | ICD-10-CM | POA: Diagnosis not present

## 2017-05-14 DIAGNOSIS — I679 Cerebrovascular disease, unspecified: Secondary | ICD-10-CM | POA: Diagnosis not present

## 2017-05-15 DIAGNOSIS — M353 Polymyalgia rheumatica: Secondary | ICD-10-CM | POA: Diagnosis not present

## 2017-05-15 DIAGNOSIS — I509 Heart failure, unspecified: Secondary | ICD-10-CM | POA: Diagnosis not present

## 2017-05-15 DIAGNOSIS — I359 Nonrheumatic aortic valve disorder, unspecified: Secondary | ICD-10-CM | POA: Diagnosis not present

## 2017-05-15 DIAGNOSIS — I25119 Atherosclerotic heart disease of native coronary artery with unspecified angina pectoris: Secondary | ICD-10-CM | POA: Diagnosis not present

## 2017-05-15 DIAGNOSIS — I5030 Unspecified diastolic (congestive) heart failure: Secondary | ICD-10-CM | POA: Diagnosis not present

## 2017-05-15 DIAGNOSIS — I679 Cerebrovascular disease, unspecified: Secondary | ICD-10-CM | POA: Diagnosis not present

## 2017-05-27 ENCOUNTER — Inpatient Hospital Stay (HOSPITAL_COMMUNITY)
Admission: EM | Admit: 2017-05-27 | Discharge: 2017-05-29 | DRG: 871 | Disposition: A | Discharge status: Home Health | Payer: Medicare Other | Attending: Internal Medicine | Admitting: Internal Medicine

## 2017-05-27 ENCOUNTER — Emergency Department (HOSPITAL_COMMUNITY): Payer: Medicare Other

## 2017-05-27 ENCOUNTER — Encounter (HOSPITAL_COMMUNITY): Payer: Self-pay | Admitting: Emergency Medicine

## 2017-05-27 DIAGNOSIS — Z79899 Other long term (current) drug therapy: Secondary | ICD-10-CM

## 2017-05-27 DIAGNOSIS — L89890 Pressure ulcer of other site, unstageable: Secondary | ICD-10-CM | POA: Diagnosis present

## 2017-05-27 DIAGNOSIS — Z66 Do not resuscitate: Secondary | ICD-10-CM | POA: Diagnosis present

## 2017-05-27 DIAGNOSIS — Z7952 Long term (current) use of systemic steroids: Secondary | ICD-10-CM

## 2017-05-27 DIAGNOSIS — E039 Hypothyroidism, unspecified: Secondary | ICD-10-CM | POA: Diagnosis present

## 2017-05-27 DIAGNOSIS — I509 Heart failure, unspecified: Secondary | ICD-10-CM | POA: Diagnosis not present

## 2017-05-27 DIAGNOSIS — B952 Enterococcus as the cause of diseases classified elsewhere: Secondary | ICD-10-CM | POA: Diagnosis present

## 2017-05-27 DIAGNOSIS — Z880 Allergy status to penicillin: Secondary | ICD-10-CM

## 2017-05-27 DIAGNOSIS — A419 Sepsis, unspecified organism: Secondary | ICD-10-CM | POA: Diagnosis present

## 2017-05-27 DIAGNOSIS — R5381 Other malaise: Secondary | ICD-10-CM

## 2017-05-27 DIAGNOSIS — G9340 Encephalopathy, unspecified: Secondary | ICD-10-CM | POA: Diagnosis not present

## 2017-05-27 DIAGNOSIS — Z7401 Bed confinement status: Secondary | ICD-10-CM

## 2017-05-27 DIAGNOSIS — A4181 Sepsis due to Enterococcus: Secondary | ICD-10-CM | POA: Diagnosis not present

## 2017-05-27 DIAGNOSIS — Z8673 Personal history of transient ischemic attack (TIA), and cerebral infarction without residual deficits: Secondary | ICD-10-CM

## 2017-05-27 DIAGNOSIS — M549 Dorsalgia, unspecified: Secondary | ICD-10-CM | POA: Diagnosis present

## 2017-05-27 DIAGNOSIS — R4182 Altered mental status, unspecified: Secondary | ICD-10-CM | POA: Diagnosis not present

## 2017-05-27 DIAGNOSIS — Z87891 Personal history of nicotine dependence: Secondary | ICD-10-CM

## 2017-05-27 DIAGNOSIS — I252 Old myocardial infarction: Secondary | ICD-10-CM

## 2017-05-27 DIAGNOSIS — I447 Left bundle-branch block, unspecified: Secondary | ICD-10-CM | POA: Diagnosis present

## 2017-05-27 DIAGNOSIS — J90 Pleural effusion, not elsewhere classified: Secondary | ICD-10-CM | POA: Diagnosis not present

## 2017-05-27 DIAGNOSIS — Z951 Presence of aortocoronary bypass graft: Secondary | ICD-10-CM

## 2017-05-27 DIAGNOSIS — G20 Parkinson's disease: Secondary | ICD-10-CM | POA: Diagnosis present

## 2017-05-27 DIAGNOSIS — I130 Hypertensive heart and chronic kidney disease with heart failure and stage 1 through stage 4 chronic kidney disease, or unspecified chronic kidney disease: Secondary | ICD-10-CM | POA: Diagnosis not present

## 2017-05-27 DIAGNOSIS — N183 Chronic kidney disease, stage 3 unspecified: Secondary | ICD-10-CM | POA: Diagnosis present

## 2017-05-27 DIAGNOSIS — I6529 Occlusion and stenosis of unspecified carotid artery: Secondary | ICD-10-CM | POA: Diagnosis present

## 2017-05-27 DIAGNOSIS — I2510 Atherosclerotic heart disease of native coronary artery without angina pectoris: Secondary | ICD-10-CM | POA: Diagnosis present

## 2017-05-27 DIAGNOSIS — N390 Urinary tract infection, site not specified: Secondary | ICD-10-CM | POA: Diagnosis present

## 2017-05-27 DIAGNOSIS — R402411 Glasgow coma scale score 13-15, in the field [EMT or ambulance]: Secondary | ICD-10-CM | POA: Diagnosis not present

## 2017-05-27 DIAGNOSIS — L89620 Pressure ulcer of left heel, unstageable: Secondary | ICD-10-CM

## 2017-05-27 DIAGNOSIS — M353 Polymyalgia rheumatica: Secondary | ICD-10-CM | POA: Diagnosis present

## 2017-05-27 DIAGNOSIS — G8929 Other chronic pain: Secondary | ICD-10-CM | POA: Diagnosis present

## 2017-05-27 DIAGNOSIS — Z905 Acquired absence of kidney: Secondary | ICD-10-CM

## 2017-05-27 LAB — DIFFERENTIAL
Basophils Absolute: 0 10*3/uL (ref 0.0–0.1)
Basophils Relative: 0 %
Eosinophils Absolute: 0.1 10*3/uL (ref 0.0–0.7)
Eosinophils Relative: 1 %
LYMPHS ABS: 2.7 10*3/uL (ref 0.7–4.0)
LYMPHS PCT: 26 %
MONO ABS: 1 10*3/uL (ref 0.1–1.0)
Monocytes Relative: 9 %
NEUTROS ABS: 6.7 10*3/uL (ref 1.7–7.7)
Neutrophils Relative %: 64 %

## 2017-05-27 LAB — COMPREHENSIVE METABOLIC PANEL
ALK PHOS: 77 U/L (ref 38–126)
ALT: 16 U/L — AB (ref 17–63)
AST: 21 U/L (ref 15–41)
Albumin: 2.9 g/dL — ABNORMAL LOW (ref 3.5–5.0)
Anion gap: 12 (ref 5–15)
BUN: 25 mg/dL — AB (ref 6–20)
CALCIUM: 8.7 mg/dL — AB (ref 8.9–10.3)
CO2: 27 mmol/L (ref 22–32)
CREATININE: 1.33 mg/dL — AB (ref 0.61–1.24)
Chloride: 103 mmol/L (ref 101–111)
GFR calc Af Amer: 51 mL/min — ABNORMAL LOW (ref >60)
GFR, EST NON AFRICAN AMERICAN: 44 mL/min — AB (ref >60)
Glucose, Bld: 114 mg/dL — ABNORMAL HIGH (ref 65–99)
Potassium: 4.2 mmol/L (ref 3.5–5.1)
Sodium: 142 mmol/L (ref 135–145)
Total Bilirubin: 0.4 mg/dL (ref 0.3–1.2)
Total Protein: 6 g/dL — ABNORMAL LOW (ref 6.5–8.1)

## 2017-05-27 LAB — URINALYSIS, ROUTINE W REFLEX MICROSCOPIC
Bilirubin Urine: NEGATIVE
Glucose, UA: NEGATIVE mg/dL
KETONES UR: NEGATIVE mg/dL
Nitrite: NEGATIVE
PROTEIN: 30 mg/dL — AB
Specific Gravity, Urine: 1.015 (ref 1.005–1.030)
pH: 5 (ref 5.0–8.0)

## 2017-05-27 LAB — CBC
HCT: 40.6 % (ref 39.0–52.0)
Hemoglobin: 13.9 g/dL (ref 13.0–17.0)
MCH: 36.3 pg — AB (ref 26.0–34.0)
MCHC: 34.2 g/dL (ref 30.0–36.0)
MCV: 106 fL — ABNORMAL HIGH (ref 78.0–100.0)
PLATELETS: 153 10*3/uL (ref 150–400)
RBC: 3.83 MIL/uL — AB (ref 4.22–5.81)
RDW: 14 % (ref 11.5–15.5)
WBC: 10.5 10*3/uL (ref 4.0–10.5)

## 2017-05-27 LAB — I-STAT CG4 LACTIC ACID, ED
LACTIC ACID, VENOUS: 1.04 mmol/L (ref 0.5–1.9)
Lactic Acid, Venous: 2.6 mmol/L (ref 0.5–1.9)

## 2017-05-27 LAB — PROTIME-INR
INR: 0.94
Prothrombin Time: 12.6 seconds (ref 11.4–15.2)

## 2017-05-27 LAB — CBG MONITORING, ED: GLUCOSE-CAPILLARY: 96 mg/dL (ref 65–99)

## 2017-05-27 MED ORDER — VANCOMYCIN HCL IN DEXTROSE 1-5 GM/200ML-% IV SOLN
1000.0000 mg | INTRAVENOUS | Status: DC
  Administered 2017-05-28 (×2): 1000 mg via INTRAVENOUS
  Filled 2017-05-27 (×2): qty 200

## 2017-05-27 MED ORDER — ACETAMINOPHEN 650 MG RE SUPP
650.0000 mg | RECTAL | Status: AC
  Administered 2017-05-27: 650 mg via RECTAL
  Filled 2017-05-27: qty 1

## 2017-05-27 MED ORDER — LEVOTHYROXINE SODIUM 100 MCG PO TABS
100.0000 ug | ORAL_TABLET | Freq: Every day | ORAL | Status: DC
  Administered 2017-05-27 – 2017-05-29 (×3): 100 ug via ORAL
  Filled 2017-05-27 (×3): qty 1

## 2017-05-27 MED ORDER — SODIUM CHLORIDE 0.9 % IV BOLUS (SEPSIS)
1000.0000 mL | Freq: Once | INTRAVENOUS | Status: AC
  Administered 2017-05-27: 1000 mL via INTRAVENOUS

## 2017-05-27 MED ORDER — DEXTROSE 5 % IV SOLN
1.0000 g | Freq: Three times a day (TID) | INTRAVENOUS | Status: DC
  Filled 2017-05-27: qty 1

## 2017-05-27 MED ORDER — FUROSEMIDE 20 MG PO TABS
10.0000 mg | ORAL_TABLET | Freq: Every day | ORAL | Status: DC
  Administered 2017-05-27 – 2017-05-29 (×3): 10 mg via ORAL
  Filled 2017-05-27 (×3): qty 1

## 2017-05-27 MED ORDER — VANCOMYCIN HCL IN DEXTROSE 1-5 GM/200ML-% IV SOLN
1000.0000 mg | Freq: Once | INTRAVENOUS | Status: AC
  Administered 2017-05-27: 1000 mg via INTRAVENOUS
  Filled 2017-05-27: qty 200

## 2017-05-27 MED ORDER — SODIUM CHLORIDE 0.9 % IV SOLN
INTRAVENOUS | Status: DC
  Administered 2017-05-27: 13:00:00 via INTRAVENOUS

## 2017-05-27 MED ORDER — ORAL CARE MOUTH RINSE
15.0000 mL | Freq: Two times a day (BID) | OROMUCOSAL | Status: DC
  Administered 2017-05-27 – 2017-05-29 (×4): 15 mL via OROMUCOSAL

## 2017-05-27 MED ORDER — DEXTROSE 5 % IV SOLN
1.0000 g | INTRAVENOUS | Status: DC
  Administered 2017-05-27 – 2017-05-28 (×2): 1 g via INTRAVENOUS
  Filled 2017-05-27 (×2): qty 1

## 2017-05-27 MED ORDER — LEVOFLOXACIN IN D5W 750 MG/150ML IV SOLN: 750.0000 mg | INTRAVENOUS | Status: DC

## 2017-05-27 MED ORDER — PREDNISONE 5 MG PO TABS
5.0000 mg | ORAL_TABLET | Freq: Every day | ORAL | Status: DC
  Administered 2017-05-27 – 2017-05-29 (×3): 5 mg via ORAL
  Filled 2017-05-27 (×3): qty 1

## 2017-05-27 MED ORDER — ACETAMINOPHEN 500 MG PO TABS
1000.0000 mg | ORAL_TABLET | Freq: Two times a day (BID) | ORAL | Status: DC
  Administered 2017-05-27 – 2017-05-29 (×5): 1000 mg via ORAL
  Filled 2017-05-27 (×5): qty 2

## 2017-05-27 MED ORDER — LEVOFLOXACIN IN D5W 750 MG/150ML IV SOLN
750.0000 mg | Freq: Once | INTRAVENOUS | Status: AC
  Administered 2017-05-27: 750 mg via INTRAVENOUS
  Filled 2017-05-27: qty 150

## 2017-05-27 MED ORDER — ENOXAPARIN SODIUM 40 MG/0.4ML ~~LOC~~ SOLN
40.0000 mg | SUBCUTANEOUS | Status: DC
  Administered 2017-05-27 – 2017-05-28 (×2): 40 mg via SUBCUTANEOUS
  Filled 2017-05-27 (×2): qty 0.4

## 2017-05-27 MED ORDER — TRAMADOL HCL 50 MG PO TABS: 50.0000 mg | ORAL_TABLET | Freq: Three times a day (TID) | ORAL | Status: DC | PRN

## 2017-05-27 MED ORDER — GABAPENTIN 100 MG PO CAPS
200.0000 mg | ORAL_CAPSULE | Freq: Three times a day (TID) | ORAL | Status: DC
  Administered 2017-05-27 – 2017-05-29 (×6): 200 mg via ORAL
  Filled 2017-05-27 (×6): qty 2

## 2017-05-27 MED ORDER — ENSURE ENLIVE PO LIQD
237.0000 mL | Freq: Two times a day (BID) | ORAL | Status: DC
  Administered 2017-05-27 – 2017-05-29 (×3): 237 mL via ORAL

## 2017-05-27 MED ORDER — DEXTROSE 5 % IV SOLN
2.0000 g | Freq: Once | INTRAVENOUS | Status: AC
  Administered 2017-05-27: 2 g via INTRAVENOUS
  Filled 2017-05-27: qty 2

## 2017-05-27 NOTE — ED Notes (Signed)
Bed: QN99 Expected date:  Expected time:  Means of arrival:  Comments: EMS 83 yo male-altered LOC-last normal 1400-hx frequent UTI-hx Parkinson's

## 2017-05-27 NOTE — ED Provider Notes (Signed)
[facility] DEPT Provider Note   CSN: [id number] Arrival date & time: 05/27/17  0539     History   Chief Complaint Chief Complaint  Patient presents with  . Altered Mental Status    HPI Barry Wright is a 83 y.o. male with past medical history of CKD stage III, left bundle branch block, CVA, MI history of CABG, CHF, hypothyroidism, recurrent UTI w recent admission in May 2018, parkinson dz.  HPI  LEVEL 5 CAVEAT d/t Altered Mental Status  Patient presenting via EMS with altered mental status. Per EMS, patient's caregiver noticed he was not acting normally this morning. He has history of Parkinson, is able to speak and communicate at baseline. No known hx dementia.   Past Medical History:  Diagnosis Date  . CAD (coronary artery disease)   . CHF (congestive heart failure) ([place])   . Chronic back pain   . Chronic kidney disease (CKD), stage III (moderate)   . History of nephrectomy   . Hx of CABG   . Hypercholesteremia   . Hypothyroidism   . Internal carotid artery stenosis   . Left bundle branch block   . Myocardial infarction ([place])   . Polymyalgia rheumatica ([place])   . Stroke [facility])     Patient Active Problem List   Diagnosis Date Noted  . Sepsis ([place]) 05/27/2017  . Lower urinary tract infectious disease   . History of nephrectomy   . Acute lower UTI 02/21/2016  . Pressure ulcer 09/29/2015  . Acute encephalopathy   . Chronic back pain 09/26/2015  . Generalized weakness 04/13/2015  . Chronic diastolic heart failure ([place]) 06/19/2014  . Essential hypertension, benign 03/08/2014  . Hypothyroidism 03/08/2014  . Protein-calorie malnutrition, moderate ([place]) 03/08/2014  . Physical deconditioning 03/08/2014  . Abnormal serum protein electrophoresis 01/07/2013  . Chronic kidney disease (CKD), stage III (moderate) 01/03/2013  . Polymyalgia rheumatica ([place]) 01/03/2013  . Osteopenia 01/03/2013  . Carotid artery disease ([place]) 01/03/2013  . Spinal stenosis of lumbar region  01/03/2013  . Coronary artery disease 01/03/2013  . Old MI (myocardial infarction) 01/03/2013  . Transient cerebral ischemia 11/18/2011    Past Surgical History:  Procedure Laterality Date  . CHOLECYSTECTOMY    . CORONARY ARTERY BYPASS GRAFT    . KIDNEY SURGERY     kidney removed  . NEPHRECTOMY         Home Medications    Prior to Admission medications   Medication Sig Start Date End Date Taking? Authorizing Provider  acetaminophen (TYLENOL) 500 MG tablet Take 1,000 mg by mouth 2 (two) times daily.    Yes [provider]  feeding supplement, ENSURE ENLIVE, (ENSURE ENLIVE) LIQD Take 237 mLs by mouth 2 (two) times daily between meals. 05/12/16  Yes Jani Gravel, MD  furosemide (LASIX) 20 MG tablet Take 0.5 tablets (10 mg total) by mouth daily. Resume on Saturday 01/31/2017. 01/31/17  Yes Eugenie Filler, MD  gabapentin (NEURONTIN) 100 MG capsule Take 200 mg by mouth 3 (three) times daily.    Yes [provider]  methotrexate (RHEUMATREX) 2.5 MG tablet Take 10 mg by mouth every Wednesday.   Yes [provider]  predniSONE (DELTASONE) 5 MG tablet Take 5 mg by mouth daily.   Yes [provider]  SYNTHROID 100 MCG tablet Take 100 mcg by mouth daily. 03/09/17  Yes [provider]  traMADol (ULTRAM) 50 MG tablet Take one tablet by mouth twice daily for pains PRN Patient taking differently: Take 50 mg by  mouth every 8 (eight) hours as needed for moderate pain.  09/29/15  Yes Regalado, Belkys A, MD  ciprofloxacin (CIPRO) 500 MG tablet Take 1 tablet (500 mg total) by mouth every 12 (twelve) hours. Patient not taking: Reported on 05/27/2017 04/04/17   Virgel Manifold, MD    Family History Family History  Problem Relation Age of Onset  . Other Mother        [place] Flu  . Heart disease Father     Social History Social History  Substance Use Topics  . Smoking status: Former Smoker    Types: Cigarettes    Quit date: 11/10/1970  . Smokeless  tobacco: Never Used  . Alcohol use No     Allergies   Penicillins   Review of Systems Review of Systems  Unable to perform ROS: Mental status change     Physical Exam Updated Vital Signs BP (!) 108/54   Pulse 78   Temp 98.3 F (36.8 C)   Resp 19   SpO2 93%   Physical Exam  Constitutional: He appears well-developed. He has a sickly appearance.  HENT:  Head: Normocephalic and atraumatic.  Eyes: Pupils are equal, round, and reactive to light. EOM are normal.  Right conjunctival injection  Cardiovascular: Normal rate, regular rhythm and intact distal pulses.   Murmur (systolic) heard. Pulmonary/Chest: Effort normal and breath sounds normal. No accessory muscle usage or stridor. No respiratory distress. He has no wheezes. He has no rales.  Abdominal: Soft. Normal appearance and bowel sounds are normal. He exhibits no distension and no mass.  Neurological: He is unresponsive.  Patient responding to verbal stimuli; making eye contact when called by name, however inconsistently following small commands, such as sticking out tongue and squeezing w right hand > left. Aphasic. No obvious facial droop.   Skin: Skin is warm and intact.  Psychiatric: He has a normal mood and affect. His behavior is normal.  Nursing note and vitals reviewed.    ED Treatments / Results  Labs (all labs ordered are listed, but only abnormal results are displayed) Labs Reviewed  COMPREHENSIVE METABOLIC PANEL - Abnormal; Notable for the following:       Result Value   Glucose, Bld 114 (*)    BUN 25 (*)    Creatinine, Ser 1.33 (*)    Calcium 8.7 (*)    Total Protein 6.0 (*)    Albumin 2.9 (*)    ALT 16 (*)    GFR calc non Af Amer 44 (*)    GFR calc Af Amer 51 (*)    All other components within normal limits  CBC - Abnormal; Notable for the following:    RBC 3.83 (*)    MCV 106.0 (*)    MCH 36.3 (*)    All other components within normal limits  I-STAT CG4 LACTIC ACID, ED - Abnormal; Notable  for the following:    Lactic Acid, Venous 2.60 (*)    All other components within normal limits  CULTURE, BLOOD (ROUTINE X 2)  CULTURE, BLOOD (ROUTINE X 2)  URINE CULTURE  PROTIME-INR  DIFFERENTIAL  CBC WITH DIFFERENTIAL/PLATELET  URINALYSIS, ROUTINE W REFLEX MICROSCOPIC  CBG MONITORING, ED  I-STAT CG4 LACTIC ACID, ED    EKG  EKG Interpretation None       Radiology Ct Head Wo Contrast  Result Date: 05/27/2017 CLINICAL DATA:  Altered mental status. EXAM: CT HEAD WITHOUT CONTRAST TECHNIQUE: Contiguous axial images were obtained from the base of the skull through  the vertex without intravenous contrast. COMPARISON:  05/10/2016 FINDINGS: Brain: No evidence of acute infarction, hemorrhage, hydrocephalus, extra-axial collection or mass lesion/mass effect. Atrophy with an anterior cerebral predilection. Chronic microvascular ischemia with moderate low-density in the deep cerebral white matter. Vascular: Atherosclerotic calcification.  No hyperdense vessel. Skull: No acute or aggressive finding. Sinuses/Orbits: Secretions within the right maxillary and sphenoid sinuses. Bilateral cataract resection. IMPRESSION: 1. No acute intracranial finding. 2. Chronic small vessel ischemia and frontal predominant atrophy. 3. Secretions in the right maxillary and sphenoid sinuses. Electronically Signed   By: Monte Fantasia M.D.   On: 05/27/2017 08:25   Dg Chest Port 1 View  Result Date: 05/27/2017 CLINICAL DATA:  Altered mental status. EXAM: PORTABLE CHEST 1 VIEW COMPARISON:  05/10/2016 . FINDINGS: Prior CABG. Heart size stable. Interim near complete clearing of pulmonary interstitial edema. Small scratched left pleural effusion again noted. No pneumothorax. IMPRESSION: Prior CABG. Interim near complete clearing of pulmonary interstitial edema. Small left pleural effusion again noted. Electronically Signed   By: Marcello Moores  Register   On: 05/27/2017 07:20    Procedures Procedures (including critical care  time) CRITICAL CARE Performed by: [place] N Russo   Total critical care time: 45 minutes for sepsis  Critical care time was exclusive of separately billable procedures and treating other patients.  Critical care was necessary to treat or prevent imminent or life-threatening deterioration.  Critical care was time spent personally by me on the following activities: development of treatment plan with patient and/or surrogate as well as nursing, discussions with consultants, evaluation of patient's response to treatment, examination of patient, obtaining history from patient or surrogate, ordering and performing treatments and interventions, ordering and review of laboratory studies, ordering and review of radiographic studies, pulse oximetry and re-evaluation of patient's condition.    Medications Ordered in ED Medications  vancomycin (VANCOCIN) IVPB 1000 mg/200 mL premix (not administered)  levofloxacin (LEVAQUIN) IVPB 750 mg (not administered)  aztreonam (AZACTAM) 1 g in dextrose 5 % 50 mL IVPB (not administered)  acetaminophen (TYLENOL) suppository 650 mg (650 mg Rectal Given 05/27/17 0746)  sodium chloride 0.9 % bolus 1,000 mL (0 mLs Intravenous Stopped 05/27/17 0927)  levofloxacin (LEVAQUIN) IVPB 750 mg (0 mg Intravenous Stopped 05/27/17 1026)  aztreonam (AZACTAM) 2 g in dextrose 5 % 50 mL IVPB (0 g Intravenous Stopped 05/27/17 0927)  vancomycin (VANCOCIN) IVPB 1000 mg/200 mL premix (0 mg Intravenous Stopped 05/27/17 1026)  sodium chloride 0.9 % bolus 1,000 mL (1,000 mLs Intravenous New Bag/Given 05/27/17 1761)     Initial Impression / Assessment and Plan / ED Course  I have reviewed the triage vital signs and the nursing notes.  Pertinent labs & imaging results that were available during my care of the patient were reviewed by me and considered in my medical decision making (see chart for details).  Clinical Course as of May 28 1055  Wed May 27, 2017  0841 Spoke with lab regarding  U/A. Will need repeat U/A.  [JR]    Clinical Course User Index [JR] Russo, [place] N, PA-C    Pt presenting from home via EMS with AMS. Pt w PMHx including CVA, MI, parkinson, recurrent UTI, on plavix. Per EMS, pt is verbal with normal communication at baseline. Pt is rectal temp of 103.3, Code sepsis protocol initiated. CBG 96. Pt is hypertensive, not tachycardic. Will respond with eye contact to verbal, however is not consistently following commands. No obvious facial droop. Abd NDNT, lungs clear. CXR without evidence of PNA. CMP  unchanged from previous. WBC 10.5. Lactic acid 2.6. IVF bolus given.  Per RN, urine appears thick and abnl; suggesting possible source of infxn. Levofloxacin and azotreonam started for likely urinary source, given pt's hx of recurrent UTI and neg CXR.  8:30 AM CT head negative. Pt still responsive to verbal with eye contact. Not following simple commands. VSS remain stable.  8:45 AM Will add vancomycin to cover Enterococcus faecalis, per previous urine culture on 04/04/17.   Fever with improvement. HR stable. BP trending down, will administer another fluid bolus.  Pt is stable, not in severe sepsis or in septic shock. Will consult hospitalist for admission for likely urosepsis.  9:46 AM Dr. Sarajane Jews, [name], accepting admission  Patient discussed with and seen by Dr. Jeneen Rinks.  The patient appears reasonably stabilized for admission considering the current resources, flow, and capabilities available in the ED at this time, and I doubt any other [facility] requiring further screening and/or treatment in the ED prior to admission.  Final Clinical Impressions(s) / ED Diagnoses   Final diagnoses:  None    New Prescriptions New Prescriptions   No medications on file     Russo, [place] N, PA-C 05/27/17 1409    Tanna Furry, MD 05/29/17 [phone]

## 2017-05-27 NOTE — ED Triage Notes (Signed)
Pt comes from [facility] giver noticed pt  Has Altered mental status from baseline. Pt is normal able to talk and respond.  Pt has no hx of dementia, but is diagnosed with parkinson. Pt is bed ridden at baseline.  Hx of frequent UTI.  V/s on arrival 163/71, pulse 82, rr16, 96 on room air  cbg 161.  [address]. [place],  Home address

## 2017-05-27 NOTE — Progress Notes (Signed)
Pharmacy Antibiotic Note  Barry Wright is a 83 y.o. male admitted on 05/27/2017 with altered mental status. .  Pharmacy has been consulted for vancomycin, aztreonam and levaquin dosing for presumed UTI given history of frequent UTI with most recent cultures growing enterococcus. Now changing aztreonam/levaquin to cefepime due to patient tolerating Rocephin in past despite PCN allergy and in order to reduce risk of toxicities from FQs in elderly  Hx CKD III, SCr 1.33 with CrCl 34 ml/min/1.38m2 (normalized) Weight 81kg in May 2018 Tmax 103.3 WBC 10.5 Lactate 2.6  Plan:  Continue Vancomycin 1g IV q24h - check trough at steady state, goal 10-15 mcg/ml  Cefepime 1g IV q24 per current renal function    Temp (24hrs), Avg:100.8 F (38.2 C), Min:98.3 F (36.8 C), Max:103.3 F (39.6 C)   Recent Labs Lab 05/27/17 0628 05/27/17 0707 05/27/17 1104  WBC 10.5  --   --   CREATININE 1.33*  --   --   LATICACIDVEN  --  2.60* 1.04    CrCl cannot be calculated (Unknown ideal weight.).    Allergies  Allergen Reactions  . Penicillins Rash and Other (See Comments)    Has patient had a PCN reaction causing immediate rash, facial/tongue/throat swelling, SOB or lightheadedness with hypotension: Unknown Has patient had a PCN reaction causing severe rash involving mucus membranes or skin necrosis: Unknown Has patient had a PCN reaction that required hospitalization: Unknown Has patient had a PCN reaction occurring within the last 10 years: Unknown If all of the above answers are "NO", then may proceed with Cephalosporin use.     Antimicrobials this admission:  7/18 Vanc >> 7/18 Aztreonam >> 7/18 Levaquin >>  Dose adjustments this admission:  ---  Microbiology results:  7/18 BCx: sent 7/18 UCx: sent  Previous cultures: 01/24/17 UCx: > 100k Enterococcus faecalis (S amp, nitro, vanc; R levo) 5/26 UCx: >100k Enterococcus faecalis (S amp, nitro, vanc; R levo) Thank you for allowing  pharmacy to be a part of this patient's care.   Adrian Saran, PharmD, BCPS Pager [phone] 05/27/2017 12:18 PM

## 2017-05-27 NOTE — H&P (Signed)
History and Physical  Barry Wright OEU:[id number] DOB: 1919-12-12 DOA: 05/27/2017  PCP: Lajean Manes, MD  Patient coming from: home   Chief Complaint: Not interacting appropriately  HPI:  83 year old man PMH Parkinson's disease, coronary artery disease, polymyalgia rheumatica, frequent UTI, presented to the emergency department with sudden onset of altered mental status, first noted this morning. Initial evaluation revealed fever, positive SIRS criteria and elevated lactic acid and unremarkable laboratory studies with urinalysis pending. Referred for admission for sepsis, presumably secondary to UTI.  No reported dementia. Wife, nurse and caretaker bedside. Patient has had a cough recently but otherwise has been well with no concerning signs or some ordered. An approximately 4 AM this morning, caretaker was changing him. Typically the patient is conversive and likes to make jokes. However he was quiet this morning and seemed confused. Therefore patient was brought to the emergency department where he was found to be febrile. The patient himself, denies any complaints at this point in the review of systems is negative. No localizing symptoms noted. No specific aggravating or alleviating factors or associated symptoms notable.  ED Course: febrile, VSS, treated with Levaquin, aztreonam, vancomycin, 2 L NS  Review of Systems:  Negative for fever, visual changes, sore throat, rash, new muscle aches, chest pain, SOB, dysuria, bleeding, n/v/abdominal pain.  Past Medical History:  Diagnosis Date  . CAD (coronary artery disease)   . CHF (congestive heart failure) ([place])   . Chronic back pain   . Chronic kidney disease (CKD), stage III (moderate)   . History of nephrectomy   . Hx of CABG   . Hypercholesteremia   . Hypothyroidism   . Internal carotid artery stenosis   . Left bundle branch block   . Myocardial infarction ([place])   . Polymyalgia rheumatica ([place])   . Stroke [facility])     Past  Surgical History:  Procedure Laterality Date  . CHOLECYSTECTOMY    . CORONARY ARTERY BYPASS GRAFT    . KIDNEY SURGERY     kidney removed     reports that he quit smoking about 46 years ago. His smoking use included Cigarettes. He has never used smokeless tobacco. He reports that he does not drink alcohol or use drugs. Mobility: Bedbound, chronically unable to move legs, transfers with [facility] lift  Allergies  Allergen Reactions  . Penicillins Rash and Other (See Comments)    Has patient had a PCN reaction causing immediate rash, facial/tongue/throat swelling, SOB or lightheadedness with hypotension: Unknown Has patient had a PCN reaction causing severe rash involving mucus membranes or skin necrosis: Unknown Has patient had a PCN reaction that required hospitalization: Unknown Has patient had a PCN reaction occurring within the last 10 years: Unknown If all of the above answers are "NO", then may proceed with Cephalosporin use.     Family History  Problem Relation Age of Onset  . Other Mother        [place] Flu  . Heart disease Father      Prior to Admission medications   Medication Sig Start Date End Date Taking? Authorizing Provider  acetaminophen (TYLENOL) 500 MG tablet Take 1,000 mg by mouth 2 (two) times daily.    Yes [provider]  feeding supplement, ENSURE ENLIVE, (ENSURE ENLIVE) LIQD Take 237 mLs by mouth 2 (two) times daily between meals. 05/12/16  Yes Jani Gravel, MD  furosemide (LASIX) 20 MG tablet Take 0.5 tablets (10 mg total) by mouth daily. Resume on Saturday 01/31/2017. 01/31/17  Yes [place] Silos,  Malachy Moan, MD  gabapentin (NEURONTIN) 100 MG capsule Take 200 mg by mouth 3 (three) times daily.    Yes [provider]  methotrexate (RHEUMATREX) 2.5 MG tablet Take 10 mg by mouth every Wednesday.   Yes [provider]  predniSONE (DELTASONE) 5 MG tablet Take 5 mg by mouth daily.   Yes [provider]  SYNTHROID 100 MCG tablet Take 100 mcg  by mouth daily. 03/09/17  Yes [provider]  traMADol (ULTRAM) 50 MG tablet Take one tablet by mouth twice daily for pains PRN Patient taking differently: Take 50 mg by mouth every 8 (eight) hours as needed for moderate pain.  09/29/15  Yes Regalado, Belkys A, MD  ciprofloxacin (CIPRO) 500 MG tablet Take 1 tablet (500 mg total) by mouth every 12 (twelve) hours. Patient not taking: Reported on 05/27/2017 04/04/17   Virgel Manifold, MD    Physical Exam: Temperature 103.3, 108/54, 19, 78  Constitutional. Appears calm, comfortable.  Eyes. Pupils, irises, lids appear unremarkable.  ENT. Grossly normal hearing, lips, tongue. Face. Mask-like.  Neck. No lymphadenopathy or masses. No thyromegaly.  Cardiovascular. Regular rate and rhythm. No murmur, rub or gallop. No lower extremity edema.  Respiratory. Clear to auscultation bilaterally. No wheezes, rales or rhonchi. Normal respiratory effort.  Abdomen. Soft, nontender, nondistended. No hepatomegaly noted.  Skin. No rash or induration. Nontender to palpation.  GU. Penis appears unremarkable.  Musculoskeletal. Decreased tone bilateral lower extremities. No movement. Digits and nails of the upper extremities appear unremarkable except for chronic nicotine scanning.  Psychiatric. Alert, speech fluent and clear, delayed response. Cannot assess judgment or insight. Mood and affect appear appropriate to condition.  Wt Readings from Last 3 Encounters:  04/04/17 81.2 kg (179 lb)  01/28/17 81.6 kg (179 lb 14.3 oz)  05/10/16 80.8 kg (178 lb 2.1 oz)    I have personally reviewed following labs and imaging studies  Labs:   Complete metabolic panel unremarkable, random blood sugar 114  Lactic acid 2.6  CBC unremarkable    Imaging studies:   CXR independently reviewed: NAD, small left pleural effusion  Medical tests:   EKG independently reviewed sinus rhythm, indeterminate bundle branch block, anterior, inferior MI. Compared to  previous studies, under branch block is old, inferior MI old, anterior MI old.   Test discussed with performing physician:    Decision to obtain old records:     Review and summation of old records:   Urine culture 03/2017 as well as 01/2017: Greater than 100,000 colonies Enterococcus faecalis, resistant to Levaquin, sensitive to ampicillin, vancomycin.  Principal Problem:   Sepsis ([place]) Active Problems:   Chronic kidney disease (CKD), stage III (moderate)   Polymyalgia rheumatica ([facility])   Acute encephalopathy   Acute lower UTI   Assessment/Plan #1 sepsis secondary to presumed UTI,  with fever and tachypnea on admission. Previous urine culture showed Enterococcus faecalis. -Narrow antibiotics to cephalosporin (per pharmacy has tolerated cephalosporin in the past) and vancomycin pending culture data given previous culture data indicating resistance to Levaquin. -Has received 2 L however has not been hypotensive, lactic acid <4. -Trend lactic acid. -Urinalysis ordered but not yet obtained although urine cultures pending.  #2  Acute encephalopathy, secondary to above. -Empiric treatment as above. Follow clinically.  #3 Chronic kidney disease stage III, appears to be a baseline.  #4 Polymyalgia rheumatica. -Continue chronic prednisone. Should the patient become hypotensive, would initiate stress dose steroids.  #5 Wound left second toe present on admission -Wound care consult  #  5 Parkinson's disease -not on treatment  Severity of Illness: The appropriate patient status for this patient is OBSERVATION. Observation status is judged to be reasonable and necessary in order to provide the required intensity of service to ensure the patient's safety. The patient's presenting symptoms, physical exam findings, and initial radiographic and laboratory data in the context of their medical condition is felt to place them at decreased risk for further clinical deterioration. Furthermore, it  is anticipated that the patient will be medically stable for discharge from the hospital within 2 midnights of admission. The following factors support the patient status of observation.   DVT prophylaxis:enoxaparin Code Status: DNR Family Communication: wife, nurse, caretaker Consults called: none    Time spent: 68 minutes  Murray Hodgkins, MD  [name] Direct contact: [phone] --[place] [place]  --[email or web address]; password [email or web address]  7PM-7AM contact night coverage as above  05/27/2017, 9:36 AM

## 2017-05-27 NOTE — ED Notes (Signed)
Daughter POA wishes to make all decisions about his care

## 2017-05-27 NOTE — ED Notes (Signed)
Report given to floor rn 

## 2017-05-27 NOTE — Progress Notes (Signed)
Pharmacy Antibiotic Note  Barry Wright is a 83 y.o. male admitted on 05/27/2017 with altered mental status. .  Pharmacy has been consulted for vancomycin, aztreonam and levaquin dosing for presumed UTI given history of frequent UTI with most recent cultures growing enterococcus.  Hx CKD III, SCr 1.33 with CrCl 34 ml/min/1.69m2 (normalized) Weight 81kg in May 2018 Tmax 103.3 WBC 10.5 Lactate 2.6  Plan:  Vancomycin 1g IV q24h - check trough at steady state, goal 10-15 mcg/ml  Aztreonam 2g IV in ED, then 1g IV q8h  Levaquin 750mg  IV q48h  PCN allergy listed as rash but has tolerated ceftriaxone in the past - consider changing aztreonam/levaquin to cefepime to reduce risk of adverse effects with quinolones in the elderly    Temp (24hrs), Avg:100.8 F (38.2 C), Min:98.3 F (36.8 C), Max:103.3 F (39.6 C)   Recent Labs Lab 05/27/17 0628 05/27/17 0707  WBC 10.5  --   CREATININE 1.33*  --   LATICACIDVEN  --  2.60*    CrCl cannot be calculated (Unknown ideal weight.).    Allergies  Allergen Reactions  . Penicillins Rash and Other (See Comments)    Has patient had a PCN reaction causing immediate rash, facial/tongue/throat swelling, SOB or lightheadedness with hypotension: Unknown Has patient had a PCN reaction causing severe rash involving mucus membranes or skin necrosis: Unknown Has patient had a PCN reaction that required hospitalization: Unknown Has patient had a PCN reaction occurring within the last 10 years: Unknown If all of the above answers are "NO", then may proceed with Cephalosporin use.     Antimicrobials this admission:  7/18 Vanc >> 7/18 Aztreonam >> 7/18 Levaquin >>  Dose adjustments this admission:  ---  Microbiology results:  7/18 BCx: sent 7/18 UCx: sent  Previous cultures: 01/24/17 UCx: > 100k Enterococcus faecalis (S amp, nitro, vanc; R levo) 5/26 UCx: >100k Enterococcus faecalis (S amp, nitro, vanc; R levo) Thank you for allowing  pharmacy to be a part of this patient's care.  Peggyann Juba, PharmD, BCPS Pager: [phone] 05/27/2017 9:51 AM

## 2017-05-27 NOTE — ED Notes (Signed)
Daughter and POA- DebrahTorman [phone]

## 2017-05-27 NOTE — ED Notes (Signed)
Urine sample attempted patient had just went

## 2017-05-28 DIAGNOSIS — L89890 Pressure ulcer of other site, unstageable: Secondary | ICD-10-CM | POA: Diagnosis present

## 2017-05-28 DIAGNOSIS — Z66 Do not resuscitate: Secondary | ICD-10-CM | POA: Diagnosis present

## 2017-05-28 DIAGNOSIS — N183 Chronic kidney disease, stage 3 (moderate): Secondary | ICD-10-CM | POA: Diagnosis present

## 2017-05-28 DIAGNOSIS — G9340 Encephalopathy, unspecified: Secondary | ICD-10-CM | POA: Diagnosis present

## 2017-05-28 DIAGNOSIS — B952 Enterococcus as the cause of diseases classified elsewhere: Secondary | ICD-10-CM | POA: Diagnosis present

## 2017-05-28 DIAGNOSIS — Z7401 Bed confinement status: Secondary | ICD-10-CM | POA: Diagnosis not present

## 2017-05-28 DIAGNOSIS — R4182 Altered mental status, unspecified: Secondary | ICD-10-CM | POA: Diagnosis not present

## 2017-05-28 DIAGNOSIS — M549 Dorsalgia, unspecified: Secondary | ICD-10-CM | POA: Diagnosis present

## 2017-05-28 DIAGNOSIS — Z87891 Personal history of nicotine dependence: Secondary | ICD-10-CM | POA: Diagnosis not present

## 2017-05-28 DIAGNOSIS — Z7952 Long term (current) use of systemic steroids: Secondary | ICD-10-CM | POA: Diagnosis not present

## 2017-05-28 DIAGNOSIS — M353 Polymyalgia rheumatica: Secondary | ICD-10-CM | POA: Diagnosis present

## 2017-05-28 DIAGNOSIS — I130 Hypertensive heart and chronic kidney disease with heart failure and stage 1 through stage 4 chronic kidney disease, or unspecified chronic kidney disease: Secondary | ICD-10-CM | POA: Diagnosis present

## 2017-05-28 DIAGNOSIS — Z8673 Personal history of transient ischemic attack (TIA), and cerebral infarction without residual deficits: Secondary | ICD-10-CM | POA: Diagnosis not present

## 2017-05-28 DIAGNOSIS — I2510 Atherosclerotic heart disease of native coronary artery without angina pectoris: Secondary | ICD-10-CM | POA: Diagnosis present

## 2017-05-28 DIAGNOSIS — I252 Old myocardial infarction: Secondary | ICD-10-CM | POA: Diagnosis not present

## 2017-05-28 DIAGNOSIS — A419 Sepsis, unspecified organism: Secondary | ICD-10-CM | POA: Diagnosis not present

## 2017-05-28 DIAGNOSIS — I509 Heart failure, unspecified: Secondary | ICD-10-CM | POA: Diagnosis present

## 2017-05-28 DIAGNOSIS — G20 Parkinson's disease: Secondary | ICD-10-CM | POA: Diagnosis present

## 2017-05-28 DIAGNOSIS — E039 Hypothyroidism, unspecified: Secondary | ICD-10-CM | POA: Diagnosis present

## 2017-05-28 DIAGNOSIS — A4181 Sepsis due to Enterococcus: Secondary | ICD-10-CM | POA: Diagnosis present

## 2017-05-28 DIAGNOSIS — I6529 Occlusion and stenosis of unspecified carotid artery: Secondary | ICD-10-CM | POA: Diagnosis present

## 2017-05-28 DIAGNOSIS — G8929 Other chronic pain: Secondary | ICD-10-CM | POA: Diagnosis present

## 2017-05-28 DIAGNOSIS — I447 Left bundle-branch block, unspecified: Secondary | ICD-10-CM | POA: Diagnosis present

## 2017-05-28 DIAGNOSIS — L89620 Pressure ulcer of left heel, unstageable: Secondary | ICD-10-CM | POA: Diagnosis present

## 2017-05-28 DIAGNOSIS — N390 Urinary tract infection, site not specified: Secondary | ICD-10-CM | POA: Diagnosis present

## 2017-05-28 DIAGNOSIS — N289 Disorder of kidney and ureter, unspecified: Secondary | ICD-10-CM | POA: Diagnosis not present

## 2017-05-28 DIAGNOSIS — Z951 Presence of aortocoronary bypass graft: Secondary | ICD-10-CM | POA: Diagnosis not present

## 2017-05-28 LAB — CBC
HCT: 35.1 % — ABNORMAL LOW (ref 39.0–52.0)
HEMOGLOBIN: 11.7 g/dL — AB (ref 13.0–17.0)
MCH: 35.2 pg — ABNORMAL HIGH (ref 26.0–34.0)
MCHC: 33.3 g/dL (ref 30.0–36.0)
MCV: 105.7 fL — ABNORMAL HIGH (ref 78.0–100.0)
PLATELETS: 138 10*3/uL — AB (ref 150–400)
RBC: 3.32 MIL/uL — AB (ref 4.22–5.81)
RDW: 14 % (ref 11.5–15.5)
WBC: 9 10*3/uL (ref 4.0–10.5)

## 2017-05-28 LAB — BASIC METABOLIC PANEL
ANION GAP: 7 (ref 5–15)
BUN: 25 mg/dL — ABNORMAL HIGH (ref 6–20)
CALCIUM: 7.7 mg/dL — AB (ref 8.9–10.3)
CO2: 24 mmol/L (ref 22–32)
CREATININE: 1.29 mg/dL — AB (ref 0.61–1.24)
Chloride: 109 mmol/L (ref 101–111)
GFR calc non Af Amer: 46 mL/min — ABNORMAL LOW (ref >60)
GFR, EST AFRICAN AMERICAN: 53 mL/min — AB (ref >60)
Glucose, Bld: 136 mg/dL — ABNORMAL HIGH (ref 65–99)
Potassium: 4.2 mmol/L (ref 3.5–5.1)
SODIUM: 140 mmol/L (ref 135–145)

## 2017-05-28 NOTE — Care Management Note (Signed)
Case Management Note  Patient Details  Name: Barry Wright MRN: [id number] Date of Birth: January 31, 1920  Subjective/Objective: 83 y/o m admitted w/Sepsis. From home w/spouse-dementia,has private duty care-[employer].DNR,bedbound. Spoke to dtr-Deborah c#[phone]-she plans for patient to d/c back home w/HHC. PT cons-await recc.                   Action/Plan:d/c back home w/HHC.   Expected Discharge Date:   (unknown)               Expected Discharge Plan:  [place]  In-House Referral:  Clinical Social Work  Discharge planning Services  CM Consult  Post Acute Care Choice:  Durable Medical Equipment (hoyer lift,hospital bed) Choice offered to:     DME Arranged:    DME Agency:     HH Arranged:    HH Agency:     Status of Service:  In process, will continue to follow  If discussed at Long Length of Stay Meetings, dates discussed:    Additional Comments:  Dessa Phi, RN 05/28/2017, 2:20 PM

## 2017-05-28 NOTE — Care Management Obs Status (Signed)
[facility] NOTIFICATION   Patient Details  Name: NARADA UZZLE MRN: [id number] Date of Birth: April 26, 1920   Medicare Observation Status Notification Given:  Yes    MahabirJuliann Pulse, RN 05/28/2017, 2:19 PM

## 2017-05-28 NOTE — Consult Note (Signed)
[facility] Nurse wound consult note Reason for Consult: Nonhealing unstageable pressure injury to left lateral heel and left first and second metatarsal wounds.   Wound type: Chronic nonhealing, full thickness wounds Pressure Injury POA: Yes Measurement: Left great toe:  1 cm x 1 cm scabbed lesion to tip of toe Left second metatarsal: 0.5 cm scabbed lesion Left lateral heel:  1 cm x 1 cm scabbed lesion Wound RAX:ENMMHWK Drainage (amount, consistency, odor) minimal serosanguinous no odor Periwound: faint erythema Dressing procedure/placement/frequency: Cleanse wounds to first and second toes and left lateral heel with NS.  Apply Xeroform gauze to scabbed lesions.  Cover with 4x4 gauze and dry bandage.  Change daily.  Will not follow at this time.  Please re-consult if needed.  Domenic Moras RN BSN [place] Pager [phone]

## 2017-05-28 NOTE — Progress Notes (Signed)
PROGRESS NOTE    Barry Wright  QZR:[id number] DOB: [birth year] DOA: 05/27/2017 PCP: Lajean Manes, MD     Brief Narrative:  Barry Wright is a 83 year old man PMHx Parkinson's disease, coronary artery disease, polymyalgia rheumatica, frequent UTI who presented to the emergency department with sudden onset of altered mental status. He lives at home with wife, has caretakers at home. At baseline, he is bedbound and does not ambulate and requires assistance to transfer. He has no history of dementia and is conversive at baseline. On morning of admission, patient was found to be quiet, as well as confused. Workup in the emergency department revealed urinary tract infection.  Assessment & Plan:   Principal Problem:   Sepsis ([place]) Active Problems:   Chronic kidney disease (CKD), stage III (moderate)   Polymyalgia rheumatica (HCC)   Acute encephalopathy   Acute lower UTI  Acute encephalopathy secondary to urinary tract infection -CT head unremarkable for acute pathology -Resolved this morning and back to baseline   Sepsis secondary to urinary tract infection -Previous urine cultures have showed enterococcus faecalis -Continue empiric vancomycin, cefepime until urine culture results -Blood cultures pending -Stop IVF today   Chronic kidney disease stage III -Stable  Polymyalgia rheumatica -Continue chronic prednisone  Parkinson's disease -Does not take any medications  Hypothyroidism -Continue synthroid   Unstageable pressure injury to left lateral heel, left first and second metatarsal, POA -Wound nurse consulted -Dressing procedure/placement/frequency: Cleanse wounds to first and second toes and left lateral heel with NS.  Apply Xeroform gauze to scabbed lesions.  Cover with 4x4 gauze and dry bandage. Change daily.     DVT prophylaxis: lovenox Code Status: DNR Family Communication: Spoke with daughter, POA over phone this morning Disposition Plan: pending  improvement   Consultants:   None  Procedures:   None  Antimicrobials:  Anti-infectives    Start     Dose/Rate Route Frequency Ordered Stop   05/29/17 1000  levofloxacin (LEVAQUIN) IVPB 750 mg  Status:  Discontinued     750 mg 100 mL/hr over 90 Minutes Intravenous Every 48 hours 05/27/17 0950 05/27/17 1201   05/28/17 0000  vancomycin (VANCOCIN) IVPB 1000 mg/200 mL premix     1,000 mg 200 mL/hr over 60 Minutes Intravenous Every 24 hours 05/27/17 0949     05/27/17 1600  aztreonam (AZACTAM) 1 g in dextrose 5 % 50 mL IVPB  Status:  Discontinued     1 g 100 mL/hr over 30 Minutes Intravenous Every 8 hours 05/27/17 0951 05/27/17 1206   05/27/17 1600  ceFEPIme (MAXIPIME) 1 g in dextrose 5 % 50 mL IVPB     1 g 100 mL/hr over 30 Minutes Intravenous Every 24 hours 05/27/17 1219     05/27/17 0900  vancomycin (VANCOCIN) IVPB 1000 mg/200 mL premix     1,000 mg 200 mL/hr over 60 Minutes Intravenous  Once 05/27/17 0848 05/27/17 1026   05/27/17 0800  levofloxacin (LEVAQUIN) IVPB 750 mg     750 mg 100 mL/hr over 90 Minutes Intravenous  Once 05/27/17 0755 05/27/17 1026   05/27/17 0800  aztreonam (AZACTAM) 2 g in dextrose 5 % 50 mL IVPB     2 g 100 mL/hr over 30 Minutes Intravenous  Once 05/27/17 0755 05/27/17 0927        Subjective: Patient alert and awake. No complaints today. His mentation has resolved back to baseline, he is fully conversational this morning. He denies any pain, no chest pain or shortness of breath, no cough  or abdominal pain, no nausea or vomiting. Feeling well and wants to go home.   Objective: Vitals:   05/27/17 1521 05/27/17 1836 05/27/17 2243 05/28/17 0545  BP: (!) 93/48 (!) 111/44 (!) 120/54 139/72  Pulse: 76  64 78  Resp: 16  20 14   Temp: (!) 97.3 F (36.3 C)  98 F (36.7 C) 98.1 F (36.7 C)  TempSrc: Oral  Oral Oral  SpO2: 95%  93% 98%    Intake/Output Summary (Last 24 hours) at 05/28/17 1054 Last data filed at 05/28/17 0750  Gross per 24 hour   Intake             1720 ml  Output              675 ml  Net             1045 ml   There were no vitals filed for this visit.  Examination:  General exam: Appears calm and comfortable  Respiratory system: Clear to auscultation. Respiratory effort normal. Cardiovascular system: S1 & S2 heard. No JVD, murmurs, rubs, gallops or clicks. No pedal edema. Gastrointestinal system: Abdomen is nondistended, soft and nontender. No organomegaly or masses felt. Normal bowel sounds heard. Central nervous system: Alert and oriented. Nonfocal. Speech fluent.  Extremities: Symmetric Psychiatry: Judgement and insight appear normal. Mood & affect appropriate.   Data Reviewed: I have personally reviewed following labs and imaging studies  CBC:  Recent Labs Lab 05/27/17 0628 05/28/17 0346  WBC 10.5 9.0  NEUTROABS 6.7  --   HGB 13.9 11.7*  HCT 40.6 35.1*  MCV 106.0* 105.7*  PLT 153 161*   Basic Metabolic Panel:  Recent Labs Lab 05/27/17 0628 05/28/17 0346  NA 142 140  K 4.2 4.2  CL 103 109  CO2 27 24  GLUCOSE 114* 136*  BUN 25* 25*  CREATININE 1.33* 1.29*  CALCIUM 8.7* 7.7*   GFR: CrCl cannot be calculated (Unknown ideal weight.). Liver Function Tests:  Recent Labs Lab 05/27/17 0628  AST 21  ALT 16*  ALKPHOS 77  BILITOT 0.4  PROT 6.0*  ALBUMIN 2.9*   No results for input(s): LIPASE, AMYLASE in the last 168 hours. No results for input(s): AMMONIA in the last 168 hours. Coagulation Profile:  Recent Labs Lab 05/27/17 0628  INR 0.94   Cardiac Enzymes: No results for input(s): CKTOTAL, CKMB, CKMBINDEX, TROPONINI in the last 168 hours. BNP (last 3 results) No results for input(s): PROBNP in the last 8760 hours. HbA1C: No results for input(s): HGBA1C in the last 72 hours. CBG:  Recent Labs Lab 05/27/17 0632  GLUCAP 96   Lipid Profile: No results for input(s): CHOL, HDL, LDLCALC, TRIG, CHOLHDL, LDLDIRECT in the last 72 hours. Thyroid Function Tests: No results  for input(s): TSH, T4TOTAL, FREET4, T3FREE, THYROIDAB in the last 72 hours. Anemia Panel: No results for input(s): VITAMINB12, FOLATE, FERRITIN, TIBC, IRON, RETICCTPCT in the last 72 hours. Sepsis Labs:  Recent Labs Lab 05/27/17 0707 05/27/17 1104  LATICACIDVEN 2.60* 1.04    Recent Results (from the past 240 hour(s))  Blood Culture (routine x 2)     Status: None (Preliminary result)   Collection Time: 05/27/17  7:01 AM  Result Value Ref Range Status   Specimen Description BLOOD RIGHT WRIST  Final   Special Requests AEROBIC BOTTLE ONLY Blood Culture adequate volume  Final   Culture   Final    NO GROWTH < 24 HOURS Performed at [facility], [place]  [address], [place], [place] [address]    Report Status PENDING  Incomplete  Blood Culture (routine x 2)     Status: None (Preliminary result)   Collection Time: 05/27/17  7:02 AM  Result Value Ref Range Status   Specimen Description BLOOD RIGHT WRIST  Final   Special Requests IN PEDIATRIC BOTTLE Blood Culture adequate volume  Final   Culture   Final    NO GROWTH < 24 HOURS Performed at [facility], [place] [address], [place], [place] [address]    Report Status PENDING  Incomplete  Urine Culture     Status: None (Preliminary result)   Collection Time: 05/27/17  7:14 AM  Result Value Ref Range Status   Specimen Description URINE, CATHETERIZED  Final   Special Requests NONE  Final   Culture   Final    CULTURE REINCUBATED FOR BETTER GROWTH Performed at [facility], [address], [place], [place] [address]    Report Status PENDING  Incomplete       Radiology Studies: Ct Head Wo Contrast  Result Date: 05/27/2017 CLINICAL DATA:  Altered mental status. EXAM: CT HEAD WITHOUT CONTRAST TECHNIQUE: Contiguous axial images were obtained from the base of the skull through the vertex without intravenous contrast. COMPARISON:  05/10/2016 FINDINGS: Brain: No evidence of acute infarction, hemorrhage, hydrocephalus, extra-axial  collection or mass lesion/mass effect. Atrophy with an anterior cerebral predilection. Chronic microvascular ischemia with moderate low-density in the deep cerebral white matter. Vascular: Atherosclerotic calcification.  No hyperdense vessel. Skull: No acute or aggressive finding. Sinuses/Orbits: Secretions within the right maxillary and sphenoid sinuses. Bilateral cataract resection. IMPRESSION: 1. No acute intracranial finding. 2. Chronic small vessel ischemia and frontal predominant atrophy. 3. Secretions in the right maxillary and sphenoid sinuses. Electronically Signed   By: Monte Fantasia M.D.   On: 05/27/2017 08:25   Dg Chest Port 1 View  Result Date: 05/27/2017 CLINICAL DATA:  Altered mental status. EXAM: PORTABLE CHEST 1 VIEW COMPARISON:  05/10/2016 . FINDINGS: Prior CABG. Heart size stable. Interim near complete clearing of pulmonary interstitial edema. Small scratched left pleural effusion again noted. No pneumothorax. IMPRESSION: Prior CABG. Interim near complete clearing of pulmonary interstitial edema. Small left pleural effusion again noted. Electronically Signed   By: Marcello Moores  Register   On: 05/27/2017 07:20      Scheduled Meds: . acetaminophen  1,000 mg Oral BID  . enoxaparin (LOVENOX) injection  40 mg Subcutaneous Q24H  . feeding supplement (ENSURE ENLIVE)  237 mL Oral BID BM  . furosemide  10 mg Oral Daily  . gabapentin  200 mg Oral TID  . levothyroxine  100 mcg Oral QAC breakfast  . mouth rinse  15 mL Mouth Rinse BID  . predniSONE  5 mg Oral Daily   Continuous Infusions: . sodium chloride 75 mL/hr at 05/27/17 1316  . ceFEPime (MAXIPIME) IV Stopped (05/27/17 1729)  . vancomycin Stopped (05/28/17 0105)     LOS: 0 days    Time spent: 40 minutes   Dessa Phi, DO [name] [email or web address] Password [facility] 05/28/2017, 10:54 AM

## 2017-05-29 LAB — CBC WITH DIFFERENTIAL/PLATELET
BASOS ABS: 0 10*3/uL (ref 0.0–0.1)
BASOS PCT: 0 %
EOS PCT: 2 %
Eosinophils Absolute: 0.1 10*3/uL (ref 0.0–0.7)
HCT: 32.8 % — ABNORMAL LOW (ref 39.0–52.0)
Hemoglobin: 11 g/dL — ABNORMAL LOW (ref 13.0–17.0)
LYMPHS PCT: 33 %
Lymphs Abs: 2.4 10*3/uL (ref 0.7–4.0)
MCH: 35 pg — ABNORMAL HIGH (ref 26.0–34.0)
MCHC: 33.5 g/dL (ref 30.0–36.0)
MCV: 104.5 fL — AB (ref 78.0–100.0)
MONO ABS: 0.7 10*3/uL (ref 0.1–1.0)
Monocytes Relative: 10 %
NEUTROS ABS: 4 10*3/uL (ref 1.7–7.7)
Neutrophils Relative %: 55 %
PLATELETS: 149 10*3/uL — AB (ref 150–400)
RBC: 3.14 MIL/uL — AB (ref 4.22–5.81)
RDW: 14 % (ref 11.5–15.5)
WBC: 7.4 10*3/uL (ref 4.0–10.5)

## 2017-05-29 LAB — BASIC METABOLIC PANEL
ANION GAP: 5 (ref 5–15)
BUN: 25 mg/dL — ABNORMAL HIGH (ref 6–20)
CALCIUM: 7.9 mg/dL — AB (ref 8.9–10.3)
CO2: 26 mmol/L (ref 22–32)
Chloride: 109 mmol/L (ref 101–111)
Creatinine, Ser: 1.07 mg/dL (ref 0.61–1.24)
GFR, EST NON AFRICAN AMERICAN: 57 mL/min — AB (ref >60)
GLUCOSE: 105 mg/dL — AB (ref 65–99)
POTASSIUM: 3.7 mmol/L (ref 3.5–5.1)
Sodium: 140 mmol/L (ref 135–145)

## 2017-05-29 LAB — URINE CULTURE: Culture: 50000 — AB

## 2017-05-29 MED ORDER — NITROFURANTOIN MONOHYD MACRO 100 MG PO CAPS: 100.0000 mg | ORAL_CAPSULE | Freq: Two times a day (BID) | ORAL | 0 refills | Status: AC

## 2017-05-29 NOTE — Discharge Summary (Signed)
Physician Discharge Summary  Barry Wright MPN:[id number] DOB: [birth year] DOA: 05/27/2017  PCP: Lajean Manes, MD  Admit date: 05/27/2017 Discharge date: 05/29/2017  Admitted From: Home Disposition:  Home  Recommendations for Outpatient Follow-up:  1. Follow up with PCP in 1 week 2. Please follow up on the following pending results: final blood culture results, negative at time of discharge  Home Health: HHPT RN  Equipment/Devices: DME hospital bed    Discharge Condition: Stable CODE STATUS: DNR  Diet recommendation: Heart healthy   Brief/Interim Summary: Barry Wright is a 83 year old man PMHx Parkinson's disease, coronary artery disease, polymyalgia rheumatica, frequent UTI who presented to the emergency department with sudden onset of altered mental status. He lives at home with wife, has caretakers at home. At baseline, he is bedbound and does not ambulate and requires assistance to transfer. He has no history of dementia and is conversive at baseline. On morning of admission, patient was found to be quiet, as well as confused. Workup in the emergency department revealed urinary tract infection.  Acute encephalopathy secondary to urinary tract infection -CT head unremarkable for acute pathology -Resolved and back to baseline   Sepsis secondary to Enterococcus faecalis urinary tract infection, POA  -Previous urine cultures have showed enterococcus faecalis, sensitive to nitrofurantoin  -Blood cultures negative to date    Chronic kidney disease stage III -Stable  Polymyalgia rheumatica -Continue chronic prednisone  Parkinson's disease -Does not take any medications  Hypothyroidism -Continue synthroid   Unstageable pressure injury to left lateral heel, left first and second metatarsal, POA -Wound nurse consulted -Dressing procedure/placement/frequency: Cleanse wounds to first and second toes and left lateral heel with NS. Apply Xeroform gauze to scabbed lesions.  Cover with 4x4 gauze and dry bandage. Change daily.    Discharge Instructions  Discharge Instructions    Call MD for:  difficulty breathing, headache or visual disturbances    Complete by:  As directed    Call MD for:  extreme fatigue    Complete by:  As directed    Call MD for:  hives    Complete by:  As directed    Call MD for:  persistant dizziness or light-headedness    Complete by:  As directed    Call MD for:  persistant nausea and vomiting    Complete by:  As directed    Call MD for:  severe uncontrolled pain    Complete by:  As directed    Call MD for:  temperature >100.4    Complete by:  As directed    DME Hospital bed    Complete by:  As directed    The above medical condition requires:  Patient requires the ability to reposition frequently   Bed type:  Semi-electric   Hoyer Lift:  Yes   Diet - low sodium heart healthy    Complete by:  As directed    Discharge instructions    Complete by:  As directed    You were cared for by a hospitalist during your hospital stay. If you have any questions about your discharge medications or the care you received while you were in the hospital after you are discharged, you can call the unit and asked to speak with the hospitalist on call if the hospitalist that took care of you is not available. Once you are discharged, your primary care physician will handle any further medical issues. Please note that NO REFILLS for any discharge medications will be authorized once you are  discharged, as it is imperative that you return to your primary care physician (or establish a relationship with a primary care physician if you do not have one) for your aftercare needs so that they can reassess your need for medications and monitor your lab values.   Discharge wound care:    Complete by:  As directed    Cleanse wounds to first and second toes and left lateral heel with NS.  Apply Xeroform gauze to scabbed lesions.  Cover with 4x4 gauze and dry  bandage.  Change daily.   Face-to-face encounter (required for Medicare/Medicaid patients)    Complete by:  As directed    I Dessa Phi certify that this patient is under my care and that I, or a nurse practitioner or physician's assistant working with me, had a face-to-face encounter that meets the physician face-to-face encounter requirements with this patient on 05/29/2017. The encounter with the patient was in whole, or in part for the following medical condition(s) which is the primary reason for home health care (List medical condition): UTI, pressure ulcer, acute encephalopathy, chronic debility   The encounter with the patient was in whole, or in part, for the following medical condition, which is the primary reason for home health care:  UTI, pressure wounds   I certify that, based on my findings, the following services are medically necessary home health services:   Physical therapy Nursing     Reason for Medically Necessary Home Health Services:   Skilled Nursing- Change/Decline in Patient Status Skilled Nursing- Skilled Assessment/Observation Skilled Nursing- Assessment and Observation of Wound Status Therapy- Therapeutic Exercises to Increase Strength and Endurance     My clinical findings support the need for the above services:   Bedbound Unable to leave home safely without assistance and/or assistive device Can transfer bed to chair only     Further, I certify that my clinical findings support that this patient is homebound due to:  Can transfer bed to chair only   Home Health    Complete by:  As directed    To provide the following care/treatments:   PT [place]       Allergies as of 05/29/2017      Reactions   Penicillins Rash, Other (See Comments)   Has patient had a PCN reaction causing immediate rash, facial/tongue/throat swelling, SOB or lightheadedness with hypotension: Unknown Has patient had a PCN reaction causing severe rash involving mucus  membranes or skin necrosis: Unknown Has patient had a PCN reaction that required hospitalization: Unknown Has patient had a PCN reaction occurring within the last 10 years: Unknown If all of the above answers are "NO", then may proceed with Cephalosporin use.      Medication List    STOP taking these medications   ciprofloxacin 500 MG tablet Commonly known as:  CIPRO     TAKE these medications   acetaminophen 500 MG tablet Commonly known as:  TYLENOL Take 1,000 mg by mouth 2 (two) times daily.   feeding supplement (ENSURE ENLIVE) Liqd Take 237 mLs by mouth 2 (two) times daily between meals.   furosemide 20 MG tablet Commonly known as:  LASIX Take 0.5 tablets (10 mg total) by mouth daily. Resume on Saturday 01/31/2017.   gabapentin 100 MG capsule Commonly known as:  NEURONTIN Take 200 mg by mouth 3 (three) times daily.   methotrexate 2.5 MG tablet Commonly known as:  RHEUMATREX Take 10 mg by mouth every Wednesday.   nitrofurantoin (macrocrystal-monohydrate) 100  MG capsule Commonly known as:  MACROBID Take 1 capsule (100 mg total) by mouth 2 (two) times daily.   predniSONE 5 MG tablet Commonly known as:  DELTASONE Take 5 mg by mouth daily.   SYNTHROID 100 MCG tablet Generic drug:  levothyroxine Take 100 mcg by mouth daily.   traMADol 50 MG tablet Commonly known as:  ULTRAM Take one tablet by mouth twice daily for pains PRN What changed:  how much to take  how to take this  when to take this  reasons to take this  additional instructions            Durable Medical Equipment        Start     Ordered   05/29/17 [facility] bed    Question Answer Comment  The above medical condition requires: Patient requires the ability to reposition frequently   Bed type Semi-electric   Hoyer Lift Yes      05/29/17 0934     Follow-up Information    Stone[place], Christiane Ha, MD. Schedule an appointment as soon as possible for a visit in 1 week(s).   Specialty:   Internal Medicine Contact information: [address] [place] [address] [phone]          Allergies  Allergen Reactions  . Penicillins Rash and Other (See Comments)    Has patient had a PCN reaction causing immediate rash, facial/tongue/throat swelling, SOB or lightheadedness with hypotension: Unknown Has patient had a PCN reaction causing severe rash involving mucus membranes or skin necrosis: Unknown Has patient had a PCN reaction that required hospitalization: Unknown Has patient had a PCN reaction occurring within the last 10 years: Unknown If all of the above answers are "NO", then may proceed with Cephalosporin use.     Consultations:  None   Procedures/Studies: Ct Head Wo Contrast  Result Date: 05/27/2017 CLINICAL DATA:  Altered mental status. EXAM: CT HEAD WITHOUT CONTRAST TECHNIQUE: Contiguous axial images were obtained from the base of the skull through the vertex without intravenous contrast. COMPARISON:  05/10/2016 FINDINGS: Brain: No evidence of acute infarction, hemorrhage, hydrocephalus, extra-axial collection or mass lesion/mass effect. Atrophy with an anterior cerebral predilection. Chronic microvascular ischemia with moderate low-density in the deep cerebral white matter. Vascular: Atherosclerotic calcification.  No hyperdense vessel. Skull: No acute or aggressive finding. Sinuses/Orbits: Secretions within the right maxillary and sphenoid sinuses. Bilateral cataract resection. IMPRESSION: 1. No acute intracranial finding. 2. Chronic small vessel ischemia and frontal predominant atrophy. 3. Secretions in the right maxillary and sphenoid sinuses. Electronically Signed   By: Monte Fantasia M.D.   On: 05/27/2017 08:25   Dg Chest Port 1 View  Result Date: 05/27/2017 CLINICAL DATA:  Altered mental status. EXAM: PORTABLE CHEST 1 VIEW COMPARISON:  05/10/2016 . FINDINGS: Prior CABG. Heart size stable. Interim near complete clearing of pulmonary  interstitial edema. Small scratched left pleural effusion again noted. No pneumothorax. IMPRESSION: Prior CABG. Interim near complete clearing of pulmonary interstitial edema. Small left pleural effusion again noted. Electronically Signed   By: Marcello Moores  Register   On: 05/27/2017 07:20      Discharge Exam: Vitals:   05/28/17 2035 05/29/17 0440  BP: 138/67 (!) 141/61  Pulse: 66 (!) 58  Resp: 20 18  Temp: 97.7 F (36.5 C) 98.8 F (37.1 C)   Vitals:   05/28/17 1115 05/28/17 1412 05/28/17 2035 05/29/17 0440  BP:  116/63 138/67 (!) 141/61  Pulse:  76 66 (!) 58  Resp:  15 20 18   Temp:  98 F (36.7 C) 97.7 F (36.5 C) 98.8 F (37.1 C)  TempSrc:  Oral Axillary Oral  SpO2:  97% 97% 98%  Weight: 83.5 kg (184 lb 1.4 oz)       General: Pt is alert, awake, not in acute distress Cardiovascular: RRR, S1/S2 +, no rubs, no gallops Respiratory: CTA bilaterally, no wheezing, no rhonchi Abdominal: Soft, NT, ND, bowel sounds + Extremities: no edema, no cyanosis    The results of significant diagnostics from this hospitalization (including imaging, microbiology, ancillary and laboratory) are listed below for reference.     Microbiology: Recent Results (from the past 240 hour(s))  Blood Culture (routine x 2)     Status: None (Preliminary result)   Collection Time: 05/27/17  7:01 AM  Result Value Ref Range Status   Specimen Description BLOOD RIGHT WRIST  Final   Special Requests AEROBIC BOTTLE ONLY Blood Culture adequate volume  Final   Culture   Final    NO GROWTH 2 DAYS Performed at [facility], [address], [place], [place] [address]    Report Status PENDING  Incomplete  Blood Culture (routine x 2)     Status: None (Preliminary result)   Collection Time: 05/27/17  7:02 AM  Result Value Ref Range Status   Specimen Description BLOOD RIGHT WRIST  Final   Special Requests IN PEDIATRIC BOTTLE Blood Culture adequate volume  Final   Culture   Final    NO GROWTH 2 DAYS Performed  at [facility], [place] [address] [place], [place] [address]    Report Status PENDING  Incomplete  Urine Culture     Status: Abnormal   Collection Time: 05/27/17  7:14 AM  Result Value Ref Range Status   Specimen Description URINE, CATHETERIZED  Final   Special Requests NONE  Final   Culture 50,000 COLONIES/mL ENTEROCOCCUS FAECALIS (A)  Final   Report Status 05/29/2017 FINAL  Final   Organism ID, Bacteria ENTEROCOCCUS FAECALIS (A)  Final      Susceptibility   Enterococcus faecalis - MIC*    AMPICILLIN <=2 SENSITIVE Sensitive     LEVOFLOXACIN >=8 RESISTANT Resistant     NITROFURANTOIN <=16 SENSITIVE Sensitive     VANCOMYCIN 1 SENSITIVE Sensitive     * 50,000 COLONIES/mL ENTEROCOCCUS FAECALIS     Labs: BNP (last 3 results) No results for input(s): BNP in the last 8760 hours. Basic Metabolic Panel:  Recent Labs Lab 05/27/17 0628 05/28/17 0346 05/29/17 0359  NA 142 140 140  K 4.2 4.2 3.7  CL 103 109 109  CO2 27 24 26   GLUCOSE 114* 136* 105*  BUN 25* 25* 25*  CREATININE 1.33* 1.29* 1.07  CALCIUM 8.7* 7.7* 7.9*   Liver Function Tests:  Recent Labs Lab 05/27/17 0628  AST 21  ALT 16*  ALKPHOS 77  BILITOT 0.4  PROT 6.0*  ALBUMIN 2.9*   No results for input(s): LIPASE, AMYLASE in the last 168 hours. No results for input(s): AMMONIA in the last 168 hours. CBC:  Recent Labs Lab 05/27/17 0628 05/28/17 0346 05/29/17 0359  WBC 10.5 9.0 7.4  NEUTROABS 6.7  --  4.0  HGB 13.9 11.7* 11.0*  HCT 40.6 35.1* 32.8*  MCV 106.0* 105.7* 104.5*  PLT 153 138* 149*   Cardiac Enzymes: No results for input(s): CKTOTAL, CKMB, CKMBINDEX, TROPONINI in the last 168 hours. BNP: Invalid input(s): POCBNP CBG:  Recent Labs Lab 05/27/17 0632  GLUCAP 96  D-Dimer No results for input(s): DDIMER in the last 72 hours. Hgb A1c No results for input(s): HGBA1C in the last 72 hours. Lipid Profile No results for input(s): CHOL, HDL, LDLCALC, TRIG, CHOLHDL, LDLDIRECT in the last  72 hours. Thyroid function studies No results for input(s): TSH, T4TOTAL, T3FREE, THYROIDAB in the last 72 hours.  Invalid input(s): FREET3 Anemia work up No results for input(s): VITAMINB12, FOLATE, FERRITIN, TIBC, IRON, RETICCTPCT in the last 72 hours. Urinalysis    Component Value Date/Time   COLORURINE AMBER (A) 05/27/2017 1630   APPEARANCEUR TURBID (A) 05/27/2017 1630   LABSPEC 1.015 05/27/2017 1630   PHURINE 5.0 05/27/2017 1630   GLUCOSEU NEGATIVE 05/27/2017 1630   HGBUR MODERATE (A) 05/27/2017 1630   BILIRUBINUR NEGATIVE 05/27/2017 1630   KETONESUR NEGATIVE 05/27/2017 1630   PROTEINUR 30 (A) 05/27/2017 1630   UROBILINOGEN 0.2 04/11/2015 1437   NITRITE NEGATIVE 05/27/2017 1630   LEUKOCYTESUR LARGE (A) 05/27/2017 1630   Sepsis Labs Invalid input(s): PROCALCITONIN,  WBC,  LACTICIDVEN Microbiology Recent Results (from the past 240 hour(s))  Blood Culture (routine x 2)     Status: None (Preliminary result)   Collection Time: 05/27/17  7:01 AM  Result Value Ref Range Status   Specimen Description BLOOD RIGHT WRIST  Final   Special Requests AEROBIC BOTTLE ONLY Blood Culture adequate volume  Final   Culture   Final    NO GROWTH 2 DAYS Performed at [name], [place] [address] [place], [place] [address]    Report Status PENDING  Incomplete  Blood Culture (routine x 2)     Status: None (Preliminary result)   Collection Time: 05/27/17  7:02 AM  Result Value Ref Range Status   Specimen Description BLOOD RIGHT WRIST  Final   Special Requests IN PEDIATRIC BOTTLE Blood Culture adequate volume  Final   Culture   Final    NO GROWTH 2 DAYS Performed at [facility], [place] [address], [place], [place] [address]    Report Status PENDING  Incomplete  Urine Culture     Status: Abnormal   Collection Time: 05/27/17  7:14 AM  Result Value Ref Range Status   Specimen Description URINE, CATHETERIZED  Final   Special Requests NONE  Final   Culture 50,000 COLONIES/mL  ENTEROCOCCUS FAECALIS (A)  Final   Report Status 05/29/2017 FINAL  Final   Organism ID, Bacteria ENTEROCOCCUS FAECALIS (A)  Final      Susceptibility   Enterococcus faecalis - MIC*    AMPICILLIN <=2 SENSITIVE Sensitive     LEVOFLOXACIN >=8 RESISTANT Resistant     NITROFURANTOIN <=16 SENSITIVE Sensitive     VANCOMYCIN 1 SENSITIVE Sensitive     * 50,000 COLONIES/mL ENTEROCOCCUS FAECALIS     Time coordinating discharge: 40 minutes  SIGNED:  Dessa Phi, DO [name] Pager [phone]  If 7PM-7AM, please contact night-coverage [email or web address] Password [email or web address] 05/29/2017, 1:26 PM

## 2017-05-29 NOTE — Progress Notes (Signed)
Discharge instructions given to care giver,Sarah, verbalized understanding. All questions were answered appropriately. I spoke with Deborah,daughter and informed re: patient d/c back home. Awaiting for PTAR.

## 2017-05-29 NOTE — Progress Notes (Signed)
Patient d/c home via [facility]. Stable.

## 2017-05-29 NOTE — Progress Notes (Signed)
Spoke with pt'd daughter Neoma Laming via cell concerning discharge need and DME. [facility] was selected, referral given to in house rep. Daughter asked for PTAR to be called related to pt not being able to transfer by wheel chair to car.

## 2017-05-29 NOTE — Evaluation (Signed)
Physical Therapy One Time Evaluation Patient Details Name: Barry Wright MRN: [id number] DOB: 1920-10-17 Today's Date: 05/29/2017   History of Present Illness  83 year old man PMHx Parkinson's disease, coronary artery disease, polymyalgia rheumatica, frequent UTI who presented to the emergency department with sudden onset of altered mental status and admitted for sepsis and acute encephalopathy due to UTI  Clinical Impression  Patient evaluated by Physical Therapy with no further acute PT needs identified. All education has been completed and the patient has no further questions.  Pt from home with caregivers.  Caregiver reports most recently pt is assisted OOB with hoyer lift however was transferring and taking a few steps with HHPT about a month prior to admission.  Pt may benefit from HHPT upon d/c.  Discussed positional changes every 2 hours when in bed and floating heels to prevent further skin breakdown with pt, spouse, and caregiver present. See below for any follow-up Physical Therapy or equipment needs. PT is signing off. Thank you for this referral.     Follow Up Recommendations Home health PT;Supervision/Assistance - 24 hour    Equipment Recommendations  Other (comment) (may need w/c for MD appts? HH can assess)    Recommendations for Other Services       Precautions / Restrictions Precautions Precautions: Fall      Mobility  Bed Mobility Overal bed mobility: Needs Assistance Bed Mobility: Supine to Sit;Sit to Supine     Supine to sit: Max assist;HOB elevated Sit to supine: Max assist   General bed mobility comments: verbal cues to for technique, assist for lower body and upper body, pt attempting to assist however very weak  Transfers                 General transfer comment: deferred for safety  Ambulation/Gait                Stairs            Wheelchair Mobility    Modified Rankin (Stroke Patients Only)       Balance Overall  balance assessment: Needs assistance Sitting-balance support: Bilateral upper extremity supported;Feet supported Sitting balance-Leahy Scale: Zero Sitting balance - Comments: requires significant external assist for trunk support, able to hold briefly without assist however fatigues quickly                                     Pertinent Vitals/Pain Pain Assessment: No/denies pain    Home Living Family/patient expects to be discharged to:: Private residence Living Arrangements: Spouse/significant other Available Help at Discharge: Personal care attendant Type of Home: House Home Access: Ramped entrance     Home Layout: Able to live on main level with bedroom/bathroom Home Equipment: Walker - 2 wheels;Bedside commode;Shower seat;Grab bars - toilet;Grab bars - tub/shower;Wheelchair - manual;Hospital bed (hoyer lift)      Prior Function Level of Independence: Needs assistance   Gait / Transfers Assistance Needed: non ambulatory, assist to pivot to WC using sliding board or RW, most recently reported using hoyer lift for OOB; last HHPT approx one month ago  ADL's / Homemaking Assistance Needed: assist with sponge bath, dependent for housekeeping, meal prep        Hand Dominance        Extremity/Trunk Assessment   Upper Extremity Assessment Upper Extremity Assessment: Generalized weakness    Lower Extremity Assessment Lower Extremity Assessment: Generalized weakness  Communication   Communication: HOH  Cognition Arousal/Alertness: Awake/alert Behavior During Therapy: WFL for tasks assessed/performed Overall Cognitive Status: History of cognitive impairments - at baseline                                        General Comments      Exercises     Assessment/Plan    PT Assessment All further PT needs can be met in the next venue of care  PT Problem List Decreased strength;Decreased mobility;Decreased activity tolerance;Decreased  balance;Decreased knowledge of use of DME       PT Treatment Interventions      PT Goals (Current goals can be found in the Care Plan section)  Acute Rehab PT Goals PT Goal Formulation: All assessment and education complete, DC therapy    Frequency     Barriers to discharge        Co-evaluation               AM-PAC PT "6 Clicks" Daily Activity  Outcome Measure Difficulty turning over in bed (including adjusting bedclothes, sheets and blankets)?: Total Difficulty moving from lying on back to sitting on the side of the bed? : Total Difficulty sitting down on and standing up from a chair with arms (e.g., wheelchair, bedside commode, etc,.)?: Total Help needed moving to and from a bed to chair (including a wheelchair)?: Total Help needed walking in hospital room?: Total Help needed climbing 3-5 steps with a railing? : Total 6 Click Score: 6    End of Session   Activity Tolerance: Patient limited by fatigue Patient left: in bed;with call bell/phone within reach;with family/visitor present Nurse Communication: Mobility status PT Visit Diagnosis: Other abnormalities of gait and mobility (R26.89);Muscle weakness (generalized) (M62.81)    Time: 1655-3748 PT Time Calculation (min) (ACUTE ONLY): 18 min   Charges:   PT Evaluation $PT Eval Moderate Complexity: 1 Procedure     PT G Codes:        Carmelia Bake, PT, DPT 05/29/2017 Pager: [phone]   York Ram E 05/29/2017, 12:56 PM

## 2017-06-01 LAB — CULTURE, BLOOD (ROUTINE X 2)
CULTURE: NO GROWTH
Culture: NO GROWTH
Special Requests: ADEQUATE
Special Requests: ADEQUATE

## 2017-06-02 DIAGNOSIS — I509 Heart failure, unspecified: Secondary | ICD-10-CM | POA: Diagnosis not present

## 2017-06-02 DIAGNOSIS — L89322 Pressure ulcer of left buttock, stage 2: Secondary | ICD-10-CM | POA: Diagnosis not present

## 2017-06-02 DIAGNOSIS — N183 Chronic kidney disease, stage 3 (moderate): Secondary | ICD-10-CM | POA: Diagnosis not present

## 2017-06-02 DIAGNOSIS — E785 Hyperlipidemia, unspecified: Secondary | ICD-10-CM | POA: Diagnosis not present

## 2017-06-02 DIAGNOSIS — Z7952 Long term (current) use of systemic steroids: Secondary | ICD-10-CM | POA: Diagnosis not present

## 2017-06-02 DIAGNOSIS — Z87891 Personal history of nicotine dependence: Secondary | ICD-10-CM | POA: Diagnosis not present

## 2017-06-02 DIAGNOSIS — Z940 Kidney transplant status: Secondary | ICD-10-CM | POA: Diagnosis not present

## 2017-06-02 DIAGNOSIS — Z87440 Personal history of urinary (tract) infections: Secondary | ICD-10-CM | POA: Diagnosis not present

## 2017-06-02 DIAGNOSIS — M353 Polymyalgia rheumatica: Secondary | ICD-10-CM | POA: Diagnosis not present

## 2017-06-02 DIAGNOSIS — S91105D Unspecified open wound of left lesser toe(s) without damage to nail, subsequent encounter: Secondary | ICD-10-CM | POA: Diagnosis not present

## 2017-06-02 DIAGNOSIS — G20 Parkinson's disease: Secondary | ICD-10-CM | POA: Diagnosis not present

## 2017-06-02 DIAGNOSIS — I2510 Atherosclerotic heart disease of native coronary artery without angina pectoris: Secondary | ICD-10-CM | POA: Diagnosis not present

## 2017-06-02 DIAGNOSIS — E039 Hypothyroidism, unspecified: Secondary | ICD-10-CM | POA: Diagnosis not present

## 2017-06-02 DIAGNOSIS — Z951 Presence of aortocoronary bypass graft: Secondary | ICD-10-CM | POA: Diagnosis not present

## 2017-06-03 DIAGNOSIS — I509 Heart failure, unspecified: Secondary | ICD-10-CM | POA: Diagnosis not present

## 2017-06-03 DIAGNOSIS — G20 Parkinson's disease: Secondary | ICD-10-CM | POA: Diagnosis not present

## 2017-06-03 DIAGNOSIS — L89322 Pressure ulcer of left buttock, stage 2: Secondary | ICD-10-CM | POA: Diagnosis not present

## 2017-06-03 DIAGNOSIS — M353 Polymyalgia rheumatica: Secondary | ICD-10-CM | POA: Diagnosis not present

## 2017-06-03 DIAGNOSIS — I2510 Atherosclerotic heart disease of native coronary artery without angina pectoris: Secondary | ICD-10-CM | POA: Diagnosis not present

## 2017-06-03 DIAGNOSIS — N183 Chronic kidney disease, stage 3 (moderate): Secondary | ICD-10-CM | POA: Diagnosis not present

## 2017-06-04 DIAGNOSIS — N183 Chronic kidney disease, stage 3 (moderate): Secondary | ICD-10-CM | POA: Diagnosis not present

## 2017-06-04 DIAGNOSIS — I2510 Atherosclerotic heart disease of native coronary artery without angina pectoris: Secondary | ICD-10-CM | POA: Diagnosis not present

## 2017-06-04 DIAGNOSIS — I509 Heart failure, unspecified: Secondary | ICD-10-CM | POA: Diagnosis not present

## 2017-06-04 DIAGNOSIS — G20 Parkinson's disease: Secondary | ICD-10-CM | POA: Diagnosis not present

## 2017-06-04 DIAGNOSIS — L89322 Pressure ulcer of left buttock, stage 2: Secondary | ICD-10-CM | POA: Diagnosis not present

## 2017-06-04 DIAGNOSIS — M353 Polymyalgia rheumatica: Secondary | ICD-10-CM | POA: Diagnosis not present

## 2017-06-08 DIAGNOSIS — L89322 Pressure ulcer of left buttock, stage 2: Secondary | ICD-10-CM | POA: Diagnosis not present

## 2017-06-08 DIAGNOSIS — M353 Polymyalgia rheumatica: Secondary | ICD-10-CM | POA: Diagnosis not present

## 2017-06-08 DIAGNOSIS — N183 Chronic kidney disease, stage 3 (moderate): Secondary | ICD-10-CM | POA: Diagnosis not present

## 2017-06-08 DIAGNOSIS — I2510 Atherosclerotic heart disease of native coronary artery without angina pectoris: Secondary | ICD-10-CM | POA: Diagnosis not present

## 2017-06-08 DIAGNOSIS — G20 Parkinson's disease: Secondary | ICD-10-CM | POA: Diagnosis not present

## 2017-06-08 DIAGNOSIS — I509 Heart failure, unspecified: Secondary | ICD-10-CM | POA: Diagnosis not present

## 2017-06-10 DIAGNOSIS — G20 Parkinson's disease: Secondary | ICD-10-CM | POA: Diagnosis not present

## 2017-06-10 DIAGNOSIS — I509 Heart failure, unspecified: Secondary | ICD-10-CM | POA: Diagnosis not present

## 2017-06-10 DIAGNOSIS — I2510 Atherosclerotic heart disease of native coronary artery without angina pectoris: Secondary | ICD-10-CM | POA: Diagnosis not present

## 2017-06-10 DIAGNOSIS — N183 Chronic kidney disease, stage 3 (moderate): Secondary | ICD-10-CM | POA: Diagnosis not present

## 2017-06-10 DIAGNOSIS — M353 Polymyalgia rheumatica: Secondary | ICD-10-CM | POA: Diagnosis not present

## 2017-06-10 DIAGNOSIS — L89322 Pressure ulcer of left buttock, stage 2: Secondary | ICD-10-CM | POA: Diagnosis not present

## 2017-06-12 DIAGNOSIS — L89322 Pressure ulcer of left buttock, stage 2: Secondary | ICD-10-CM | POA: Diagnosis not present

## 2017-06-12 DIAGNOSIS — G20 Parkinson's disease: Secondary | ICD-10-CM | POA: Diagnosis not present

## 2017-06-12 DIAGNOSIS — M353 Polymyalgia rheumatica: Secondary | ICD-10-CM | POA: Diagnosis not present

## 2017-06-12 DIAGNOSIS — I2510 Atherosclerotic heart disease of native coronary artery without angina pectoris: Secondary | ICD-10-CM | POA: Diagnosis not present

## 2017-06-12 DIAGNOSIS — I509 Heart failure, unspecified: Secondary | ICD-10-CM | POA: Diagnosis not present

## 2017-06-12 DIAGNOSIS — N183 Chronic kidney disease, stage 3 (moderate): Secondary | ICD-10-CM | POA: Diagnosis not present

## 2017-06-15 DIAGNOSIS — I2510 Atherosclerotic heart disease of native coronary artery without angina pectoris: Secondary | ICD-10-CM | POA: Diagnosis not present

## 2017-06-15 DIAGNOSIS — N183 Chronic kidney disease, stage 3 (moderate): Secondary | ICD-10-CM | POA: Diagnosis not present

## 2017-06-15 DIAGNOSIS — L89322 Pressure ulcer of left buttock, stage 2: Secondary | ICD-10-CM | POA: Diagnosis not present

## 2017-06-15 DIAGNOSIS — M353 Polymyalgia rheumatica: Secondary | ICD-10-CM | POA: Diagnosis not present

## 2017-06-15 DIAGNOSIS — G20 Parkinson's disease: Secondary | ICD-10-CM | POA: Diagnosis not present

## 2017-06-15 DIAGNOSIS — I509 Heart failure, unspecified: Secondary | ICD-10-CM | POA: Diagnosis not present

## 2017-06-17 DIAGNOSIS — I2510 Atherosclerotic heart disease of native coronary artery without angina pectoris: Secondary | ICD-10-CM | POA: Diagnosis not present

## 2017-06-17 DIAGNOSIS — I509 Heart failure, unspecified: Secondary | ICD-10-CM | POA: Diagnosis not present

## 2017-06-17 DIAGNOSIS — N183 Chronic kidney disease, stage 3 (moderate): Secondary | ICD-10-CM | POA: Diagnosis not present

## 2017-06-17 DIAGNOSIS — G20 Parkinson's disease: Secondary | ICD-10-CM | POA: Diagnosis not present

## 2017-06-17 DIAGNOSIS — L89322 Pressure ulcer of left buttock, stage 2: Secondary | ICD-10-CM | POA: Diagnosis not present

## 2017-06-17 DIAGNOSIS — M353 Polymyalgia rheumatica: Secondary | ICD-10-CM | POA: Diagnosis not present

## 2017-06-18 DIAGNOSIS — R7611 Nonspecific reaction to tuberculin skin test without active tuberculosis: Secondary | ICD-10-CM | POA: Diagnosis not present

## 2017-06-19 DIAGNOSIS — N183 Chronic kidney disease, stage 3 (moderate): Secondary | ICD-10-CM | POA: Diagnosis not present

## 2017-06-19 DIAGNOSIS — G20 Parkinson's disease: Secondary | ICD-10-CM | POA: Diagnosis not present

## 2017-06-19 DIAGNOSIS — I509 Heart failure, unspecified: Secondary | ICD-10-CM | POA: Diagnosis not present

## 2017-06-19 DIAGNOSIS — M353 Polymyalgia rheumatica: Secondary | ICD-10-CM | POA: Diagnosis not present

## 2017-06-19 DIAGNOSIS — L89322 Pressure ulcer of left buttock, stage 2: Secondary | ICD-10-CM | POA: Diagnosis not present

## 2017-06-19 DIAGNOSIS — I2510 Atherosclerotic heart disease of native coronary artery without angina pectoris: Secondary | ICD-10-CM | POA: Diagnosis not present

## 2017-06-22 DIAGNOSIS — L89322 Pressure ulcer of left buttock, stage 2: Secondary | ICD-10-CM | POA: Diagnosis not present

## 2017-06-22 DIAGNOSIS — I509 Heart failure, unspecified: Secondary | ICD-10-CM | POA: Diagnosis not present

## 2017-06-22 DIAGNOSIS — I2510 Atherosclerotic heart disease of native coronary artery without angina pectoris: Secondary | ICD-10-CM | POA: Diagnosis not present

## 2017-06-22 DIAGNOSIS — M353 Polymyalgia rheumatica: Secondary | ICD-10-CM | POA: Diagnosis not present

## 2017-06-22 DIAGNOSIS — N183 Chronic kidney disease, stage 3 (moderate): Secondary | ICD-10-CM | POA: Diagnosis not present

## 2017-06-22 DIAGNOSIS — G20 Parkinson's disease: Secondary | ICD-10-CM | POA: Diagnosis not present

## 2017-06-23 DIAGNOSIS — G20 Parkinson's disease: Secondary | ICD-10-CM | POA: Diagnosis not present

## 2017-06-23 DIAGNOSIS — I2510 Atherosclerotic heart disease of native coronary artery without angina pectoris: Secondary | ICD-10-CM | POA: Diagnosis not present

## 2017-06-23 DIAGNOSIS — N183 Chronic kidney disease, stage 3 (moderate): Secondary | ICD-10-CM | POA: Diagnosis not present

## 2017-06-23 DIAGNOSIS — M353 Polymyalgia rheumatica: Secondary | ICD-10-CM | POA: Diagnosis not present

## 2017-06-23 DIAGNOSIS — L89322 Pressure ulcer of left buttock, stage 2: Secondary | ICD-10-CM | POA: Diagnosis not present

## 2017-06-23 DIAGNOSIS — I509 Heart failure, unspecified: Secondary | ICD-10-CM | POA: Diagnosis not present

## 2017-06-26 DIAGNOSIS — L89322 Pressure ulcer of left buttock, stage 2: Secondary | ICD-10-CM | POA: Diagnosis not present

## 2017-06-26 DIAGNOSIS — N183 Chronic kidney disease, stage 3 (moderate): Secondary | ICD-10-CM | POA: Diagnosis not present

## 2017-06-26 DIAGNOSIS — M353 Polymyalgia rheumatica: Secondary | ICD-10-CM | POA: Diagnosis not present

## 2017-06-26 DIAGNOSIS — G20 Parkinson's disease: Secondary | ICD-10-CM | POA: Diagnosis not present

## 2017-06-26 DIAGNOSIS — I509 Heart failure, unspecified: Secondary | ICD-10-CM | POA: Diagnosis not present

## 2017-06-26 DIAGNOSIS — I2510 Atherosclerotic heart disease of native coronary artery without angina pectoris: Secondary | ICD-10-CM | POA: Diagnosis not present

## 2017-07-01 DIAGNOSIS — L89322 Pressure ulcer of left buttock, stage 2: Secondary | ICD-10-CM | POA: Diagnosis not present

## 2017-07-01 DIAGNOSIS — I509 Heart failure, unspecified: Secondary | ICD-10-CM | POA: Diagnosis not present

## 2017-07-01 DIAGNOSIS — N183 Chronic kidney disease, stage 3 (moderate): Secondary | ICD-10-CM | POA: Diagnosis not present

## 2017-07-01 DIAGNOSIS — G20 Parkinson's disease: Secondary | ICD-10-CM | POA: Diagnosis not present

## 2017-07-01 DIAGNOSIS — M353 Polymyalgia rheumatica: Secondary | ICD-10-CM | POA: Diagnosis not present

## 2017-07-01 DIAGNOSIS — I2510 Atherosclerotic heart disease of native coronary artery without angina pectoris: Secondary | ICD-10-CM | POA: Diagnosis not present

## 2017-07-02 DIAGNOSIS — G20 Parkinson's disease: Secondary | ICD-10-CM | POA: Diagnosis not present

## 2017-07-02 DIAGNOSIS — L89322 Pressure ulcer of left buttock, stage 2: Secondary | ICD-10-CM | POA: Diagnosis not present

## 2017-07-02 DIAGNOSIS — I509 Heart failure, unspecified: Secondary | ICD-10-CM | POA: Diagnosis not present

## 2017-07-02 DIAGNOSIS — N183 Chronic kidney disease, stage 3 (moderate): Secondary | ICD-10-CM | POA: Diagnosis not present

## 2017-07-02 DIAGNOSIS — I2510 Atherosclerotic heart disease of native coronary artery without angina pectoris: Secondary | ICD-10-CM | POA: Diagnosis not present

## 2017-07-02 DIAGNOSIS — M353 Polymyalgia rheumatica: Secondary | ICD-10-CM | POA: Diagnosis not present

## 2017-07-06 DIAGNOSIS — L89322 Pressure ulcer of left buttock, stage 2: Secondary | ICD-10-CM | POA: Diagnosis not present

## 2017-07-06 DIAGNOSIS — G20 Parkinson's disease: Secondary | ICD-10-CM | POA: Diagnosis not present

## 2017-07-06 DIAGNOSIS — M353 Polymyalgia rheumatica: Secondary | ICD-10-CM | POA: Diagnosis not present

## 2017-07-06 DIAGNOSIS — I2510 Atherosclerotic heart disease of native coronary artery without angina pectoris: Secondary | ICD-10-CM | POA: Diagnosis not present

## 2017-07-06 DIAGNOSIS — I509 Heart failure, unspecified: Secondary | ICD-10-CM | POA: Diagnosis not present

## 2017-07-06 DIAGNOSIS — N183 Chronic kidney disease, stage 3 (moderate): Secondary | ICD-10-CM | POA: Diagnosis not present

## 2017-07-07 DIAGNOSIS — G20 Parkinson's disease: Secondary | ICD-10-CM | POA: Diagnosis not present

## 2017-07-07 DIAGNOSIS — L89322 Pressure ulcer of left buttock, stage 2: Secondary | ICD-10-CM | POA: Diagnosis not present

## 2017-07-07 DIAGNOSIS — I509 Heart failure, unspecified: Secondary | ICD-10-CM | POA: Diagnosis not present

## 2017-07-07 DIAGNOSIS — I2510 Atherosclerotic heart disease of native coronary artery without angina pectoris: Secondary | ICD-10-CM | POA: Diagnosis not present

## 2017-07-07 DIAGNOSIS — N183 Chronic kidney disease, stage 3 (moderate): Secondary | ICD-10-CM | POA: Diagnosis not present

## 2017-07-07 DIAGNOSIS — M353 Polymyalgia rheumatica: Secondary | ICD-10-CM | POA: Diagnosis not present

## 2017-07-09 DIAGNOSIS — I509 Heart failure, unspecified: Secondary | ICD-10-CM | POA: Diagnosis not present

## 2017-07-09 DIAGNOSIS — G20 Parkinson's disease: Secondary | ICD-10-CM | POA: Diagnosis not present

## 2017-07-09 DIAGNOSIS — I2510 Atherosclerotic heart disease of native coronary artery without angina pectoris: Secondary | ICD-10-CM | POA: Diagnosis not present

## 2017-07-09 DIAGNOSIS — M353 Polymyalgia rheumatica: Secondary | ICD-10-CM | POA: Diagnosis not present

## 2017-07-09 DIAGNOSIS — L89322 Pressure ulcer of left buttock, stage 2: Secondary | ICD-10-CM | POA: Diagnosis not present

## 2017-07-09 DIAGNOSIS — N183 Chronic kidney disease, stage 3 (moderate): Secondary | ICD-10-CM | POA: Diagnosis not present

## 2017-07-10 DIAGNOSIS — I2510 Atherosclerotic heart disease of native coronary artery without angina pectoris: Secondary | ICD-10-CM | POA: Diagnosis not present

## 2017-07-10 DIAGNOSIS — L89322 Pressure ulcer of left buttock, stage 2: Secondary | ICD-10-CM | POA: Diagnosis not present

## 2017-07-10 DIAGNOSIS — I509 Heart failure, unspecified: Secondary | ICD-10-CM | POA: Diagnosis not present

## 2017-07-10 DIAGNOSIS — G20 Parkinson's disease: Secondary | ICD-10-CM | POA: Diagnosis not present

## 2017-07-10 DIAGNOSIS — N183 Chronic kidney disease, stage 3 (moderate): Secondary | ICD-10-CM | POA: Diagnosis not present

## 2017-07-10 DIAGNOSIS — M353 Polymyalgia rheumatica: Secondary | ICD-10-CM | POA: Diagnosis not present

## 2017-07-13 DIAGNOSIS — I509 Heart failure, unspecified: Secondary | ICD-10-CM | POA: Diagnosis not present

## 2017-07-13 DIAGNOSIS — L89322 Pressure ulcer of left buttock, stage 2: Secondary | ICD-10-CM | POA: Diagnosis not present

## 2017-07-13 DIAGNOSIS — N183 Chronic kidney disease, stage 3 (moderate): Secondary | ICD-10-CM | POA: Diagnosis not present

## 2017-07-13 DIAGNOSIS — M353 Polymyalgia rheumatica: Secondary | ICD-10-CM | POA: Diagnosis not present

## 2017-07-13 DIAGNOSIS — I2510 Atherosclerotic heart disease of native coronary artery without angina pectoris: Secondary | ICD-10-CM | POA: Diagnosis not present

## 2017-07-13 DIAGNOSIS — G20 Parkinson's disease: Secondary | ICD-10-CM | POA: Diagnosis not present

## 2017-07-14 DIAGNOSIS — G20 Parkinson's disease: Secondary | ICD-10-CM | POA: Diagnosis not present

## 2017-07-14 DIAGNOSIS — M353 Polymyalgia rheumatica: Secondary | ICD-10-CM | POA: Diagnosis not present

## 2017-07-14 DIAGNOSIS — I2510 Atherosclerotic heart disease of native coronary artery without angina pectoris: Secondary | ICD-10-CM | POA: Diagnosis not present

## 2017-07-14 DIAGNOSIS — L89322 Pressure ulcer of left buttock, stage 2: Secondary | ICD-10-CM | POA: Diagnosis not present

## 2017-07-14 DIAGNOSIS — I509 Heart failure, unspecified: Secondary | ICD-10-CM | POA: Diagnosis not present

## 2017-07-14 DIAGNOSIS — N183 Chronic kidney disease, stage 3 (moderate): Secondary | ICD-10-CM | POA: Diagnosis not present

## 2017-07-16 DIAGNOSIS — M353 Polymyalgia rheumatica: Secondary | ICD-10-CM | POA: Diagnosis not present

## 2017-07-16 DIAGNOSIS — G20 Parkinson's disease: Secondary | ICD-10-CM | POA: Diagnosis not present

## 2017-07-16 DIAGNOSIS — I2510 Atherosclerotic heart disease of native coronary artery without angina pectoris: Secondary | ICD-10-CM | POA: Diagnosis not present

## 2017-07-16 DIAGNOSIS — I509 Heart failure, unspecified: Secondary | ICD-10-CM | POA: Diagnosis not present

## 2017-07-16 DIAGNOSIS — N183 Chronic kidney disease, stage 3 (moderate): Secondary | ICD-10-CM | POA: Diagnosis not present

## 2017-07-16 DIAGNOSIS — L89322 Pressure ulcer of left buttock, stage 2: Secondary | ICD-10-CM | POA: Diagnosis not present

## 2017-07-20 DIAGNOSIS — L89322 Pressure ulcer of left buttock, stage 2: Secondary | ICD-10-CM | POA: Diagnosis not present

## 2017-07-20 DIAGNOSIS — N183 Chronic kidney disease, stage 3 (moderate): Secondary | ICD-10-CM | POA: Diagnosis not present

## 2017-07-20 DIAGNOSIS — I2510 Atherosclerotic heart disease of native coronary artery without angina pectoris: Secondary | ICD-10-CM | POA: Diagnosis not present

## 2017-07-20 DIAGNOSIS — I509 Heart failure, unspecified: Secondary | ICD-10-CM | POA: Diagnosis not present

## 2017-07-20 DIAGNOSIS — M353 Polymyalgia rheumatica: Secondary | ICD-10-CM | POA: Diagnosis not present

## 2017-07-20 DIAGNOSIS — G20 Parkinson's disease: Secondary | ICD-10-CM | POA: Diagnosis not present

## 2017-07-21 DIAGNOSIS — I2510 Atherosclerotic heart disease of native coronary artery without angina pectoris: Secondary | ICD-10-CM | POA: Diagnosis not present

## 2017-07-21 DIAGNOSIS — G20 Parkinson's disease: Secondary | ICD-10-CM | POA: Diagnosis not present

## 2017-07-21 DIAGNOSIS — I509 Heart failure, unspecified: Secondary | ICD-10-CM | POA: Diagnosis not present

## 2017-07-21 DIAGNOSIS — N183 Chronic kidney disease, stage 3 (moderate): Secondary | ICD-10-CM | POA: Diagnosis not present

## 2017-07-21 DIAGNOSIS — M353 Polymyalgia rheumatica: Secondary | ICD-10-CM | POA: Diagnosis not present

## 2017-07-21 DIAGNOSIS — L89322 Pressure ulcer of left buttock, stage 2: Secondary | ICD-10-CM | POA: Diagnosis not present

## 2017-07-22 DIAGNOSIS — I509 Heart failure, unspecified: Secondary | ICD-10-CM | POA: Diagnosis not present

## 2017-07-22 DIAGNOSIS — M353 Polymyalgia rheumatica: Secondary | ICD-10-CM | POA: Diagnosis not present

## 2017-07-22 DIAGNOSIS — L89322 Pressure ulcer of left buttock, stage 2: Secondary | ICD-10-CM | POA: Diagnosis not present

## 2017-07-22 DIAGNOSIS — I2510 Atherosclerotic heart disease of native coronary artery without angina pectoris: Secondary | ICD-10-CM | POA: Diagnosis not present

## 2017-07-22 DIAGNOSIS — R062 Wheezing: Secondary | ICD-10-CM | POA: Diagnosis not present

## 2017-07-22 DIAGNOSIS — N183 Chronic kidney disease, stage 3 (moderate): Secondary | ICD-10-CM | POA: Diagnosis not present

## 2017-07-22 DIAGNOSIS — G20 Parkinson's disease: Secondary | ICD-10-CM | POA: Diagnosis not present

## 2017-07-24 DIAGNOSIS — G20 Parkinson's disease: Secondary | ICD-10-CM | POA: Diagnosis not present

## 2017-07-24 DIAGNOSIS — I2510 Atherosclerotic heart disease of native coronary artery without angina pectoris: Secondary | ICD-10-CM | POA: Diagnosis not present

## 2017-07-24 DIAGNOSIS — N183 Chronic kidney disease, stage 3 (moderate): Secondary | ICD-10-CM | POA: Diagnosis not present

## 2017-07-24 DIAGNOSIS — L89322 Pressure ulcer of left buttock, stage 2: Secondary | ICD-10-CM | POA: Diagnosis not present

## 2017-07-24 DIAGNOSIS — M353 Polymyalgia rheumatica: Secondary | ICD-10-CM | POA: Diagnosis not present

## 2017-07-24 DIAGNOSIS — I509 Heart failure, unspecified: Secondary | ICD-10-CM | POA: Diagnosis not present

## 2017-07-27 DIAGNOSIS — N183 Chronic kidney disease, stage 3 (moderate): Secondary | ICD-10-CM | POA: Diagnosis not present

## 2017-07-27 DIAGNOSIS — L89322 Pressure ulcer of left buttock, stage 2: Secondary | ICD-10-CM | POA: Diagnosis not present

## 2017-07-27 DIAGNOSIS — G20 Parkinson's disease: Secondary | ICD-10-CM | POA: Diagnosis not present

## 2017-07-27 DIAGNOSIS — I509 Heart failure, unspecified: Secondary | ICD-10-CM | POA: Diagnosis not present

## 2017-07-27 DIAGNOSIS — M353 Polymyalgia rheumatica: Secondary | ICD-10-CM | POA: Diagnosis not present

## 2017-07-27 DIAGNOSIS — I2510 Atherosclerotic heart disease of native coronary artery without angina pectoris: Secondary | ICD-10-CM | POA: Diagnosis not present

## 2017-07-31 DIAGNOSIS — L89322 Pressure ulcer of left buttock, stage 2: Secondary | ICD-10-CM | POA: Diagnosis not present

## 2017-07-31 DIAGNOSIS — G20 Parkinson's disease: Secondary | ICD-10-CM | POA: Diagnosis not present

## 2017-07-31 DIAGNOSIS — N183 Chronic kidney disease, stage 3 (moderate): Secondary | ICD-10-CM | POA: Diagnosis not present

## 2017-07-31 DIAGNOSIS — I2510 Atherosclerotic heart disease of native coronary artery without angina pectoris: Secondary | ICD-10-CM | POA: Diagnosis not present

## 2017-07-31 DIAGNOSIS — M353 Polymyalgia rheumatica: Secondary | ICD-10-CM | POA: Diagnosis not present

## 2017-07-31 DIAGNOSIS — I509 Heart failure, unspecified: Secondary | ICD-10-CM | POA: Diagnosis not present

## 2017-08-01 DIAGNOSIS — I2510 Atherosclerotic heart disease of native coronary artery without angina pectoris: Secondary | ICD-10-CM | POA: Diagnosis not present

## 2017-08-01 DIAGNOSIS — Z7952 Long term (current) use of systemic steroids: Secondary | ICD-10-CM | POA: Diagnosis not present

## 2017-08-01 DIAGNOSIS — S91105D Unspecified open wound of left lesser toe(s) without damage to nail, subsequent encounter: Secondary | ICD-10-CM | POA: Diagnosis not present

## 2017-08-01 DIAGNOSIS — Z940 Kidney transplant status: Secondary | ICD-10-CM | POA: Diagnosis not present

## 2017-08-01 DIAGNOSIS — Z951 Presence of aortocoronary bypass graft: Secondary | ICD-10-CM | POA: Diagnosis not present

## 2017-08-01 DIAGNOSIS — E039 Hypothyroidism, unspecified: Secondary | ICD-10-CM | POA: Diagnosis not present

## 2017-08-01 DIAGNOSIS — E785 Hyperlipidemia, unspecified: Secondary | ICD-10-CM | POA: Diagnosis not present

## 2017-08-01 DIAGNOSIS — I509 Heart failure, unspecified: Secondary | ICD-10-CM | POA: Diagnosis not present

## 2017-08-01 DIAGNOSIS — N183 Chronic kidney disease, stage 3 (moderate): Secondary | ICD-10-CM | POA: Diagnosis not present

## 2017-08-01 DIAGNOSIS — Z87440 Personal history of urinary (tract) infections: Secondary | ICD-10-CM | POA: Diagnosis not present

## 2017-08-01 DIAGNOSIS — M353 Polymyalgia rheumatica: Secondary | ICD-10-CM | POA: Diagnosis not present

## 2017-08-01 DIAGNOSIS — Z87891 Personal history of nicotine dependence: Secondary | ICD-10-CM | POA: Diagnosis not present

## 2017-08-01 DIAGNOSIS — G20 Parkinson's disease: Secondary | ICD-10-CM | POA: Diagnosis not present

## 2017-08-04 DIAGNOSIS — S91105D Unspecified open wound of left lesser toe(s) without damage to nail, subsequent encounter: Secondary | ICD-10-CM | POA: Diagnosis not present

## 2017-08-04 DIAGNOSIS — G20 Parkinson's disease: Secondary | ICD-10-CM | POA: Diagnosis not present

## 2017-08-04 DIAGNOSIS — M353 Polymyalgia rheumatica: Secondary | ICD-10-CM | POA: Diagnosis not present

## 2017-08-04 DIAGNOSIS — I2510 Atherosclerotic heart disease of native coronary artery without angina pectoris: Secondary | ICD-10-CM | POA: Diagnosis not present

## 2017-08-04 DIAGNOSIS — N183 Chronic kidney disease, stage 3 (moderate): Secondary | ICD-10-CM | POA: Diagnosis not present

## 2017-08-04 DIAGNOSIS — I509 Heart failure, unspecified: Secondary | ICD-10-CM | POA: Diagnosis not present

## 2017-08-06 DIAGNOSIS — N183 Chronic kidney disease, stage 3 (moderate): Secondary | ICD-10-CM | POA: Diagnosis not present

## 2017-08-06 DIAGNOSIS — E039 Hypothyroidism, unspecified: Secondary | ICD-10-CM | POA: Diagnosis not present

## 2017-08-06 DIAGNOSIS — I129 Hypertensive chronic kidney disease with stage 1 through stage 4 chronic kidney disease, or unspecified chronic kidney disease: Secondary | ICD-10-CM | POA: Diagnosis not present

## 2017-08-06 DIAGNOSIS — Q253 Supravalvular aortic stenosis: Secondary | ICD-10-CM | POA: Diagnosis not present

## 2017-08-06 DIAGNOSIS — L97522 Non-pressure chronic ulcer of other part of left foot with fat layer exposed: Secondary | ICD-10-CM | POA: Diagnosis not present

## 2017-08-06 DIAGNOSIS — Z79899 Other long term (current) drug therapy: Secondary | ICD-10-CM | POA: Diagnosis not present

## 2017-08-06 DIAGNOSIS — Z23 Encounter for immunization: Secondary | ICD-10-CM | POA: Diagnosis not present

## 2017-08-06 DIAGNOSIS — R251 Tremor, unspecified: Secondary | ICD-10-CM | POA: Diagnosis not present

## 2017-08-06 DIAGNOSIS — I5032 Chronic diastolic (congestive) heart failure: Secondary | ICD-10-CM | POA: Diagnosis not present

## 2017-08-07 DIAGNOSIS — I509 Heart failure, unspecified: Secondary | ICD-10-CM | POA: Diagnosis not present

## 2017-08-07 DIAGNOSIS — I2510 Atherosclerotic heart disease of native coronary artery without angina pectoris: Secondary | ICD-10-CM | POA: Diagnosis not present

## 2017-08-07 DIAGNOSIS — S91105D Unspecified open wound of left lesser toe(s) without damage to nail, subsequent encounter: Secondary | ICD-10-CM | POA: Diagnosis not present

## 2017-08-07 DIAGNOSIS — G20 Parkinson's disease: Secondary | ICD-10-CM | POA: Diagnosis not present

## 2017-08-07 DIAGNOSIS — N183 Chronic kidney disease, stage 3 (moderate): Secondary | ICD-10-CM | POA: Diagnosis not present

## 2017-08-07 DIAGNOSIS — M353 Polymyalgia rheumatica: Secondary | ICD-10-CM | POA: Diagnosis not present

## 2017-08-11 DIAGNOSIS — G20 Parkinson's disease: Secondary | ICD-10-CM | POA: Diagnosis not present

## 2017-08-11 DIAGNOSIS — I2510 Atherosclerotic heart disease of native coronary artery without angina pectoris: Secondary | ICD-10-CM | POA: Diagnosis not present

## 2017-08-11 DIAGNOSIS — S91105D Unspecified open wound of left lesser toe(s) without damage to nail, subsequent encounter: Secondary | ICD-10-CM | POA: Diagnosis not present

## 2017-08-11 DIAGNOSIS — I509 Heart failure, unspecified: Secondary | ICD-10-CM | POA: Diagnosis not present

## 2017-08-11 DIAGNOSIS — N183 Chronic kidney disease, stage 3 (moderate): Secondary | ICD-10-CM | POA: Diagnosis not present

## 2017-08-11 DIAGNOSIS — M353 Polymyalgia rheumatica: Secondary | ICD-10-CM | POA: Diagnosis not present

## 2017-08-13 DIAGNOSIS — G20 Parkinson's disease: Secondary | ICD-10-CM | POA: Diagnosis not present

## 2017-08-13 DIAGNOSIS — I509 Heart failure, unspecified: Secondary | ICD-10-CM | POA: Diagnosis not present

## 2017-08-13 DIAGNOSIS — M353 Polymyalgia rheumatica: Secondary | ICD-10-CM | POA: Diagnosis not present

## 2017-08-13 DIAGNOSIS — S91105D Unspecified open wound of left lesser toe(s) without damage to nail, subsequent encounter: Secondary | ICD-10-CM | POA: Diagnosis not present

## 2017-08-13 DIAGNOSIS — I2510 Atherosclerotic heart disease of native coronary artery without angina pectoris: Secondary | ICD-10-CM | POA: Diagnosis not present

## 2017-08-13 DIAGNOSIS — N183 Chronic kidney disease, stage 3 (moderate): Secondary | ICD-10-CM | POA: Diagnosis not present

## 2017-08-17 DIAGNOSIS — I2510 Atherosclerotic heart disease of native coronary artery without angina pectoris: Secondary | ICD-10-CM | POA: Diagnosis not present

## 2017-08-17 DIAGNOSIS — M353 Polymyalgia rheumatica: Secondary | ICD-10-CM | POA: Diagnosis not present

## 2017-08-17 DIAGNOSIS — G20 Parkinson's disease: Secondary | ICD-10-CM | POA: Diagnosis not present

## 2017-08-17 DIAGNOSIS — N183 Chronic kidney disease, stage 3 (moderate): Secondary | ICD-10-CM | POA: Diagnosis not present

## 2017-08-17 DIAGNOSIS — S91105D Unspecified open wound of left lesser toe(s) without damage to nail, subsequent encounter: Secondary | ICD-10-CM | POA: Diagnosis not present

## 2017-08-17 DIAGNOSIS — I509 Heart failure, unspecified: Secondary | ICD-10-CM | POA: Diagnosis not present

## 2017-08-18 ENCOUNTER — Ambulatory Visit: Payer: Medicare Other | Admitting: Podiatry

## 2017-08-19 ENCOUNTER — Ambulatory Visit: Payer: Medicare Other | Admitting: Podiatry

## 2017-08-20 DIAGNOSIS — G20 Parkinson's disease: Secondary | ICD-10-CM | POA: Diagnosis not present

## 2017-08-20 DIAGNOSIS — S91105D Unspecified open wound of left lesser toe(s) without damage to nail, subsequent encounter: Secondary | ICD-10-CM | POA: Diagnosis not present

## 2017-08-20 DIAGNOSIS — I2510 Atherosclerotic heart disease of native coronary artery without angina pectoris: Secondary | ICD-10-CM | POA: Diagnosis not present

## 2017-08-20 DIAGNOSIS — I509 Heart failure, unspecified: Secondary | ICD-10-CM | POA: Diagnosis not present

## 2017-08-20 DIAGNOSIS — N183 Chronic kidney disease, stage 3 (moderate): Secondary | ICD-10-CM | POA: Diagnosis not present

## 2017-08-20 DIAGNOSIS — M353 Polymyalgia rheumatica: Secondary | ICD-10-CM | POA: Diagnosis not present

## 2017-08-24 DIAGNOSIS — G20 Parkinson's disease: Secondary | ICD-10-CM | POA: Diagnosis not present

## 2017-08-24 DIAGNOSIS — I509 Heart failure, unspecified: Secondary | ICD-10-CM | POA: Diagnosis not present

## 2017-08-24 DIAGNOSIS — N183 Chronic kidney disease, stage 3 (moderate): Secondary | ICD-10-CM | POA: Diagnosis not present

## 2017-08-24 DIAGNOSIS — I2510 Atherosclerotic heart disease of native coronary artery without angina pectoris: Secondary | ICD-10-CM | POA: Diagnosis not present

## 2017-08-24 DIAGNOSIS — M353 Polymyalgia rheumatica: Secondary | ICD-10-CM | POA: Diagnosis not present

## 2017-08-24 DIAGNOSIS — S91105D Unspecified open wound of left lesser toe(s) without damage to nail, subsequent encounter: Secondary | ICD-10-CM | POA: Diagnosis not present

## 2017-08-25 DIAGNOSIS — M353 Polymyalgia rheumatica: Secondary | ICD-10-CM | POA: Diagnosis not present

## 2017-08-25 DIAGNOSIS — I2510 Atherosclerotic heart disease of native coronary artery without angina pectoris: Secondary | ICD-10-CM | POA: Diagnosis not present

## 2017-08-25 DIAGNOSIS — N183 Chronic kidney disease, stage 3 (moderate): Secondary | ICD-10-CM | POA: Diagnosis not present

## 2017-08-25 DIAGNOSIS — G20 Parkinson's disease: Secondary | ICD-10-CM | POA: Diagnosis not present

## 2017-08-25 DIAGNOSIS — S91105D Unspecified open wound of left lesser toe(s) without damage to nail, subsequent encounter: Secondary | ICD-10-CM | POA: Diagnosis not present

## 2017-08-25 DIAGNOSIS — I509 Heart failure, unspecified: Secondary | ICD-10-CM | POA: Diagnosis not present

## 2017-08-27 DIAGNOSIS — I509 Heart failure, unspecified: Secondary | ICD-10-CM | POA: Diagnosis not present

## 2017-08-27 DIAGNOSIS — M353 Polymyalgia rheumatica: Secondary | ICD-10-CM | POA: Diagnosis not present

## 2017-08-27 DIAGNOSIS — N183 Chronic kidney disease, stage 3 (moderate): Secondary | ICD-10-CM | POA: Diagnosis not present

## 2017-08-27 DIAGNOSIS — G20 Parkinson's disease: Secondary | ICD-10-CM | POA: Diagnosis not present

## 2017-08-27 DIAGNOSIS — I2510 Atherosclerotic heart disease of native coronary artery without angina pectoris: Secondary | ICD-10-CM | POA: Diagnosis not present

## 2017-08-27 DIAGNOSIS — S91105D Unspecified open wound of left lesser toe(s) without damage to nail, subsequent encounter: Secondary | ICD-10-CM | POA: Diagnosis not present

## 2017-08-31 DIAGNOSIS — I509 Heart failure, unspecified: Secondary | ICD-10-CM | POA: Diagnosis not present

## 2017-08-31 DIAGNOSIS — I2510 Atherosclerotic heart disease of native coronary artery without angina pectoris: Secondary | ICD-10-CM | POA: Diagnosis not present

## 2017-08-31 DIAGNOSIS — G20 Parkinson's disease: Secondary | ICD-10-CM | POA: Diagnosis not present

## 2017-08-31 DIAGNOSIS — N183 Chronic kidney disease, stage 3 (moderate): Secondary | ICD-10-CM | POA: Diagnosis not present

## 2017-08-31 DIAGNOSIS — M353 Polymyalgia rheumatica: Secondary | ICD-10-CM | POA: Diagnosis not present

## 2017-08-31 DIAGNOSIS — S91105D Unspecified open wound of left lesser toe(s) without damage to nail, subsequent encounter: Secondary | ICD-10-CM | POA: Diagnosis not present

## 2017-09-01 ENCOUNTER — Encounter: Payer: Self-pay | Admitting: Podiatry

## 2017-09-01 ENCOUNTER — Ambulatory Visit (INDEPENDENT_AMBULATORY_CARE_PROVIDER_SITE_OTHER): Payer: Medicare Other | Admitting: Podiatry

## 2017-09-01 ENCOUNTER — Telehealth: Payer: Self-pay | Admitting: Podiatry

## 2017-09-01 VITALS — BP 113/70 | HR 83

## 2017-09-01 DIAGNOSIS — B351 Tinea unguium: Secondary | ICD-10-CM

## 2017-09-01 DIAGNOSIS — L97524 Non-pressure chronic ulcer of other part of left foot with necrosis of bone: Secondary | ICD-10-CM

## 2017-09-01 DIAGNOSIS — M79609 Pain in unspecified limb: Principal | ICD-10-CM

## 2017-09-01 DIAGNOSIS — M79676 Pain in unspecified toe(s): Secondary | ICD-10-CM

## 2017-09-01 MED ORDER — CLINDAMYCIN HCL 150 MG PO CAPS: 150.0000 mg | ORAL_CAPSULE | Freq: Three times a day (TID) | ORAL | 0 refills | Status: DC

## 2017-09-01 MED ORDER — MUPIROCIN 2 % EX OINT: TOPICAL_OINTMENT | CUTANEOUS | 2 refills | Status: AC

## 2017-09-01 NOTE — Progress Notes (Signed)
He presents today in a wheelchair as a new patient from an extended care facility. His assistant is with him today stating that his second toe on his left foot has an ulceration which has been present for at least 2 months. States that he has only been with them at their facility for 2 months and it was this way prior to him coming to their facility. She states that the nurse at the extended care facility has been dressing his wound daily. States that is severely painful.  Objective:Vital signs are stable pulses are barely palpable bilateral feet or venous blue. Hammertoe deformity and ulcerationsecond digit left foot. Transverse ulceration or laceration which appears to have included the tendon demonstrates the heaf the proximal phalanx.  Assessment peripheral vascular disease. Ulcerationwith probable osteomyelitis second digit left foot.  Plan: Started him on clindamycin orally and Bactroban ointment daily with dressing. Also placed in a Darco shoe and requested vascular evaluation. He would like to have this taken care of immediately. He states that he will still be better.  I am going to request that he start seeing Dr. Amalia Hailey since I feel that this individual may ultimately need a amputation of his second toe which will have to be hospitalized.

## 2017-09-01 NOTE — Telephone Encounter (Signed)
This, is [place] calling from [employer]. He was seen by Dr. Milinda Pointer today. Dr. Milinda Pointer wrote an order that is incomplete and I need clarification of the order. Please fax the order to [phone]. Thank you and have a good day.

## 2017-09-01 NOTE — Telephone Encounter (Signed)
This is Conservation officer, historic buildings from [employer]. Calling with a question on an order. We have some conflicting directions on the clindamycin order. On the med list that was printed out it says 150 mg three times a day but there was also a hand written order that says just twice a day. Need clarification on dosage and the duration pt is to take medication. Please call us back at [phone]. Thank you. Bye bye.

## 2017-09-02 ENCOUNTER — Telehealth: Payer: Self-pay | Admitting: Podiatry

## 2017-09-02 DIAGNOSIS — R0989 Other specified symptoms and signs involving the circulatory and respiratory systems: Secondary | ICD-10-CM

## 2017-09-02 NOTE — Telephone Encounter (Signed)
This is Barry Wright from [facility] calling to follow up on orders for the pt. One order has pt to take clindamycin to take TID and one BID, we need to know which one is correct. Same issue for Bactroban one saying Daily the other BID. We need clarification on that. Please call us back at [phone]. Thank you.

## 2017-09-02 NOTE — Telephone Encounter (Signed)
I informed [place], the clindamycin 150mg  was to be tid, and bactroban to wound daily.

## 2017-09-02 NOTE — Telephone Encounter (Signed)
I informed [place] the clindamycin is to be tid and the Bactroban was to be applied to wound.

## 2017-09-02 NOTE — Telephone Encounter (Signed)
It is tid.  I corrected it on the hand written order.

## 2017-09-03 DIAGNOSIS — M353 Polymyalgia rheumatica: Secondary | ICD-10-CM | POA: Diagnosis not present

## 2017-09-03 DIAGNOSIS — G20 Parkinson's disease: Secondary | ICD-10-CM | POA: Diagnosis not present

## 2017-09-03 DIAGNOSIS — I2510 Atherosclerotic heart disease of native coronary artery without angina pectoris: Secondary | ICD-10-CM | POA: Diagnosis not present

## 2017-09-03 DIAGNOSIS — N183 Chronic kidney disease, stage 3 (moderate): Secondary | ICD-10-CM | POA: Diagnosis not present

## 2017-09-03 DIAGNOSIS — S91105D Unspecified open wound of left lesser toe(s) without damage to nail, subsequent encounter: Secondary | ICD-10-CM | POA: Diagnosis not present

## 2017-09-03 DIAGNOSIS — I509 Heart failure, unspecified: Secondary | ICD-10-CM | POA: Diagnosis not present

## 2017-09-03 MED ORDER — DOXYCYCLINE HYCLATE 100 MG PO CAPS: 100.0000 mg | ORAL_CAPSULE | Freq: Two times a day (BID) | ORAL | 0 refills | Status: DC

## 2017-09-03 NOTE — Addendum Note (Signed)
Addended by: Harriett Sine D on: 09/03/2017 11:29 AM   Modules accepted: Orders

## 2017-09-03 NOTE — Telephone Encounter (Signed)
Hi, this is Theatre stage manager from [facility]. I spoke to [place] earlier today and she told me the pt was going to get started on clindamycin TID and the Bactroban ointment daily to the left 2nd toe. The assisted living facility stated they have not started the pt on the medications because they received a message from [place] saying there is an interaction between the clindamycin and another medication he is currently taking. They stated they reached out to you guys about getting it changed. If you want to give me a call back, my number is [phone]. Thanks.

## 2017-09-03 NOTE — Telephone Encounter (Addendum)
I spoke with [place] and she said there was a problem with the clindamycin and another of the pt's medication. Dr. Milinda Pointer ordered d/c clindamycin, order doxycycline 100mg  #20 one capsule bid.[facility] - Amy, I d/c clindamycin, and ordered doxycycline. [place] states they have to have new orders faxed. Faxed orders to [facility].

## 2017-09-03 NOTE — Telephone Encounter (Addendum)
I informed [place] of the Bactroban dressing daily. Beth states pt is in assisted living facility - [facility], which does not do wound care, and [facility] does not do daily dressing changes at assisted living facilities. Dr. Milinda Pointer ordered dressing changes to the left 2nd toe 3 x week, if assisted living will not perform wound care. Orders given to [facility] and she states she will talk to [facility] staff and if they will do the bactroban simple dressing she will have the perform daily.

## 2017-09-03 NOTE — Addendum Note (Signed)
Addended by: Harriett Sine D on: 09/03/2017 03:15 PM   Modules accepted: Orders

## 2017-09-03 NOTE — Telephone Encounter (Signed)
Hi, this is Corporate treasurer, Therapist, sports with [place]. I'm out seeing pt for wound care and my understanding is he had an appointment with you yesterday for the wound on his 2nd left toe. I was wanting to know if there are any new wound care orders we need to be following. Please call me back at [phone]. Thank you.

## 2017-09-08 ENCOUNTER — Ambulatory Visit (INDEPENDENT_AMBULATORY_CARE_PROVIDER_SITE_OTHER): Payer: Medicare Other | Admitting: Podiatry

## 2017-09-08 ENCOUNTER — Encounter: Payer: Self-pay | Admitting: Podiatry

## 2017-09-08 DIAGNOSIS — M869 Osteomyelitis, unspecified: Secondary | ICD-10-CM

## 2017-09-08 DIAGNOSIS — L97524 Non-pressure chronic ulcer of other part of left foot with necrosis of bone: Secondary | ICD-10-CM

## 2017-09-09 ENCOUNTER — Ambulatory Visit: Payer: Medicare Other | Admitting: Podiatry

## 2017-09-09 NOTE — Progress Notes (Signed)
Subjective: Barry Wright presents the office today with a caregiver from his facility for follow-up evaluation of a wound to left second toe. They're unsure of how his been doing that [employer] from our office. Patient last appointment with Dr. Milinda Pointer as well as she felt that the toe is looking better. There is numbness any drainage or pus coming from the toe in Court his medication was used on doxycycline. He's been getting mupirocin ointment dressing changes of the wound and the left second toe daily. Denies any systemic complaints such as fevers, chills, nausea, vomiting. No acute changes since last appointment, and no other complaints at this time.   Objective: NAD; presents today in a wheelchair DP/PT pulses decreased bilaterally There is a wound to the left dorsal PIPJ and there is direct probing down to bone. Prior to evaluation of wound there is a hyperkeratotic cap along the area which was loose and easily removed. There is no drainage or pus expressed and there is no areas of fluctuance or crepitus. There is no malodor. There is no ascending cellulitis. There is no other open lesions or pre-ulcerative lesions identified this time. No open lesions or pre-ulcerative lesions.  No pain with calf compression, swelling, warmth, erythema  Assessment: Wound with osteomyelitis left second toe  Plan: -All treatment options discussed with the patient including all alternatives, risks, complications.  -Osteomyelitis likely given direct probing to bone. Unable to get x-rays in the office today given a wheelchair because not get the x-rays. Also he is scheduled for vascular studies next 2 weeks. Unfortunately can ongoing certain days to get the ultrasound done. Discussed possible amputation of the toe but prior to this been need medical clearance as well as vascular evaluation pending the studies. For now we will switch to using Betadine dressing changes daily as there was quite a bit of drainage on the  bandages although serosanguineous. We'll continue doxycycline and order was provided for this. I'll see him back next 77 days to make sure the infection not worsening discussed the infection get worse quickly as it does go directly to the emergency room. -Patient encouraged to call the office with any questions, concerns, change in symptoms.   Celesta Gentile, DPM

## 2017-09-10 DIAGNOSIS — M353 Polymyalgia rheumatica: Secondary | ICD-10-CM | POA: Diagnosis not present

## 2017-09-10 DIAGNOSIS — S91105D Unspecified open wound of left lesser toe(s) without damage to nail, subsequent encounter: Secondary | ICD-10-CM | POA: Diagnosis not present

## 2017-09-10 DIAGNOSIS — N183 Chronic kidney disease, stage 3 (moderate): Secondary | ICD-10-CM | POA: Diagnosis not present

## 2017-09-10 DIAGNOSIS — I509 Heart failure, unspecified: Secondary | ICD-10-CM | POA: Diagnosis not present

## 2017-09-10 DIAGNOSIS — G20 Parkinson's disease: Secondary | ICD-10-CM | POA: Diagnosis not present

## 2017-09-10 DIAGNOSIS — I2510 Atherosclerotic heart disease of native coronary artery without angina pectoris: Secondary | ICD-10-CM | POA: Diagnosis not present

## 2017-09-11 DIAGNOSIS — M79604 Pain in right leg: Secondary | ICD-10-CM | POA: Diagnosis not present

## 2017-09-11 DIAGNOSIS — R600 Localized edema: Secondary | ICD-10-CM | POA: Diagnosis not present

## 2017-09-14 ENCOUNTER — Telehealth: Payer: Self-pay | Admitting: Podiatry

## 2017-09-14 DIAGNOSIS — I509 Heart failure, unspecified: Secondary | ICD-10-CM | POA: Diagnosis not present

## 2017-09-14 DIAGNOSIS — S91105D Unspecified open wound of left lesser toe(s) without damage to nail, subsequent encounter: Secondary | ICD-10-CM | POA: Diagnosis not present

## 2017-09-14 DIAGNOSIS — M353 Polymyalgia rheumatica: Secondary | ICD-10-CM | POA: Diagnosis not present

## 2017-09-14 DIAGNOSIS — N183 Chronic kidney disease, stage 3 (moderate): Secondary | ICD-10-CM | POA: Diagnosis not present

## 2017-09-14 DIAGNOSIS — I2510 Atherosclerotic heart disease of native coronary artery without angina pectoris: Secondary | ICD-10-CM | POA: Diagnosis not present

## 2017-09-14 DIAGNOSIS — G20 Parkinson's disease: Secondary | ICD-10-CM | POA: Diagnosis not present

## 2017-09-14 NOTE — Telephone Encounter (Signed)
I will see him tomorrow but if there is any worsening then go to the ER

## 2017-09-14 NOTE — Telephone Encounter (Signed)
This is Corporate treasurer, Therapist, sports with [place].  Pt is being treated by Dr. Milinda Pointer and Dr. Jacqualyn Posey. He has a follow up appointment tomorrow with Dr. Jacqualyn Posey for a wound on his second left toe. Pt has a new blackened area at the base of his left great toe. Its new and he doesn't remember any trauma to the area and say's its not painful but it is a new area. I just wanted to let you know. If you need to speak with me, you can call me at [phone].

## 2017-09-15 ENCOUNTER — Ambulatory Visit (INDEPENDENT_AMBULATORY_CARE_PROVIDER_SITE_OTHER): Payer: Medicare Other | Admitting: Podiatry

## 2017-09-15 ENCOUNTER — Encounter: Payer: Self-pay | Admitting: Podiatry

## 2017-09-15 DIAGNOSIS — M869 Osteomyelitis, unspecified: Secondary | ICD-10-CM

## 2017-09-15 DIAGNOSIS — L97524 Non-pressure chronic ulcer of other part of left foot with necrosis of bone: Secondary | ICD-10-CM

## 2017-09-16 ENCOUNTER — Telehealth: Payer: Self-pay | Admitting: *Deleted

## 2017-09-16 NOTE — Telephone Encounter (Signed)
[place] state pt was seen 09/15/2017 and was given a 2 week rx for doxycycline, and is this to be continued from the doxycycline he is currently on? Left message for [place] to call for information concerning the doxycycline.

## 2017-09-16 NOTE — Progress Notes (Signed)
Subjective: Barry Wright presents the office today with a caregiver from his facility for follow-up evaluation of a wound to left second toe. He presents today with his daughter. She is unsure of how his been looking but she states that according to the nurse is not looking any better but does not look any worse. He apparently is continued antibiotics. Beginning Bactroban dressing changes according to the daughter that the bandage appears to be a Betadine was taken off today. He has no other concerns today. He denies any pain. Denies any systemic complaints such as fevers, chills, nausea, vomiting. No acute changes since last appointment, and no other complaints at this time.   Objective: NAD; presents today in a wheelchair DP/PT pulses decreased bilaterally There is a wound to the left dorsal PIPJ and there is direct probing down to bone however today there is starting to be some granulation tissue along the wound. Overall the wound actually appears to be improved. There is no drainage or pus coming from the wound the second toe there is no ascending cellulitis. There is mild edema to the foot. There does appear to be an old hemorrhagic blister to the base of the hallux as well as along the bunion site. There is no areas of questions or crepitus. Overall the foot appears to be looking better compared to when I last saw him.  No open lesions or pre-ulcerative lesions.  No pain with calf compression, swelling, warmth, erythema      Assessment: Wound with osteomyelitis left second toe  Plan: -All treatment options discussed with the patient including all alternatives, risks, complications.  -Continue doxycycline. We will continue Betadine dressing changes daily to help keep the area dry and clean. Continue a surgical shoe to help immobilize the toe. He scheduled for vascular studies next week. I'll see him back after that. Discussed with his daughter that this is a signs or symptoms of worsening  infections to go immediately to the emergency room she verbally understood this. I also gave her the time and location of the vascular studies.  Celesta Gentile, DPM

## 2017-09-17 NOTE — Telephone Encounter (Signed)
[place] states needs to know the instructions for the doxycycline, can leave message with Franchot Gallo or Levada Dy.  I informed Franchot Gallo that pt was to complete the 09/03/2017, then begin the rx of 09/15/2017. Faxed orders to [facility].

## 2017-09-21 ENCOUNTER — Ambulatory Visit (HOSPITAL_COMMUNITY)

## 2017-09-21 ENCOUNTER — Telehealth: Payer: Self-pay | Admitting: Podiatry

## 2017-09-21 DIAGNOSIS — S91105D Unspecified open wound of left lesser toe(s) without damage to nail, subsequent encounter: Secondary | ICD-10-CM | POA: Diagnosis not present

## 2017-09-21 DIAGNOSIS — N183 Chronic kidney disease, stage 3 (moderate): Secondary | ICD-10-CM | POA: Diagnosis not present

## 2017-09-21 DIAGNOSIS — I509 Heart failure, unspecified: Secondary | ICD-10-CM | POA: Diagnosis not present

## 2017-09-21 DIAGNOSIS — G20 Parkinson's disease: Secondary | ICD-10-CM | POA: Diagnosis not present

## 2017-09-21 DIAGNOSIS — M353 Polymyalgia rheumatica: Secondary | ICD-10-CM | POA: Diagnosis not present

## 2017-09-21 DIAGNOSIS — I2510 Atherosclerotic heart disease of native coronary artery without angina pectoris: Secondary | ICD-10-CM | POA: Diagnosis not present

## 2017-09-21 NOTE — Telephone Encounter (Signed)
Hi, this is Corporate treasurer, Therapist, sports with [place]. Calling about orders for daily wet to dry dressing changes with betadine to the second left toe. That is to be done by his assisted living facility staff. I went out to see the pt today and the second toe is healing somewhat. However, now he has two large wounds, one on the medial side of his left foot kind of below the great toe and then one on the outer aspect of his foot below the pinky toe. Both the pinky toe and 3/4 of his fourth toe are blackened. Pt had a dressing on, a non adhering dressing with a lot of paper tape to the skin. I spoke to [place], Retail buyer there about when the new wounds appeared and she stated she didn't know. I wanted to follow up and let you know about the new wounds and what you would like done. They seem superficial but those two toes are blackened. If you would, please call me back at [phone]. Thank you.

## 2017-09-21 NOTE — Telephone Encounter (Signed)
Routed message to Dr. Amalia Hailey.

## 2017-09-21 NOTE — Telephone Encounter (Signed)
I spoke with [facility], informed her of Dr. Leigh Aurora request. Suanne Marker states they are not able to get pt to our office today, and he has an 2hour appt tomorrow at 1:30pm. I told her they could bring pt after the 2 hour appt, that pt may have to be here at least 1 hour. Suanne Marker states their driver gets off work at 3:30pm. I spoke with Dr. Jacqualyn Posey, he stated have pt keep the [facility] and our appts. I informed Suanne Marker.

## 2017-09-21 NOTE — Telephone Encounter (Signed)
Please see if he can come in Tuesday or today to get checked.

## 2017-09-22 ENCOUNTER — Ambulatory Visit (HOSPITAL_COMMUNITY)
Admission: RE | Admit: 2017-09-22 | Discharge: 2017-09-22 | Disposition: A | Discharge status: Home / Self Care | Payer: Medicare Other | Source: Ambulatory Visit | Attending: Cardiology | Admitting: Cardiology

## 2017-09-22 DIAGNOSIS — R0989 Other specified symptoms and signs involving the circulatory and respiratory systems: Secondary | ICD-10-CM | POA: Diagnosis not present

## 2017-09-24 ENCOUNTER — Telehealth: Payer: Self-pay | Admitting: *Deleted

## 2017-09-24 ENCOUNTER — Ambulatory Visit (INDEPENDENT_AMBULATORY_CARE_PROVIDER_SITE_OTHER): Payer: Medicare Other | Admitting: Podiatry

## 2017-09-24 ENCOUNTER — Encounter: Payer: Self-pay | Admitting: Podiatry

## 2017-09-24 DIAGNOSIS — R0989 Other specified symptoms and signs involving the circulatory and respiratory systems: Secondary | ICD-10-CM

## 2017-09-24 DIAGNOSIS — I739 Peripheral vascular disease, unspecified: Secondary | ICD-10-CM

## 2017-09-24 DIAGNOSIS — R234 Changes in skin texture: Secondary | ICD-10-CM

## 2017-09-24 DIAGNOSIS — L97524 Non-pressure chronic ulcer of other part of left foot with necrosis of bone: Secondary | ICD-10-CM | POA: Diagnosis not present

## 2017-09-24 NOTE — Telephone Encounter (Signed)
DR. Jacqualyn Posey states pt had dopplers 09/22/2017 and has blockage arterially and clots venously and needs to get in as soon as possible. I spoke with [facility] - [facility] and pt is schedule for 09/29/2017 2:00pm arrive at 1:pm. I wrote the appt time on a prescription and gave to pt's caregiver - Gwen from [facility].

## 2017-09-25 ENCOUNTER — Telehealth: Payer: Self-pay | Admitting: Podiatry

## 2017-09-25 DIAGNOSIS — N183 Chronic kidney disease, stage 3 (moderate): Secondary | ICD-10-CM | POA: Diagnosis not present

## 2017-09-25 DIAGNOSIS — M353 Polymyalgia rheumatica: Secondary | ICD-10-CM | POA: Diagnosis not present

## 2017-09-25 DIAGNOSIS — G20 Parkinson's disease: Secondary | ICD-10-CM | POA: Diagnosis not present

## 2017-09-25 DIAGNOSIS — I2510 Atherosclerotic heart disease of native coronary artery without angina pectoris: Secondary | ICD-10-CM | POA: Diagnosis not present

## 2017-09-25 DIAGNOSIS — S91105D Unspecified open wound of left lesser toe(s) without damage to nail, subsequent encounter: Secondary | ICD-10-CM | POA: Diagnosis not present

## 2017-09-25 DIAGNOSIS — I509 Heart failure, unspecified: Secondary | ICD-10-CM | POA: Diagnosis not present

## 2017-09-25 NOTE — Telephone Encounter (Signed)
He can come out of the boot. Elevate/float heels at all times.

## 2017-09-25 NOTE — Telephone Encounter (Signed)
I informed Beth, RN - [facility] Dr. Jacqualyn Posey had ordered pt's heels to be elevated or floating at all times. Beth, RN asked the orders be sent to [facility]. FAxed orders to [facility].

## 2017-09-25 NOTE — Telephone Encounter (Signed)
This is Engineer, agricultural with [name]. Pt saw Dr. Jacqualyn Posey earlier this week. The orders at the assisted living where Barry Wright lives are for the pt to wear the surgical boot to his left foot at all times. He is complaining of pain to his left heel. He does have a little bit of like a stage 1 pressure sore on his left heel. He has had a previous sore to that site before. I was calling to see if its okay if he comes out of the surgical boot some or does he have to stay in it? If he can come out of it some during the day can you please fax those orders to his assisted living. You can contact me at [phone]. Thanks.

## 2017-09-27 NOTE — Progress Notes (Signed)
Subjective: 83 year old male presents the office with his caregiver as well as daughter for evaluation of a wound to the second toe.  Also received a call earlier this week stating he has 2 large wounds to his feet now as well.  He presents today for evaluation.  His caregivers not noticed any drainage or pus or any increase in redness or red streaks.  He also presents today to discuss arterial study results.  He has no other concerns today.Denies any systemic complaints such as fevers, chills, nausea, vomiting. No acute changes since last appointment, and no other complaints at this time.   Objective: AAO x3, NAD; presents in a wheelchair DP/PT pulses decreased bilaterally The wound to the dorsal aspect of the left second toe appears to be healing well.  Still able to probe to bone somewhat but overall the wound is improving in size.  There is no drainage or pus expressed.  There is a large eschar present to the medial aspect of the foot as well as starting on the fifth digit as well.  There is no areas of fluctuance or crepitus.  There is no ascending cellulitis.  There is no malodor. There is no pain or pressure to the heel present. No open lesions or pre-ulcerative lesions.  No pain with calf compression, swelling, warmth, erythema        Assessment: Large wound left foot with PAD  Plan: -All treatment options discussed with the patient including all alternatives, risks, complications.  -I reviewed arterial results with him.  Also had received venous duplex studies from his primary care physician which also revealed an acute nonocclusive DVT in the right common femoral, right proximal femoral, right mid femoral and right distal femoral veins as well as an acute nonocclusive thrombus within the right greater saphenous vein, superficial vein.  Given the DVT his medication was adjusted.  Given the arterial abnormalities I can medicate him in the next week to see Dr. Alvester Chou also given that the  foot has been worsening. -We will continue antibiotics, doxycycline -Continue Betadine wet-to-dry dressings for now. -Offloading at all times -Discussed that there is any worsening he is to go immediately to the emergency room. -Patient encouraged to call the office with any questions, concerns, change in symptoms.   Trula Slade DPM

## 2017-09-29 ENCOUNTER — Encounter: Payer: Self-pay | Admitting: Cardiovascular Disease

## 2017-09-29 ENCOUNTER — Ambulatory Visit (INDEPENDENT_AMBULATORY_CARE_PROVIDER_SITE_OTHER): Payer: Medicare Other | Admitting: Cardiovascular Disease

## 2017-09-29 DIAGNOSIS — I509 Heart failure, unspecified: Secondary | ICD-10-CM | POA: Diagnosis not present

## 2017-09-29 DIAGNOSIS — G20 Parkinson's disease: Secondary | ICD-10-CM | POA: Diagnosis not present

## 2017-09-29 DIAGNOSIS — I2510 Atherosclerotic heart disease of native coronary artery without angina pectoris: Secondary | ICD-10-CM | POA: Diagnosis not present

## 2017-09-29 DIAGNOSIS — I998 Other disorder of circulatory system: Secondary | ICD-10-CM

## 2017-09-29 DIAGNOSIS — S91105D Unspecified open wound of left lesser toe(s) without damage to nail, subsequent encounter: Secondary | ICD-10-CM | POA: Diagnosis not present

## 2017-09-29 DIAGNOSIS — N183 Chronic kidney disease, stage 3 (moderate): Secondary | ICD-10-CM | POA: Diagnosis not present

## 2017-09-29 DIAGNOSIS — M353 Polymyalgia rheumatica: Secondary | ICD-10-CM | POA: Diagnosis not present

## 2017-09-29 DIAGNOSIS — I70229 Atherosclerosis of native arteries of extremities with rest pain, unspecified extremity: Secondary | ICD-10-CM | POA: Insufficient documentation

## 2017-09-29 NOTE — Progress Notes (Signed)
09/29/2017 Barry Wright   Dec 04, 1919  [id number]  Primary Physician Lajean Manes, MD Primary Cardiologist: Lorretta Harp MD Lupe Carney, [place]  HPI:  Barry Wright is a 83 y.o. male married, father of 2, grandfather 5 grandchildren and is retired from being in the [employer]. He was referred by Dr. Jacqualyn Posey, his podiatrist for peripheral vascular evaluation because of critical limb ischemia. He does have a history of remote bypass grafting, congestive heart failure, carotid disease. He has a stroke in the past. He is wheelchair bound and has not seen Dr. Pernell Dupre, his cardiologist in several years. [facility] with his wife. He developed gangrenous wounds on his left foot within the last month or 2 and saw Dr. Earleen Newport. 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   Current Meds  Medication Sig  . acetaminophen (TYLENOL) 500 MG tablet Take 1,000 mg by mouth 2 (two) times daily.   . clopidogrel (PLAVIX) 75 MG tablet   . doxycycline (VIBRAMYCIN) 100 MG capsule Take 1 capsule (100 mg total) by mouth 2 (two) times daily.  Marland Kitchen ELIQUIS 2.5 MG TABS tablet   . feeding supplement, ENSURE ENLIVE, (ENSURE ENLIVE) LIQD Take 237 mLs by mouth 2 (two) times daily between meals.  Marland Kitchen FOLIC ACID PO Take by mouth.  . furosemide (LASIX) 20 MG tablet Take 0.5 tablets (10 mg total) by mouth daily. Resume on Saturday 01/31/2017.  . gabapentin (NEURONTIN) 100 MG capsule Take 200 mg by mouth 3 (three) times daily.   . methotrexate (RHEUMATREX) 2.5 MG tablet Take 10 mg by mouth every Wednesday.  . methotrexate (RHEUMATREX) 2.5 MG tablet   . mupirocin ointment (BACTROBAN) 2 % Apply to wound twice a day.  . ondansetron (ZOFRAN) 8 MG tablet   . polyethylene glycol powder (GLYCOLAX/MIRALAX) powder   . predniSONE (DELTASONE) 5 MG tablet Take 5 mg by mouth daily.  Marland Kitchen SYNTHROID 100 MCG tablet Take 100 mcg by mouth  daily.  . traMADol (ULTRAM) 50 MG tablet Take one tablet by mouth twice daily for pains PRN (Patient taking differently: Take 50 mg by mouth every 8 (eight) hours as needed for moderate pain. )     Allergies  Allergen Reactions  . Penicillins Rash and Other (See Comments)    Has patient had a PCN reaction causing immediate rash, facial/tongue/throat swelling, SOB or lightheadedness with hypotension: Unknown Has patient had a PCN reaction causing severe rash involving mucus membranes or skin necrosis: Unknown Has patient had a PCN reaction that required hospitalization: Unknown Has patient had a PCN reaction occurring within the last 10 years: Unknown If all of the above answers are "NO", then may proceed with Cephalosporin use.     Social History   Socioeconomic History  . Marital status: Married    Spouse name: Not on file  . Number of children: Not on file  . Years of education: Not on file  . Highest education level: Not on file  Social Needs  . Financial resource strain: Not on file  . Food insecurity - worry: Not on file  . Food insecurity - inability: Not on file  . Transportation needs - medical: Not on file  . Transportation needs - non-medical: Not on file  Occupational History  . Not on file  Tobacco Use  . Smoking status: Former Smoker    Types: Cigarettes    Last attempt to  quit: 11/10/1970    Years since quitting: 46.9  . Smokeless tobacco: Never Used  Substance and Sexual Activity  . Alcohol use: No  . Drug use: No  . Sexual activity: No  Other Topics Concern  . Not on file  Social History Narrative  . Not on file     Review of Systems: General: negative for chills, fever, night sweats or weight changes.  Cardiovascular: negative for chest pain, dyspnea on exertion, edema, orthopnea, palpitations, paroxysmal nocturnal dyspnea or shortness of breath Dermatological: negative for rash Respiratory: negative for cough or wheezing Urologic: negative for  hematuria Abdominal: negative for nausea, vomiting, diarrhea, bright red blood per rectum, melena, or hematemesis Neurologic: negative for visual changes, syncope, or dizziness All other systems reviewed and are otherwise negative except as noted above.    Blood pressure 108/74, pulse 89.  General appearance: alert and no distress Neck: no adenopathy, no carotid bruit, no JVD, supple, symmetrical, trachea midline and thyroid not enlarged, symmetric, no tenderness/mass/nodules Lungs: clear to auscultation bilaterally Heart: Soft outflow tract murmur Extremities: Gangrenous ulcers on the left medial aspect of his first toe and lateral aspect of his foot as well Pulses: Diminished pedal pulses Skin: Skin color, texture, turgor normal. No rashes or lesions or Gangrenous changes on his left foot Neurologic: Alert and oriented X 3, normal strength and tone. Normal symmetric reflexes. Normal coordination and gait  EKG not performed today  ASSESSMENT AND PLAN:   Critical lower limb ischemia Barry Wright was referred to me by Dr. Jacqualyn Posey for evaluation of critical limb ischemia. He is a 83 year old frail and chronically ill-appearing married Caucasian male who lives in an adult facility called [facility] with his wife. He is a patient of Dr. Jeralene Peters Smith's. He does have a history of ischemic heart disease has had remote bypass surgery. He is wheelchair bound and nonambulatory and has Parkinson's as well. He developed gangrenous wounds on his foot sometime within the last month or 2. 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 Given his age and comorbidities I do not think he is a candidate for endovascular therapy recommended aggressive local wound care. I will see him back as needed.      Lorretta Harp MD FACP,FACC,FAHA, [facility] 09/29/2017 2:33 PM

## 2017-09-29 NOTE — Patient Instructions (Signed)
Medication Instructions: Your physician recommends that you continue on your current medications as directed. Please refer to the Current Medication list given to you today.   Follow-Up: Your physician recommends that you schedule a follow-up appointment as needed with Dr. Berry.    

## 2017-09-29 NOTE — Assessment & Plan Note (Signed)
Barry Wright was referred to me by Barry Wright for evaluation of critical limb ischemia. He is a 83 year old frail and chronically ill-appearing married Caucasian male who lives in an adult facility called [facility] with his wife. He is a patient of Barry Wright's. He does have a history of ischemic heart disease has had remote bypass surgery. He is wheelchair bound and nonambulatory and has Parkinson's as well. He developed gangrenous wounds on his foot sometime within the last month or 2. 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 Given his age and comorbidities I do not think he is a candidate for endovascular therapy recommended aggressive local wound care. I will see him back as needed.

## 2017-09-30 ENCOUNTER — Ambulatory Visit: Payer: Medicare Other | Admitting: Cardiovascular Disease

## 2017-09-30 DIAGNOSIS — S91105D Unspecified open wound of left lesser toe(s) without damage to nail, subsequent encounter: Secondary | ICD-10-CM | POA: Diagnosis not present

## 2017-09-30 DIAGNOSIS — M353 Polymyalgia rheumatica: Secondary | ICD-10-CM | POA: Diagnosis not present

## 2017-09-30 DIAGNOSIS — G20 Parkinson's disease: Secondary | ICD-10-CM | POA: Diagnosis not present

## 2017-09-30 DIAGNOSIS — S91302D Unspecified open wound, left foot, subsequent encounter: Secondary | ICD-10-CM | POA: Diagnosis not present

## 2017-09-30 DIAGNOSIS — N183 Chronic kidney disease, stage 3 (moderate): Secondary | ICD-10-CM | POA: Diagnosis not present

## 2017-09-30 DIAGNOSIS — E785 Hyperlipidemia, unspecified: Secondary | ICD-10-CM | POA: Diagnosis not present

## 2017-09-30 DIAGNOSIS — I2510 Atherosclerotic heart disease of native coronary artery without angina pectoris: Secondary | ICD-10-CM | POA: Diagnosis not present

## 2017-09-30 DIAGNOSIS — Z87891 Personal history of nicotine dependence: Secondary | ICD-10-CM | POA: Diagnosis not present

## 2017-09-30 DIAGNOSIS — E039 Hypothyroidism, unspecified: Secondary | ICD-10-CM | POA: Diagnosis not present

## 2017-09-30 DIAGNOSIS — Z7952 Long term (current) use of systemic steroids: Secondary | ICD-10-CM | POA: Diagnosis not present

## 2017-09-30 DIAGNOSIS — L89321 Pressure ulcer of left buttock, stage 1: Secondary | ICD-10-CM | POA: Diagnosis not present

## 2017-09-30 DIAGNOSIS — Z87440 Personal history of urinary (tract) infections: Secondary | ICD-10-CM | POA: Diagnosis not present

## 2017-09-30 DIAGNOSIS — Z940 Kidney transplant status: Secondary | ICD-10-CM | POA: Diagnosis not present

## 2017-09-30 DIAGNOSIS — Z951 Presence of aortocoronary bypass graft: Secondary | ICD-10-CM | POA: Diagnosis not present

## 2017-09-30 DIAGNOSIS — I509 Heart failure, unspecified: Secondary | ICD-10-CM | POA: Diagnosis not present

## 2017-10-05 DIAGNOSIS — G20 Parkinson's disease: Secondary | ICD-10-CM | POA: Diagnosis not present

## 2017-10-05 DIAGNOSIS — S91105D Unspecified open wound of left lesser toe(s) without damage to nail, subsequent encounter: Secondary | ICD-10-CM | POA: Diagnosis not present

## 2017-10-05 DIAGNOSIS — I509 Heart failure, unspecified: Secondary | ICD-10-CM | POA: Diagnosis not present

## 2017-10-05 DIAGNOSIS — L89321 Pressure ulcer of left buttock, stage 1: Secondary | ICD-10-CM | POA: Diagnosis not present

## 2017-10-05 DIAGNOSIS — I2510 Atherosclerotic heart disease of native coronary artery without angina pectoris: Secondary | ICD-10-CM | POA: Diagnosis not present

## 2017-10-05 DIAGNOSIS — S91302D Unspecified open wound, left foot, subsequent encounter: Secondary | ICD-10-CM | POA: Diagnosis not present

## 2017-10-08 ENCOUNTER — Ambulatory Visit (INDEPENDENT_AMBULATORY_CARE_PROVIDER_SITE_OTHER): Payer: Medicare Other | Admitting: Podiatry

## 2017-10-08 ENCOUNTER — Encounter: Payer: Self-pay | Admitting: Podiatry

## 2017-10-08 DIAGNOSIS — I96 Gangrene, not elsewhere classified: Secondary | ICD-10-CM

## 2017-10-08 DIAGNOSIS — I739 Peripheral vascular disease, unspecified: Secondary | ICD-10-CM | POA: Diagnosis not present

## 2017-10-08 DIAGNOSIS — L97529 Non-pressure chronic ulcer of other part of left foot with unspecified severity: Secondary | ICD-10-CM

## 2017-10-09 ENCOUNTER — Telehealth: Payer: Self-pay | Admitting: *Deleted

## 2017-10-09 DIAGNOSIS — S91302D Unspecified open wound, left foot, subsequent encounter: Secondary | ICD-10-CM | POA: Diagnosis not present

## 2017-10-09 DIAGNOSIS — I2510 Atherosclerotic heart disease of native coronary artery without angina pectoris: Secondary | ICD-10-CM | POA: Diagnosis not present

## 2017-10-09 DIAGNOSIS — S91105D Unspecified open wound of left lesser toe(s) without damage to nail, subsequent encounter: Secondary | ICD-10-CM | POA: Diagnosis not present

## 2017-10-09 DIAGNOSIS — G20 Parkinson's disease: Secondary | ICD-10-CM | POA: Diagnosis not present

## 2017-10-09 DIAGNOSIS — L89321 Pressure ulcer of left buttock, stage 1: Secondary | ICD-10-CM | POA: Diagnosis not present

## 2017-10-09 DIAGNOSIS — I509 Heart failure, unspecified: Secondary | ICD-10-CM | POA: Diagnosis not present

## 2017-10-09 NOTE — Progress Notes (Signed)
Subjective: 83 year old male presents the office with his caregiver for follow-up evaluation of a wound to the left foot.  They have been doing Betadine dressing changes daily.  He is currently not on any antibiotics and I confirmed this with his caregiver today.  He did follow with Dr. Ezzie Dural however given age and health conditions it was not felt to be safe to proceed with any intervention to help increase circulation.  His caregiver states they have not noticed any drainage or pus or any increase in redness or swelling.  He has no other concerns today no acute changes since last appointment. Denies any systemic complaints such as fevers, chills, nausea, vomiting. No acute changes since last appointment, and no other complaints at this time.   Objective: AAO x3, NAD; presents in a wheelchair DP/PT pulses decreased bilaterally The wound to the dorsal aspect of the left second toe appears to be healing well.  A large eschar to the medial first metatarsal head.  There is no areas of fluctuance or crepitus there is no malodor.  They appear to be hard in nature.  Mild swelling to the foot there is no surrounding erythema, ascending cellulitis.  See pictures below.  There is no pain with calf compression, swelling, warmth, erythema.            Assessment: Large wound left foot with PAD  Plan: -All treatment options discussed with the patient including all alternatives, risks, complications.  -At this point given the continuation of wounds I recommended a wound care referral to see if there is any advance wound care options.  Unable to do any kind of intervention to help increase circulation.  For now we will continue Betadine wet-to-dry dressings to keep area clean and dry.  Referral was placed for the wound care center today. Monitor for any clinical signs or symptoms of infection and directed to call the office immediately should any occur or go to the ER. - I will see him back in 2 weeks if unable  to get into the wound care center before that would respond to the wound care center we can cancel that appointment.  His caregiver understands. Patient also verbalized understanding   Trula Slade DPM

## 2017-10-09 NOTE — Telephone Encounter (Signed)
Faxed required form, clinicals and demographics [facility] 

## 2017-10-12 DIAGNOSIS — I509 Heart failure, unspecified: Secondary | ICD-10-CM | POA: Diagnosis not present

## 2017-10-12 DIAGNOSIS — I2510 Atherosclerotic heart disease of native coronary artery without angina pectoris: Secondary | ICD-10-CM | POA: Diagnosis not present

## 2017-10-12 DIAGNOSIS — S91105D Unspecified open wound of left lesser toe(s) without damage to nail, subsequent encounter: Secondary | ICD-10-CM | POA: Diagnosis not present

## 2017-10-12 DIAGNOSIS — G20 Parkinson's disease: Secondary | ICD-10-CM | POA: Diagnosis not present

## 2017-10-12 DIAGNOSIS — S91302D Unspecified open wound, left foot, subsequent encounter: Secondary | ICD-10-CM | POA: Diagnosis not present

## 2017-10-12 DIAGNOSIS — L89321 Pressure ulcer of left buttock, stage 1: Secondary | ICD-10-CM | POA: Diagnosis not present

## 2017-10-15 ENCOUNTER — Telehealth: Payer: Self-pay | Admitting: Podiatry

## 2017-10-15 DIAGNOSIS — I509 Heart failure, unspecified: Secondary | ICD-10-CM | POA: Diagnosis not present

## 2017-10-15 DIAGNOSIS — L89321 Pressure ulcer of left buttock, stage 1: Secondary | ICD-10-CM | POA: Diagnosis not present

## 2017-10-15 DIAGNOSIS — G20 Parkinson's disease: Secondary | ICD-10-CM | POA: Diagnosis not present

## 2017-10-15 DIAGNOSIS — I2510 Atherosclerotic heart disease of native coronary artery without angina pectoris: Secondary | ICD-10-CM | POA: Diagnosis not present

## 2017-10-15 DIAGNOSIS — S91302D Unspecified open wound, left foot, subsequent encounter: Secondary | ICD-10-CM | POA: Diagnosis not present

## 2017-10-15 DIAGNOSIS — S91105D Unspecified open wound of left lesser toe(s) without damage to nail, subsequent encounter: Secondary | ICD-10-CM | POA: Diagnosis not present

## 2017-10-15 NOTE — Telephone Encounter (Signed)
Dr Jacqualyn Posey, I told them to finish antibiotics if any were left to take and then to d/c the order. Did you want any further antibiotic treatment for him?

## 2017-10-15 NOTE — Telephone Encounter (Signed)
Finish what he has then can d/c.

## 2017-10-15 NOTE — Telephone Encounter (Signed)
This call is in regards to [employer]. If a nurse or Dr. Jacqualyn Posey could call us back at [phone] and ask to speak a med tech about his antibiotic for his foot. Thank you. Bye bye.

## 2017-10-21 DIAGNOSIS — I2510 Atherosclerotic heart disease of native coronary artery without angina pectoris: Secondary | ICD-10-CM | POA: Diagnosis not present

## 2017-10-21 DIAGNOSIS — L89321 Pressure ulcer of left buttock, stage 1: Secondary | ICD-10-CM | POA: Diagnosis not present

## 2017-10-21 DIAGNOSIS — S91302D Unspecified open wound, left foot, subsequent encounter: Secondary | ICD-10-CM | POA: Diagnosis not present

## 2017-10-21 DIAGNOSIS — G20 Parkinson's disease: Secondary | ICD-10-CM | POA: Diagnosis not present

## 2017-10-21 DIAGNOSIS — S91105D Unspecified open wound of left lesser toe(s) without damage to nail, subsequent encounter: Secondary | ICD-10-CM | POA: Diagnosis not present

## 2017-10-21 DIAGNOSIS — I509 Heart failure, unspecified: Secondary | ICD-10-CM | POA: Diagnosis not present

## 2017-10-22 ENCOUNTER — Ambulatory Visit (INDEPENDENT_AMBULATORY_CARE_PROVIDER_SITE_OTHER): Payer: Medicare Other | Admitting: Podiatry

## 2017-10-22 DIAGNOSIS — I96 Gangrene, not elsewhere classified: Secondary | ICD-10-CM | POA: Diagnosis not present

## 2017-10-22 DIAGNOSIS — I739 Peripheral vascular disease, unspecified: Secondary | ICD-10-CM

## 2017-10-27 DIAGNOSIS — L89321 Pressure ulcer of left buttock, stage 1: Secondary | ICD-10-CM | POA: Diagnosis not present

## 2017-10-27 DIAGNOSIS — S91105D Unspecified open wound of left lesser toe(s) without damage to nail, subsequent encounter: Secondary | ICD-10-CM | POA: Diagnosis not present

## 2017-10-27 DIAGNOSIS — G20 Parkinson's disease: Secondary | ICD-10-CM | POA: Diagnosis not present

## 2017-10-27 DIAGNOSIS — S91302D Unspecified open wound, left foot, subsequent encounter: Secondary | ICD-10-CM | POA: Diagnosis not present

## 2017-10-27 DIAGNOSIS — I2510 Atherosclerotic heart disease of native coronary artery without angina pectoris: Secondary | ICD-10-CM | POA: Diagnosis not present

## 2017-10-27 DIAGNOSIS — I509 Heart failure, unspecified: Secondary | ICD-10-CM | POA: Diagnosis not present

## 2017-10-27 NOTE — Progress Notes (Signed)
Subjective: 83 year old male presents the office with his caregiver for follow-up evaluation of a wound/gangrene to the left foot.  He did not get into the wound care center yet they have not called.  His facility has been changing the bandages daily with Betadine wet-to-dry.  He denies any drainage or pus he denies any increase in swelling to his feet.  He presents today with his caregiver who has no new concerns.  He presents today for follow-up evaluation. Denies any systemic complaints such as fevers, chills, nausea, vomiting. No acute changes since last appointment, and no other complaints at this time.   Objective: AAO x3, NAD; presents in a wheelchair DP/PT pulses decreased bilaterally The wound to the dorsal aspect of the left second toe appears to be healing well.  There is thick hyperkeratotic tissue.  Upon debridement the underlying area appears to be almost healed at this time.  There is no drainage or pus there is no probing to bone, undermining or tunneling.  There is gangrenous changes present to the medial aspect of the foot as well as to the fifth toe and lateral foot.  It does not look like that area is starting to peel off and heal.  There is no fluctuation or crepitation.  There is no malodor. There is no drainage or pus there is no clinical signs of infection noted.  There is no pain with calf compression, swelling, warmth, erythema.          Assessment: Large wound with gangrene left foot with PAD  Plan: -All treatment options discussed with the patient including all alternatives, risks, complications.  -There is no clinical signs of infection so we will hold off any further oral antibiotics.  It does appear that the eschars are starting to peel off but I did not debride this today.  Continue Betadine wet-to-dry dressings for now. Monitor for any clinical signs or symptoms of infection and directed to call the office immediately should any occur or go to the  ER. -Follow-up as scheduled or sooner if needed.  I would like to see him back regularly to ensure healing and no signs of infection.  He agrees this plan and has no further questions   Trula Slade DPM

## 2017-11-02 DIAGNOSIS — I509 Heart failure, unspecified: Secondary | ICD-10-CM | POA: Diagnosis not present

## 2017-11-02 DIAGNOSIS — L89321 Pressure ulcer of left buttock, stage 1: Secondary | ICD-10-CM | POA: Diagnosis not present

## 2017-11-02 DIAGNOSIS — S91105D Unspecified open wound of left lesser toe(s) without damage to nail, subsequent encounter: Secondary | ICD-10-CM | POA: Diagnosis not present

## 2017-11-02 DIAGNOSIS — G20 Parkinson's disease: Secondary | ICD-10-CM | POA: Diagnosis not present

## 2017-11-02 DIAGNOSIS — S91302D Unspecified open wound, left foot, subsequent encounter: Secondary | ICD-10-CM | POA: Diagnosis not present

## 2017-11-02 DIAGNOSIS — I2510 Atherosclerotic heart disease of native coronary artery without angina pectoris: Secondary | ICD-10-CM | POA: Diagnosis not present

## 2017-11-12 ENCOUNTER — Ambulatory Visit: Payer: Medicare Other | Admitting: Podiatry

## 2017-11-13 DIAGNOSIS — I509 Heart failure, unspecified: Secondary | ICD-10-CM | POA: Diagnosis not present

## 2017-11-13 DIAGNOSIS — G20 Parkinson's disease: Secondary | ICD-10-CM | POA: Diagnosis not present

## 2017-11-13 DIAGNOSIS — S91105D Unspecified open wound of left lesser toe(s) without damage to nail, subsequent encounter: Secondary | ICD-10-CM | POA: Diagnosis not present

## 2017-11-13 DIAGNOSIS — S91302D Unspecified open wound, left foot, subsequent encounter: Secondary | ICD-10-CM | POA: Diagnosis not present

## 2017-11-13 DIAGNOSIS — L89321 Pressure ulcer of left buttock, stage 1: Secondary | ICD-10-CM | POA: Diagnosis not present

## 2017-11-13 DIAGNOSIS — I2510 Atherosclerotic heart disease of native coronary artery without angina pectoris: Secondary | ICD-10-CM | POA: Diagnosis not present

## 2017-11-17 DIAGNOSIS — I2510 Atherosclerotic heart disease of native coronary artery without angina pectoris: Secondary | ICD-10-CM | POA: Diagnosis not present

## 2017-11-17 DIAGNOSIS — S91105D Unspecified open wound of left lesser toe(s) without damage to nail, subsequent encounter: Secondary | ICD-10-CM | POA: Diagnosis not present

## 2017-11-17 DIAGNOSIS — S91302D Unspecified open wound, left foot, subsequent encounter: Secondary | ICD-10-CM | POA: Diagnosis not present

## 2017-11-17 DIAGNOSIS — I509 Heart failure, unspecified: Secondary | ICD-10-CM | POA: Diagnosis not present

## 2017-11-17 DIAGNOSIS — L89321 Pressure ulcer of left buttock, stage 1: Secondary | ICD-10-CM | POA: Diagnosis not present

## 2017-11-17 DIAGNOSIS — G20 Parkinson's disease: Secondary | ICD-10-CM | POA: Diagnosis not present

## 2017-11-23 DIAGNOSIS — G20 Parkinson's disease: Secondary | ICD-10-CM | POA: Diagnosis not present

## 2017-11-23 DIAGNOSIS — S91105D Unspecified open wound of left lesser toe(s) without damage to nail, subsequent encounter: Secondary | ICD-10-CM | POA: Diagnosis not present

## 2017-11-23 DIAGNOSIS — I509 Heart failure, unspecified: Secondary | ICD-10-CM | POA: Diagnosis not present

## 2017-11-23 DIAGNOSIS — L89321 Pressure ulcer of left buttock, stage 1: Secondary | ICD-10-CM | POA: Diagnosis not present

## 2017-11-23 DIAGNOSIS — I2510 Atherosclerotic heart disease of native coronary artery without angina pectoris: Secondary | ICD-10-CM | POA: Diagnosis not present

## 2017-11-23 DIAGNOSIS — S91302D Unspecified open wound, left foot, subsequent encounter: Secondary | ICD-10-CM | POA: Diagnosis not present

## 2017-11-24 ENCOUNTER — Ambulatory Visit (INDEPENDENT_AMBULATORY_CARE_PROVIDER_SITE_OTHER): Payer: Medicare Other | Admitting: Podiatry

## 2017-11-24 DIAGNOSIS — I96 Gangrene, not elsewhere classified: Secondary | ICD-10-CM

## 2017-11-24 DIAGNOSIS — B351 Tinea unguium: Secondary | ICD-10-CM

## 2017-11-24 DIAGNOSIS — M79609 Pain in unspecified limb: Secondary | ICD-10-CM

## 2017-11-24 DIAGNOSIS — I739 Peripheral vascular disease, unspecified: Secondary | ICD-10-CM

## 2017-11-24 MED ORDER — DOXYCYCLINE HYCLATE 100 MG PO TABS: 100.0000 mg | ORAL_TABLET | Freq: Two times a day (BID) | ORAL | 0 refills | Status: AC

## 2017-11-24 NOTE — Progress Notes (Signed)
Subjective: 84 year old male presents the office with his caregiver for follow-up evaluation of a wound/gangrene to the left foot.  His caregiver states that he is discharged from home health care yesterday but his caregiver has been doing the dressing changes as well.  They have been putting small amount of Betadine to the wounds daily.  Has noticed a small amount of clear drainage but denies any pus.  No increase in redness or swelling to his foot.  He is asking for his nails to be trimmed today as they are thick, discolored and causing irritation set issues.  He has no other concerns today.  He presents today in a wheelchair. Denies any systemic complaints such as fevers, chills, nausea, vomiting. No acute changes since last appointment, and no other complaints at this time.   Objective: NAD; presents in a wheelchair with caregiver DP/PT pulses decreased bilaterally The wound to the dorsal aspect of the left second toe appears to be healing well however there is hyperkeratotic tissue overlying the area.  There is a small superficial wound underneath the area but there is no probing to bone, drainage or pus or any signs of infection noted otherwise. Discharge present to the medial aspect of the first metatarsal head as well as the distal fourth toe as well as the entire fifth digit extending to the plantar fifth MPJ.  The eschar is starting to loosen up and there is some clear drainage around the area.  I was able to debride some of the loose tissue today and there is a viable granular wound present underneath the eschar.  There is no fluctuation or crepitation.  There is no malodor.  No purulence. Nails are hypertrophic, dystrophic, discolored 1-5 on the right side and 1-3 on the left.  Subjective the nails are tender.  No redness or drainage or swelling. No other open lesions or pre-ulcerative lesions identified today. There is no pain with calf compression, swelling, warmth,  erythema.  Assessment: Large wound with gangrene left foot with PAD; symptomatic onychomycosis  Plan: -All treatment options discussed with the patient including all alternatives, risks, complications.  -Nails sharply debrided x8 without any complications or bleeding -I was able to loosely debride some of the eschar today without any complications.  There is a small amount of clear drainage which is new therefore we are going to start doxycycline in case of infection.  Continue with Betadine dressings to the wound daily. -They did not hear back from the wound care center and the patient does not want to go anyway.  -Monitor for any clinical signs or symptoms of infection and directed to call the office immediately should any occur or go to the ER. -Follow-up in 2 weeks or sooner if any issues are to arise.  Trula Slade DPM

## 2017-11-26 ENCOUNTER — Other Ambulatory Visit: Payer: Self-pay

## 2017-11-26 ENCOUNTER — Emergency Department (HOSPITAL_COMMUNITY): Payer: Medicare Other

## 2017-11-26 ENCOUNTER — Inpatient Hospital Stay (HOSPITAL_COMMUNITY)
Admission: EM | Admit: 2017-11-26 | Discharge: 2017-12-02 | DRG: 871 | Disposition: A | Discharge status: Home / Self Care | Payer: Medicare Other | Attending: Family Medicine | Admitting: Family Medicine

## 2017-11-26 DIAGNOSIS — E860 Dehydration: Secondary | ICD-10-CM | POA: Diagnosis present

## 2017-11-26 DIAGNOSIS — R410 Disorientation, unspecified: Secondary | ICD-10-CM | POA: Diagnosis not present

## 2017-11-26 DIAGNOSIS — Z515 Encounter for palliative care: Secondary | ICD-10-CM

## 2017-11-26 DIAGNOSIS — Z7901 Long term (current) use of anticoagulants: Secondary | ICD-10-CM

## 2017-11-26 DIAGNOSIS — I6522 Occlusion and stenosis of left carotid artery: Secondary | ICD-10-CM | POA: Diagnosis not present

## 2017-11-26 DIAGNOSIS — Z79899 Other long term (current) drug therapy: Secondary | ICD-10-CM

## 2017-11-26 DIAGNOSIS — I2510 Atherosclerotic heart disease of native coronary artery without angina pectoris: Secondary | ICD-10-CM | POA: Diagnosis not present

## 2017-11-26 DIAGNOSIS — Z89422 Acquired absence of other left toe(s): Secondary | ICD-10-CM | POA: Diagnosis not present

## 2017-11-26 DIAGNOSIS — G9341 Metabolic encephalopathy: Secondary | ICD-10-CM | POA: Diagnosis present

## 2017-11-26 DIAGNOSIS — Z7189 Other specified counseling: Secondary | ICD-10-CM | POA: Diagnosis not present

## 2017-11-26 DIAGNOSIS — G629 Polyneuropathy, unspecified: Secondary | ICD-10-CM | POA: Diagnosis present

## 2017-11-26 DIAGNOSIS — R5381 Other malaise: Secondary | ICD-10-CM | POA: Diagnosis not present

## 2017-11-26 DIAGNOSIS — A419 Sepsis, unspecified organism: Secondary | ICD-10-CM | POA: Diagnosis present

## 2017-11-26 DIAGNOSIS — I96 Gangrene, not elsewhere classified: Secondary | ICD-10-CM

## 2017-11-26 DIAGNOSIS — Z7952 Long term (current) use of systemic steroids: Secondary | ICD-10-CM

## 2017-11-26 DIAGNOSIS — E861 Hypovolemia: Secondary | ICD-10-CM | POA: Diagnosis present

## 2017-11-26 DIAGNOSIS — E039 Hypothyroidism, unspecified: Secondary | ICD-10-CM | POA: Diagnosis not present

## 2017-11-26 DIAGNOSIS — I779 Disorder of arteries and arterioles, unspecified: Secondary | ICD-10-CM | POA: Diagnosis present

## 2017-11-26 DIAGNOSIS — I440 Atrioventricular block, first degree: Secondary | ICD-10-CM | POA: Diagnosis present

## 2017-11-26 DIAGNOSIS — R600 Localized edema: Secondary | ICD-10-CM | POA: Diagnosis not present

## 2017-11-26 DIAGNOSIS — Z993 Dependence on wheelchair: Secondary | ICD-10-CM

## 2017-11-26 DIAGNOSIS — R509 Fever, unspecified: Secondary | ICD-10-CM | POA: Diagnosis not present

## 2017-11-26 DIAGNOSIS — I5032 Chronic diastolic (congestive) heart failure: Secondary | ICD-10-CM | POA: Diagnosis present

## 2017-11-26 DIAGNOSIS — Z880 Allergy status to penicillin: Secondary | ICD-10-CM

## 2017-11-26 DIAGNOSIS — H9190 Unspecified hearing loss, unspecified ear: Secondary | ICD-10-CM | POA: Diagnosis present

## 2017-11-26 DIAGNOSIS — Z905 Acquired absence of kidney: Secondary | ICD-10-CM

## 2017-11-26 DIAGNOSIS — M353 Polymyalgia rheumatica: Secondary | ICD-10-CM | POA: Diagnosis not present

## 2017-11-26 DIAGNOSIS — G20 Parkinson's disease: Secondary | ICD-10-CM | POA: Diagnosis present

## 2017-11-26 DIAGNOSIS — I130 Hypertensive heart and chronic kidney disease with heart failure and stage 1 through stage 4 chronic kidney disease, or unspecified chronic kidney disease: Secondary | ICD-10-CM | POA: Diagnosis present

## 2017-11-26 DIAGNOSIS — G20A1 Parkinson's disease without dyskinesia, without mention of fluctuations: Secondary | ICD-10-CM

## 2017-11-26 DIAGNOSIS — Z8673 Personal history of transient ischemic attack (TIA), and cerebral infarction without residual deficits: Secondary | ICD-10-CM | POA: Diagnosis not present

## 2017-11-26 DIAGNOSIS — I739 Peripheral vascular disease, unspecified: Secondary | ICD-10-CM

## 2017-11-26 DIAGNOSIS — F05 Delirium due to known physiological condition: Secondary | ICD-10-CM | POA: Diagnosis present

## 2017-11-26 DIAGNOSIS — N179 Acute kidney failure, unspecified: Secondary | ICD-10-CM | POA: Diagnosis present

## 2017-11-26 DIAGNOSIS — Z66 Do not resuscitate: Secondary | ICD-10-CM | POA: Diagnosis present

## 2017-11-26 DIAGNOSIS — I252 Old myocardial infarction: Secondary | ICD-10-CM

## 2017-11-26 DIAGNOSIS — Z951 Presence of aortocoronary bypass graft: Secondary | ICD-10-CM | POA: Diagnosis not present

## 2017-11-26 DIAGNOSIS — N183 Chronic kidney disease, stage 3 unspecified: Secondary | ICD-10-CM | POA: Diagnosis present

## 2017-11-26 DIAGNOSIS — D638 Anemia in other chronic diseases classified elsewhere: Secondary | ICD-10-CM | POA: Diagnosis present

## 2017-11-26 DIAGNOSIS — E876 Hypokalemia: Secondary | ICD-10-CM

## 2017-11-26 DIAGNOSIS — M868X7 Other osteomyelitis, ankle and foot: Secondary | ICD-10-CM | POA: Diagnosis present

## 2017-11-26 DIAGNOSIS — I10 Essential (primary) hypertension: Secondary | ICD-10-CM | POA: Diagnosis not present

## 2017-11-26 DIAGNOSIS — R4182 Altered mental status, unspecified: Secondary | ICD-10-CM | POA: Diagnosis not present

## 2017-11-26 DIAGNOSIS — E7800 Pure hypercholesterolemia, unspecified: Secondary | ICD-10-CM | POA: Diagnosis not present

## 2017-11-26 DIAGNOSIS — F0390 Unspecified dementia without behavioral disturbance: Secondary | ICD-10-CM | POA: Diagnosis present

## 2017-11-26 DIAGNOSIS — Z87891 Personal history of nicotine dependence: Secondary | ICD-10-CM | POA: Diagnosis not present

## 2017-11-26 DIAGNOSIS — I5042 Chronic combined systolic (congestive) and diastolic (congestive) heart failure: Secondary | ICD-10-CM | POA: Diagnosis present

## 2017-11-26 DIAGNOSIS — I4510 Unspecified right bundle-branch block: Secondary | ICD-10-CM | POA: Diagnosis present

## 2017-11-26 DIAGNOSIS — R531 Weakness: Secondary | ICD-10-CM | POA: Diagnosis not present

## 2017-11-26 DIAGNOSIS — R404 Transient alteration of awareness: Secondary | ICD-10-CM | POA: Diagnosis not present

## 2017-11-26 DIAGNOSIS — R6520 Severe sepsis without septic shock: Secondary | ICD-10-CM | POA: Diagnosis not present

## 2017-11-26 DIAGNOSIS — R0902 Hypoxemia: Secondary | ICD-10-CM | POA: Diagnosis not present

## 2017-11-26 LAB — URINALYSIS, ROUTINE W REFLEX MICROSCOPIC
Bilirubin Urine: NEGATIVE
Glucose, UA: NEGATIVE mg/dL
Ketones, ur: NEGATIVE mg/dL
LEUKOCYTES UA: NEGATIVE
NITRITE: NEGATIVE
PROTEIN: NEGATIVE mg/dL
pH: 5 (ref 5.0–8.0)

## 2017-11-26 LAB — COMPREHENSIVE METABOLIC PANEL
ALBUMIN: 2.9 g/dL — AB (ref 3.5–5.0)
ALT: 13 U/L — ABNORMAL LOW (ref 17–63)
ANION GAP: 12 (ref 5–15)
AST: 25 U/L (ref 15–41)
Alkaline Phosphatase: 66 U/L (ref 38–126)
BILIRUBIN TOTAL: 1.1 mg/dL (ref 0.3–1.2)
BUN: 32 mg/dL — ABNORMAL HIGH (ref 6–20)
CO2: 26 mmol/L (ref 22–32)
Calcium: 9.2 mg/dL (ref 8.9–10.3)
Chloride: 105 mmol/L (ref 101–111)
Creatinine, Ser: 1.78 mg/dL — ABNORMAL HIGH (ref 0.61–1.24)
GFR calc Af Amer: 36 mL/min — ABNORMAL LOW (ref >60)
GFR calc non Af Amer: 31 mL/min — ABNORMAL LOW (ref >60)
GLUCOSE: 122 mg/dL — AB (ref 65–99)
POTASSIUM: 3 mmol/L — AB (ref 3.5–5.1)
Sodium: 143 mmol/L (ref 135–145)
TOTAL PROTEIN: 5.5 g/dL — AB (ref 6.5–8.1)

## 2017-11-26 LAB — CREATININE, SERUM
CREATININE: 1.72 mg/dL — AB (ref 0.61–1.24)
GFR, EST AFRICAN AMERICAN: 37 mL/min — AB (ref >60)
GFR, EST NON AFRICAN AMERICAN: 32 mL/min — AB (ref >60)

## 2017-11-26 LAB — CBC WITH DIFFERENTIAL/PLATELET
BASOS ABS: 0 10*3/uL (ref 0.0–0.1)
BASOS PCT: 0 %
Eosinophils Absolute: 0.1 10*3/uL (ref 0.0–0.7)
Eosinophils Relative: 0 %
HEMATOCRIT: 34 % — AB (ref 39.0–52.0)
HEMOGLOBIN: 11.1 g/dL — AB (ref 13.0–17.0)
Lymphocytes Relative: 14 %
Lymphs Abs: 2.9 10*3/uL (ref 0.7–4.0)
MCH: 34.5 pg — ABNORMAL HIGH (ref 26.0–34.0)
MCHC: 32.6 g/dL (ref 30.0–36.0)
MCV: 105.6 fL — ABNORMAL HIGH (ref 78.0–100.0)
MONO ABS: 0.9 10*3/uL (ref 0.1–1.0)
Monocytes Relative: 4 %
NEUTROS ABS: 16.7 10*3/uL — AB (ref 1.7–7.7)
Neutrophils Relative %: 82 %
Platelets: 138 10*3/uL — ABNORMAL LOW (ref 150–400)
RBC: 3.22 MIL/uL — ABNORMAL LOW (ref 4.22–5.81)
RDW: 15.7 % — AB (ref 11.5–15.5)
WBC: 20.5 10*3/uL — ABNORMAL HIGH (ref 4.0–10.5)

## 2017-11-26 LAB — CBC
HEMATOCRIT: 31.4 % — AB (ref 39.0–52.0)
HEMOGLOBIN: 10.3 g/dL — AB (ref 13.0–17.0)
MCH: 34.9 pg — ABNORMAL HIGH (ref 26.0–34.0)
MCHC: 32.8 g/dL (ref 30.0–36.0)
MCV: 106.4 fL — ABNORMAL HIGH (ref 78.0–100.0)
Platelets: 123 10*3/uL — ABNORMAL LOW (ref 150–400)
RBC: 2.95 MIL/uL — ABNORMAL LOW (ref 4.22–5.81)
RDW: 16.1 % — AB (ref 11.5–15.5)
WBC: 17.8 10*3/uL — AB (ref 4.0–10.5)

## 2017-11-26 LAB — PROCALCITONIN: PROCALCITONIN: 2.49 ng/mL

## 2017-11-26 LAB — URINALYSIS, MICROSCOPIC (REFLEX): SQUAMOUS EPITHELIAL / LPF: NONE SEEN

## 2017-11-26 LAB — INFLUENZA PANEL BY PCR (TYPE A & B)
INFLBPCR: NEGATIVE
Influenza A By PCR: NEGATIVE

## 2017-11-26 LAB — LACTIC ACID, PLASMA: Lactic Acid, Venous: 1.8 mmol/L (ref 0.5–1.9)

## 2017-11-26 LAB — PROTIME-INR
INR: 1.24
PROTHROMBIN TIME: 15.5 s — AB (ref 11.4–15.2)

## 2017-11-26 LAB — CBG MONITORING, ED: GLUCOSE-CAPILLARY: 118 mg/dL — AB (ref 65–99)

## 2017-11-26 LAB — APTT: APTT: 33 s (ref 24–36)

## 2017-11-26 LAB — I-STAT CG4 LACTIC ACID, ED: Lactic Acid, Venous: 1.64 mmol/L (ref 0.5–1.9)

## 2017-11-26 MED ORDER — VANCOMYCIN HCL IN DEXTROSE 1-5 GM/200ML-% IV SOLN
1000.0000 mg | Freq: Once | INTRAVENOUS | Status: AC
  Administered 2017-11-26: 1000 mg via INTRAVENOUS
  Filled 2017-11-26: qty 200

## 2017-11-26 MED ORDER — CEFEPIME HCL 1 G IJ SOLR: 1.0000 g | Freq: Every day | INTRAMUSCULAR | Status: DC

## 2017-11-26 MED ORDER — HYDROCORTISONE NA SUCCINATE PF 100 MG IJ SOLR
50.0000 mg | Freq: Four times a day (QID) | INTRAMUSCULAR | Status: DC
  Administered 2017-11-26 – 2017-11-30 (×16): 50 mg via INTRAVENOUS
  Filled 2017-11-26 (×14): qty 1
  Filled 2017-11-26: qty 2
  Filled 2017-11-26 (×3): qty 1

## 2017-11-26 MED ORDER — POTASSIUM CHLORIDE IN NACL 20-0.9 MEQ/L-% IV SOLN
INTRAVENOUS | Status: DC
  Administered 2017-11-26 – 2017-12-01 (×5): via INTRAVENOUS
  Filled 2017-11-26 (×5): qty 1000

## 2017-11-26 MED ORDER — ENOXAPARIN SODIUM 30 MG/0.3ML ~~LOC~~ SOLN
30.0000 mg | SUBCUTANEOUS | Status: DC
  Filled 2017-11-26: qty 0.3

## 2017-11-26 MED ORDER — LEVOFLOXACIN IN D5W 500 MG/100ML IV SOLN: 500.0000 mg | INTRAVENOUS | Status: DC

## 2017-11-26 MED ORDER — SODIUM CHLORIDE 0.9 % IV BOLUS (SEPSIS)
250.0000 mL | Freq: Once | INTRAVENOUS | Status: AC
  Administered 2017-11-26: 250 mL via INTRAVENOUS

## 2017-11-26 MED ORDER — VANCOMYCIN HCL IN DEXTROSE 1-5 GM/200ML-% IV SOLN: 1000.0000 mg | Freq: Once | INTRAVENOUS | Status: DC

## 2017-11-26 MED ORDER — ACETAMINOPHEN 325 MG PO TABS
650.0000 mg | ORAL_TABLET | Freq: Four times a day (QID) | ORAL | Status: DC | PRN
  Administered 2017-11-27 – 2017-12-01 (×3): 650 mg via ORAL
  Filled 2017-11-26 (×4): qty 2

## 2017-11-26 MED ORDER — LEVOFLOXACIN IN D5W 750 MG/150ML IV SOLN
750.0000 mg | Freq: Once | INTRAVENOUS | Status: DC
  Filled 2017-11-26: qty 150

## 2017-11-26 MED ORDER — DEXTROSE 5 % IV SOLN: 2.0000 g | Freq: Once | INTRAVENOUS | Status: DC

## 2017-11-26 MED ORDER — SODIUM CHLORIDE 0.9 % IV BOLUS (SEPSIS)
1500.0000 mL | Freq: Once | INTRAVENOUS | Status: AC
  Administered 2017-11-26: 1500 mL via INTRAVENOUS

## 2017-11-26 MED ORDER — VANCOMYCIN HCL IN DEXTROSE 1-5 GM/200ML-% IV SOLN
1000.0000 mg | Freq: Once | INTRAVENOUS | Status: AC
  Administered 2017-11-27: 1000 mg via INTRAVENOUS
  Filled 2017-11-26: qty 200

## 2017-11-26 MED ORDER — ACETAMINOPHEN 650 MG RE SUPP: 650.0000 mg | Freq: Four times a day (QID) | RECTAL | Status: DC | PRN

## 2017-11-26 MED ORDER — SODIUM CHLORIDE 0.9 % IV BOLUS (SEPSIS)
500.0000 mL | Freq: Once | INTRAVENOUS | Status: AC
  Administered 2017-11-26: 500 mL via INTRAVENOUS

## 2017-11-26 MED ORDER — DEXTROSE 5 % IV SOLN
2.0000 g | Freq: Once | INTRAVENOUS | Status: AC
  Administered 2017-11-26: 2 g via INTRAVENOUS
  Filled 2017-11-26: qty 2

## 2017-11-26 MED ORDER — DEXTROSE 5 % IV SOLN
1.0000 g | Freq: Every day | INTRAVENOUS | Status: DC
  Administered 2017-11-27: 1 g via INTRAVENOUS
  Filled 2017-11-26: qty 1

## 2017-11-26 MED ORDER — VANCOMYCIN HCL IN DEXTROSE 1-5 GM/200ML-% IV SOLN: 1000.0000 mg | Freq: Every day | INTRAVENOUS | Status: DC

## 2017-11-26 MED ORDER — BISACODYL 5 MG PO TBEC
5.0000 mg | DELAYED_RELEASE_TABLET | Freq: Every day | ORAL | Status: DC | PRN
  Administered 2017-12-02: 5 mg via ORAL
  Filled 2017-11-26: qty 1

## 2017-11-26 NOTE — Progress Notes (Signed)
Able to reach pt's daughter, Barry Wright, by phone. She is going to talk to her sister in regards to her father's care and how aggressive they would like to be. She states that her ex-husband, Barry Wright, is actually more involved in their care and we can also contact him if needed. Pt's wife may have some dementia and would need assistance with decision making. She would like to be notified if pt should require pressors to make final decision.   Neoma Laming [phone] Barry Wright [phone]  Patrici Ranks, NP-C [place]  pgr [phone]  **Addendem: pt's wife at bedside along with ex-son in law. She is adamant that we aggressively treat her husband. I have discussed with her the severity of his illness and poor long-term prognosis. She is quite hard of hearing. If pt should require pressors, I would readdress the issue with her tough it doesn't seem she will be changing her mind. She appears to have capacity for decision making. There also appears to be a great deal of tension between the daughter (discussed above) and pt's wife   Patrici Ranks, NP-C [place]  pgr [phone]

## 2017-11-26 NOTE — H&P (Signed)
History and Physical    DREVON PLOG JXB:[id number] DOB: [birth year] DOA: 11/26/2017  PCP: Lajean Manes, MD            Podiatry: Jacqualyn Posey           Cardiology: Gwenlyn Found Consultants:  Wound care Patient coming from: [facility] SN[facility]  Chief Complaint: fever, hypotension  HPI: Barry Wright is a 84 y.o. male with medical history significant of gangrene/wound to left foot followed by podiatry, Parkinson's disease, CVA, PMR, CAD c hx CABG, LBBB, stage III CKD, hypothyroidism, hypercholesterolemia hx nephrectomy. Pt unable to give hx given acute illness. I am uncertain of baseline mentation. Pt presents from SNF with reported fever 102 and hypotension with mention of hypoxia by ems though non in ED on room air. Pt seen by podiatry 2 days ago for chronic wound to left foot/gangrene at which time nails and wound were debrided and pt put on po doxycyline for new finding of clear drainage from wound. According to that note, pt was verbal during that visit and wheelchair bound. Unable to obtain ROS as pt acutely ill, nonverbal    ED Course: Pt sent from [facility] SNF due to fever 102, BP 80/60 on EMS arrival improved with 250cc bolus. Pt put on doxycycline 2 days ago by podiatrist, Dr. Jacqualyn Posey, for left foot wound/gangrene with some clear drainage which was new. EMS reports O2 sat 84% enroute to ED though 98% on arrival to ED on room air. Temp 101.7 in ED, WBC 20.5, Creatinine 1.78 from 1.07 in 05/2017, lactic acid 1.64. Chest xray unremarkable, urinalysis pending on admission call  Review of Systems: As per HPI; otherwise review of systems reviewed and negative.   Ambulatory Status: bed bound  Past Medical History:  Diagnosis Date  . CAD (coronary artery disease)   . CHF (congestive heart failure) ([place])   . Chronic back pain   . Chronic kidney disease (CKD), stage III (moderate) (HCC)   . History of nephrectomy   . Hx of CABG   . Hypercholesteremia   . Hypothyroidism   . Internal  carotid artery stenosis   . Left bundle branch block   . Myocardial infarction ([place])   . Polymyalgia rheumatica ([place])   . Stroke [facility])     Past Surgical History:  Procedure Laterality Date  . CHOLECYSTECTOMY    . CORONARY ARTERY BYPASS GRAFT    . KIDNEY SURGERY     kidney removed  . NEPHRECTOMY      Social History   Socioeconomic History  . Marital status: Married    Spouse name: Not on file  . Number of children: Not on file  . Years of education: Not on file  . Highest education level: Not on file  Social Needs  . Financial resource strain: Not on file  . Food insecurity - worry: Not on file  . Food insecurity - inability: Not on file  . Transportation needs - medical: Not on file  . Transportation needs - non-medical: Not on file  Occupational History  . Not on file  Tobacco Use  . Smoking status: Former Smoker    Types: Cigarettes    Last attempt to quit: 11/10/1970    Years since quitting: 47.0  . Smokeless tobacco: Never Used  Substance and Sexual Activity  . Alcohol use: No  . Drug use: No  . Sexual activity: No  Other Topics Concern  . Not on file  Social History Narrative  . Not on file  Allergies  Allergen Reactions  . Penicillins Rash and Other (See Comments)    Has patient had a PCN reaction causing immediate rash, facial/tongue/throat swelling, SOB or lightheadedness with hypotension: Unknown Has patient had a PCN reaction causing severe rash involving mucus membranes or skin necrosis: Unknown Has patient had a PCN reaction that required hospitalization: Unknown Has patient had a PCN reaction occurring within the last 10 years: Unknown If all of the above answers are "NO", then may proceed with Cephalosporin use.     Family History  Problem Relation Age of Onset  . Other Mother        [place] Flu  . Heart disease Father     Prior to Admission medications   Medication Sig Start Date End Date Taking? Authorizing Provider  acetaminophen  (TYLENOL) 500 MG tablet Take 1,000 mg by mouth 2 (two) times daily.    Yes [provider]  Calcium Carb-Cholecalciferol (CALCIUM-VITAMIN D) 500-200 MG-UNIT tablet Take 1 tablet by mouth daily.   Yes [provider]  dextromethorphan (DELSYM COUGH CHILDRENS) 30 MG/5ML liquid Take 30 mg by mouth every 12 (twelve) hours as needed for cough.   Yes [provider]  doxycycline (VIBRAMYCIN) 100 MG capsule Take 1 capsule (100 mg total) by mouth 2 (two) times daily. 09/03/17  Yes Hyatt, Max T, DPM  ELIQUIS 2.5 MG TABS tablet Take 2.5 mg by mouth 2 (two) times daily.  09/14/17  Yes [provider]  feeding supplement, ENSURE ENLIVE, (ENSURE ENLIVE) LIQD Take 237 mLs by mouth 2 (two) times daily between meals. 05/12/16  Yes Jani Gravel, MD  FOLIC ACID PO Take 1 mg by mouth daily.    Yes [provider]  furosemide (LASIX) 20 MG tablet Take 0.5 tablets (10 mg total) by mouth daily. Resume on Saturday 01/31/2017. Patient taking differently: Take 20 mg by mouth daily. Resume on Saturday 01/31/2017. 01/31/17  Yes Eugenie Filler, MD  gabapentin (NEURONTIN) 100 MG capsule Take 200 mg by mouth 3 (three) times daily.    Yes [provider]  Multiple Vitamins-Minerals (MULTIVITAMIN WITH MINERALS) tablet Take 1 tablet by mouth daily.   Yes [provider]  predniSONE (DELTASONE) 5 MG tablet Take 5 mg by mouth daily.   Yes [provider]  SYNTHROID 100 MCG tablet Take 100 mcg by mouth daily. 03/09/17  Yes [provider]  traMADol (ULTRAM) 50 MG tablet Take one tablet by mouth twice daily for pains PRN Patient taking differently: Take 50 mg by mouth every 8 (eight) hours as needed for moderate pain.  09/29/15  Yes Regalado, Belkys A, MD  doxycycline (VIBRA-TABS) 100 MG tablet Take 1 tablet (100 mg total) by mouth 2 (two) times daily. Patient not taking: Reported on 11/26/2017 11/24/17   Trula Slade, DPM  methotrexate (RHEUMATREX) 2.5 MG  tablet Take 10 mg by mouth every Wednesday.    [provider]  mupirocin ointment (BACTROBAN) 2 % Apply to wound twice a day. Patient not taking: Reported on 11/26/2017 09/01/17   Garrel Ridgel, DPM  ondansetron [facility]) 8 MG tablet  09/09/17   [provider]  polyethylene glycol powder (GLYCOLAX/MIRALAX) powder  06/30/17   [provider]    Physical Exam: Vitals:   11/26/17 0741 11/26/17 0742 11/26/17 0800 11/26/17 1000  BP: (!) 118/56  (!) 122/54 (!) 122/54  Pulse: 82  83 80  Resp: 20  19 18   Temp: (!) 101.6 F (38.7 C)  TempSrc: Rectal     SpO2: 95%  95% 98%  Weight:  83.5 kg (184 lb)    Height:  5\' 11"  (1.803 m)       General: lying supine in bed in NAD, able to follow some commands Eyes: PERRL, EOMI, normal lids, iris ENT: mucous membranes are dry, no oropharyngeal lesions noted Neck: no LAD, masses or thyromegaly; no carotid bruits Cardiovascular: RRR c 3/6 SEM, no rub or gallop, no LE edema Respiratory:  Decreased inspiratory effort, diminished bilaterally lower lobes, no increased wob, no w/r/c Abdomen: soft, seemingly nontender, BS+ Skin: Medial aspect of first metatarsal with eschar, no bloody drainage, but no purulence, not malodorous, hypertrophic nails  Musculoskeletal: grossly normal tone BUE/BLE generalized weakness Psychiatric: nonverbal, able to follow some commands Neurologic: no focal deficits noted though exam is quite limited.     Radiological Exams on Admission: Dg Chest Portable 1 View  Result Date: 11/26/2017 CLINICAL DATA:  84 year old male with lethargy.  Fever and hypoxia. EXAM: PORTABLE CHEST 1 VIEW COMPARISON:  05/27/2017 and earlier. FINDINGS: Portable AP semi upright view at 0753 hr. Stable lung volumes. Stable cardiac size and mediastinal contours. Sequelae of CABG. No pneumothorax or pulmonary edema. Chronic blunting of the left costophrenic angle is unchanged since 2,017. No acute pleural effusion or acute  pulmonary opacity. Negative visible bowel gas pattern. Stable cholecystectomy clips. Calcified aortic atherosclerosis. Visualized tracheal air column is within normal limits. IMPRESSION: 1.  No acute cardiopulmonary abnormality. 2.  Aortic Atherosclerosis (ICD10-I70.0). Electronically Signed   By: Genevie Ann M.D.   On: 11/26/2017 08:27    EKG: Independently reviewed.  NSR with rate 84bpm; nonspecific ST changes with no evidence of acute ischemia   Labs on Admission: I have personally reviewed the available labs and imaging studies at the time of the admission.  Pertinent labs:  WBC 20.5 hgb 11.1 K 3.0 Creatinine 1.78 Lactic acid 1.64 Flu PCR negative U/A (in/out cath) unremarkable   Assessment/Plan Active Problems:   Chronic kidney disease (CKD), stage III (moderate) (HCC)   Polymyalgia rheumatica (HCC)   Carotid artery disease (HCC)   Essential hypertension, benign   Hypothyroidism   Physical deconditioning   Chronic diastolic heart failure (HCC)   Sepsis (HCC)   Gangrene of left foot (HCC)   Parkinson disease ([place])    [place]is with acute encephalopathy  -as evidenced by Temp 101.7, WBC 20.5 -unclear source on admit. Suspect d/t left foot wound/gangrene vs developing pneumonia -u/a unremarkable, blood and urine cultures pending. Given advanced age and current state, MRI of foot would not likely change course of treatment. Need to contact daughter to determine how aggressive she wants to be -hemodynamically stable in ED after 250cc fluid bolus. Gentle IVFs for now. monitor -empiric Vancomycin, Levaquin and Cefepime (has tolerated well in past admissions)  AKI in setting of stage III CKD -mild, creatinine 1.78<--baseline ~1.2-1.3 -appears dehydrated on exam -gentle IVFs, avoid nephrotoxic agents -bmet in am  Hypokalemia -mild. Replete with IVFs -bmet in am  Polymyalgia Rheumatica  -on methotrexate and chronic prednisone -will give stress dose steroids with acute illness  and not alert enough for po's  Left foot wound/gangrene -chronic issue. Possible source of sepsis. -wound care consult.  Chronic diastolic heart failure -last echo on file 07/2014 EF 45-50%, mild LVH -actually appears volume depleted on exam. Hold lasix. Gentle IVFs  Parkinson's disease -no meds prior to admit  Hypothyroidism -on synthroid though  Amemia -chronic disease. No acute blood loss on exam -monitor  CAD, PVD -on eliquis . Will need to hold until more alert  Deconditioning/advanced age -pt DNR. Given advanced age and currrent sepsis, will attempt to contact daughter regarding goals of care.     DVT prophylaxis: Lovenoxs Code Status: DNR Family Communication: will attempt to contact next of kin  Disposition Plan: SNF when stable Consults called: wound care, CSW Admission status: inpatient  Patrici Ranks, NP-C [name] [place]  pgr [phone]   If note is complete, please contact covering daytime or nighttime physician. [email or web address] Password [email or web address]  11/26/2017, 11:17 AM

## 2017-11-26 NOTE — Progress Notes (Addendum)
Pharmacy Antibiotic Note  Barry Wright is a 84 y.o. male admitted on 11/26/2017 with sepsis.    Plan: Cefepime 2 g x 1 then 1 g q24h Vanc 1 g q24h Add lvq 750 x 1 then 500 q48h Monitor renal fx cx vt prn  Height: 5\' 11"  (180.3 cm) Weight: 184 lb (83.5 kg) IBW/kg (Calculated) : 75.3  Temp (24hrs), Avg:101.6 F (38.7 C), Min:101.6 F (38.7 C), Max:101.6 F (38.7 C)  Recent Labs  Lab 11/26/17 0817 11/26/17 0835  WBC 20.5*  --   CREATININE 1.78*  --   LATICACIDVEN  --  1.64    Estimated Creatinine Clearance: 26.4 mL/min (A) (by C-G formula based on SCr of 1.78 mg/dL (H)).    Allergies  Allergen Reactions  . Penicillins Rash and Other (See Comments)    Has patient had a PCN reaction causing immediate rash, facial/tongue/throat swelling, SOB or lightheadedness with hypotension: Unknown Has patient had a PCN reaction causing severe rash involving mucus membranes or skin necrosis: Unknown Has patient had a PCN reaction that required hospitalization: Unknown Has patient had a PCN reaction occurring within the last 10 years: Unknown If all of the above answers are "NO", then may proceed with Cephalosporin use.    Levester Fresh, PharmD, BCPS, BCCCP Clinical Pharmacist Clinical phone for 11/26/2017 from 7a-3:30p: [phone] If after 3:30p, please call main pharmacy at: [phone] 11/26/2017 9:44 AM

## 2017-11-26 NOTE — ED Notes (Signed)
Called Pharmacy to discuss/verify second order of Vanc, One order was administered at 11am. phamacists states that they are working on it

## 2017-11-26 NOTE — Consult Note (Signed)
[place] Nurse wound consult note Reason for Consult: left foot wounds Patient just seen in podiatry office 11/24/17, note reviewed  Wound type: arterial ulcerations Weak pulses distally, no edema  Pressure Injury POA: NA Measurement: Left medial: 4cm x 2cm x 0cm  Left lateral involving entire 5th metatarsal: 4.5cm x 2cm x 0.1cm  Left 4th toe: 0.4cm x 0.4cm x 0cm  Wound bed: Left medial;100% eschar Left lateral: 100% eschar with dry gangrene of the digit, minimal drainage in the webspace between the 4th and 5th toe, which is also noted at the time the patient saw Dr. Jacqualyn Posey Left 4th toe tip: 100% eschar  Drainage (amount, consistency, odor) see above Periwound: intact Dressing procedure/placement/frequency: Continue to paint area with betadine and allow to air dry, adding strip of Aquacel Ag between the 4th and 5th toes to attempt to dry this area.  Would consider plain films if aggressive care desired, to rule out osteomyelitis.   Discussed POC with bedside nurse, patient altered at the time of assessment. No family at bedside.  Re consult if needed, will not follow at this time. Thanks  Auston Halfmann [employer], RN,CWOCN, CNS, [place] [phone])

## 2017-11-26 NOTE — ED Provider Notes (Addendum)
[facility] EMERGENCY DEPARTMENT Provider Note   CSN: [id number] Arrival date & time: 11/26/17  7564     History   Chief Complaint Chief Complaint  Patient presents with  . Fever  . Code Sepsis   Level 5 caveat: Altered mental status  HPI ZOLLIE ELLERY is a 84 y.o. male.  HPI Patient is a 84 year old male who presents to the emergency department [facility] for reported fever hypoxia and hypotension.  Presents with a temp of 101.  Reported temperature of 102 at this facility.  Blood pressure was 80/40 before 250 cc of fluid was given.  Sats were 84% for EMS.  On arrival to emergency department the patient's O2 sats are 98% on room air.  His blood pressures greater than 332 systolic.  He has a DNR in place.  No family is available for additional information.  He was recently seen and evaluated for chronic limb ischemia of the left lower extremity with an area of gangrene and a wound which is being managed by his foot specialist.  He was recently started on doxycycline 2 days ago.  Patient is unable to provide any other reasonable history    Past Medical History:  Diagnosis Date  . CAD (coronary artery disease)   . CHF (congestive heart failure) ([place])   . Chronic back pain   . Chronic kidney disease (CKD), stage III (moderate) (HCC)   . History of nephrectomy   . Hx of CABG   . Hypercholesteremia   . Hypothyroidism   . Internal carotid artery stenosis   . Left bundle branch block   . Myocardial infarction ([place])   . Polymyalgia rheumatica ([place])   . Stroke [facility])     Patient Active Problem List   Diagnosis Date Noted  . Critical lower limb ischemia 09/29/2017  . Sepsis ([place]) 05/27/2017  . Lower urinary tract infectious disease   . History of nephrectomy   . Acute lower UTI 02/21/2016  . Pressure ulcer 09/29/2015  . Acute encephalopathy   . Chronic back pain 09/26/2015  . Generalized weakness 04/13/2015  . Chronic diastolic heart failure  ([place]) 06/19/2014  . Essential hypertension, benign 03/08/2014  . Hypothyroidism 03/08/2014  . Protein-calorie malnutrition, moderate ([place]) 03/08/2014  . Physical deconditioning 03/08/2014  . Abnormal serum protein electrophoresis 01/07/2013  . Chronic kidney disease (CKD), stage III (moderate) ([place]) 01/03/2013  . Polymyalgia rheumatica ([place]) 01/03/2013  . Osteopenia 01/03/2013  . Carotid artery disease ([place]) 01/03/2013  . Spinal stenosis of lumbar region 01/03/2013  . Coronary artery disease 01/03/2013  . Old MI (myocardial infarction) 01/03/2013  . Transient cerebral ischemia 11/18/2011    Past Surgical History:  Procedure Laterality Date  . CHOLECYSTECTOMY    . CORONARY ARTERY BYPASS GRAFT    . KIDNEY SURGERY     kidney removed  . NEPHRECTOMY         Home Medications    Prior to Admission medications   Medication Sig Start Date End Date Taking? Authorizing Provider  acetaminophen (TYLENOL) 500 MG tablet Take 1,000 mg by mouth 2 (two) times daily.    Yes [provider]  Calcium Carb-Cholecalciferol (CALCIUM-VITAMIN D) 500-200 MG-UNIT tablet Take 1 tablet by mouth daily.   Yes [provider]  dextromethorphan (DELSYM COUGH CHILDRENS) 30 MG/5ML liquid Take 30 mg by mouth every 12 (twelve) hours as needed for cough.   Yes [provider]  doxycycline (VIBRAMYCIN) 100 MG capsule Take 1 capsule (100 mg total) by  mouth 2 (two) times daily. 09/03/17  Yes Hyatt, Max T, DPM  ELIQUIS 2.5 MG TABS tablet Take 2.5 mg by mouth 2 (two) times daily.  09/14/17  Yes [provider]  feeding supplement, ENSURE ENLIVE, (ENSURE ENLIVE) LIQD Take 237 mLs by mouth 2 (two) times daily between meals. 05/12/16  Yes Jani Gravel, MD  FOLIC ACID PO Take 1 mg by mouth daily.    Yes [provider]  furosemide (LASIX) 20 MG tablet Take 0.5 tablets (10 mg total) by mouth daily. Resume on Saturday 01/31/2017. Patient taking differently: Take 20 mg by mouth daily.  Resume on Saturday 01/31/2017. 01/31/17  Yes Eugenie Filler, MD  gabapentin (NEURONTIN) 100 MG capsule Take 200 mg by mouth 3 (three) times daily.    Yes [provider]  Multiple Vitamins-Minerals (MULTIVITAMIN WITH MINERALS) tablet Take 1 tablet by mouth daily.   Yes [provider]  predniSONE (DELTASONE) 5 MG tablet Take 5 mg by mouth daily.   Yes [provider]  SYNTHROID 100 MCG tablet Take 100 mcg by mouth daily. 03/09/17  Yes [provider]  traMADol (ULTRAM) 50 MG tablet Take one tablet by mouth twice daily for pains PRN Patient taking differently: Take 50 mg by mouth every 8 (eight) hours as needed for moderate pain.  09/29/15  Yes Regalado, Belkys A, MD  doxycycline (VIBRA-TABS) 100 MG tablet Take 1 tablet (100 mg total) by mouth 2 (two) times daily. Patient not taking: Reported on 11/26/2017 11/24/17   Trula Slade, DPM  methotrexate (RHEUMATREX) 2.5 MG tablet Take 10 mg by mouth every Wednesday.    [provider]  mupirocin ointment (BACTROBAN) 2 % Apply to wound twice a day. Patient not taking: Reported on 11/26/2017 09/01/17   Garrel Ridgel, DPM  ondansetron [facility]) 8 MG tablet  09/09/17   [provider]  polyethylene glycol powder (GLYCOLAX/MIRALAX) powder  06/30/17   [provider]    Family History Family History  Problem Relation Age of Onset  . Other Mother        [place] Flu  . Heart disease Father     Social History Social History   Tobacco Use  . Smoking status: Former Smoker    Types: Cigarettes    Last attempt to quit: 11/10/1970    Years since quitting: 47.0  . Smokeless tobacco: Never Used  Substance Use Topics  . Alcohol use: No  . Drug use: No     Allergies   Penicillins   Review of Systems Review of Systems  Unable to perform ROS: Mental status change     Physical Exam Updated Vital Signs BP (!) 118/56 (BP Location: Right Arm)   Pulse 82   Temp (!) 101.6 F (38.7  C) (Rectal)   Resp 20   Ht 5\' 11"  (1.803 m)   Wt 83.5 kg (184 lb)   SpO2 95%   BMI 25.66 kg/m   Physical Exam  Constitutional: He appears well-developed and well-nourished.  HENT:  Head: Normocephalic and atraumatic.  Eyes: EOM are normal.  Neck: Normal range of motion.  Cardiovascular: Normal rate, regular rhythm, normal heart sounds and intact distal pulses.  Pulmonary/Chest: Effort normal and breath sounds normal. No respiratory distress.  Abdominal: Soft. He exhibits no distension. There is no tenderness.  Musculoskeletal: Normal range of motion.  Faint pulses in his left foot.  His left foot appears to be perfused.  Little toe on his left foot is demonstrating dry  gangrene.  There is a wound on the medial aspect of his left foot with eschar in place and a small amount of surrounding.  No drainage.  Neurological: He is alert.  Follow simple commands.  Moves all 4 extremities.  Skin: Skin is warm and dry.  Psychiatric: He has a normal mood and affect. Judgment normal.  Nursing note and vitals reviewed.    ED Treatments / Results  Labs (all labs ordered are listed, but only abnormal results are displayed) Labs Reviewed  CBC WITH DIFFERENTIAL/PLATELET - Abnormal; Notable for the following components:      Result Value   WBC 20.5 (*)    RBC 3.22 (*)    Hemoglobin 11.1 (*)    HCT 34.0 (*)    MCV 105.6 (*)    MCH 34.5 (*)    RDW 15.7 (*)    Platelets 138 (*)    Neutro Abs 16.7 (*)    All other components within normal limits  COMPREHENSIVE METABOLIC PANEL - Abnormal; Notable for the following components:   Potassium 3.0 (*)    Glucose, Bld 122 (*)    BUN 32 (*)    Creatinine, Ser 1.78 (*)    Total Protein 5.5 (*)    Albumin 2.9 (*)    ALT 13 (*)    GFR calc non Af Amer 31 (*)    GFR calc Af Amer 36 (*)    All other components within normal limits  CULTURE, BLOOD (ROUTINE X 2)  CULTURE, BLOOD (ROUTINE X 2)  INFLUENZA PANEL BY PCR (TYPE A & B)  I-STAT CG4 LACTIC  ACID, ED    EKG  EKG Interpretation  Date/Time:  Thursday November 26 2017 07:29:05 EST Ventricular Rate:  84 PR Interval:    QRS Duration: 157 QT Interval:  441 QTC Calculation: 522 R Axis:   -75 Text Interpretation:  Sinus rhythm Ventricular premature complex Short PR interval Right bundle branch block Left ventricular hypertrophy Baseline wander in lead(s) III No significant change was found Confirmed by Jola Schmidt [phone]) on 11/26/2017 9:43:42 AM       Radiology Dg Chest Portable 1 View  Result Date: 11/26/2017 CLINICAL DATA:  84 year old male with lethargy.  Fever and hypoxia. EXAM: PORTABLE CHEST 1 VIEW COMPARISON:  05/27/2017 and earlier. FINDINGS: Portable AP semi upright view at 0753 hr. Stable lung volumes. Stable cardiac size and mediastinal contours. Sequelae of CABG. No pneumothorax or pulmonary edema. Chronic blunting of the left costophrenic angle is unchanged since 2,017. No acute pleural effusion or acute pulmonary opacity. Negative visible bowel gas pattern. Stable cholecystectomy clips. Calcified aortic atherosclerosis. Visualized tracheal air column is within normal limits. IMPRESSION: 1.  No acute cardiopulmonary abnormality. 2.  Aortic Atherosclerosis (ICD10-I70.0). Electronically Signed   By: Genevie Ann M.D.   On: 11/26/2017 08:27    Procedures .Critical Care Performed by: Jola Schmidt, MD Authorized by: Jola Schmidt, MD     CRITICAL CARE Performed by: Jola Schmidt Total critical care time: 31 minutes Critical care time was exclusive of separately billable procedures and treating other patients. Critical care was necessary to treat or prevent imminent or life-threatening deterioration. Critical care was time spent personally by me on the following activities: development of treatment plan with patient and/or surrogate as well as nursing, discussions with consultants, evaluation of patient's response to treatment, examination of patient, obtaining history from  patient or surrogate, ordering and performing treatments and interventions, ordering and review of laboratory studies, ordering and review of radiographic  studies, pulse oximetry and re-evaluation of patient's condition.   Medications Ordered in ED Medications  vancomycin (VANCOCIN) IVPB 1000 mg/200 mL premix (not administered)  ceFEPIme (MAXIPIME) 2 g in dextrose 5 % 50 mL IVPB (not administered)  ceFEPIme (MAXIPIME) 1 g in dextrose 5 % 50 mL IVPB (not administered)  vancomycin (VANCOCIN) IVPB 1000 mg/200 mL premix (not administered)  sodium chloride 0.9 % bolus 1,500 mL (1,500 mLs Intravenous New Bag/Given 11/26/17 0920)     Initial Impression / Assessment and Plan / ED Course  I have reviewed the triage vital signs and the nursing notes.  Pertinent labs & imaging results that were available during my care of the patient were reviewed by me and considered in my medical decision making (see chart for details).     Sepsis.  Could represent cellulitis on the left foot.  Thank and Zosyn given.  Chest x-ray without pneumonia.  No hypoxia noted on arrival to the emergency department.  Abdomen is soft and nontender.  Blood cultures obtained.  Patient will be admitted the hospitalist service.  I will need to speak with family for additional information as well as goals of care.  Given his advanced age he may benefit from palliative care consultation.  Urine pending.  Final Clinical Impressions(s) / ED Diagnoses   Final diagnoses:  Sepsis, due to unspecified organism [facility])  AKI (acute kidney injury) [facility])    ED Discharge Orders    None       Jola Schmidt, MD 11/26/17 1647    Jola Schmidt, MD 12/07/17 2158

## 2017-11-26 NOTE — ED Notes (Signed)
PT is asleep, responds to pain, no signs of pain/discomfort observed

## 2017-11-26 NOTE — ED Notes (Signed)
Pt now very sluggish again, alert to speech but more difficult to arouse. Pt's BP down to 80/40 manual checked by this RN. Admitting paged. 1L bolus initiated.

## 2017-11-26 NOTE — ED Notes (Signed)
Patient's wife is at bedside, provider paged to notify

## 2017-11-26 NOTE — ED Triage Notes (Signed)
To ED via [facility] from [facility]-- pt is normally alert/ upo and moving with assistance (per nursing home staff) -- this am wheen staff tried to wake him at 5am, pt was lethargic, slow to respond, nonverbal.  On EMS arrival, paramedics found same-- pt received 250cc NS - became more alert. Remains nonverbal, but will smile and look at staff.

## 2017-11-26 NOTE — Progress Notes (Signed)
Sacral area pinkish, no skin breakage, applied foam dressing.

## 2017-11-27 LAB — BASIC METABOLIC PANEL
Anion gap: 14 (ref 5–15)
BUN: 32 mg/dL — AB (ref 6–20)
CHLORIDE: 113 mmol/L — AB (ref 101–111)
CO2: 18 mmol/L — ABNORMAL LOW (ref 22–32)
CREATININE: 1.46 mg/dL — AB (ref 0.61–1.24)
Calcium: 7.9 mg/dL — ABNORMAL LOW (ref 8.9–10.3)
GFR calc Af Amer: 45 mL/min — ABNORMAL LOW (ref >60)
GFR calc non Af Amer: 39 mL/min — ABNORMAL LOW (ref >60)
GLUCOSE: 126 mg/dL — AB (ref 65–99)
POTASSIUM: 3.7 mmol/L (ref 3.5–5.1)
Sodium: 145 mmol/L (ref 135–145)

## 2017-11-27 LAB — CBC
HCT: 27.1 % — ABNORMAL LOW (ref 39.0–52.0)
Hemoglobin: 9 g/dL — ABNORMAL LOW (ref 13.0–17.0)
MCH: 35 pg — AB (ref 26.0–34.0)
MCHC: 33.2 g/dL (ref 30.0–36.0)
MCV: 105.4 fL — AB (ref 78.0–100.0)
PLATELETS: 129 10*3/uL — AB (ref 150–400)
RBC: 2.57 MIL/uL — AB (ref 4.22–5.81)
RDW: 16 % — ABNORMAL HIGH (ref 11.5–15.5)
WBC: 12.2 10*3/uL — ABNORMAL HIGH (ref 4.0–10.5)

## 2017-11-27 LAB — URINE CULTURE: Culture: NO GROWTH

## 2017-11-27 MED ORDER — METRONIDAZOLE IN NACL 5-0.79 MG/ML-% IV SOLN
500.0000 mg | Freq: Three times a day (TID) | INTRAVENOUS | Status: DC
  Administered 2017-11-27 – 2017-12-01 (×12): 500 mg via INTRAVENOUS
  Filled 2017-11-27 (×13): qty 100

## 2017-11-27 MED ORDER — HEPARIN SODIUM (PORCINE) 5000 UNIT/ML IJ SOLN
5000.0000 [IU] | Freq: Three times a day (TID) | INTRAMUSCULAR | Status: DC
  Administered 2017-11-27 – 2017-11-28 (×2): 5000 [IU] via SUBCUTANEOUS
  Filled 2017-11-27 (×2): qty 1

## 2017-11-27 MED ORDER — DEXTROSE 5 % IV SOLN
1.0000 g | Freq: Two times a day (BID) | INTRAVENOUS | Status: DC
  Administered 2017-11-27 – 2017-11-30 (×7): 1 g via INTRAVENOUS
  Filled 2017-11-27 (×8): qty 1

## 2017-11-27 MED ORDER — VANCOMYCIN HCL IN DEXTROSE 1-5 GM/200ML-% IV SOLN: 1000.0000 mg | Freq: Once | INTRAVENOUS | Status: DC

## 2017-11-27 NOTE — Progress Notes (Signed)
PROGRESS NOTE  Barry Wright RKY:[id number] DOB: 1920-04-03 DOA: 11/26/2017 PCP: Lajean Manes, MD  HPI/Recap of past 24 hours: Barry Wright is a 84 y.o. male with medical history significant of gangrene/wound to left foot followed by podiatry, Parkinson's disease, CVA, PMR, CAD c hx CABG, LBBB, stage III CKD, hypothyroidism, hypercholesterolemia hx nephrectomy. Pt unable to give hx given acute illness. I am uncertain of baseline mentation. Pt presents from SNF with reported fever 102 and hypotension with mention of hypoxia by ems though non in ED on room air. Pt seen by podiatry 2 days prior for chronic wound to left foot/gangrene at which time nails and wound were debrided and pt put on po doxycyline for new finding of clear drainage from wound. Admitted due to sepsis 2/2 to left foot gangrene.  Pt seen and examined at his bedside. Denies any pain. He is alert and oriented x 3 this morning. Hard of hearing.   Assessment/Plan: Active Problems:   Chronic kidney disease (CKD), stage III (moderate) (HCC)   Polymyalgia rheumatica (HCC)   Carotid artery disease (HCC)   Hypokalemia   Essential hypertension, benign   Hypothyroidism   Physical deconditioning   Chronic diastolic heart failure (HCC)   Sepsis (HCC)   Gangrene of left foot (HCC)   Parkinson disease ([place])   Sepsis 2/2 left foot gangrene  -as evidenced by Temp 101.7, WBC 20.5 -unclear source on admit. Suspect d/t left foot wound/gangrene vs developing pneumonia -u/a unremarkable, blood and urine cultures no growth 1 day.  -empiric IV Vancomycin, IV Cefepime and IV flagyl for osteomyelitis day # 0 -foot MRI to rule out osteomyelitis 11/27/17 -wound care following -consider consulting orthopedic surgery if positive for osteomyelitis  Acute metabolic encephalopathy in the setting of sepsis, improving -management as stated above  AKI in setting of stage III CKD -mild, creatinine 1.78<--baseline ~1.2-1.3 -appears  dehydrated on exam -gentle IVFs, avoid nephrotoxic agents -bmet in am  Hypokalemia -mild. Replete with IVFs -bmet in am  Polymyalgia Rheumatica  -on methotrexate and chronic prednisone -will give stress dose steroids with acute illness and not alert enough for po's  Left foot wound/gangrene -chronic issue. Possible source of sepsis. -wound care consult.  Chronic diastolic heart failure -last echo on file 07/2014 EF 45-50%, mild LVH -actually appears volume depleted on exam. Hold lasix. Gentle IVFs  Parkinson's disease -no meds prior to admit  Hypothyroidism -on synthroid though  Amemia -chronic disease. No acute blood loss on exam -monitor  CAD, PVD -on eliquis . Will need to hold until more alert  Deconditioning/advanced age -pt DNR -Pt evaluate     Code Status: DNR  Family Communication: none at bedside. Called daughter to update on current condition. She states no hx of dementia. Patient alert and oriented x 3 on physical exam 11/27/17.  Disposition Plan: will stay another midnight to continue IV antibioltics    Consultants:  Wound care   Procedures:  none  Antimicrobials:  IV vancomycin IV cefepime, IV flagyl day#0  DVT prophylaxis:   Hep sq tid  Objective: Vitals:   11/26/17 1650 11/26/17 1738 11/26/17 2000 11/27/17 0509  BP: (!) 120/59 (!) 115/50 (!) 129/54 131/61  Pulse: 64 63 61 71  Resp: (!) 22 20 20 18   Temp:  98.2 F (36.8 C) 98 F (36.7 C) 98.2 F (36.8 C)  TempSrc:  Oral Oral Oral  SpO2: 100% 100% 99% 99%  Weight:      Height:  Intake/Output Summary (Last 24 hours) at 11/27/2017 0827 Last data filed at 11/27/2017 0630 Gross per 24 hour  Intake 3540 ml  Output 252 ml  Net 3288 ml   Filed Weights   11/26/17 0742  Weight: 83.5 kg (184 lb)    Exam:   General: lying supine in bed in NAD A&O x3  Eyes: PERRL, EOMI, normal lids, iris  ENT: mucous membranes are dry, no oropharyngeal lesions noted  Neck:  no LAD, masses or thyromegaly; no carotid bruits  Cardiovascular: RRR no rub or gallop, no LE edema  Respiratory:  Decreased inspiratory effort, diminished bilaterally lower lobes, no increased wob, no w/r/c  Abdomen: soft, seemingly nontender, BS+  Skin: Medial aspect of left first metatarsal with eschar, no bloody drainage, no purulence, not malodorous  Musculoskeletal: grossly normal tone generalized weakness  Psychiatric: mood appropriate for condition and settiing   Data Reviewed: CBC: Recent Labs  Lab 11/26/17 0817 11/26/17 1140  WBC 20.5* 17.8*  NEUTROABS 16.7*  --   HGB 11.1* 10.3*  HCT 34.0* 31.4*  MCV 105.6* 106.4*  PLT 138* 841*   Basic Metabolic Panel: Recent Labs  Lab 11/26/17 0817 11/26/17 1140  NA 143  --   K 3.0*  --   CL 105  --   CO2 26  --   GLUCOSE 122*  --   BUN 32*  --   CREATININE 1.78* 1.72*  CALCIUM 9.2  --    GFR: Estimated Creatinine Clearance: 27.4 mL/min (A) (by C-G formula based on SCr of 1.72 mg/dL (H)). Liver Function Tests: Recent Labs  Lab 11/26/17 0817  AST 25  ALT 13*  ALKPHOS 66  BILITOT 1.1  PROT 5.5*  ALBUMIN 2.9*   No results for input(s): LIPASE, AMYLASE in the last 168 hours. No results for input(s): AMMONIA in the last 168 hours. Coagulation Profile: Recent Labs  Lab 11/26/17 1140  INR 1.24   Cardiac Enzymes: No results for input(s): CKTOTAL, CKMB, CKMBINDEX, TROPONINI in the last 168 hours. BNP (last 3 results) No results for input(s): PROBNP in the last 8760 hours. HbA1C: No results for input(s): HGBA1C in the last 72 hours. CBG: Recent Labs  Lab 11/26/17 1411  GLUCAP 118*   Lipid Profile: No results for input(s): CHOL, HDL, LDLCALC, TRIG, CHOLHDL, LDLDIRECT in the last 72 hours. Thyroid Function Tests: No results for input(s): TSH, T4TOTAL, FREET4, T3FREE, THYROIDAB in the last 72 hours. Anemia Panel: No results for input(s): VITAMINB12, FOLATE, FERRITIN, TIBC, IRON, RETICCTPCT in the last 72  hours. Urine analysis:    Component Value Date/Time   COLORURINE YELLOW 11/26/2017 1026   APPEARANCEUR CLEAR 11/26/2017 1026   LABSPEC <1.005 (L) 11/26/2017 1026   PHURINE 5.0 11/26/2017 1026   GLUCOSEU NEGATIVE 11/26/2017 1026   HGBUR SMALL (A) 11/26/2017 1026   BILIRUBINUR NEGATIVE 11/26/2017 1026   KETONESUR NEGATIVE 11/26/2017 1026   PROTEINUR NEGATIVE 11/26/2017 1026   UROBILINOGEN 0.2 04/11/2015 1437   NITRITE NEGATIVE 11/26/2017 1026   LEUKOCYTESUR NEGATIVE 11/26/2017 1026   Sepsis Labs: @LABRCNTIP (procalcitonin:4,lacticidven:4)  ) Recent Results (from the past 240 hour(s))  Urine culture     Status: None   Collection Time: 11/26/17 10:26 AM  Result Value Ref Range Status   Specimen Description URINE, CATHETERIZED  Final   Special Requests NONE  Final   Culture NO GROWTH  Final   Report Status 11/27/2017 FINAL  Final      Studies: No results found.  Scheduled Meds: . hydrocortisone sod succinate (SOLU-CORTEF) inj  50 mg Intravenous Q6H    Continuous Infusions: . 0.9 % NaCl with KCl 20 mEq / L 75 mL/hr at 11/26/17 2344  . ceFEPime (MAXIPIME) IV    . vancomycin       LOS: 1 day     Kayleen Memos, MD [name] Pager [phone]  If 7PM-7AM, please contact night-coverage [email or web address] Password [email or web address] 11/27/2017, 8:27 AM

## 2017-11-28 ENCOUNTER — Inpatient Hospital Stay (HOSPITAL_COMMUNITY): Payer: Medicare Other

## 2017-11-28 MED ORDER — VANCOMYCIN HCL IN DEXTROSE 1-5 GM/200ML-% IV SOLN
1000.0000 mg | INTRAVENOUS | Status: DC
  Administered 2017-11-28 – 2017-12-01 (×4): 1000 mg via INTRAVENOUS
  Filled 2017-11-28 (×4): qty 200

## 2017-11-28 MED ORDER — POLYVINYL ALCOHOL 1.4 % OP SOLN
1.0000 [drp] | OPHTHALMIC | Status: DC | PRN
Start: 2017-11-28 — End: 2017-12-02
  Administered 2017-11-28: 1 [drp] via OPHTHALMIC
  Filled 2017-11-28: qty 15

## 2017-11-28 MED ORDER — APIXABAN 2.5 MG PO TABS
2.5000 mg | ORAL_TABLET | Freq: Two times a day (BID) | ORAL | Status: DC
  Administered 2017-11-28 – 2017-12-02 (×9): 2.5 mg via ORAL
  Filled 2017-11-28 (×9): qty 1

## 2017-11-28 MED ORDER — TRAMADOL HCL 50 MG PO TABS
50.0000 mg | ORAL_TABLET | Freq: Three times a day (TID) | ORAL | Status: DC | PRN
  Administered 2017-11-29 – 2017-12-01 (×4): 50 mg via ORAL
  Filled 2017-11-28 (×4): qty 1

## 2017-11-28 MED ORDER — HYPROMELLOSE (GONIOSCOPIC) 2.5 % OP SOLN
1.0000 [drp] | OPHTHALMIC | Status: DC | PRN
  Filled 2017-11-28: qty 15

## 2017-11-28 MED ORDER — LEVOTHYROXINE SODIUM 100 MCG PO TABS
100.0000 ug | ORAL_TABLET | Freq: Every day | ORAL | Status: DC
  Administered 2017-11-29 – 2017-12-02 (×4): 100 ug via ORAL
  Filled 2017-11-28 (×4): qty 1

## 2017-11-28 MED ORDER — PREDNISONE 5 MG PO TABS: 5.0000 mg | ORAL_TABLET | Freq: Every day | ORAL | Status: DC

## 2017-11-28 MED ORDER — GABAPENTIN 100 MG PO CAPS
200.0000 mg | ORAL_CAPSULE | Freq: Three times a day (TID) | ORAL | Status: DC
  Administered 2017-11-28 – 2017-12-02 (×14): 200 mg via ORAL
  Filled 2017-11-28 (×14): qty 2

## 2017-11-28 MED ORDER — FOLIC ACID 1 MG PO TABS
1.0000 mg | ORAL_TABLET | Freq: Every day | ORAL | Status: DC
  Administered 2017-11-28 – 2017-12-02 (×5): 1 mg via ORAL
  Filled 2017-11-28 (×5): qty 1

## 2017-11-28 NOTE — Progress Notes (Signed)
PROGRESS NOTE    Barry Wright  ZTI:[id number] DOB: 02/20/1920 DOA: 11/26/2017 PCP: Lajean Manes, MD   Brief Narrative:   84 year old with a history of left foot gangrene followed by podiatry, advanced Parkinson's disease, CVA, CAD status post CABG, stage III kidney disease, hypothyroidism, hypercholesterolemia and nephrectomy was sent to the hospital for evaluation of change in mental status, fever and hypotension.  He was admitted for sepsis secondary to left foot gangrene.  He was started on broad-spectrum antibiotics.  Assessment & Plan:   Active Problems:   Chronic kidney disease (CKD), stage III (moderate) (HCC)   Polymyalgia rheumatica (HCC)   Carotid artery disease (HCC)   Hypokalemia   Essential hypertension, benign   Hypothyroidism   Physical deconditioning   Chronic diastolic heart failure (HCC)   Sepsis (HCC)   Gangrene of left foot (HCC)   Parkinson disease (HCC)  Sepsis secondary to left foot gangrene -Cultures have thus far remain negative, continue vancomycin, cefepime and Flagyl -MRI has been ordered-its pending at this time -Wound care team is following - Patient may require amputation depending on the MRI results.  I have discussed this with the family and at this time they have not decided if they would like to proceed with this if it comes to it.  Will await MRI  Acute delirium -Could be from underlying infection versus pain versus being at unfamiliar place.  At this time will avoid giving him extra medication and try and use redirection method in the morning.  Currently appears to be very calm.  History of polymyalgia rheumatica -At home he is on methotrexate and prednisone.  His blood pressure remained stable, if it drops we will consider giving stress dose steroids  Chronic diastolic and systolic heart failure, ejection fraction 45% in 2015 -Currently appears to be slightly on the dehydrated side therefore holding Lasix and getting gentle  hydration  Hypothyroidism -We will restart his home Synthroid today  Anemia chronic disease -No signs of acute blood loss or active blood loss at this time.  Hemoglobin is stable  Coronary artery disease/peripheral vascular disease -Patient is on home Eliquis, will restart this today  Peripheral neuropathy -Restart gabapentin  DVT prophylaxis: Home Eliquis restarted Code Status: DO NOT RESUSCITATE Family Communication: Spoke extensively with the daughter who is at bedside Disposition Plan: To be determined    Subjective: No acute events overnight.  Patient is try to get out of bed at this time but not overly aggressive, he is easily redirectable.  Objective: Vitals:   11/27/17 1300 11/27/17 1951 11/28/17 0403 11/28/17 0800  BP: (!) 118/56 136/75 (!) 146/63 (!) 128/49  Pulse: 70 70 66 (!) 58  Resp: 20 20 18 18   Temp: 98 F (36.7 C) 97.8 F (36.6 C) (!) 97.4 F (36.3 C) 97.6 F (36.4 C)  TempSrc: Oral Oral Oral Oral  SpO2: 99% 98% 97% 97%  Weight:      Height:        Intake/Output Summary (Last 24 hours) at 11/28/2017 1133 Last data filed at 11/28/2017 8250 Gross per 24 hour  Intake 2002.5 ml  Output 627 ml  Net 1375.5 ml   Filed Weights   11/26/17 0742  Weight: 83.5 kg (184 lb)    Examination:  General exam: Appears calm and comfortable, pleasantly confused AAOx1 Respiratory system: Clear to auscultation. Respiratory effort normal. Cardiovascular system: S1 & S2 heard, RRR. No JVD, murmurs, rubs, gallops or clicks. No pedal edema. Gastrointestinal system: Abdomen is nondistended, soft and  nontender. No organomegaly or masses felt. Normal bowel sounds heard. Central nervous system: Alert and oriented. No focal neurological deficits. Extremities: Symmetric 5 x 5 power. Skin: Left lower extremity area of gangrene noted with eschar, left first metatarsal and the fifth toe. Psychiatry: Judgement and insight appear normal. Mood & affect appropriate.     Data  Reviewed:   CBC: Recent Labs  Lab 11/26/17 0817 11/26/17 1140 11/27/17 0719  WBC 20.5* 17.8* 12.2*  NEUTROABS 16.7*  --   --   HGB 11.1* 10.3* 9.0*  HCT 34.0* 31.4* 27.1*  MCV 105.6* 106.4* 105.4*  PLT 138* 123* 188*   Basic Metabolic Panel: Recent Labs  Lab 11/26/17 0817 11/26/17 1140 11/27/17 0719  NA 143  --  145  K 3.0*  --  3.7  CL 105  --  113*  CO2 26  --  18*  GLUCOSE 122*  --  126*  BUN 32*  --  32*  CREATININE 1.78* 1.72* 1.46*  CALCIUM 9.2  --  7.9*   GFR: Estimated Creatinine Clearance: 32.2 mL/min (A) (by C-G formula based on SCr of 1.46 mg/dL (H)). Liver Function Tests: Recent Labs  Lab 11/26/17 0817  AST 25  ALT 13*  ALKPHOS 66  BILITOT 1.1  PROT 5.5*  ALBUMIN 2.9*   No results for input(s): LIPASE, AMYLASE in the last 168 hours. No results for input(s): AMMONIA in the last 168 hours. Coagulation Profile: Recent Labs  Lab 11/26/17 1140  INR 1.24   Cardiac Enzymes: No results for input(s): CKTOTAL, CKMB, CKMBINDEX, TROPONINI in the last 168 hours. BNP (last 3 results) No results for input(s): PROBNP in the last 8760 hours. HbA1C: No results for input(s): HGBA1C in the last 72 hours. CBG: Recent Labs  Lab 11/26/17 1411  GLUCAP 118*   Lipid Profile: No results for input(s): CHOL, HDL, LDLCALC, TRIG, CHOLHDL, LDLDIRECT in the last 72 hours. Thyroid Function Tests: No results for input(s): TSH, T4TOTAL, FREET4, T3FREE, THYROIDAB in the last 72 hours. Anemia Panel: No results for input(s): VITAMINB12, FOLATE, FERRITIN, TIBC, IRON, RETICCTPCT in the last 72 hours. Sepsis Labs: Recent Labs  Lab 11/26/17 0835 11/26/17 1140  PROCALCITON  --  2.49  LATICACIDVEN 1.64 1.8    Recent Results (from the past 240 hour(s))  Blood culture (routine x 2)     Status: None (Preliminary result)   Collection Time: 11/26/17  8:17 AM  Result Value Ref Range Status   Specimen Description BLOOD RIGHT ANTECUBITAL  Final   Special Requests IN  PEDIATRIC BOTTLE Blood Culture adequate volume  Final   Culture NO GROWTH 1 DAY  Final   Report Status PENDING  Incomplete  Blood culture (routine x 2)     Status: None (Preliminary result)   Collection Time: 11/26/17  8:25 AM  Result Value Ref Range Status   Specimen Description BLOOD RIGHT FOREARM  Final   Special Requests   Final    BOTTLES DRAWN AEROBIC AND ANAEROBIC Blood Culture adequate volume   Culture NO GROWTH 1 DAY  Final   Report Status PENDING  Incomplete  Urine culture     Status: None   Collection Time: 11/26/17 10:26 AM  Result Value Ref Range Status   Specimen Description URINE, CATHETERIZED  Final   Special Requests NONE  Final   Culture NO GROWTH  Final   Report Status 11/27/2017 FINAL  Final         Radiology Studies: No results found.  Scheduled Meds: . heparin injection (subcutaneous)  5,000 Units Subcutaneous Q8H  . hydrocortisone sod succinate (SOLU-CORTEF) inj  50 mg Intravenous Q6H   Continuous Infusions: . 0.9 % NaCl with KCl 20 mEq / L 75 mL/hr at 11/27/17 1741  . ceFEPime (MAXIPIME) IV Stopped (11/28/17 0959)  . metronidazole Stopped (11/28/17 0715)  . vancomycin 1,000 mg (11/28/17 1033)     LOS: 2 days    Time spent: 30 mins    Delane Stalling Arsenio Loader, MD [name] Pager [phone]   If 7PM-7AM, please contact night-coverage [email or web address] Password [facility] 11/28/2017, 11:33 AM

## 2017-11-28 NOTE — Progress Notes (Signed)
Patient yelling out ,"Hello,hello police ,police" repeatedly.When questioned patient why he was yelling out.Patient stated,"I don't know". Patient was given bath by nurse tech and medicated for pain recently.This nurse asked patient to stop yelling because disturbing other patient close to him.Patient stated," Good"then started smiling. Patient stated ," I like to yell out." No acute distress noted with this patient will continue to monitor.

## 2017-11-28 NOTE — Progress Notes (Signed)
Pharmacy Antibiotic Note  Barry Wright is a 84 y.o. male admitted on 11/26/2017 with sepsis, left foot gangrene with possible osteomyelitis as source. Pharmacy consulted to resume Vancomycin.    WBC is trending down. Patient is afebrile.  Patient has CKD stage 3 - last SCr on 1/18 was 1.46 (decreased). UOP was charted low on 1/18 - unsure if accurate.   Plan: Continue Cefepime 1 g IV every 24 hours.  Restart Vancomycin 1 g IV every 24 hours.  Continue Flagyl 500 mg IV every 8 hours per MD. [place] BMET in AM to assess.  Monitor renal function, culture results, and clinical status.   Height: 5\' 11"  (180.3 cm) Weight: 184 lb (83.5 kg) IBW/kg (Calculated) : 75.3  Temp (24hrs), Avg:97.7 F (36.5 C), Min:97.4 F (36.3 C), Max:98 F (36.7 C)  Recent Labs  Lab 11/26/17 0817 11/26/17 0835 11/26/17 1140 11/27/17 0719  WBC 20.5*  --  17.8* 12.2*  CREATININE 1.78*  --  1.72* 1.46*  LATICACIDVEN  --  1.64 1.8  --     Estimated Creatinine Clearance: 32.2 mL/min (A) (by C-G formula based on SCr of 1.46 mg/dL (H)).    Allergies  Allergen Reactions  . Penicillins Rash and Other (See Comments)    Has patient had a PCN reaction causing immediate rash, facial/tongue/throat swelling, SOB or lightheadedness with hypotension: Unknown Has patient had a PCN reaction causing severe rash involving mucus membranes or skin necrosis: Unknown Has patient had a PCN reaction that required hospitalization: Unknown Has patient had a PCN reaction occurring within the last 10 years: Unknown If all of the above answers are "NO", then may proceed with Cephalosporin use.    Antimicrobials this admission:  Cefepime 1/17> Vancomycin 1/17>1/18; 1/19 >> Levoflox 1/17>>1/18 Flagyl 1/18>>  Dose adjustments this admission:    Microbiology results:  1/17 flu ip 1/17 blood x 2 1/17 urine - negative   Sloan Leiter, PharmD, BCPS, BCCCP Clinical Pharmacist Clinical phone 11/28/2017 until 3:30PM-  #[phone] After hours, please call #[phone] 11/28/2017 9:37 AM

## 2017-11-29 ENCOUNTER — Encounter (HOSPITAL_COMMUNITY): Payer: Self-pay | Admitting: *Deleted

## 2017-11-29 LAB — BASIC METABOLIC PANEL
ANION GAP: 11 (ref 5–15)
BUN: 36 mg/dL — ABNORMAL HIGH (ref 6–20)
CALCIUM: 7.6 mg/dL — AB (ref 8.9–10.3)
CO2: 18 mmol/L — AB (ref 22–32)
Chloride: 113 mmol/L — ABNORMAL HIGH (ref 101–111)
Creatinine, Ser: 1.58 mg/dL — ABNORMAL HIGH (ref 0.61–1.24)
GFR calc non Af Amer: 35 mL/min — ABNORMAL LOW (ref >60)
GFR, EST AFRICAN AMERICAN: 41 mL/min — AB (ref >60)
Glucose, Bld: 174 mg/dL — ABNORMAL HIGH (ref 65–99)
Potassium: 3.8 mmol/L (ref 3.5–5.1)
SODIUM: 142 mmol/L (ref 135–145)

## 2017-11-29 NOTE — Progress Notes (Signed)
PROGRESS NOTE    Barry Wright  GYJ:[id number] DOB: 11-05-1920 DOA: 11/26/2017 PCP: Lajean Manes, MD   Brief Narrative:   84 year old with a history of left foot gangrene followed by podiatry, advanced Parkinson's disease, CVA, CAD status post CABG, stage III kidney disease, hypothyroidism, hypercholesterolemia and nephrectomy was sent to the hospital for evaluation of change in mental status, fever and hypotension.  He was admitted for sepsis secondary to left foot gangrene.  He was started on broad-spectrum antibiotics.  Assessment & Plan:   Active Problems:   Chronic kidney disease (CKD), stage III (moderate) (HCC)   Polymyalgia rheumatica (HCC)   Carotid artery disease (HCC)   Hypokalemia   Essential hypertension, benign   Hypothyroidism   Physical deconditioning   Chronic diastolic heart failure (HCC)   Sepsis (HCC)   Gangrene of left foot (HCC)   Parkinson disease (HCC)  Sepsis secondary to left foot gangrene; stable  -Cultures have thus far remain negative, continue vancomycin, cefepime and Flagyl -MRI foot shows -suspicion for myositis/cellulitis.  Nonspecific marrow changes of multiple bones in the foot -Wound care team is following -Patient may need surgical amputation.  I have attempted to call patient's daughter Neoma Laming with no response.  Yesterday I did speak with the family that they need to make a decision in case of surgical amputation is needed, family to still decide.  If family would like to proceed then we will need orthopedic consultation  Acute delirium, improved Advanced dementia -Could be from underlying infection versus pain versus being at unfamiliar place.  At this time will avoid giving him extra medication and try and use redirection method in the morning.  Currently appears to be very calm.  History of polymyalgia rheumatica -Methotrexate and prednisone at home.  Blood pressure stable but if it drops he will need stress dose steroids  Chronic  diastolic and systolic heart failure, ejection fraction 45% in 2015 -Clinically slightly dehydrated therefore holding Lasix, give him gentle hydration  Hypothyroidism -Continue Synthroid  Anemia chronic disease -No signs of acute blood loss or active blood loss at this time.  Hemoglobin is stable  Coronary artery disease/peripheral vascular disease -On Eliquis  Peripheral neuropathy -Restart gabapentin  DVT prophylaxis: Eliquis Code Status: DO NOT RESUSCITATE Family Communication: Called the patient's daughter Neoma Laming but no response. Disposition Plan: To be determined    Subjective: No acute events overnight.  Patient remains afebrile.  Unable to provide me with any information due to his advanced dementia  Objective: Vitals:   11/28/17 0800 11/28/17 1525 11/28/17 2224 11/29/17 0442  BP: (!) 128/49 (!) 116/48 (!) 145/57 (!) 121/59  Pulse: (!) 58 (!) 59 (!) 59 (!) 51  Resp: 18 17 16 16   Temp: 97.6 F (36.4 C) 99.6 F (37.6 C) (!) 97.5 F (36.4 C) 97.8 F (36.6 C)  TempSrc: Oral Oral Oral Axillary  SpO2: 97% 98% 96% 98%  Weight:      Height:        Intake/Output Summary (Last 24 hours) at 11/29/2017 1127 Last data filed at 11/29/2017 2637 Gross per 24 hour  Intake 2861 ml  Output 600 ml  Net 2261 ml   Filed Weights   11/26/17 0742  Weight: 83.5 kg (184 lb)    Examination:  General exam: Appears calm and comfortable, pleasantly confused AAOx1 Respiratory system: Clear to auscultation. Respiratory effort normal. Cardiovascular system: S1 & S2 heard, RRR. No JVD, murmurs, rubs, gallops or clicks. No pedal edema. Gastrointestinal system: Abdomen is nondistended, soft  and nontender. No organomegaly or masses felt. Normal bowel sounds heard. Central nervous system: Alert and oriented. No focal neurological deficits. Extremities: Symmetric 5 x 5 power. Skin: Left lower extremity area of gangrene noted with eschar, left first metatarsal and the fifth  toe. Psychiatry: Judgement and insight appear normal. Mood & affect appropriate.     Data Reviewed:   CBC: Recent Labs  Lab 11/26/17 0817 11/26/17 1140 11/27/17 0719  WBC 20.5* 17.8* 12.2*  NEUTROABS 16.7*  --   --   HGB 11.1* 10.3* 9.0*  HCT 34.0* 31.4* 27.1*  MCV 105.6* 106.4* 105.4*  PLT 138* 123* 979*   Basic Metabolic Panel: Recent Labs  Lab 11/26/17 0817 11/26/17 1140 11/27/17 0719  NA 143  --  145  K 3.0*  --  3.7  CL 105  --  113*  CO2 26  --  18*  GLUCOSE 122*  --  126*  BUN 32*  --  32*  CREATININE 1.78* 1.72* 1.46*  CALCIUM 9.2  --  7.9*   GFR: Estimated Creatinine Clearance: 32.2 mL/min (A) (by C-G formula based on SCr of 1.46 mg/dL (H)). Liver Function Tests: Recent Labs  Lab 11/26/17 0817  AST 25  ALT 13*  ALKPHOS 66  BILITOT 1.1  PROT 5.5*  ALBUMIN 2.9*   No results for input(s): LIPASE, AMYLASE in the last 168 hours. No results for input(s): AMMONIA in the last 168 hours. Coagulation Profile: Recent Labs  Lab 11/26/17 1140  INR 1.24   Cardiac Enzymes: No results for input(s): CKTOTAL, CKMB, CKMBINDEX, TROPONINI in the last 168 hours. BNP (last 3 results) No results for input(s): PROBNP in the last 8760 hours. HbA1C: No results for input(s): HGBA1C in the last 72 hours. CBG: Recent Labs  Lab 11/26/17 1411  GLUCAP 118*   Lipid Profile: No results for input(s): CHOL, HDL, LDLCALC, TRIG, CHOLHDL, LDLDIRECT in the last 72 hours. Thyroid Function Tests: No results for input(s): TSH, T4TOTAL, FREET4, T3FREE, THYROIDAB in the last 72 hours. Anemia Panel: No results for input(s): VITAMINB12, FOLATE, FERRITIN, TIBC, IRON, RETICCTPCT in the last 72 hours. Sepsis Labs: Recent Labs  Lab 11/26/17 0835 11/26/17 1140  PROCALCITON  --  2.49  LATICACIDVEN 1.64 1.8    Recent Results (from the past 240 hour(s))  Blood culture (routine x 2)     Status: None (Preliminary result)   Collection Time: 11/26/17  8:17 AM  Result Value Ref  Range Status   Specimen Description BLOOD RIGHT ANTECUBITAL  Final   Special Requests IN PEDIATRIC BOTTLE Blood Culture adequate volume  Final   Culture NO GROWTH 2 DAYS  Final   Report Status PENDING  Incomplete  Blood culture (routine x 2)     Status: None (Preliminary result)   Collection Time: 11/26/17  8:25 AM  Result Value Ref Range Status   Specimen Description BLOOD RIGHT FOREARM  Final   Special Requests   Final    BOTTLES DRAWN AEROBIC AND ANAEROBIC Blood Culture adequate volume   Culture NO GROWTH 2 DAYS  Final   Report Status PENDING  Incomplete  Urine culture     Status: None   Collection Time: 11/26/17 10:26 AM  Result Value Ref Range Status   Specimen Description URINE, CATHETERIZED  Final   Special Requests NONE  Final   Culture NO GROWTH  Final   Report Status 11/27/2017 FINAL  Final         Radiology Studies: Mr Foot Left Wo Contrast  Result  Date: 11/29/2017 CLINICAL DATA:  84 year old admitted with sepsis, left foot gangrene and possible osteomyelitis. History stage 3 kidney disease, Parkinson's disease and altered mental status. EXAM: MRI OF THE LEFT FOOT WITHOUT CONTRAST TECHNIQUE: Multiplanar, multisequence MR imaging of the left forefoot was performed. No intravenous contrast was administered. COMPARISON:  None. FINDINGS: Despite efforts by the technologist and patient, moderate motion artifact is present on today's exam and could not be eliminated. This reduces exam sensitivity and specificity. Bones/Joint/Cartilage T1 weighted images demonstrate nonspecific signal changes in the neck of the 2nd proximal phalanx, the distal 4th phalanx and the middle 5th phalanx. These digits are suboptimally evaluated on the T2 weighted images due to motion. There is nonspecific decreased T1 and increased T2 signal medially in the head of the 1st metatarsal without adjacent joint effusion or focal soft tissue abnormality. No other significant arthropathy or joint effusion  demonstrated. Ligaments The Lisfranc ligament is intact. Muscles and Tendons Mild generalized forefoot muscular edema. No focal fluid collection or significant tenosynovitis. Soft tissues Generalized forefoot soft tissue edema, especially in the dorsal subcutaneous fat. No focal fluid collection or skin ulceration identified. IMPRESSION: 1. Generalized soft tissue edema in the forefoot suspicious for cellulitis/myositis. No focal abscess or skin ulceration identified. 2. Nonspecific marrow changes are demonstrated in multiple bones, including the head of the 1st metatarsal (likely arthropathic), the neck of the 2nd proximal phalanx, the distal 4th phalanx and middle 5th phalanx. Plain film correlation recommended as evaluated is limited by motion on this study. Electronically Signed   By: Richardean Sale M.D.   On: 11/29/2017 09:15        Scheduled Meds: . apixaban  2.5 mg Oral BID  . folic acid  1 mg Oral Daily  . gabapentin  200 mg Oral TID  . hydrocortisone sod succinate (SOLU-CORTEF) inj  50 mg Intravenous Q6H  . levothyroxine  100 mcg Oral QAC breakfast   Continuous Infusions: . 0.9 % NaCl with KCl 20 mEq / L 75 mL/hr at 11/27/17 1741  . ceFEPime (MAXIPIME) IV Stopped (11/29/17 0933)  . metronidazole Stopped (11/29/17 0900)  . vancomycin Stopped (11/29/17 1039)     LOS: 3 days    Time spent: 30 mins    Ankit Arsenio Loader, MD [name] Pager [phone]   If 7PM-7AM, please contact night-coverage [email or web address] Password [facility] 11/29/2017, 11:27 AM

## 2017-11-29 NOTE — Progress Notes (Signed)
Patient was unable to receive MRI on last night. Patient was uncooperative during diagnostic attempt. Patient may need sedative before diagnostic.

## 2017-11-30 DIAGNOSIS — I10 Essential (primary) hypertension: Secondary | ICD-10-CM

## 2017-11-30 DIAGNOSIS — N179 Acute kidney failure, unspecified: Secondary | ICD-10-CM

## 2017-11-30 DIAGNOSIS — I5032 Chronic diastolic (congestive) heart failure: Secondary | ICD-10-CM

## 2017-11-30 DIAGNOSIS — A419 Sepsis, unspecified organism: Principal | ICD-10-CM

## 2017-11-30 DIAGNOSIS — E876 Hypokalemia: Secondary | ICD-10-CM

## 2017-11-30 MED ORDER — HYDROCORTISONE NA SUCCINATE PF 100 MG IJ SOLR
50.0000 mg | Freq: Two times a day (BID) | INTRAMUSCULAR | Status: DC
  Administered 2017-11-30: 50 mg via INTRAVENOUS
  Filled 2017-11-30 (×2): qty 1

## 2017-11-30 NOTE — Progress Notes (Signed)
Pharmacy Antibiotic Note  Barry Wright is a 84 y.o. male admitted on 11/26/2017 with sepsis, left foot gangrene with possible osteomyelitis as source.  Pharmacy consulted for vancomycin dosing.  He is also on cefepime and Flagyyl.  Patient has CKD3 and his renal function is stabilizing.  Afebrile, WBC down 12.2, LA  1.8, PCT 2.49.   Plan: Continue vanc 1gm IV Q24H Cefepime 1gm IV Q12H per MD Monitor renal fxn, clinical progress, vanc trough as indicated  Consider tapering steroid back to prednisone 5mg  PO daily   Height: 5\' 11"  (180.3 cm) Weight: 184 lb (83.5 kg) IBW/kg (Calculated) : 75.3  Temp (24hrs), Avg:97.7 F (36.5 C), Min:97.5 F (36.4 C), Max:97.9 F (36.6 C)  Recent Labs  Lab 11/26/17 0817 11/26/17 0835 11/26/17 1140 11/27/17 0719 11/29/17 1026  WBC 20.5*  --  17.8* 12.2*  --   CREATININE 1.78*  --  1.72* 1.46* 1.58*  LATICACIDVEN  --  1.64 1.8  --   --     Estimated Creatinine Clearance: 29.8 mL/min (A) (by C-G formula based on SCr of 1.58 mg/dL (H)).    Allergies  Allergen Reactions  . Penicillins Rash and Other (See Comments)    Has patient had a PCN reaction causing immediate rash, facial/tongue/throat swelling, SOB or lightheadedness with hypotension: Unknown Has patient had a PCN reaction causing severe rash involving mucus membranes or skin necrosis: Unknown Has patient had a PCN reaction that required hospitalization: Unknown Has patient had a PCN reaction occurring within the last 10 years: Unknown If all of the above answers are "NO", then may proceed with Cephalosporin use.      Cefepime 1/17> Vanc 1/17>1/18; 1/19 >> Flagyl 1/18 >> LVQ 1/17>>1/18  1/17 flu - negative 1/17 BCx - NGTD 1/17 UCx - negative   Barry Wright D. Mina Marble, PharmD, BCPS Pager:  [phone] 11/30/2017, 10:10 AM

## 2017-11-30 NOTE — Consult Note (Signed)
Reason for Consult:Left foot osteo Referring Physician: [name] is an 84 y.o. male.  HPI: Barry Wright was admitted with sepsis on 1/17. This was likely secondary to left foot gangrene with early osteo. He has improved on IV abx. He denies pain in the foot. Because of the possible osteo orthopedic surgery was consulted for possible amputation. Pt and wife are quite upset about options, choices their dtr (POA) has made, care at [facility] where they reside.  Past Medical History:  Diagnosis Date  . CAD (coronary artery disease)   . CHF (congestive heart failure) ([place])   . Chronic back pain   . Chronic kidney disease (CKD), stage III (moderate) (HCC)   . History of nephrectomy   . Hx of CABG   . Hypercholesteremia   . Hypothyroidism   . Internal carotid artery stenosis   . Left bundle branch block   . Myocardial infarction ([place])   . Polymyalgia rheumatica ([place])   . Stroke [facility])     Past Surgical History:  Procedure Laterality Date  . CHOLECYSTECTOMY    . CORONARY ARTERY BYPASS GRAFT    . KIDNEY SURGERY     kidney removed  . NEPHRECTOMY      Family History  Problem Relation Age of Onset  . Other Mother        [place] Flu  . Heart disease Father     Social History:  reports that he quit smoking about 47 years ago. His smoking use included cigarettes. he has never used smokeless tobacco. He reports that he does not drink alcohol or use drugs.  Allergies:  Allergies  Allergen Reactions  . Penicillins Rash and Other (See Comments)    Has patient had a PCN reaction causing immediate rash, facial/tongue/throat swelling, SOB or lightheadedness with hypotension: Unknown Has patient had a PCN reaction causing severe rash involving mucus membranes or skin necrosis: Unknown Has patient had a PCN reaction that required hospitalization: Unknown Has patient had a PCN reaction occurring within the last 10 years: Unknown If all of the above answers are "NO", then may proceed with  Cephalosporin use.     Medications: I have reviewed the patient's current medications.  Results for orders placed or performed during the hospital encounter of 11/26/17 (from the past 48 hour(s))  Basic metabolic panel     Status: Abnormal   Collection Time: 11/29/17 10:26 AM  Result Value Ref Range   Sodium 142 135 - 145 mmol/L   Potassium 3.8 3.5 - 5.1 mmol/L   Chloride 113 (H) 101 - 111 mmol/L   CO2 18 (L) 22 - 32 mmol/L   Glucose, Bld 174 (H) 65 - 99 mg/dL   BUN 36 (H) 6 - 20 mg/dL   Creatinine, Ser 1.58 (H) 0.61 - 1.24 mg/dL   Calcium 7.6 (L) 8.9 - 10.3 mg/dL   GFR calc non Af Amer 35 (L) >60 mL/min   GFR calc Af Amer 41 (L) >60 mL/min    Comment: (NOTE) The eGFR has been calculated using the CKD EPI equation. This calculation has not been validated in all clinical situations. eGFR's persistently <60 mL/min signify possible Chronic Kidney Disease.    Anion gap 11 5 - 15    Mr Foot Left Wo Contrast  Result Date: 11/29/2017 CLINICAL DATA:  84 year old admitted with sepsis, left foot gangrene and possible osteomyelitis. History stage 3 kidney disease, Parkinson's disease and altered mental status. EXAM: MRI OF THE LEFT FOOT WITHOUT CONTRAST TECHNIQUE:  Multiplanar, multisequence MR imaging of the left forefoot was performed. No intravenous contrast was administered. COMPARISON:  None. FINDINGS: Despite efforts by the technologist and patient, moderate motion artifact is present on today's exam and could not be eliminated. This reduces exam sensitivity and specificity. Bones/Joint/Cartilage T1 weighted images demonstrate nonspecific signal changes in the neck of the 2nd proximal phalanx, the distal 4th phalanx and the middle 5th phalanx. These digits are suboptimally evaluated on the T2 weighted images due to motion. There is nonspecific decreased T1 and increased T2 signal medially in the head of the 1st metatarsal without adjacent joint effusion or focal soft tissue abnormality. No  other significant arthropathy or joint effusion demonstrated. Ligaments The Lisfranc ligament is intact. Muscles and Tendons Mild generalized forefoot muscular edema. No focal fluid collection or significant tenosynovitis. Soft tissues Generalized forefoot soft tissue edema, especially in the dorsal subcutaneous fat. No focal fluid collection or skin ulceration identified. IMPRESSION: 1. Generalized soft tissue edema in the forefoot suspicious for cellulitis/myositis. No focal abscess or skin ulceration identified. 2. Nonspecific marrow changes are demonstrated in multiple bones, including the head of the 1st metatarsal (likely arthropathic), the neck of the 2nd proximal phalanx, the distal 4th phalanx and middle 5th phalanx. Plain film correlation recommended as evaluated is limited by motion on this study. Electronically Signed   By: Richardean Sale M.D.   On: 11/29/2017 09:15    Review of Systems  Constitutional: Negative for weight loss.  HENT: Negative for ear discharge, ear pain, hearing loss and tinnitus.   Eyes: Negative for blurred vision, double vision, photophobia and pain.  Respiratory: Negative for cough, sputum production and shortness of breath.   Cardiovascular: Negative for chest pain.  Gastrointestinal: Negative for abdominal pain, nausea and vomiting.  Genitourinary: Negative for dysuria, flank pain, frequency and urgency.  Musculoskeletal: Negative for back pain, falls, joint pain, myalgias and neck pain.  Neurological: Negative for dizziness, tingling, sensory change, focal weakness, loss of consciousness and headaches.  Endo/Heme/Allergies: Does not bruise/bleed easily.  Psychiatric/Behavioral: Negative for depression, memory loss and substance abuse. The patient is not nervous/anxious.    Blood pressure (!) 129/48, pulse (!) 49, temperature (!) 97.5 F (36.4 C), temperature source Axillary, resp. rate 16, height _0  (1.803 m), weight 83.5 kg (184 lb), SpO2 98 %. Physical  Exam  Constitutional: He appears well-developed and well-nourished. No distress.  HENT:  Head: Normocephalic and atraumatic.  Eyes: Conjunctivae are normal. Right eye exhibits no discharge. Left eye exhibits no discharge. No scleral icterus.  Neck: Normal range of motion.  Cardiovascular: Normal rate and regular rhythm.  Respiratory: Effort normal. No respiratory distress.  Musculoskeletal:  LLE No traumatic wounds, ecchymosis, or rash  Nontender, necrotic areas over MTP joint and 3-5th toes  No knee or ankle effusion  Knee stable to varus/ valgus and anterior/posterior stress  Sens DPN, SPN, TN intact  Motor EHL, ext, flex, evers 3/5  DP 0, PT 0, No significant edema  Neurological: He is alert.  Skin: Skin is warm and dry. He is not diaphoretic.  Psychiatric: His behavior is normal. His affect is labile.    Assessment/Plan: Left foot ischemia -- There are no great options as I see them. Option #1 would be to do nothing as he is asymptomatic and let him return to SNF. He will continue to have wound issues and the ischemia will likely progress. It may start to cause pain in the future. This will likely result in further episodes of sepsis  and hospitalizations. Option #2 would involve some sort of revascularization procedure. He was evaluated by cardiology (Dr. Gwenlyn Found) in 11/18 and was deemed not a candidate for endovascular therapy but it seemed that was because of the risk of surgery and not because it wouldn't work. Option #3 is BKA/AKA. If family did not elect option #1 then I think option #2 should be considered at this point before we proceed to option #3 (or possibly in conjuncture). I have asked the palliative care team to assess patient and help him and wife with these end of life decisions. It may be worthwhile to have cardiology weigh in on risk of procedures knowing recurrent bouts of sepsis are possible. Dr. Ninfa Linden to evaluate later today.    Lisette Abu, PA-C Orthopedic  Surgery [phone] 11/30/2017, 1:48 PM

## 2017-11-30 NOTE — Progress Notes (Signed)
PROGRESS NOTE    Barry Wright  ERD:[id number] DOB: [birth year] DOA: 11/26/2017 PCP: Lajean Manes, MD   Brief Narrative:   84 year old with a history of left foot gangrene followed by podiatry, advanced Parkinson's disease, CVA, CAD status post CABG, stage III kidney disease, hypothyroidism, hypercholesterolemia and nephrectomy was sent to the hospital for evaluation of change in mental status, fever and hypotension.  He was admitted for sepsis secondary to left foot gangrene.  He was started on broad-spectrum antibiotics.  Assessment & Plan:   Active Problems:   Chronic kidney disease (CKD), stage III (moderate) (HCC)   Polymyalgia rheumatica (HCC)   Carotid artery disease (HCC)   Hypokalemia   Essential hypertension, benign   Hypothyroidism   Physical deconditioning   Chronic diastolic heart failure (HCC)   Sepsis (HCC)   Gangrene of left foot (HCC)   Parkinson disease (HCC)  Sepsis secondary to left foot gangrene and osteomyelitis: Complicated by PVD.  - Continue broad empiric coverage (vancomycin, cefepime, flagyl) - Monitor cultures - Orthopedics consulted for consideration of amputation. I have been unable to discuss with pt's HCPOA today. Palliative care was also consulted for [place] discussions by orthopedics. I consulted cardiology for operative risk assessment.   Peripheral vascular disease: Right ABI 0.73, left TBI 0.32. Dr. Kennon Holter clinic note in Nov 2018 stated endovascular intervention was too high risk.  - Will ask cardiology to weigh in regarding his risk going forward with amputation and/or revascularization.    Acute delirium: Due to acute illness/infection.  - Delirium precautions reviewed.  History of polymyalgia rheumatica - Holding MTX, prednisone. Taper stress steroids to solumedrol 50mg  q12h.   Chronic diastolic and systolic heart failure: Appears mildly hypovolemic, last EF 45% in 2015.  - Holding lasix for now.   Hypothyroidism - Restart  synthroid.  Anemia chronic disease: No signs of acute blood loss or active blood loss at this time. - Monitor hgb.   Coronary artery disease/peripheral vascular disease - Restarted eliquis  Peripheral neuropathy - Restart gabapentin  DVT prophylaxis: Eliquis Code Status: DO NOT RESUSCITATE Family Communication: Called daughter without answer at [phone], number given by SIL. Disposition Plan: To be determined  Subjective: No new events, pt visiting with family friend from church at bedside. No agitation noted. Denies fever. Initially denies pain, but when pointedly asked about pain in his foot agrees that it is painful. Does not walk.  Objective: Vitals:   11/29/17 1345 11/29/17 2202 11/30/17 0643 11/30/17 1358  BP: (!) 118/58 (!) 132/56 (!) 129/48 (!) 146/53  Pulse: (!) 57 (!) 52 (!) 49 62  Resp: 16 16 16 17   Temp: 97.7 F (36.5 C) 97.9 F (36.6 C) (!) 97.5 F (36.4 C) 98.7 F (37.1 C)  TempSrc: Oral Axillary Axillary Oral  SpO2: 98% 99% 98% 98%  Weight:      Height:        Intake/Output Summary (Last 24 hours) at 11/30/2017 1546 Last data filed at 11/30/2017 1359 Gross per 24 hour  Intake 480 ml  Output 550 ml  Net -70 ml   Filed Weights   11/26/17 0742  Weight: 83.5 kg (184 lb)    Examination:  Gen: Elderly male in NAD HEENT: MMM, posterior oropharynx clear Pulm: Non-labored; CTAB, no wheezes  CV: Regular rate, no murmur appreciated; distal pulses intact/symmetric GI: + BS; soft, non-tender, non-distended Skin: Left 5th toe is gangrenous with black eschar and malodorous purulent discharge. Neuro: Alert, very HOH, actually oriented times 3, but unable to comprehend  complex medical decision making issues. No focal deficits noted.    Data Reviewed:   CBC: Recent Labs  Lab 11/26/17 0817 11/26/17 1140 11/27/17 0719  WBC 20.5* 17.8* 12.2*  NEUTROABS 16.7*  --   --   HGB 11.1* 10.3* 9.0*  HCT 34.0* 31.4* 27.1*  MCV 105.6* 106.4* 105.4*  PLT 138*  123* 194*   Basic Metabolic Panel: Recent Labs  Lab 11/26/17 0817 11/26/17 1140 11/27/17 0719 11/29/17 1026  NA 143  --  145 142  K 3.0*  --  3.7 3.8  CL 105  --  113* 113*  CO2 26  --  18* 18*  GLUCOSE 122*  --  126* 174*  BUN 32*  --  32* 36*  CREATININE 1.78* 1.72* 1.46* 1.58*  CALCIUM 9.2  --  7.9* 7.6*   GFR: Estimated Creatinine Clearance: 29.8 mL/min (A) (by C-G formula based on SCr of 1.58 mg/dL (H)). Liver Function Tests: Recent Labs  Lab 11/26/17 0817  AST 25  ALT 13*  ALKPHOS 66  BILITOT 1.1  PROT 5.5*  ALBUMIN 2.9*   No results for input(s): LIPASE, AMYLASE in the last 168 hours. No results for input(s): AMMONIA in the last 168 hours. Coagulation Profile: Recent Labs  Lab 11/26/17 1140  INR 1.24   Cardiac Enzymes: No results for input(s): CKTOTAL, CKMB, CKMBINDEX, TROPONINI in the last 168 hours. BNP (last 3 results) No results for input(s): PROBNP in the last 8760 hours. HbA1C: No results for input(s): HGBA1C in the last 72 hours. CBG: Recent Labs  Lab 11/26/17 1411  GLUCAP 118*   Lipid Profile: No results for input(s): CHOL, HDL, LDLCALC, TRIG, CHOLHDL, LDLDIRECT in the last 72 hours. Thyroid Function Tests: No results for input(s): TSH, T4TOTAL, FREET4, T3FREE, THYROIDAB in the last 72 hours. Anemia Panel: No results for input(s): VITAMINB12, FOLATE, FERRITIN, TIBC, IRON, RETICCTPCT in the last 72 hours. Sepsis Labs: Recent Labs  Lab 11/26/17 0835 11/26/17 1140  PROCALCITON  --  2.49  LATICACIDVEN 1.64 1.8    Recent Results (from the past 240 hour(s))  Blood culture (routine x 2)     Status: None (Preliminary result)   Collection Time: 11/26/17  8:17 AM  Result Value Ref Range Status   Specimen Description BLOOD RIGHT ANTECUBITAL  Final   Special Requests IN PEDIATRIC BOTTLE Blood Culture adequate volume  Final   Culture NO GROWTH 3 DAYS  Final   Report Status PENDING  Incomplete  Blood culture (routine x 2)     Status: None  (Preliminary result)   Collection Time: 11/26/17  8:25 AM  Result Value Ref Range Status   Specimen Description BLOOD RIGHT FOREARM  Final   Special Requests   Final    BOTTLES DRAWN AEROBIC AND ANAEROBIC Blood Culture adequate volume   Culture NO GROWTH 3 DAYS  Final   Report Status PENDING  Incomplete  Urine culture     Status: None   Collection Time: 11/26/17 10:26 AM  Result Value Ref Range Status   Specimen Description URINE, CATHETERIZED  Final   Special Requests NONE  Final   Culture NO GROWTH  Final   Report Status 11/27/2017 FINAL  Final         Radiology Studies: Mr Foot Left Wo Contrast  Result Date: 11/29/2017 CLINICAL DATA:  84 year old admitted with sepsis, left foot gangrene and possible osteomyelitis. History stage 3 kidney disease, Parkinson's disease and altered mental status. EXAM: MRI OF THE LEFT FOOT WITHOUT CONTRAST TECHNIQUE: Multiplanar,  multisequence MR imaging of the left forefoot was performed. No intravenous contrast was administered. COMPARISON:  None. FINDINGS: Despite efforts by the technologist and patient, moderate motion artifact is present on today's exam and could not be eliminated. This reduces exam sensitivity and specificity. Bones/Joint/Cartilage T1 weighted images demonstrate nonspecific signal changes in the neck of the 2nd proximal phalanx, the distal 4th phalanx and the middle 5th phalanx. These digits are suboptimally evaluated on the T2 weighted images due to motion. There is nonspecific decreased T1 and increased T2 signal medially in the head of the 1st metatarsal without adjacent joint effusion or focal soft tissue abnormality. No other significant arthropathy or joint effusion demonstrated. Ligaments The Lisfranc ligament is intact. Muscles and Tendons Mild generalized forefoot muscular edema. No focal fluid collection or significant tenosynovitis. Soft tissues Generalized forefoot soft tissue edema, especially in the dorsal subcutaneous fat.  No focal fluid collection or skin ulceration identified. IMPRESSION: 1. Generalized soft tissue edema in the forefoot suspicious for cellulitis/myositis. No focal abscess or skin ulceration identified. 2. Nonspecific marrow changes are demonstrated in multiple bones, including the head of the 1st metatarsal (likely arthropathic), the neck of the 2nd proximal phalanx, the distal 4th phalanx and middle 5th phalanx. Plain film correlation recommended as evaluated is limited by motion on this study. Electronically Signed   By: Richardean Sale M.D.   On: 11/29/2017 09:15        Scheduled Meds: . apixaban  2.5 mg Oral BID  . folic acid  1 mg Oral Daily  . gabapentin  200 mg Oral TID  . hydrocortisone sod succinate (SOLU-CORTEF) inj  50 mg Intravenous Q6H  . levothyroxine  100 mcg Oral QAC breakfast   Continuous Infusions: . 0.9 % NaCl with KCl 20 mEq / L 75 mL/hr at 11/27/17 1741  . ceFEPime (MAXIPIME) IV Stopped (11/30/17 1241)  . metronidazole Stopped (11/30/17 1449)  . vancomycin Stopped (11/30/17 1049)     LOS: 4 days    Time spent: 25 mins  Vance Gather, MD [name] Pager [phone]   If 7PM-7AM, please contact night-coverage [email or web address] Password [facility] 11/30/2017, 3:46 PM

## 2017-11-30 NOTE — Discharge Instructions (Addendum)
Information on my medicine - ELIQUIS (apixaban)  This medication education was reviewed with me or my healthcare representative as part of my discharge preparation.  The pharmacist that spoke with me during my hospital stay was:  Saundra Shelling, [facility]  Why was Eliquis prescribed for you? Eliquis was prescribed for you to reduce the risk of a blood clot forming that can cause a stroke if you have a medical condition called atrial fibrillation (a type of irregular heartbeat).  What do You need to know about Eliquis ? Take your Eliquis TWICE DAILY - one tablet in the morning and one tablet in the evening with or without food. If you have difficulty swallowing the tablet whole please discuss with your pharmacist how to take the medication safely.  Take Eliquis exactly as prescribed by your doctor and DO NOT stop taking Eliquis without talking to the doctor who prescribed the medication.  Stopping may increase your risk of developing a stroke.  Refill your prescription before you run out.  After discharge, you should have regular check-up appointments with your healthcare provider that is prescribing your Eliquis.  In the future your dose may need to be changed if your kidney function or weight changes by a significant amount or as you get older.  What do you do if you miss a dose? If you miss a dose, take it as soon as you remember on the same day and resume taking twice daily.  Do not take more than one dose of ELIQUIS at the same time to make up a missed dose.  Important Safety Information A possible side effect of Eliquis is bleeding. You should call your healthcare provider right away if you experience any of the following: ? Bleeding from an injury or your nose that does not stop. ? Unusual colored urine (red or dark brown) or unusual colored stools (red or black). ? Unusual bruising for unknown reasons. ? A serious fall or if you hit your head (even if there is no bleeding).  Some  medicines may interact with Eliquis and might increase your risk of bleeding or clotting while on Eliquis. To help avoid this, consult your healthcare provider or pharmacist prior to using any new prescription or non-prescription medications, including herbals, vitamins, non-steroidal anti-inflammatory drugs (NSAIDs) and supplements.  This website has more information on Eliquis (apixaban): http://www.eliquis.com/eliquis/home    SOAK LEFT FOOT EVERY OTHER DAY IN ANTIBACTERIAL SOAPY WATER FOR 15-20 MINUTES. AFTER EACH SOAK, PLACE XEROFORM OVER HIS LEFT FOOT WOUNDS AND NEW DRY DRESSINGS. REPEAT EVERY OTHER DAY.

## 2017-11-30 NOTE — Plan of Care (Signed)
  Nutrition: Adequate nutrition will be maintained 11/30/2017 1306 - Progressing by Williams Che, RN   Coping: Level of anxiety will decrease 11/30/2017 1306 - Progressing by Williams Che, RN   Elimination: Will not experience complications related to bowel motility 11/30/2017 1306 - Progressing by Williams Che, RN   Pain Managment: General experience of comfort will improve 11/30/2017 1306 - Progressing by Williams Che, RN

## 2017-11-30 NOTE — Consult Note (Signed)
Cardiology Consultation:   Patient ID: [name]; [id number]; 1920-09-29   Admit date: 11/26/2017 Date of Consult: 11/30/2017  Primary Care Provider: Lajean Manes, MD Primary Cardiologist:::  Sinclair Grooms, MD  PV Dr. Gwenlyn Found Primary Electrophysiologist:  NA   Patient Profile:   Barry Wright is a 84 y.o. male with a hx of critical limb ischemia and hx of remote CABG, CHF, and carotid disease, hx CVA and is wheelchair bound who is being seen today for the evaluation of pre-op eval at the request of Dr. Bonner Puna for possible amputation.    History of Present Illness:   Barry Wright has a hx of critical limb ischemia and hx of remote CABG in 1996 X 5 he believes, CHF, and carotid disease, hx CVA and is wheelchair bound, polymyalgia rheumatica, hx nephrectomy in 1999.  Last echo EF 45-50% mild LVH, mild AS.  Also with advanced parkinson's disease.    Now admitted 11/26/17 with sepsis secondary to Lt foot with apparent gangrene at 5th toe and eschar.   Placed on IV vanc and cefepime.   Was obtunded on admit.  He was hypotensive initially.  He has been confused as well now that he is more awake.     Dopplers performed in Dr. Kennon Holter office 09/22/17 revealed tibial vessel disease on the left.  Per Dr. Gwenlyn Found "Given his age and comorbidities I do not think he is a candidate for endovascular therapy recommended aggressive local wound care"   EKG:  The EKG was personally reviewed and demonstrates:  SR with 1st degree AV block RBBB Telemetry:  Telemetry was personally reviewed and demonstrates:  NO telemetry  May need BKA/AKA but PV angio may be needed. Ortho felt it may be worthwhile to have cardiology weigh in on risk of procedures knowing recurrent bouts of sepsis are possible.  Currently and recently he has not had chest pain.  No SOB but pretty much he is in bed all the time due to his foot.  With hx of nephrectomy and GFR 35 concern for PV -      Wife is not happy with the choices.     Past Medical History:  Diagnosis Date  . CAD (coronary artery disease)   . CHF (congestive heart failure) ([place])   . Chronic back pain   . Chronic kidney disease (CKD), stage III (moderate) (HCC)   . History of nephrectomy   . Hx of CABG   . Hypercholesteremia   . Hypothyroidism   . Internal carotid artery stenosis   . Left bundle branch block   . Myocardial infarction ([place])   . Polymyalgia rheumatica ([place])   . Stroke [facility])     Past Surgical History:  Procedure Laterality Date  . CHOLECYSTECTOMY    . CORONARY ARTERY BYPASS GRAFT    . KIDNEY SURGERY     kidney removed  . NEPHRECTOMY       Home Medications:  Prior to Admission medications   Medication Sig Start Date End Date Taking? Authorizing Provider  acetaminophen (TYLENOL) 500 MG tablet Take 1,000 mg by mouth 2 (two) times daily.    Yes [provider]  Calcium Carb-Cholecalciferol (CALCIUM-VITAMIN D) 500-200 MG-UNIT tablet Take 1 tablet by mouth daily.   Yes [provider]  dextromethorphan (DELSYM COUGH CHILDRENS) 30 MG/5ML liquid Take 30 mg by mouth every 12 (twelve) hours as needed for cough.   Yes [provider]  doxycycline (VIBRAMYCIN) 100 MG capsule Take 1 capsule (100 mg  total) by mouth 2 (two) times daily. 09/03/17  Yes Hyatt, Max T, DPM  ELIQUIS 2.5 MG TABS tablet Take 2.5 mg by mouth 2 (two) times daily.  09/14/17  Yes [provider]  feeding supplement, ENSURE ENLIVE, (ENSURE ENLIVE) LIQD Take 237 mLs by mouth 2 (two) times daily between meals. 05/12/16  Yes Jani Gravel, MD  FOLIC ACID PO Take 1 mg by mouth daily.    Yes [provider]  furosemide (LASIX) 20 MG tablet Take 0.5 tablets (10 mg total) by mouth daily. Resume on Saturday 01/31/2017. Patient taking differently: Take 20 mg by mouth daily. Resume on Saturday 01/31/2017. 01/31/17  Yes Eugenie Filler, MD  gabapentin (NEURONTIN) 100 MG capsule Take 200 mg by mouth 3 (three) times daily.    Yes [provider]  Multiple Vitamins-Minerals (MULTIVITAMIN WITH MINERALS) tablet Take 1 tablet by mouth daily.   Yes [provider]  predniSONE (DELTASONE) 5 MG tablet Take 5 mg by mouth daily.   Yes [provider]  SYNTHROID 100 MCG tablet Take 100 mcg by mouth daily. 03/09/17  Yes [provider]  traMADol (ULTRAM) 50 MG tablet Take one tablet by mouth twice daily for pains PRN Patient taking differently: Take 50 mg by mouth every 8 (eight) hours as needed for moderate pain.  09/29/15  Yes Regalado, Belkys A, MD  doxycycline (VIBRA-TABS) 100 MG tablet Take 1 tablet (100 mg total) by mouth 2 (two) times daily. Patient not taking: Reported on 11/26/2017 11/24/17   Trula Slade, DPM  methotrexate (RHEUMATREX) 2.5 MG tablet Take 10 mg by mouth every Wednesday.    [provider]  mupirocin ointment (BACTROBAN) 2 % Apply to wound twice a day. Patient not taking: Reported on 11/26/2017 09/01/17   Garrel Ridgel, DPM  ondansetron [facility]) 8 MG tablet  09/09/17   [provider]  polyethylene glycol powder (GLYCOLAX/MIRALAX) powder  06/30/17   [provider]    Inpatient Medications: Scheduled Meds: . apixaban  2.5 mg Oral BID  . folic acid  1 mg Oral Daily  . gabapentin  200 mg Oral TID  . hydrocortisone sod succinate (SOLU-CORTEF) inj  50 mg Intravenous Q6H  . levothyroxine  100 mcg Oral QAC breakfast   Continuous Infusions: . 0.9 % NaCl with KCl 20 mEq / L 75 mL/hr at 11/27/17 1741  . ceFEPime (MAXIPIME) IV Stopped (11/30/17 1241)  . metronidazole Stopped (11/30/17 1449)  . vancomycin Stopped (11/30/17 1049)   PRN Meds: acetaminophen **OR** acetaminophen, bisacodyl, polyvinyl alcohol, traMADol  Allergies:    Allergies  Allergen Reactions  . Penicillins Rash and Other (See Comments)    Has patient had a PCN reaction causing immediate rash, facial/tongue/throat swelling, SOB or lightheadedness with hypotension: Unknown Has patient  had a PCN reaction causing severe rash involving mucus membranes or skin necrosis: Unknown Has patient had a PCN reaction that required hospitalization: Unknown Has patient had a PCN reaction occurring within the last 10 years: Unknown If all of the above answers are "NO", then may proceed with Cephalosporin use.     Social History:   Social History   Socioeconomic History  . Marital status: Married    Spouse name: Not on file  . Number of children: Not on file  . Years of education: Not on file  . Highest education level: Not on file  Social Needs  . Financial resource strain: Not on file  . Food insecurity - worry: Not on file  .  Food insecurity - inability: Not on file  . Transportation needs - medical: Not on file  . Transportation needs - non-medical: Not on file  Occupational History  . Not on file  Tobacco Use  . Smoking status: Former Smoker    Types: Cigarettes    Last attempt to quit: 11/10/1970    Years since quitting: 47.0  . Smokeless tobacco: Never Used  Substance and Sexual Activity  . Alcohol use: No  . Drug use: No  . Sexual activity: No  Other Topics Concern  . Not on file  Social History Narrative  . Not on file    Family History:    Family History  Problem Relation Age of Onset  . Other Mother        [place] Flu  . Heart disease Father      ROS:  Please see the history of present illness.  ROS  General:no colds or fevers, no weight changes Skin:no rashes + ulcers HEENT:no blurred vision, no congestion CV:see HPI PUL:see HPI GI:no diarrhea constipation or melena, no indigestion GU:no hematuria, no dysuria MS:no joint pain, no claudication Neuro:no syncope, no lightheadedness Endo:no diabetes, + thyroid disease .     Physical Exam/Data:   Vitals:   11/29/17 1345 11/29/17 2202 11/30/17 0643 11/30/17 1358  BP: (!) 118/58 (!) 132/56 (!) 129/48 (!) 146/53  Pulse: (!) 57 (!) 52 (!) 49 62  Resp: 16 16 16 17   Temp: 97.7 F (36.5 C)  97.9 F (36.6 C) (!) 97.5 F (36.4 C) 98.7 F (37.1 C)  TempSrc: Oral Axillary Axillary Oral  SpO2: 98% 99% 98% 98%  Weight:      Height:        Intake/Output Summary (Last 24 hours) at 11/30/2017 1553 Last data filed at 11/30/2017 1359 Gross per 24 hour  Intake 480 ml  Output 550 ml  Net -70 ml   Filed Weights   11/26/17 0742  Weight: 184 lb (83.5 kg)   Body mass index is 25.66 kg/m.  General:  Well nourished, well developed, in no acute distress, taking bites of dinner HEENT: normal Lymph: no adenopathy Neck: no JVD Endocrine:  No thryomegaly Vascular: No carotid bruits; I do not palpate pedal pulses Cardiac:  normal S1, S2; RRR; 4-7/4 systolic murmur no gallup rub or click Lungs:  clear to auscultation bilaterally, no wheezing, rhonchi or rales  Abd: soft, nontender, no hepatomegaly  Ext: + edema Rt foot, Lt foot with necrotic tissue on medial aspect and 5th toe Musculoskeletal:  No deformities, BUE and BLE strength normal and equal Skin: warm and dry  Neuro: alert and oriented,  no focal abnormalities noted Psych:  Normal affect to flat.    Relevant CV Studies: Dopplers :  09/22/17 Final Interpretation: Right: Resting right ankle-brachial index indicates moderate right lower extremity arterial disease. The right toe-brachial index is abnormal.  Left: ABIs are unreliable suggesting medial calcifications. The left toe-brachial index is abnormal.  Echo 07/20/14  Study Conclusions  - Left ventricle: Diffuse hypokinesis and abnormal septal motion The cavity size was normal. Wall thickness was increased in a pattern of mild LVH. Systolic function was mildly reduced. The estimated ejection fraction was in the range of 45% to 50%. - Aortic valve: There was mild stenosis. There was mild regurgitation. Valve area (VTI): 1.47 cm^2. Valve area (Vmax): 1.49 cm^2. Valve area (Vmean): 1.28 cm^2. - Atrial septum: No defect or patent foramen ovale was  identified.  Laboratory Data:  Chemistry Recent Labs  Lab 11/26/17 0817 11/26/17 1140 11/27/17 0719 11/29/17 1026  NA 143  --  145 142  K 3.0*  --  3.7 3.8  CL 105  --  113* 113*  CO2 26  --  18* 18*  GLUCOSE 122*  --  126* 174*  BUN 32*  --  32* 36*  CREATININE 1.78* 1.72* 1.46* 1.58*  CALCIUM 9.2  --  7.9* 7.6*  GFRNONAA 31* 32* 39* 35*  GFRAA 36* 37* 45* 41*  ANIONGAP 12  --  14 11    Recent Labs  Lab 11/26/17 0817  PROT 5.5*  ALBUMIN 2.9*  AST 25  ALT 13*  ALKPHOS 66  BILITOT 1.1   Hematology Recent Labs  Lab 11/26/17 0817 11/26/17 1140 11/27/17 0719  WBC 20.5* 17.8* 12.2*  RBC 3.22* 2.95* 2.57*  HGB 11.1* 10.3* 9.0*  HCT 34.0* 31.4* 27.1*  MCV 105.6* 106.4* 105.4*  MCH 34.5* 34.9* 35.0*  MCHC 32.6 32.8 33.2  RDW 15.7* 16.1* 16.0*  PLT 138* 123* 129*   Cardiac EnzymesNo results for input(s): TROPONINI in the last 168 hours. No results for input(s): TROPIPOC in the last 168 hours.  BNPNo results for input(s): BNP, PROBNP in the last 168 hours.  DDimer No results for input(s): DDIMER in the last 168 hours.  Radiology/Studies:  Mr Foot Left Wo Contrast  Result Date: 11/29/2017 CLINICAL DATA:  84 year old admitted with sepsis, left foot gangrene and possible osteomyelitis. History stage 3 kidney disease, Parkinson's disease and altered mental status. EXAM: MRI OF THE LEFT FOOT WITHOUT CONTRAST TECHNIQUE: Multiplanar, multisequence MR imaging of the left forefoot was performed. No intravenous contrast was administered. COMPARISON:  None. FINDINGS: Despite efforts by the technologist and patient, moderate motion artifact is present on today's exam and could not be eliminated. This reduces exam sensitivity and specificity. Bones/Joint/Cartilage T1 weighted images demonstrate nonspecific signal changes in the neck of the 2nd proximal phalanx, the distal 4th phalanx and the middle 5th phalanx. These digits are suboptimally evaluated on the T2 weighted images due to  motion. There is nonspecific decreased T1 and increased T2 signal medially in the head of the 1st metatarsal without adjacent joint effusion or focal soft tissue abnormality. No other significant arthropathy or joint effusion demonstrated. Ligaments The Lisfranc ligament is intact. Muscles and Tendons Mild generalized forefoot muscular edema. No focal fluid collection or significant tenosynovitis. Soft tissues Generalized forefoot soft tissue edema, especially in the dorsal subcutaneous fat. No focal fluid collection or skin ulceration identified. IMPRESSION: 1. Generalized soft tissue edema in the forefoot suspicious for cellulitis/myositis. No focal abscess or skin ulceration identified. 2. Nonspecific marrow changes are demonstrated in multiple bones, including the head of the 1st metatarsal (likely arthropathic), the neck of the 2nd proximal phalanx, the distal 4th phalanx and middle 5th phalanx. Plain film correlation recommended as evaluated is limited by motion on this study. Electronically Signed   By: Richardean Sale M.D.   On: 11/29/2017 09:15    Assessment and Plan:   1. Pre-op exam - which would be less stressful for pt's CV status- no changes in treatment, possible PV intervention, or amputation.  Hx CABG in 1996, no chest pain no SOB with minimal activity.  Also hx of nephrectomy.  Dr. Percival Spanish to see.  Difficult situation - wife does not wish anything to be wrong with her husband of 94 years.    2.      AS murmur, check echo to eval wall motion and valvular disease, no SOB  3.       CKD-3  With GFR at 35 and hx nephrectomy  4.        PAD with gangrene of Lt foot. ? On Eliquis for this   5.        Sepsis on admit treated.     6.        Sinus Brady with HR to 49 at times, no tele   For questions or updates, please contact [place] Please consult [email or web address] for contact info under Cardiology/STEMI.   Signed, Cecilie Kicks, NP  11/30/2017 3:53 PM    History and all data  above reviewed.  Patient examined.  I agree with the findings as above.  We are asked to offer our opinion about this gentleman's surgical risk if he were to require lower extremity amputation or revascularization.  He has a history of CAD, PVD, mildly reduced EF, mild AS and AI.  He is very frail and basically can be transferred to a wheelchair or is in bed all of the time in his skilled nursing facility.  He denies any acute symptoms such as chest pain or SOB.  He does not have PND or orthopnea.  He has no palpitations, presyncope or syncope.  He has a very poor functional level  The patient exam reveals COR:RRR, systolic mid peaking murmur and mild diastolic murmur  ,  Lungs: Decreased lung sounds bilateral  ,  Abd: Positive bowel sounds, no rebound no guarding, Ext Absent DP/PT and left foot ulcer with diffuse leg edema  .  All available labs, radiology testing, previous records reviewed. Agree with documented assessment and plan. Preop:  The patient is very high risk for any surgical procedure mostly related to his very poor functional level, chronic non cardiac issues and overall frailty.  His actual risk based on cardiovascular issues is more moderate but the whole picture together constitutes a high risk.  We will obtain an echocardiogram to evaluate his EF and aortic stenosis/regurgitation.   Conduction disturbance:  He has RBBB/LAFB and first degree AV block with sinus bradycardia.  However, he has no syncope or other indication for pacing.  No further intervention or treatment is needed.    Jeneen Rinks Cambrea Kirt  5:39 PM  11/30/2017

## 2017-12-01 DIAGNOSIS — Z951 Presence of aortocoronary bypass graft: Secondary | ICD-10-CM

## 2017-12-01 DIAGNOSIS — Z7952 Long term (current) use of systemic steroids: Secondary | ICD-10-CM

## 2017-12-01 DIAGNOSIS — G20 Parkinson's disease: Secondary | ICD-10-CM

## 2017-12-01 DIAGNOSIS — N183 Chronic kidney disease, stage 3 (moderate): Secondary | ICD-10-CM

## 2017-12-01 DIAGNOSIS — Z515 Encounter for palliative care: Secondary | ICD-10-CM

## 2017-12-01 DIAGNOSIS — Z87891 Personal history of nicotine dependence: Secondary | ICD-10-CM

## 2017-12-01 DIAGNOSIS — E039 Hypothyroidism, unspecified: Secondary | ICD-10-CM

## 2017-12-01 DIAGNOSIS — I96 Gangrene, not elsewhere classified: Secondary | ICD-10-CM

## 2017-12-01 DIAGNOSIS — E7800 Pure hypercholesterolemia, unspecified: Secondary | ICD-10-CM

## 2017-12-01 DIAGNOSIS — Z8673 Personal history of transient ischemic attack (TIA), and cerebral infarction without residual deficits: Secondary | ICD-10-CM

## 2017-12-01 DIAGNOSIS — Z905 Acquired absence of kidney: Secondary | ICD-10-CM

## 2017-12-01 DIAGNOSIS — Z880 Allergy status to penicillin: Secondary | ICD-10-CM

## 2017-12-01 DIAGNOSIS — M353 Polymyalgia rheumatica: Secondary | ICD-10-CM

## 2017-12-01 DIAGNOSIS — Z7189 Other specified counseling: Secondary | ICD-10-CM

## 2017-12-01 DIAGNOSIS — Z89422 Acquired absence of other left toe(s): Secondary | ICD-10-CM

## 2017-12-01 DIAGNOSIS — I2510 Atherosclerotic heart disease of native coronary artery without angina pectoris: Secondary | ICD-10-CM

## 2017-12-01 LAB — CULTURE, BLOOD (ROUTINE X 2)
Culture: NO GROWTH
Culture: NO GROWTH
SPECIAL REQUESTS: ADEQUATE
Special Requests: ADEQUATE

## 2017-12-01 LAB — BASIC METABOLIC PANEL
ANION GAP: 12 (ref 5–15)
BUN: 36 mg/dL — ABNORMAL HIGH (ref 6–20)
CO2: 19 mmol/L — AB (ref 22–32)
Calcium: 7.6 mg/dL — ABNORMAL LOW (ref 8.9–10.3)
Chloride: 111 mmol/L (ref 101–111)
Creatinine, Ser: 1.47 mg/dL — ABNORMAL HIGH (ref 0.61–1.24)
GFR calc non Af Amer: 39 mL/min — ABNORMAL LOW (ref >60)
GFR, EST AFRICAN AMERICAN: 45 mL/min — AB (ref >60)
Glucose, Bld: 104 mg/dL — ABNORMAL HIGH (ref 65–99)
POTASSIUM: 3.7 mmol/L (ref 3.5–5.1)
Sodium: 142 mmol/L (ref 135–145)

## 2017-12-01 MED ORDER — PREDNISONE 5 MG PO TABS
5.0000 mg | ORAL_TABLET | Freq: Every day | ORAL | Status: DC
  Administered 2017-12-01 – 2017-12-02 (×2): 5 mg via ORAL
  Filled 2017-12-01 (×2): qty 1

## 2017-12-01 MED ORDER — DEXTROSE 5 % IV SOLN
1.0000 g | Freq: Three times a day (TID) | INTRAVENOUS | Status: DC
  Administered 2017-12-01: 1 g via INTRAVENOUS
  Filled 2017-12-01 (×2): qty 1

## 2017-12-01 MED ORDER — HYDROCORTISONE NA SUCCINATE PF 100 MG IJ SOLR: 50.0000 mg | Freq: Every day | INTRAMUSCULAR | Status: DC

## 2017-12-01 NOTE — Progress Notes (Signed)
PHARMACY NOTE:  ANTIMICROBIAL RENAL DOSAGE ADJUSTMENT  Current antimicrobial regimen includes a mismatch between antimicrobial dosage and estimated renal function.  As per policy approved by the Pharmacy & Therapeutics and Medical Executive Committees, the antimicrobial dosage will be adjusted accordingly.  Current antimicrobial dosage:  Cefepime 1g IV Q12H  Indication: sepsis from osteomyelitis   Renal Function:  Estimated Creatinine Clearance: 32 mL/min (A) (by C-G formula based on SCr of 1.47 mg/dL (H)). []      On intermittent HD, scheduled: []      On CRRT    Antimicrobial dosage has been changed to:  Cefepime 1gm IV Q8H  Additional comments:   Thank you for allowing pharmacy to be a part of this patient's care.  Martrice Apt D. Mina Marble, PharmD, BCPS Pager:  [phone] 12/01/2017, 10:50 AM

## 2017-12-01 NOTE — Progress Notes (Addendum)
PROGRESS NOTE    Barry Wright  PNT:[id number] DOB: 28-Dec-1919 DOA: 11/26/2017 PCP: Lajean Manes, MD   Brief Narrative:  Barry Wright is a 84 year old with a history of left foot gangrene followed by podiatry, advanced Parkinson's disease, CVA, CAD status post CABG, stage III kidney disease, hypothyroidism, hypercholesterolemia and nephrectomy who was sent to the hospital for evaluation of change in mental status, fever and hypotension.  He was admitted for sepsis secondary to left foot gangrene mostly focused .  He was started on broad-spectrum antibiotics. Goals of care have been discussed throughout hospitalization and initially, orthopedics felt revascularization and/or amputation may be required, so cardiology was consulted for surgical clearance. Palliative care was consulted for ongoing discussions with family given his poor functional status and severe peripheral arterial disease. On 1/21, orthopedics performed debridement at the bedside with autoamputation of the 5th toe and no further interventions are planned. Infectious disease has been consulted for further medical management recommendations.   Assessment & Plan:   Principal Problem:   Gangrene of left foot (HCC) Active Problems:   Chronic kidney disease (CKD), stage III (moderate) (HCC)   Polymyalgia rheumatica (HCC)   Carotid artery disease (HCC)   Hypokalemia   Essential hypertension, benign   Hypothyroidism   Physical deconditioning   Chronic diastolic heart failure (HCC)   Sepsis (HCC)   Parkinson disease ([place])   Advance care planning   Goals of care, counseling/discussion   Palliative care by specialist  Sepsis secondary to left foot gangrene and osteomyelitis: Complicated by PVD.  - Continue broad empiric coverage (vancomycin, cefepime, flagyl) pending ID recommendation - Monitor cultures - Orthopedics consulted and performed bedside debridement.  - Plan is for aggressive local wound care, per orthopedics:  "That would include soaking his left foot once once every other day for 15 - 20 minutes in antibacterial soapy water followed by placing xeroform over the wounds and new dry dressing.  This could be repeated every other day"  Peripheral vascular disease: Right ABI 0.73, left TBI 0.32. Dr. Kennon Holter clinic note in Nov 2018 stated endovascular intervention was too high risk.  - He is at very high risk, and essentially not a candidate for revascularization or significant surgery per cardiology.  Acute delirium: Due to acute illness/infection.  - Delirium precautions reviewed.  History of polymyalgia rheumatica - Holding MTX. Taper stress steroids to prednisone home dose.   Chronic diastolic and systolic heart failure: Appears mildly hypovolemic, last EF 45% in 2015.  - Holding lasix for now.  - Echocardiogram considered per cardiology to evaluate EF and aortic valve.   Hypothyroidism - Restart synthroid.  Anemia chronic disease: No signs of acute blood loss or active blood loss at this time. - Monitor hgb.   Coronary artery disease/peripheral vascular disease - Restarted eliquis  Peripheral neuropathy - Restart gabapentin  DVT prophylaxis: Eliquis Code Status: DO NOT RESUSCITATE Family Communication: Discussed with daughter by phone. Palliative medicine team held family meeting earlier today.  Disposition Plan: To be determined, currently leaning toward home with hospice.   Subjective: Palliative meeting this afternoon was helpful in distilling goals of care. No fever or pain reported by patient.  Objective: Vitals:   11/30/17 2000 12/01/17 0604 12/01/17 0905 12/01/17 1500  BP: 133/65 (!) 108/57 133/61 (!) 125/59  Pulse: (!) 59 (!) 57 81 64  Resp: 17 17 18 16   Temp: 98.5 F (36.9 C) 98.2 F (36.8 C) 97.9 F (36.6 C) 98.7 F (37.1 C)  TempSrc: Oral Oral  Oral Oral  SpO2: 96% 97%  98%  Weight:      Height:        Intake/Output Summary (Last 24 hours) at 12/01/2017  1509 Last data filed at 12/01/2017 1761 Gross per 24 hour  Intake 1325 ml  Output 1200 ml  Net 125 ml   Filed Weights   11/26/17 0742  Weight: 83.5 kg (184 lb)    Examination:  Gen: Elderly male in NAD HEENT: MMM, posterior oropharynx clear Pulm: Non-labored; CTAB, no wheezes  CV: Regular rate, no murmur appreciated; distal pulses intact/symmetric GI: + BS; soft, non-tender, non-distended Skin: Left 5th toe now missing with serosanguinous discharge. Malodorous discharge resolved. Neuro: Alert, HOH, remains oriented x 3, but unable to comprehend complex medical decision making issues. No focal deficits noted.   Data Reviewed:   CBC: Recent Labs  Lab 11/26/17 0817 11/26/17 1140 11/27/17 0719  WBC 20.5* 17.8* 12.2*  NEUTROABS 16.7*  --   --   HGB 11.1* 10.3* 9.0*  HCT 34.0* 31.4* 27.1*  MCV 105.6* 106.4* 105.4*  PLT 138* 123* 607*   Basic Metabolic Panel: Recent Labs  Lab 11/26/17 0817 11/26/17 1140 11/27/17 0719 11/29/17 1026 12/01/17 0811  NA 143  --  145 142 142  K 3.0*  --  3.7 3.8 3.7  CL 105  --  113* 113* 111  CO2 26  --  18* 18* 19*  GLUCOSE 122*  --  126* 174* 104*  BUN 32*  --  32* 36* 36*  CREATININE 1.78* 1.72* 1.46* 1.58* 1.47*  CALCIUM 9.2  --  7.9* 7.6* 7.6*   GFR: Estimated Creatinine Clearance: 32 mL/min (A) (by C-G formula based on SCr of 1.47 mg/dL (H)). Liver Function Tests: Recent Labs  Lab 11/26/17 0817  AST 25  ALT 13*  ALKPHOS 66  BILITOT 1.1  PROT 5.5*  ALBUMIN 2.9*   No results for input(s): LIPASE, AMYLASE in the last 168 hours. No results for input(s): AMMONIA in the last 168 hours. Coagulation Profile: Recent Labs  Lab 11/26/17 1140  INR 1.24   Cardiac Enzymes: No results for input(s): CKTOTAL, CKMB, CKMBINDEX, TROPONINI in the last 168 hours. BNP (last 3 results) No results for input(s): PROBNP in the last 8760 hours. HbA1C: No results for input(s): HGBA1C in the last 72 hours. CBG: Recent Labs  Lab  11/26/17 1411  GLUCAP 118*   Lipid Profile: No results for input(s): CHOL, HDL, LDLCALC, TRIG, CHOLHDL, LDLDIRECT in the last 72 hours. Thyroid Function Tests: No results for input(s): TSH, T4TOTAL, FREET4, T3FREE, THYROIDAB in the last 72 hours. Anemia Panel: No results for input(s): VITAMINB12, FOLATE, FERRITIN, TIBC, IRON, RETICCTPCT in the last 72 hours. Sepsis Labs: Recent Labs  Lab 11/26/17 0835 11/26/17 1140  PROCALCITON  --  2.49  LATICACIDVEN 1.64 1.8    Recent Results (from the past 240 hour(s))  Blood culture (routine x 2)     Status: None   Collection Time: 11/26/17  8:17 AM  Result Value Ref Range Status   Specimen Description BLOOD RIGHT ANTECUBITAL  Final   Special Requests IN PEDIATRIC BOTTLE Blood Culture adequate volume  Final   Culture NO GROWTH 5 DAYS  Final   Report Status 12/01/2017 FINAL  Final  Blood culture (routine x 2)     Status: None   Collection Time: 11/26/17  8:25 AM  Result Value Ref Range Status   Specimen Description BLOOD RIGHT FOREARM  Final   Special Requests   Final  BOTTLES DRAWN AEROBIC AND ANAEROBIC Blood Culture adequate volume   Culture NO GROWTH 5 DAYS  Final   Report Status 12/01/2017 FINAL  Final  Urine culture     Status: None   Collection Time: 11/26/17 10:26 AM  Result Value Ref Range Status   Specimen Description URINE, CATHETERIZED  Final   Special Requests NONE  Final   Culture NO GROWTH  Final   Report Status 11/27/2017 FINAL  Final         Radiology Studies: No results found.      Scheduled Meds: . apixaban  2.5 mg Oral BID  . folic acid  1 mg Oral Daily  . gabapentin  200 mg Oral TID  . levothyroxine  100 mcg Oral QAC breakfast  . predniSONE  5 mg Oral Q breakfast   Continuous Infusions: . 0.9 % NaCl with KCl 20 mEq / L 75 mL/hr at 12/01/17 0357  . ceFEPime (MAXIPIME) IV Stopped (12/01/17 1147)  . metronidazole 500 mg (12/01/17 1458)  . vancomycin Stopped (12/01/17 1018)     LOS: 5 days     Time spent: 25 mins  Vance Gather, MD [name] Pager [phone]   If 7PM-7AM, please contact night-coverage [email or web address] Password [facility] 12/01/2017, 3:09 PM

## 2017-12-01 NOTE — Care Management Important Message (Signed)
Important Message  Patient Details  Name: Barry Wright MRN: [id number] Date of Birth: [birth year]   Medicare Important Message Given:  Yes    Jaimi Belle [address] 12/01/2017, 3:31 PM

## 2017-12-01 NOTE — Progress Notes (Addendum)
No charge note.  Palliative consult received:   Meeting tentatively scheduled for 1pm today with patient's wife.   Have reached out to patient's POA- Darin Engels.   Spoke to Hoy Morn- patient's wife with some dementia. Family dynamics are strained due to Mr. And Mrs. Sherard not being happy with assisted living facility. Shanon Brow states patient's wife would be unable to make decisions and GOC should happen with Neoma Laming. He will reach out and try and have Neoma Laming attend meeting at [place], AGNP-C Palliative Medicine  Please call Palliative Medicine team phone with any questions [phone]. For individual providers please see AMION.

## 2017-12-01 NOTE — Consult Note (Signed)
[facility] for Infectious Disease    Date of Admission:  11/26/2017     Total days of antibiotics 6  Day 6 Vancomycin  Day 6 Cefepime  Day 5 Flagyl               Reason for Consult: gangrene of left foot    Referring Provider: Bonner Wright    Assessment: Barry Wright is a 84 y.o. male with gangrene and likely osteomyelitis of the left foot in the setting of severe peripheral arterial disease. This is a very difficult situation as without vascular intervention he is less likely to heal from a TM amputation or BKA. Antibiotics are certainly not going to be curative for him considering limb ischemia nor will it likely prevent future issues regarding infection/sepsis. His necrotic toe has since been removed during bedside debridement and remaining site is clean and dry without edema or erythema to surrounding tissues. There is some remaining dry eschar but it is intact and most of it has been debrided.   Plan: 1. At this point considering his toe has been essentially amputated I would stop antibiotics and proceed with wound care as above for this chronic ischemic wound. I do not think the risk of adverse events including kidney injury, pancytopenia, CDI infections and other side effects associated with prolonged antibiotics is worth it when it will not be curative.  2. Tedious wound care to site as recommended per wound care and orthopedic team. Outpatient wound care treatments would be helpful.  3. His wife is very concerned about outpatient care - great appreciation to primary, palliative care and CM/LCSW teams helping with his dispo and goals of care.   This has been discussed with Barry Wright, his wife and ex-son in law. We are available as needed for Barry Wright.   Principal Problem:   Gangrene of left foot (HCC) Active Problems:   Sepsis ([place])   Chronic kidney disease (CKD), stage III (moderate) (HCC)   Polymyalgia rheumatica (HCC)   Carotid artery disease (HCC)   Hypokalemia  Essential hypertension, benign   Hypothyroidism   Physical deconditioning   Chronic diastolic heart failure (HCC)   Parkinson disease (HCC)  HPI: Barry Wright is a 84 y.o. male with pmhx significant for advanced Parkinson's disease, polymyalgia rheumatica (on methotrexate and prednsone at home), history of CVA, CAD s/p CABG, CKD3, hypothyroidism, hypercholesterolemia and nephrectomy. He is a previous smoker with quit date 40 years ago. He was sent to the hospital for evaluation of change in mental status, fever and hypotension. Sepsis protocol initiated including cultures of blood and urine prior to empiric initiation of vancomycin and cefepime. Flagyl was later added after found that likely contributing source is left foot gangrene and likely osteomyelitis.    He is a patient of Dr. Kennon Holter and note in November 2018 reported he was too high risk for any revascularization procedure. Left TBI 0.32. Dr. Ninfa Linden with orthopedics was consulted for consideration of amputation -   Of note there has been some attempts at [place] discussions with he and his family and palliative care medicine team, however there appears to be some discordance and disagreement amongst some of Barry Wright family members.   His wife of 66 years is at his bedside today. She is very upset and insistant that she be included   . apixaban  2.5 mg Oral BID  . folic acid  1 mg Oral Daily  . gabapentin  200 mg  Oral TID  . levothyroxine  100 mcg Oral QAC breakfast  . predniSONE  5 mg Oral Q breakfast    Review of Systems: Review of Systems  Constitutional: Positive for fever. Negative for chills.  Eyes: Negative for blurred vision.  Respiratory: Negative for cough and shortness of breath.   Cardiovascular: Negative for chest pain and leg swelling.  Gastrointestinal: Negative for abdominal pain and diarrhea.  Genitourinary: Negative for dysuria.  Musculoskeletal: Negative for joint pain and myalgias.  Skin: Negative for  rash.  Neurological: Negative for dizziness and headaches.    Past Medical History:  Diagnosis Date  . CAD (coronary artery disease)   . CHF (congestive heart failure) ([place])   . Chronic back pain   . Chronic kidney disease (CKD), stage III (moderate) (HCC)   . History of nephrectomy   . Hx of CABG   . Hypercholesteremia   . Hypothyroidism   . Internal carotid artery stenosis   . Left bundle branch block   . Myocardial infarction ([place])   . Polymyalgia rheumatica ([place])   . Stroke [facility])     Social History   Tobacco Use  . Smoking status: Former Smoker    Types: Cigarettes    Last attempt to quit: 11/10/1970    Years since quitting: 47.0  . Smokeless tobacco: Never Used  Substance Use Topics  . Alcohol use: No  . Drug use: No    Family History  Problem Relation Age of Onset  . Other Mother        [place] Flu  . Heart disease Father    Allergies  Allergen Reactions  . Penicillins Rash and Other (See Comments)    Has patient had a PCN reaction causing immediate rash, facial/tongue/throat swelling, SOB or lightheadedness with hypotension: Unknown Has patient had a PCN reaction causing severe rash involving mucus membranes or skin necrosis: Unknown Has patient had a PCN reaction that required hospitalization: Unknown Has patient had a PCN reaction occurring within the last 10 years: Unknown If all of the above answers are "NO", then may proceed with Cephalosporin use.     OBJECTIVE: Blood pressure 133/61, pulse 81, temperature 97.9 F (36.6 C), temperature source Oral, resp. rate 18, height 5\' 11"  (1.803 m), weight 184 lb (83.5 kg), SpO2 97 %.  Physical Exam  Constitutional: He is oriented to person, place, and time and well-developed, well-nourished, and in no distress.  HENT:  Mouth/Throat: No oral lesions. Normal dentition. No dental caries.  Eyes: No scleral icterus.  Cardiovascular: Normal rate and regular rhythm.  Murmur heard. Pulmonary/Chest: Effort  normal and breath sounds normal.  Abdominal: Soft. He exhibits no distension. There is no tenderness.  Musculoskeletal: He exhibits no tenderness.  Left foot - #5 toe has been removed with small exposed bone. No erythema, purulence or edema noted to wound bed or surrounding tissues. Lots of dry flaking skin and one ~1cm annular area of necrosis noted to #1MTP joint.   Lymphadenopathy:    He has no cervical adenopathy.  Neurological: He is alert and oriented to person, place, and time.  Skin: Skin is warm and dry. No rash noted.  Psychiatric: Mood and affect normal.  Vitals reviewed.  Lab Results Lab Results  Component Value Date   WBC 12.2 (H) 11/27/2017   HGB 9.0 (L) 11/27/2017   HCT 27.1 (L) 11/27/2017   MCV 105.4 (H) 11/27/2017   PLT 129 (L) 11/27/2017    Lab Results  Component Value Date  CREATININE 1.47 (H) 12/01/2017   BUN 36 (H) 12/01/2017   NA 142 12/01/2017   K 3.7 12/01/2017   CL 111 12/01/2017   CO2 19 (L) 12/01/2017    Lab Results  Component Value Date   ALT 13 (L) 11/26/2017   AST 25 11/26/2017   ALKPHOS 66 11/26/2017   BILITOT 1.1 11/26/2017     Microbiology: Recent Results (from the past 240 hour(s))  Blood culture (routine x 2)     Status: None   Collection Time: 11/26/17  8:17 AM  Result Value Ref Range Status   Specimen Description BLOOD RIGHT ANTECUBITAL  Final   Special Requests IN PEDIATRIC BOTTLE Blood Culture adequate volume  Final   Culture NO GROWTH 5 DAYS  Final   Report Status 12/01/2017 FINAL  Final  Blood culture (routine x 2)     Status: None   Collection Time: 11/26/17  8:25 AM  Result Value Ref Range Status   Specimen Description BLOOD RIGHT FOREARM  Final   Special Requests   Final    BOTTLES DRAWN AEROBIC AND ANAEROBIC Blood Culture adequate volume   Culture NO GROWTH 5 DAYS  Final   Report Status 12/01/2017 FINAL  Final  Urine culture     Status: None   Collection Time: 11/26/17 10:26 AM  Result Value Ref Range Status    Specimen Description URINE, CATHETERIZED  Final   Special Requests NONE  Final   Culture NO GROWTH  Final   Report Status 11/27/2017 FINAL  Final    Janene Madeira, MSN, NP-C [facility] Cell: [phone] Pager: [phone]  12/01/2017 1:57 PM

## 2017-12-01 NOTE — Progress Notes (Signed)
Patient ID: Barry Wright, male   DOB: 09/02/1920, 84 y.o.   MRN: [id number] His left foot looks stable from where I removed necrotic skin and soft tissue at the bedside last evening including removing his 5th toe.  From my standpoint, he just needs local wound care for now.  That would include soaking his left foot once once every other day for 15 - 20 minutes in antibacterial soapy water followed by placing xeroform over the wounds and new dry dressing.  This could be repeated every other day at skilled nursing.

## 2017-12-01 NOTE — Consult Note (Signed)
Consultation Note Date: 12/01/2017   Patient Name: Barry Wright  DOB: 07/11/1920  MRN: [id number]  Age / Sex: 84 y.o., male  PCP: Lajean Manes, MD Referring Physician: Patrecia Pour, MD  Reason for Consultation: Establishing goals of care  HPI/Patient Profile: 84 y.o. male  with past medical history of CKD, polymyalgia rheumatica, hypothyroidism, CHF, CVA, CAD s/p CABG admitted on 11/26/2017 with altered mental status. Workup revealed sepsis due to gangrenous toes and foot with osteomyelitis likely. Amputation, revascularization procedures have been recommended. Cardiology has deemed him high risk for any surgical interventions. Amputation alone is unlikely to improve status without revascularization as he would likely not heal. Palliative medicine consulted for [place].     Clinical Assessment and Goals of Care: Evaluated patient and met with patient's spouse- Barry Wright, daughterNeoma Wright, and ex-son in law- Barry Wright.   Consult was complicated by difficult family dynamics. Patient's spouse has dementia- and is unable to competently make decisions for patient. She expresses a great deal of anger re: their living situation, towards their daughter, and toward her feeling that she should be the only person communicated to about patient and decisions needed. However, during the conversation, she is unable to retain information given about patient's health. PMT attempted to communicate and include her in the conversation, however, patient's daughters are determined to be the primary decision makers.   I have reviewed medical records including EPIC notes, labs and imaging, assessed the patient and then met to discuss diagnosis prognosis, GOC, EOL wishes, disposition and options.  I introduced Palliative Medicine as specialized medical care for people living with serious illness. It focuses on providing relief from the symptoms  and stress of a serious illness. The goal is to improve quality of life for both the patient and the family.  We discussed a brief life review of the patient. He and spouse have been married for 73 years. Prior to admission he was residing at [facility] with spouse.   As far as functional and nutritional status - he is wheelchair bound for three years now. He is dependent for ADL's. His wife states he was eating well prior to admission. She has noticed a decrease in is functional level of the last year- including changes in his mentation.    We discussed their current illness and what it means in the larger context of their on-going co-morbidities.  Natural disease trajectory and expectations at EOL were discussed.  I attempted to elicit values and goals of care important to the patient. Everyone agrees that having the patient and spouse remain together is of the upmost importance.    The difference between aggressive medical intervention and comfort care was considered in light of the patient's goals of care.   Patient was admitted to [facility] approx one year ago, but was released after not showing evidence of decline. We discussed that new onset sepsis with apparent spread now to osteomyelitis would likely qualify patient to receive hospice services again- particularly if comfort care path vs aggressive medical therapy  was decided.   Advanced directives, concepts specific to code status, artifical feeding and hydration, and rehospitalization were considered and discussed. Patient has DNR in place. He has a living will stating no extraordinary measures.   Hospice and Palliative Care services outpatient were explained and offered.  Questions and concerns were addressed.  Hard Choices booklet left for review. The family was encouraged to call with questions or concerns.   Deborah correctly expressed concern that surgical option is not likely to improve patient's status as he will be left with a  wound that will likely not heal. We discussed what comfort/palliative care would look like- and if she would choose to treat future sepsis, or choose to support patient through dying process. Barry Wright feels that patient's current QOL will not be improved with surgery, she believes that her father would want palliative treatment only- and if sepsis reoccurred would rather have support through end of life. We also discussed the fact that having surgery would likely mean SNF and patient and spouse would not be able to be together.   Barry Wright is going to contact her sister and discuss option of home with hospice services. She has PMT contact information and will call.    Primary Decision Maker NEXT OF KIN patient's daughter- Barry Wright    SUMMARY OF RECOMMENDATIONS -Patients spouse is not able to make decisions for patient -Likely no surgery at this point- ortho saw last night and debrided at bedside- patient's toe self-amputated with manipulation. -GOC likely to move towards returning to home with Hospice    Code Status/Advance Care Planning:  DNR    Symptom Management:   Patient verbalizes comfort at this time  Prognosis:    < 6 months due to gangrenous foot, progression of decline in functional status, no good surgical options  Discharge Planning: To Be Determined - possibly home with Hospice  Primary Diagnoses: Present on Admission: . Sepsis ([place]) . Chronic Wright disease (CKD), stage III (moderate) (HCC) . Polymyalgia rheumatica ([place]) . Carotid artery disease ([place]) . Essential hypertension, benign . Hypothyroidism . Physical deconditioning . Chronic diastolic heart failure ([place])   I have reviewed the medical record, interviewed the patient and family, and examined the patient. The following aspects are pertinent.  Past Medical History:  Diagnosis Date  . CAD (coronary artery disease)   . CHF (congestive heart failure) ([place])   . Chronic back pain   . Chronic Wright  disease (CKD), stage III (moderate) (HCC)   . History of nephrectomy   . Hx of CABG   . Hypercholesteremia   . Hypothyroidism   . Internal carotid artery stenosis   . Left bundle branch block   . Myocardial infarction ([place])   . Polymyalgia rheumatica ([place])   . Stroke [facility])    Social History   Socioeconomic History  . Marital status: Married    Spouse name: None  . Number of children: None  . Years of education: None  . Highest education level: None  Social Needs  . Financial resource strain: None  . Food insecurity - worry: None  . Food insecurity - inability: None  . Transportation needs - medical: None  . Transportation needs - non-medical: None  Occupational History  . None  Tobacco Use  . Smoking status: Former Smoker    Types: Cigarettes    Last attempt to quit: 11/10/1970    Years since quitting: 47.0  . Smokeless tobacco: Never Used  Substance and Sexual Activity  . Alcohol use: No  .  Drug use: No  . Sexual activity: No  Other Topics Concern  . None  Social History Narrative  . None   Family History  Problem Relation Age of Onset  . Other Mother        [place] Flu  . Heart disease Father    Scheduled Meds: . apixaban  2.5 mg Oral BID  . folic acid  1 mg Oral Daily  . gabapentin  200 mg Oral TID  . levothyroxine  100 mcg Oral QAC breakfast  . predniSONE  5 mg Oral Q breakfast   Continuous Infusions: . 0.9 % NaCl with KCl 20 mEq / L 75 mL/hr at 12/01/17 0357  . ceFEPime (MAXIPIME) IV Stopped (12/01/17 1147)  . metronidazole Stopped (12/01/17 4174)  . vancomycin Stopped (12/01/17 1018)   PRN Meds:.acetaminophen **OR** acetaminophen, bisacodyl, polyvinyl alcohol, traMADol Medications Prior to Admission:  Prior to Admission medications   Medication Sig Start Date End Date Taking? Authorizing Provider  acetaminophen (TYLENOL) 500 MG tablet Take 1,000 mg by mouth 2 (two) times daily.    Yes [provider]  Calcium Carb-Cholecalciferol  (CALCIUM-VITAMIN D) 500-200 MG-UNIT tablet Take 1 tablet by mouth daily.   Yes [provider]  dextromethorphan (DELSYM COUGH CHILDRENS) 30 MG/5ML liquid Take 30 mg by mouth every 12 (twelve) hours as needed for cough.   Yes [provider]  doxycycline (VIBRAMYCIN) 100 MG capsule Take 1 capsule (100 mg total) by mouth 2 (two) times daily. 09/03/17  Yes Hyatt, Max T, DPM  ELIQUIS 2.5 MG TABS tablet Take 2.5 mg by mouth 2 (two) times daily.  09/14/17  Yes [provider]  feeding supplement, ENSURE ENLIVE, (ENSURE ENLIVE) LIQD Take 237 mLs by mouth 2 (two) times daily between meals. 05/12/16  Yes Jani Gravel, MD  FOLIC ACID PO Take 1 mg by mouth daily.    Yes [provider]  furosemide (LASIX) 20 MG tablet Take 0.5 tablets (10 mg total) by mouth daily. Resume on Saturday 01/31/2017. Patient taking differently: Take 20 mg by mouth daily. Resume on Saturday 01/31/2017. 01/31/17  Yes Eugenie Filler, MD  gabapentin (NEURONTIN) 100 MG capsule Take 200 mg by mouth 3 (three) times daily.    Yes [provider]  Multiple Vitamins-Minerals (MULTIVITAMIN WITH MINERALS) tablet Take 1 tablet by mouth daily.   Yes [provider]  predniSONE (DELTASONE) 5 MG tablet Take 5 mg by mouth daily.   Yes [provider]  SYNTHROID 100 MCG tablet Take 100 mcg by mouth daily. 03/09/17  Yes [provider]  traMADol (ULTRAM) 50 MG tablet Take one tablet by mouth twice daily for pains PRN Patient taking differently: Take 50 mg by mouth every 8 (eight) hours as needed for moderate pain.  09/29/15  Yes Regalado, Belkys A, MD  doxycycline (VIBRA-TABS) 100 MG tablet Take 1 tablet (100 mg total) by mouth 2 (two) times daily. Patient not taking: Reported on 11/26/2017 11/24/17   Trula Slade, DPM  methotrexate (RHEUMATREX) 2.5 MG tablet Take 10 mg by mouth every Wednesday.    [provider]  mupirocin ointment (BACTROBAN) 2 % Apply to wound twice a  day. Patient not taking: Reported on 11/26/2017 09/01/17   Garrel Ridgel, DPM  ondansetron [facility]) 8 MG tablet  09/09/17   [provider]  polyethylene glycol powder (GLYCOLAX/MIRALAX) powder  06/30/17   [provider]   Allergies  Allergen Reactions  . Penicillins Rash and Other (See Comments)  Has patient had a PCN reaction causing immediate rash, facial/tongue/throat swelling, SOB or lightheadedness with hypotension: Unknown Has patient had a PCN reaction causing severe rash involving mucus membranes or skin necrosis: Unknown Has patient had a PCN reaction that required hospitalization: Unknown Has patient had a PCN reaction occurring within the last 10 years: Unknown If all of the above answers are "NO", then may proceed with Cephalosporin use.    Review of Systems  Physical Exam  Cardiovascular: Normal rate and regular rhythm.  Pulmonary/Chest: Effort normal and breath sounds normal.  Neurological: He is alert.  Oriented to person and place  Skin: Skin is warm.  L foot wrapped  Psychiatric:  Pleasant, slowed cognition    Vital Signs: BP 133/61 (BP Location: Left Arm)   Pulse 81   Temp 97.9 F (36.6 C) (Oral)   Resp 18   Ht 5' 11"  (1.803 m)   Wt 83.5 kg (184 lb)   SpO2 97%   BMI 25.66 kg/m  Pain Assessment: 0-10   Pain Score: Asleep   SpO2: SpO2: 97 % O2 Device:SpO2: 97 % O2 Flow Rate: .   IO: Intake/output summary:   Intake/Output Summary (Last 24 hours) at 12/01/2017 1417 Last data filed at 12/01/2017 4712 Gross per 24 hour  Intake 1325 ml  Output 1200 ml  Net 125 ml    LBM: Last BM Date: 11/28/17 Baseline Weight: Weight: 83.5 kg (184 lb) Most recent weight: Weight: 83.5 kg (184 lb)     Palliative Assessment/Data: PPS: 30%     Thank you for this consult. Palliative medicine will continue to follow and assist as needed.   Time In: 1330 Time Out: 1440 Time Total: 70 minutes Greater than 50%  of this time was spent counseling  and coordinating care related to the above assessment and plan.  Signed by: Mariana Kaufman, AGNP-C Palliative Medicine    Please contact Palliative Medicine Team phone at [phone] for questions and concerns.  For individual provider: See Shea Evans

## 2017-12-01 NOTE — Plan of Care (Signed)
  Elimination: Will not experience complications related to bowel motility 12/01/2017 1021 - Progressing by Williams Che, RN   Pain Managment: General experience of comfort will improve 12/01/2017 1021 - Progressing by Williams Che, RN   Safety: Ability to remain free from injury will improve 12/01/2017 1021 - Progressing by Williams Che, RN   Skin Integrity: Risk for impaired skin integrity will decrease 12/01/2017 1021 - Progressing by Williams Che, RN

## 2017-12-02 DIAGNOSIS — I6522 Occlusion and stenosis of left carotid artery: Secondary | ICD-10-CM

## 2017-12-02 DIAGNOSIS — R5381 Other malaise: Secondary | ICD-10-CM

## 2017-12-02 MED ORDER — TRAMADOL HCL 50 MG PO TABS: 50.0000 mg | ORAL_TABLET | Freq: Three times a day (TID) | ORAL | 0 refills | Status: AC | PRN

## 2017-12-02 NOTE — Discharge Summary (Signed)
Physician Discharge Summary  Barry Wright EGB:[id number] DOB: 06-27-1920 DOA: 11/26/2017  PCP: Lajean Manes, MD  Admit date: 11/26/2017 Discharge date: 12/02/2017  Admitted From: [place] Disposition: [place] with home hospice   Recommendations for Outpatient Follow-up:  1. Follow up with home hospice  Home Health: Hospice Equipment/Devices: None new Discharge Condition: Stable for discharge CODE STATUS: DNR Diet recommendation: Heart healthy  Brief/Interim Summary: Barry Wright is a 84 year old with a history of left foot gangrene followed by podiatry, advanced Parkinson's disease, CVA, CAD status post CABG, stage III kidney disease, hypothyroidism, hypercholesterolemia and nephrectomy who was sent to the hospital for evaluation of change in mental status, fever and hypotension.  He was admitted for sepsis secondary to left foot gangrene mostly focused .  He was started on broad-spectrum antibiotics. Goals of care have been discussed throughout hospitalization and initially, orthopedics felt revascularization and/or amputation may be required, so cardiology was consulted for surgical clearance. Palliative care was consulted for ongoing discussions with family given his poor functional status and severe peripheral arterial disease. On 1/21, orthopedics performed debridement at the bedside with autoamputation of the 5th toe and no further interventions are planned. Infectious disease was consulted for further medical management recommendations and they've recommended no further antimicrobial therapy. He will be discharged home with hospice per goals of care discussions with palliative care during the hospitalization. He will need to continue every-other-day foot soaks in soapy water and to keep the area covered as recommended by surgery.   Discharge Diagnoses:  Principal Problem:   Gangrene of left foot ([place]) Active Problems:   Chronic kidney disease (CKD), stage III  (moderate) (HCC)   Polymyalgia rheumatica (HCC)   Carotid artery disease (HCC)   Hypokalemia   Essential hypertension, benign   Hypothyroidism   Physical deconditioning   Chronic diastolic heart failure (HCC)   Sepsis (HCC)   Parkinson disease ([place])   Advance care planning   Goals of care, counseling/discussion   Palliative care by specialist  Sepsis secondary to left foot gangrene and osteomyelitis:  - Orthopedics consulted and performed bedside debridement. ID recommends no further antibiotics.  - Plan is for aggressive local wound care, per orthopedics: "That would include soaking his left foot once once every other day for 15 - 20 minutes in antibacterial soapy water followed by placing xeroform over the wounds and new dry dressing. This could be repeated every other day" - Last controlled prescription (tramadol) per [place]MP[place] was June 2018. Will give Rx for short term course of this as needed. Otherwise, would defer to hospice.   Peripheral vascular disease: Right ABI 0.73, left TBI 0.32. Dr. Kennon Holter clinic note in Nov 2018 stated endovascular intervention was too high risk.  - He is at very high risk, and essentially not a candidate for revascularization or significant surgery per cardiology.  Acute delirium: Due to acute illness/infection. Improving. Will discharge patient.  History of polymyalgia rheumatica - Ok to restart MTX. Tapered stress steroids to prednisone home dose.   Chronic diastolic and systolic heart failure: Appears mildly hypovolemic, last EF 45% in 2015.  - Continue home medications  Hypothyroidism - Restarted synthroid.  Anemia chronic disease: No signs of acute blood loss or active blood loss at this time. - Monitor hgb.   Coronary artery disease/peripheral vascular disease - Restarted eliquis  Peripheral neuropathy - Restart gabapentin  Discharge Instructions Discharge Instructions    Diet - low sodium heart healthy   Complete by:  As  directed  Discharge instructions   Complete by:  As directed    Soak the left foot once once every other day for 15 - 20 minutes in antibacterial soapy water followed by placing xeroform over the wounds and new dry dressing. No further antibiotics were recommended since the area was debrided by orthopedic surgery. You may take tramadol as needed for pain.   [facility] are [facility] following.   Increase activity slowly   Complete by:  As directed      Allergies as of 12/02/2017      Reactions   Penicillins Rash, Other (See Comments)   Has patient had a PCN reaction causing immediate rash, facial/tongue/throat swelling, SOB or lightheadedness with hypotension: Unknown Has patient had a PCN reaction causing severe rash involving mucus membranes or skin necrosis: Unknown Has patient had a PCN reaction that required hospitalization: Unknown Has patient had a PCN reaction occurring within the last 10 years: Unknown If all of the above answers are "NO", then may proceed with Cephalosporin use.      Medication List    TAKE these medications   acetaminophen 500 MG tablet Commonly known as:  TYLENOL Take 1,000 mg by mouth 2 (two) times daily.   calcium-vitamin D 500-200 MG-UNIT tablet Take 1 tablet by mouth daily.   DELSYM COUGH CHILDRENS 30 MG/5ML liquid Generic drug:  dextromethorphan Take 30 mg by mouth every 12 (twelve) hours as needed for cough.   doxycycline 100 MG tablet Commonly known as:  VIBRA-TABS Take 1 tablet (100 mg total) by mouth 2 (two) times daily. What changed:  Another medication with the same name was removed. Continue taking this medication, and follow the directions you see here.   ELIQUIS 2.5 MG Tabs tablet Generic drug:  apixaban Take 2.5 mg by mouth 2 (two) times daily.   feeding supplement (ENSURE ENLIVE) Liqd Take 237 mLs by mouth 2 (two) times daily between meals.   FOLIC ACID PO Take 1 mg by mouth  daily.   furosemide 20 MG tablet Commonly known as:  LASIX Take 0.5 tablets (10 mg total) by mouth daily. Resume on Saturday 01/31/2017. What changed:    how much to take  additional instructions   gabapentin 100 MG capsule Commonly known as:  NEURONTIN Take 200 mg by mouth 3 (three) times daily.   methotrexate 2.5 MG tablet Commonly known as:  RHEUMATREX Take 10 mg by mouth every Wednesday.   multivitamin with minerals tablet Take 1 tablet by mouth daily.   mupirocin ointment 2 % Commonly known as:  BACTROBAN Apply to wound twice a day.   ondansetron 8 MG tablet Commonly known as:  ZOFRAN   polyethylene glycol powder powder Commonly known as:  GLYCOLAX/MIRALAX   predniSONE 5 MG tablet Commonly known as:  DELTASONE Take 5 mg by mouth daily.   SYNTHROID 100 MCG tablet Generic drug:  levothyroxine Take 100 mcg by mouth daily.   traMADol 50 MG tablet Commonly known as:  ULTRAM Take 1 tablet (50 mg total) by mouth every 8 (eight) hours as needed (pain). What changed:    how much to take  how to take this  when to take this  reasons to take this  additional instructions      Follow-up Information    Lajean Manes, MD Follow up.   Specialty:  Internal Medicine Contact information: [address] [place] [address] [place] [address] [phone]  Belva Crome, MD .   Specialty:  Cardiology Contact information: [phone] [place] [address] [phone]          Allergies  Allergen Reactions  . Penicillins Rash and Other (See Comments)    Has patient had a PCN reaction causing immediate rash, facial/tongue/throat swelling, SOB or lightheadedness with hypotension: Unknown Has patient had a PCN reaction causing severe rash involving mucus membranes or skin necrosis: Unknown Has patient had a PCN reaction that required hospitalization: Unknown Has patient had a PCN reaction occurring within the last 10 years:  Unknown If all of the above answers are "NO", then may proceed with Cephalosporin use.     Consultations:  Orthopedics  ID  Palliative Care Team  Procedures/Studies: Mr Foot Left Wo Contrast  Result Date: 11/29/2017 CLINICAL DATA:  84 year old admitted with sepsis, left foot gangrene and possible osteomyelitis. History stage 3 kidney disease, Parkinson's disease and altered mental status. EXAM: MRI OF THE LEFT FOOT WITHOUT CONTRAST TECHNIQUE: Multiplanar, multisequence MR imaging of the left forefoot was performed. No intravenous contrast was administered. COMPARISON:  None. FINDINGS: Despite efforts by the technologist and patient, moderate motion artifact is present on today's exam and could not be eliminated. This reduces exam sensitivity and specificity. Bones/Joint/Cartilage T1 weighted images demonstrate nonspecific signal changes in the neck of the 2nd proximal phalanx, the distal 4th phalanx and the middle 5th phalanx. These digits are suboptimally evaluated on the T2 weighted images due to motion. There is nonspecific decreased T1 and increased T2 signal medially in the head of the 1st metatarsal without adjacent joint effusion or focal soft tissue abnormality. No other significant arthropathy or joint effusion demonstrated. Ligaments The Lisfranc ligament is intact. Muscles and Tendons Mild generalized forefoot muscular edema. No focal fluid collection or significant tenosynovitis. Soft tissues Generalized forefoot soft tissue edema, especially in the dorsal subcutaneous fat. No focal fluid collection or skin ulceration identified. IMPRESSION: 1. Generalized soft tissue edema in the forefoot suspicious for cellulitis/myositis. No focal abscess or skin ulceration identified. 2. Nonspecific marrow changes are demonstrated in multiple bones, including the head of the 1st metatarsal (likely arthropathic), the neck of the 2nd proximal phalanx, the distal 4th phalanx and middle 5th phalanx. Plain  film correlation recommended as evaluated is limited by motion on this study. Electronically Signed   By: Richardean Sale M.D.   On: 11/29/2017 09:15   Dg Chest Portable 1 View  Result Date: 11/26/2017 CLINICAL DATA:  84 year old male with lethargy.  Fever and hypoxia. EXAM: PORTABLE CHEST 1 VIEW COMPARISON:  05/27/2017 and earlier. FINDINGS: Portable AP semi upright view at 0753 hr. Stable lung volumes. Stable cardiac size and mediastinal contours. Sequelae of CABG. No pneumothorax or pulmonary edema. Chronic blunting of the left costophrenic angle is unchanged since 2,017. No acute pleural effusion or acute pulmonary opacity. Negative visible bowel gas pattern. Stable cholecystectomy clips. Calcified aortic atherosclerosis. Visualized tracheal air column is within normal limits. IMPRESSION: 1.  No acute cardiopulmonary abnormality. 2.  Aortic Atherosclerosis (ICD10-I70.0). Electronically Signed   By: Genevie Ann M.D.   On: 11/26/2017 08:27   Subjective: Sleeping soundly but rouses to loud voice. No complaints of pain. Wants to go home. Has been eating.   Discharge Exam: Vitals:   12/02/17 0500 12/02/17 1306  BP: (!) 150/62 (!) 151/56  Pulse: (!) 55 66  Resp: 20 17  Temp: 98.6 F (37 C) 98.7 F (37.1 C)  SpO2: 97% 98%   Gen: Elderly  male in NAD HEENT: MMM, posterior oropharynx clear Pulm: Non-labored; CTAB, no wheezes  CV: Regular rate, no murmur appreciated; distal pulses intact/symmetric GI: + BS; soft, non-tender, non-distended Skin: Left 5th toe now missing with serosanguinous discharge. Malodorous discharge resolved. No surrounding erythema/tenderness.  Neuro: Alert, HOH, remains oriented x 3, but unable to comprehend complex medical decision making issues. No focal deficits noted.   Labs: Basic Metabolic Panel: Recent Labs  Lab 11/26/17 0817 11/26/17 1140 11/27/17 0719 11/29/17 1026 12/01/17 0811  NA 143  --  145 142 142  K 3.0*  --  3.7 3.8 3.7  CL 105  --  113* 113* 111   CO2 26  --  18* 18* 19*  GLUCOSE 122*  --  126* 174* 104*  BUN 32*  --  32* 36* 36*  CREATININE 1.78* 1.72* 1.46* 1.58* 1.47*  CALCIUM 9.2  --  7.9* 7.6* 7.6*   Liver Function Tests: Recent Labs  Lab 11/26/17 0817  AST 25  ALT 13*  ALKPHOS 66  BILITOT 1.1  PROT 5.5*  ALBUMIN 2.9*   CBC: Recent Labs  Lab 11/26/17 0817 11/26/17 1140 11/27/17 0719  WBC 20.5* 17.8* 12.2*  NEUTROABS 16.7*  --   --   HGB 11.1* 10.3* 9.0*  HCT 34.0* 31.4* 27.1*  MCV 105.6* 106.4* 105.4*  PLT 138* 123* 129*   CBG: Recent Labs  Lab 11/26/17 1411  GLUCAP 118*   Urinalysis    Component Value Date/Time   COLORURINE YELLOW 11/26/2017 1026   APPEARANCEUR CLEAR 11/26/2017 1026   LABSPEC <1.005 (L) 11/26/2017 1026   PHURINE 5.0 11/26/2017 1026   GLUCOSEU NEGATIVE 11/26/2017 1026   HGBUR SMALL (A) 11/26/2017 1026   BILIRUBINUR NEGATIVE 11/26/2017 1026   KETONESUR NEGATIVE 11/26/2017 1026   PROTEINUR NEGATIVE 11/26/2017 1026   UROBILINOGEN 0.2 04/11/2015 1437   NITRITE NEGATIVE 11/26/2017 [place] 11/26/2017 1026    Microbiology Recent Results (from the past 240 hour(s))  Blood culture (routine x 2)     Status: None   Collection Time: 11/26/17  8:17 AM  Result Value Ref Range Status   Specimen Description BLOOD RIGHT ANTECUBITAL  Final   Special Requests IN PEDIATRIC BOTTLE Blood Culture adequate volume  Final   Culture NO GROWTH 5 DAYS  Final   Report Status 12/01/2017 FINAL  Final  Blood culture (routine x 2)     Status: None   Collection Time: 11/26/17  8:25 AM  Result Value Ref Range Status   Specimen Description BLOOD RIGHT FOREARM  Final   Special Requests   Final    BOTTLES DRAWN AEROBIC AND ANAEROBIC Blood Culture adequate volume   Culture NO GROWTH 5 DAYS  Final   Report Status 12/01/2017 FINAL  Final  Urine culture     Status: None   Collection Time: 11/26/17 10:26 AM  Result Value Ref Range Status   Specimen Description URINE, CATHETERIZED  Final    Special Requests NONE  Final   Culture NO GROWTH  Final   Report Status 11/27/2017 FINAL  Final    Time coordinating discharge: Approximately 40 minutes  Vance Gather, MD  [name] 12/02/2017, 2:39 PM Pager [phone]

## 2017-12-02 NOTE — Social Work (Signed)
Clinical Social Worker facilitated patient discharge including contacting patient family and facility to confirm patient discharge plans.  Clinical information faxed to facility and family agreeable with plan.    CSW arranged ambulance transport via [facility] to [facility].    RN to call [phone] (please ask for [facility]) to give report prior to discharge.  Clinical Social Worker will sign off for now as social work intervention is no longer needed. Please consult [place] again if new need arises.  Elissa Hefty, LCSW Clinical Social Worker [phone]

## 2017-12-02 NOTE — Progress Notes (Signed)
   12/02/17 1000  Clinical Encounter Type  Visited With Patient  Visit Type Initial  Consult/Referral To Chaplain  Spiritual Encounters  Spiritual Needs Prayer  Stress Factors  Patient Stress Factors Major life changes  Chaplain rounded with PT.  The PT was watching TV at the time and Chaplain had to speak loudly in order for the PT to hear.  Chaplain knew the PT situation from progression meeting and was checking on the wellness status of the PT.  PT did not ask for services but Chaplain did pray for the PT.

## 2017-12-02 NOTE — Plan of Care (Signed)
  Nutrition: Adequate nutrition will be maintained 12/02/2017 1118 - Progressing by Williams Che, RN   Elimination: Will not experience complications related to bowel motility 12/02/2017 1118 - Progressing by Williams Che, RN   Pain Managment: General experience of comfort will improve 12/02/2017 1118 - Progressing by Williams Che, RN   Safety: Ability to remain free from injury will improve 12/02/2017 1118 - Progressing by Williams Che, RN

## 2017-12-02 NOTE — Progress Notes (Signed)
   No further recommendations.  Please call with questions.

## 2017-12-02 NOTE — Progress Notes (Signed)
Daily Progress Note   Patient Name: Barry Wright       Date: 12/02/2017 DOB: Jul 08, 1920  Age: 84 y.o. MRN#: [id number] Attending Physician: Barry Pour, MD Primary Care Physician: Barry Manes, MD Admit Date: 11/26/2017  Reason for Consultation/Follow-up: Establishing goals of care  Subjective: Patient in bed. Denies pain. Barry Wright is at bedside- does not remember me from yesterday- angrily tells me she is his wife and she doesn't know what is going on.  Patient notes he has been told he is discharging. Spoke with Barry Wright via phone- she requests Hospice services for patient when he is discharged.  Review of Systems  All other systems reviewed and are negative.   Length of Stay: 6  Current Medications: Scheduled Meds:  . apixaban  2.5 mg Oral BID  . folic acid  1 mg Oral Daily  . gabapentin  200 mg Oral TID  . levothyroxine  100 mcg Oral QAC breakfast  . predniSONE  5 mg Oral Q breakfast    Continuous Infusions:   PRN Meds: acetaminophen **OR** acetaminophen, bisacodyl, polyvinyl alcohol, traMADol  Physical Exam  Constitutional: He appears well-developed and well-nourished.  Cardiovascular: Normal rate.  Pulmonary/Chest: Effort normal.  Neurological: He is alert.  Skin: Skin is warm.  Nursing note and vitals reviewed.           Vital Signs: BP (!) 151/56 (BP Location: Right Arm)   Pulse 66   Temp 98.7 F (37.1 C) (Oral)   Resp 17   Ht 5\' 11"  (1.803 m)   Wt 83.5 kg (184 lb)   SpO2 98%   BMI 25.66 kg/m  SpO2: SpO2: 98 % O2 Device: O2 Device: Not Delivered O2 Flow Rate:    Intake/output summary:   Intake/Output Summary (Last 24 hours) at 12/02/2017 1443 Last data filed at 12/02/2017 1306 Gross per 24 hour  Intake 596 ml  Output 900 ml  Net -304 ml   LBM:  Last BM Date: 11/28/17 Baseline Weight: Weight: 83.5 kg (184 lb) Most recent weight: Weight: 83.5 kg (184 lb)       Palliative Assessment/Data: PPS: 30%      Patient Active Problem List   Diagnosis Date Noted  . Advance care planning   . Goals of care, counseling/discussion   . Palliative care by specialist   .  Gangrene of left foot ([place]) 11/26/2017  . Parkinson disease ([place]) 11/26/2017  . Critical lower limb ischemia 09/29/2017  . Sepsis ([place]) 05/27/2017  . Lower urinary tract infectious disease   . History of nephrectomy   . Acute lower UTI 02/21/2016  . Pressure ulcer 09/29/2015  . Acute encephalopathy   . Chronic back pain 09/26/2015  . Generalized weakness 04/13/2015  . Chronic diastolic heart failure ([place]) 06/19/2014  . Essential hypertension, benign 03/08/2014  . Hypothyroidism 03/08/2014  . Protein-calorie malnutrition, moderate ([place]) 03/08/2014  . Physical deconditioning 03/08/2014  . Hypokalemia 02/27/2014  . Abnormal serum protein electrophoresis 01/07/2013  . Chronic Wright disease (CKD), stage III (moderate) ([place]) 01/03/2013  . Polymyalgia rheumatica ([place]) 01/03/2013  . Osteopenia 01/03/2013  . Carotid artery disease ([place]) 01/03/2013  . Spinal stenosis of lumbar region 01/03/2013  . Coronary artery disease 01/03/2013  . Old MI (myocardial infarction) 01/03/2013  . Transient cerebral ischemia 11/18/2011    Palliative Care Assessment & Plan   Patient Profile: 84 y.o. male  with past medical history of CKD, polymyalgia rheumatica, hypothyroidism, CHF, CVA, CAD s/p CABG admitted on 11/26/2017 with altered mental status. Workup revealed sepsis due to gangrenous toes and foot with osteomyelitis likely. Amputation, revascularization procedures have been recommended. Cardiology has deemed him high risk for any surgical interventions. Amputation alone is unlikely to improve status without revascularization as he would likely not heal. Palliative medicine consulted  for [place].     Assessment/Recommendations/Plan   Home with Hospice  Goals of Care and Additional Recommendations:  Limitations on Scope of Treatment: No Surgical Procedures  Code Status:  DNR  Prognosis:   < 6 months due to significantly decreased functional status, gangrenous foot, likely osteomyelitis- no plan for amputation, no surgical interventions- plan for comfort when sepsis recurs again  Discharge Planning:  Home with Hospice  Care plan was discussed with patient's daugther, HCPOA- Barry Wright.  Thank you for allowing the Palliative Medicine Team to assist in the care of this patient.   Time In: 1410 Time Out: 1450 Total Time 40 mins Prolonged Time Billed no      Greater than 50%  of this time was spent counseling and coordinating care related to the above assessment and plan.  Barry Wright, AGNP-C Palliative Medicine   Please contact Palliative Medicine Team phone at [phone] for questions and concerns.

## 2017-12-02 NOTE — Clinical Social Work Note (Signed)
Clinical Social Work Assessment  Patient Details  Name: Barry Wright MRN: [id number] Date of Birth: 1920/01/29  Date of referral:  12/02/17               Reason for consult:  Facility Placement                Permission sought to share information with:  Chartered certified accountant granted to share information::  Yes, Verbal Permission Granted  Name::        Agency::  [facility]  Relationship::     Contact Information:     Housing/Transportation Living arrangements for the past 2 months:  [place] of Information:  Patient, Adult Children Patient Interpreter Needed:  None Criminal Activity/Legal Involvement Pertinent to Current Situation/Hospitalization:  No - Comment as needed Significant Relationships:  Adult Children, Spouse Lives with:  Facility Resident Do you feel safe going back to the place where you live?  Yes Need for family participation in patient care:  Yes (Comment)  Care giving concerns:  Pt from [place]. Pt will return back to facility for continued care. No recommendations for short term rehab.  CSW met with patient and spouse at bedside and advised of same.   Social Worker assessment / plan:  CSW will assist with disposition.  Employment status:  Retired Forensic scientist:  Medicare PT Recommendations:  No Follow Up, Not assessed at this time Information / Referral to community resources:  Other (Comment Required)([facility])  Patient/Family's Response to care:  Pt/spouse reports no issues with care. They are in agreement with returning to [facility].  Patient/Family's Understanding of and Emotional Response to Diagnosis, Current Treatment, and Prognosis:  Patient/spouse has good understanding of diagnosis and are in agreement with returning to [facility].  [facility] will provide care and make referral for home hospice to follow patient while he is in facility. No other  issues or concerns.  Emotional Assessment Appearance:  Appears stated age Attitude/Demeanor/Rapport:  (Cooperative) Affect (typically observed):  Accepting, Appropriate Orientation:  Oriented to Self, Oriented to Place, Oriented to  Time Alcohol / Substance use:  Not Applicable Psych involvement (Current and /or in the community):  No (Comment)  Discharge Needs  Concerns to be addressed:  Discharge Planning Concerns Readmission within the last 30 days:  No Current discharge risk:  Dependent with Mobility, Physical Impairment Barriers to Discharge:  No Barriers Identified   Normajean Baxter, LCSW 12/02/2017, 2:59 PM

## 2017-12-02 NOTE — Progress Notes (Signed)
Removed IV, wife at bedside, all questions and concerns addressed, Pt not in distress. Called daughter Neoma Laming) and informed her about pending discharge, called facility and gave report to [facility]. Pt to discharge via [facility] with all of his belongings.

## 2017-12-02 NOTE — Clinical Social Work Placement (Signed)
   CLINICAL SOCIAL WORK PLACEMENT  NOTE  Date:  12/02/2017  Patient Details  Name: Barry Wright MRN: [id number] Date of Birth: 01-09-1920  Clinical Social Work is seeking post-discharge placement for this patient at the [place] level of care (*CSW will initial, date and re-position this form in  chart as items are completed):  Yes   Patient/family provided with [place] Work Department's list of facilities offering this level of care within the geographic area requested by the patient (or if unable, by the patient's family).  Yes   Patient/family informed of their freedom to choose among providers that offer the needed level of care, that participate in Medicare, Medicaid or managed care program needed by the patient, have an available bed and are willing to accept the patient.  Yes   Patient/family informed of [facility]'s ownership interest in [facility] and [facility], as well as of the fact that they are under no obligation to receive care at these facilities.  PASRR submitted to EDS on       PASRR number received on       Existing PASRR number confirmed on 12/02/17     FL2 transmitted to all facilities in geographic area requested by pt/family on       FL2 transmitted to all facilities within larger geographic area on 12/02/17     Patient informed that his/her managed care company has contracts with or will negotiate with certain facilities, including the following:        Yes   Patient/family informed of bed offers received.  Patient chooses bed at [facility]     Physician recommends and patient chooses bed at      Patient to be transferred to [facility] on 12/02/17.  Patient to be transferred to facility by [facility]     Patient family notified on 12/02/17 of transfer.  Name of family member notified:  daughter, spouse at bedside     PHYSICIAN       Additional Comment:     _______________________________________________ Normajean Baxter, LCSW 12/02/2017, 3:17 PM

## 2017-12-02 NOTE — NC FL2 (Signed)
[place] MEDICAID FL2 LEVEL OF CARE SCREENING TOOL     IDENTIFICATION  Patient Name: Barry Wright Birthdate: [birth year] Sex: male Admission Date (Current Location): 11/26/2017  [facility] and [place] Number:  Herbalist and Address:  [facility], [place] [address], [place], [place] [address]      Provider Number: [id number]  Attending Physician Name and Address:  Patrecia Pour, MD  Relative Name and Phone Number:  Darin Engels, daughter, [phone]    Current Level of Care: Hospital Recommended Level of Care: [place] Prior Approval Number:    Date Approved/Denied:   PASRR Number: [id number]  Discharge Plan: SNF    Current Diagnoses: Patient Active Problem List   Diagnosis Date Noted  . Advance care planning   . Goals of care, counseling/discussion   . Palliative care by specialist   . Gangrene of left foot ([place]) 11/26/2017  . Parkinson disease ([place]) 11/26/2017  . Critical lower limb ischemia 09/29/2017  . Sepsis ([place]) 05/27/2017  . Lower urinary tract infectious disease   . History of nephrectomy   . Acute lower UTI 02/21/2016  . Pressure ulcer 09/29/2015  . Acute encephalopathy   . Chronic back pain 09/26/2015  . Generalized weakness 04/13/2015  . Chronic diastolic heart failure ([place]) 06/19/2014  . Essential hypertension, benign 03/08/2014  . Hypothyroidism 03/08/2014  . Protein-calorie malnutrition, moderate ([place]) 03/08/2014  . Physical deconditioning 03/08/2014  . Hypokalemia 02/27/2014  . Abnormal serum protein electrophoresis 01/07/2013  . Chronic kidney disease (CKD), stage III (moderate) ([place]) 01/03/2013  . Polymyalgia rheumatica ([place]) 01/03/2013  . Osteopenia 01/03/2013  . Carotid artery disease ([place]) 01/03/2013  . Spinal stenosis of lumbar region 01/03/2013  . Coronary artery disease 01/03/2013  . Old MI (myocardial infarction) 01/03/2013  . Transient cerebral ischemia 11/18/2011     Orientation RESPIRATION BLADDER Height & Weight     Self, Time, Place  Normal Incontinent Weight: 184 lb (83.5 kg) Height:  5\' 11"  (180.3 cm)  BEHAVIORAL SYMPTOMS/MOOD NEUROLOGICAL BOWEL NUTRITION STATUS      Incontinent Diet  AMBULATORY STATUS COMMUNICATION OF NEEDS Skin   Extensive Assist Verbally Surgical wounds                       Personal Care Assistance Level of Assistance  Bathing, Feeding, Dressing Bathing Assistance: Maximum assistance Feeding assistance: Limited assistance Dressing Assistance: Maximum assistance     Functional Limitations Info  Sight, Hearing, Speech Sight Info: Adequate Hearing Info: Impaired Speech Info: Adequate    SPECIAL CARE FACTORS FREQUENCY        PT Frequency: none OT Frequency: none            Contractures      Additional Factors Info  Code Status, Allergies Code Status Info: DNR Allergies Info: PENICILLINS            Current Medications (12/02/2017):  This is the current hospital active medication list Current Facility-Administered Medications  Medication Dose Route Frequency Provider Last Rate Last Dose  . acetaminophen (TYLENOL) tablet 650 mg  650 mg Oral Q6H PRN Dionne Milo, NP   650 mg at 12/01/17 1550   Or  . acetaminophen (TYLENOL) suppository 650 mg  650 mg Rectal Q6H PRN Dionne Milo, NP      . apixaban Arne [place]) tablet 2.5 mg  2.5 mg Oral BID Amin, Ankit Chirag, MD   2.5 mg at 12/02/17 6712  . bisacodyl (DULCOLAX) EC  tablet 5 mg  5 mg Oral Daily PRN Dionne Milo, NP   5 mg at 12/02/17 0713  . folic acid (FOLVITE) tablet 1 mg  1 mg Oral Daily Amin, Ankit Chirag, MD   1 mg at 12/02/17 0832  . gabapentin (NEURONTIN) capsule 200 mg  200 mg Oral TID Damita Lack, MD   200 mg at 12/02/17 0832  . levothyroxine (SYNTHROID, LEVOTHROID) tablet 100 mcg  100 mcg Oral QAC breakfast Amin, Ankit Chirag, MD   100 mcg at 12/02/17 0832  . polyvinyl alcohol (LIQUIFILM TEARS) 1.4 % ophthalmic  solution 1 drop  1 drop Both Eyes PRN Amin, Ankit Chirag, MD   1 drop at 11/28/17 1542  . predniSONE (DELTASONE) tablet 5 mg  5 mg Oral Q breakfast Patrecia Pour, MD   5 mg at 12/02/17 1216  . traMADol (ULTRAM) tablet 50 mg  50 mg Oral Q8H PRN Damita Lack, MD   50 mg at 12/01/17 1121     Discharge Medications: Please see discharge summary for a list of discharge medications.  Relevant Imaging Results:  Relevant Lab Results:   Additional Information SS#: [id number]  Normajean Baxter, LCSW

## 2017-12-03 DIAGNOSIS — E039 Hypothyroidism, unspecified: Secondary | ICD-10-CM | POA: Diagnosis not present

## 2017-12-03 DIAGNOSIS — I350 Nonrheumatic aortic (valve) stenosis: Secondary | ICD-10-CM | POA: Diagnosis not present

## 2017-12-03 DIAGNOSIS — N183 Chronic kidney disease, stage 3 (moderate): Secondary | ICD-10-CM | POA: Diagnosis not present

## 2017-12-03 DIAGNOSIS — I509 Heart failure, unspecified: Secondary | ICD-10-CM | POA: Diagnosis not present

## 2017-12-03 DIAGNOSIS — I25810 Atherosclerosis of coronary artery bypass graft(s) without angina pectoris: Secondary | ICD-10-CM | POA: Diagnosis not present

## 2017-12-03 DIAGNOSIS — G20 Parkinson's disease: Secondary | ICD-10-CM | POA: Diagnosis not present

## 2017-12-03 DIAGNOSIS — M353 Polymyalgia rheumatica: Secondary | ICD-10-CM | POA: Diagnosis not present

## 2017-12-03 DIAGNOSIS — G3184 Mild cognitive impairment, so stated: Secondary | ICD-10-CM | POA: Diagnosis not present

## 2017-12-03 DIAGNOSIS — G9009 Other idiopathic peripheral autonomic neuropathy: Secondary | ICD-10-CM | POA: Diagnosis not present

## 2017-12-03 DIAGNOSIS — I70263 Atherosclerosis of native arteries of extremities with gangrene, bilateral legs: Secondary | ICD-10-CM | POA: Diagnosis not present

## 2017-12-03 DIAGNOSIS — I10 Essential (primary) hypertension: Secondary | ICD-10-CM | POA: Diagnosis not present

## 2017-12-04 DIAGNOSIS — I509 Heart failure, unspecified: Secondary | ICD-10-CM | POA: Diagnosis not present

## 2017-12-04 DIAGNOSIS — I350 Nonrheumatic aortic (valve) stenosis: Secondary | ICD-10-CM | POA: Diagnosis not present

## 2017-12-04 DIAGNOSIS — G20 Parkinson's disease: Secondary | ICD-10-CM | POA: Diagnosis not present

## 2017-12-04 DIAGNOSIS — N183 Chronic kidney disease, stage 3 (moderate): Secondary | ICD-10-CM | POA: Diagnosis not present

## 2017-12-04 DIAGNOSIS — I70263 Atherosclerosis of native arteries of extremities with gangrene, bilateral legs: Secondary | ICD-10-CM | POA: Diagnosis not present

## 2017-12-04 DIAGNOSIS — I25810 Atherosclerosis of coronary artery bypass graft(s) without angina pectoris: Secondary | ICD-10-CM | POA: Diagnosis not present

## 2017-12-06 DIAGNOSIS — G20 Parkinson's disease: Secondary | ICD-10-CM | POA: Diagnosis not present

## 2017-12-06 DIAGNOSIS — N183 Chronic kidney disease, stage 3 (moderate): Secondary | ICD-10-CM | POA: Diagnosis not present

## 2017-12-06 DIAGNOSIS — I509 Heart failure, unspecified: Secondary | ICD-10-CM | POA: Diagnosis not present

## 2017-12-06 DIAGNOSIS — I25810 Atherosclerosis of coronary artery bypass graft(s) without angina pectoris: Secondary | ICD-10-CM | POA: Diagnosis not present

## 2017-12-06 DIAGNOSIS — I70263 Atherosclerosis of native arteries of extremities with gangrene, bilateral legs: Secondary | ICD-10-CM | POA: Diagnosis not present

## 2017-12-06 DIAGNOSIS — I350 Nonrheumatic aortic (valve) stenosis: Secondary | ICD-10-CM | POA: Diagnosis not present

## 2017-12-07 DIAGNOSIS — I25810 Atherosclerosis of coronary artery bypass graft(s) without angina pectoris: Secondary | ICD-10-CM | POA: Diagnosis not present

## 2017-12-07 DIAGNOSIS — I350 Nonrheumatic aortic (valve) stenosis: Secondary | ICD-10-CM | POA: Diagnosis not present

## 2017-12-07 DIAGNOSIS — I70263 Atherosclerosis of native arteries of extremities with gangrene, bilateral legs: Secondary | ICD-10-CM | POA: Diagnosis not present

## 2017-12-07 DIAGNOSIS — G20 Parkinson's disease: Secondary | ICD-10-CM | POA: Diagnosis not present

## 2017-12-07 DIAGNOSIS — I509 Heart failure, unspecified: Secondary | ICD-10-CM | POA: Diagnosis not present

## 2017-12-07 DIAGNOSIS — N183 Chronic kidney disease, stage 3 (moderate): Secondary | ICD-10-CM | POA: Diagnosis not present

## 2017-12-08 ENCOUNTER — Ambulatory Visit (INDEPENDENT_AMBULATORY_CARE_PROVIDER_SITE_OTHER): Payer: Medicare Other | Admitting: Podiatry

## 2017-12-08 DIAGNOSIS — I739 Peripheral vascular disease, unspecified: Secondary | ICD-10-CM | POA: Diagnosis not present

## 2017-12-08 DIAGNOSIS — I96 Gangrene, not elsewhere classified: Secondary | ICD-10-CM

## 2017-12-08 NOTE — Patient Instructions (Signed)
Continue betadine to the wound daily followed by adaptic and a dry sterile dressing.  Continue doxycycline Monitor for any signs/symptoms of infection. Call the office immediately if any occur or go directly to the emergency room. Call with any questions/concerns.

## 2017-12-09 ENCOUNTER — Telehealth: Payer: Self-pay | Admitting: Podiatry

## 2017-12-09 DIAGNOSIS — N183 Chronic kidney disease, stage 3 (moderate): Secondary | ICD-10-CM | POA: Diagnosis not present

## 2017-12-09 DIAGNOSIS — I25810 Atherosclerosis of coronary artery bypass graft(s) without angina pectoris: Secondary | ICD-10-CM | POA: Diagnosis not present

## 2017-12-09 DIAGNOSIS — I70263 Atherosclerosis of native arteries of extremities with gangrene, bilateral legs: Secondary | ICD-10-CM | POA: Diagnosis not present

## 2017-12-09 DIAGNOSIS — I350 Nonrheumatic aortic (valve) stenosis: Secondary | ICD-10-CM | POA: Diagnosis not present

## 2017-12-09 DIAGNOSIS — G20 Parkinson's disease: Secondary | ICD-10-CM | POA: Diagnosis not present

## 2017-12-09 DIAGNOSIS — I509 Heart failure, unspecified: Secondary | ICD-10-CM | POA: Diagnosis not present

## 2017-12-09 NOTE — Telephone Encounter (Signed)
Left message informing [place], Dr. Jacqualyn Posey would like the betadine wet to dry dressings to be performed every other day.

## 2017-12-09 NOTE — Progress Notes (Signed)
Subjective: 84 year old male presents the office with his caregiver for follow-up evaluation of a wound/gangrene to the left foot.  He states that overall he is doing better he denies any increase in swelling or redness to his foot.  He is asking why he is to keep going the office.  He has no new concerns.  Denies any systemic complaints such as fevers, chills, nausea, vomiting. No acute changes since last appointment, and no other complaints at this time.   Objective: NAD; presents in a wheelchair with caregiver DP/PT pulses decreased bilaterally The wound to the dorsal aspect of the left second toe appears to be healing and a small scab is present although there is no drainage or pus or any fluctuation or crepitation.  No significant swelling or redness. The area the previous x-ray on the medial aspect the foot has a granular, fibrotic wound base.  There is no probing, undermining or tunneling.  No surrounding erythema, ascending sialitis.  No fluctuation or crepitation. Small eschar on the plantar left fourth digit although minimal today. 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  No surrounding erythema, ascending sialitis.  There is no fluctuation or crepitation.  No malodor. No other open lesions or pre-ulcerative lesions identified today. There is no pain with calf compression, swelling, warmth, erythema.        Assessment: Large wound with gangrene left foot with PAD  Plan: -All treatment options discussed with the patient including all alternatives, risks, complications.  -Since last appointment there is been auto amputation of the fifth toe was also indicated which I discussed with the caregiver last appointment.  Continue Betadine wet-to-dry dressing changes for now.  Continue with doxycycline.  Monitor for any signs or symptoms of infection go to the ER should any occur.  I will see him back  in 2 weeks or sooner if needed.  Again declines to be seen at the wound care center or any other specialty.  Trula Slade DPM

## 2017-12-09 NOTE — Telephone Encounter (Signed)
This is Barry Wright with [facility]. My number is [phone]. Barry Wright was seen by Dr. Jacqualyn Posey yesterday. The orders that came back for betadine wet to dry followed by non-adherent dressing and gauze, I was calling to see if you wanted this done daily or every other day as we were doing this every other day on him. I just need to get clarification on that. Thank you.

## 2017-12-11 DIAGNOSIS — I350 Nonrheumatic aortic (valve) stenosis: Secondary | ICD-10-CM | POA: Diagnosis not present

## 2017-12-11 DIAGNOSIS — I25810 Atherosclerosis of coronary artery bypass graft(s) without angina pectoris: Secondary | ICD-10-CM | POA: Diagnosis not present

## 2017-12-11 DIAGNOSIS — G20 Parkinson's disease: Secondary | ICD-10-CM | POA: Diagnosis not present

## 2017-12-11 DIAGNOSIS — N183 Chronic kidney disease, stage 3 (moderate): Secondary | ICD-10-CM | POA: Diagnosis not present

## 2017-12-11 DIAGNOSIS — G3184 Mild cognitive impairment, so stated: Secondary | ICD-10-CM | POA: Diagnosis not present

## 2017-12-11 DIAGNOSIS — M353 Polymyalgia rheumatica: Secondary | ICD-10-CM | POA: Diagnosis not present

## 2017-12-11 DIAGNOSIS — E039 Hypothyroidism, unspecified: Secondary | ICD-10-CM | POA: Diagnosis not present

## 2017-12-11 DIAGNOSIS — I70263 Atherosclerosis of native arteries of extremities with gangrene, bilateral legs: Secondary | ICD-10-CM | POA: Diagnosis not present

## 2017-12-11 DIAGNOSIS — I509 Heart failure, unspecified: Secondary | ICD-10-CM | POA: Diagnosis not present

## 2017-12-11 DIAGNOSIS — G9009 Other idiopathic peripheral autonomic neuropathy: Secondary | ICD-10-CM | POA: Diagnosis not present

## 2017-12-11 DIAGNOSIS — I10 Essential (primary) hypertension: Secondary | ICD-10-CM | POA: Diagnosis not present

## 2017-12-15 DIAGNOSIS — I70263 Atherosclerosis of native arteries of extremities with gangrene, bilateral legs: Secondary | ICD-10-CM | POA: Diagnosis not present

## 2017-12-15 DIAGNOSIS — N183 Chronic kidney disease, stage 3 (moderate): Secondary | ICD-10-CM | POA: Diagnosis not present

## 2017-12-15 DIAGNOSIS — I509 Heart failure, unspecified: Secondary | ICD-10-CM | POA: Diagnosis not present

## 2017-12-15 DIAGNOSIS — G20 Parkinson's disease: Secondary | ICD-10-CM | POA: Diagnosis not present

## 2017-12-15 DIAGNOSIS — I350 Nonrheumatic aortic (valve) stenosis: Secondary | ICD-10-CM | POA: Diagnosis not present

## 2017-12-15 DIAGNOSIS — I25810 Atherosclerosis of coronary artery bypass graft(s) without angina pectoris: Secondary | ICD-10-CM | POA: Diagnosis not present

## 2017-12-22 ENCOUNTER — Ambulatory Visit: Payer: Medicare Other | Admitting: Podiatry

## 2017-12-22 DIAGNOSIS — I70263 Atherosclerosis of native arteries of extremities with gangrene, bilateral legs: Secondary | ICD-10-CM | POA: Diagnosis not present

## 2017-12-22 DIAGNOSIS — I350 Nonrheumatic aortic (valve) stenosis: Secondary | ICD-10-CM | POA: Diagnosis not present

## 2017-12-22 DIAGNOSIS — I509 Heart failure, unspecified: Secondary | ICD-10-CM | POA: Diagnosis not present

## 2017-12-22 DIAGNOSIS — I25810 Atherosclerosis of coronary artery bypass graft(s) without angina pectoris: Secondary | ICD-10-CM | POA: Diagnosis not present

## 2017-12-22 DIAGNOSIS — G20 Parkinson's disease: Secondary | ICD-10-CM | POA: Diagnosis not present

## 2017-12-22 DIAGNOSIS — N183 Chronic kidney disease, stage 3 (moderate): Secondary | ICD-10-CM | POA: Diagnosis not present

## 2017-12-23 DIAGNOSIS — I25810 Atherosclerosis of coronary artery bypass graft(s) without angina pectoris: Secondary | ICD-10-CM | POA: Diagnosis not present

## 2017-12-23 DIAGNOSIS — I70263 Atherosclerosis of native arteries of extremities with gangrene, bilateral legs: Secondary | ICD-10-CM | POA: Diagnosis not present

## 2017-12-23 DIAGNOSIS — G20 Parkinson's disease: Secondary | ICD-10-CM | POA: Diagnosis not present

## 2017-12-23 DIAGNOSIS — I509 Heart failure, unspecified: Secondary | ICD-10-CM | POA: Diagnosis not present

## 2017-12-23 DIAGNOSIS — N183 Chronic kidney disease, stage 3 (moderate): Secondary | ICD-10-CM | POA: Diagnosis not present

## 2017-12-23 DIAGNOSIS — I350 Nonrheumatic aortic (valve) stenosis: Secondary | ICD-10-CM | POA: Diagnosis not present

## 2017-12-24 DIAGNOSIS — I25810 Atherosclerosis of coronary artery bypass graft(s) without angina pectoris: Secondary | ICD-10-CM | POA: Diagnosis not present

## 2017-12-24 DIAGNOSIS — I350 Nonrheumatic aortic (valve) stenosis: Secondary | ICD-10-CM | POA: Diagnosis not present

## 2017-12-24 DIAGNOSIS — I70263 Atherosclerosis of native arteries of extremities with gangrene, bilateral legs: Secondary | ICD-10-CM | POA: Diagnosis not present

## 2017-12-24 DIAGNOSIS — I509 Heart failure, unspecified: Secondary | ICD-10-CM | POA: Diagnosis not present

## 2017-12-24 DIAGNOSIS — N183 Chronic kidney disease, stage 3 (moderate): Secondary | ICD-10-CM | POA: Diagnosis not present

## 2017-12-24 DIAGNOSIS — G20 Parkinson's disease: Secondary | ICD-10-CM | POA: Diagnosis not present

## 2017-12-25 DIAGNOSIS — G20 Parkinson's disease: Secondary | ICD-10-CM | POA: Diagnosis not present

## 2017-12-25 DIAGNOSIS — I509 Heart failure, unspecified: Secondary | ICD-10-CM | POA: Diagnosis not present

## 2017-12-25 DIAGNOSIS — I70263 Atherosclerosis of native arteries of extremities with gangrene, bilateral legs: Secondary | ICD-10-CM | POA: Diagnosis not present

## 2017-12-25 DIAGNOSIS — I350 Nonrheumatic aortic (valve) stenosis: Secondary | ICD-10-CM | POA: Diagnosis not present

## 2017-12-25 DIAGNOSIS — N183 Chronic kidney disease, stage 3 (moderate): Secondary | ICD-10-CM | POA: Diagnosis not present

## 2017-12-25 DIAGNOSIS — I25810 Atherosclerosis of coronary artery bypass graft(s) without angina pectoris: Secondary | ICD-10-CM | POA: Diagnosis not present

## 2017-12-28 DIAGNOSIS — I70263 Atherosclerosis of native arteries of extremities with gangrene, bilateral legs: Secondary | ICD-10-CM | POA: Diagnosis not present

## 2017-12-28 DIAGNOSIS — I350 Nonrheumatic aortic (valve) stenosis: Secondary | ICD-10-CM | POA: Diagnosis not present

## 2017-12-28 DIAGNOSIS — I25810 Atherosclerosis of coronary artery bypass graft(s) without angina pectoris: Secondary | ICD-10-CM | POA: Diagnosis not present

## 2017-12-28 DIAGNOSIS — N183 Chronic kidney disease, stage 3 (moderate): Secondary | ICD-10-CM | POA: Diagnosis not present

## 2017-12-28 DIAGNOSIS — G20 Parkinson's disease: Secondary | ICD-10-CM | POA: Diagnosis not present

## 2017-12-28 DIAGNOSIS — I509 Heart failure, unspecified: Secondary | ICD-10-CM | POA: Diagnosis not present

## 2017-12-29 DIAGNOSIS — I350 Nonrheumatic aortic (valve) stenosis: Secondary | ICD-10-CM | POA: Diagnosis not present

## 2017-12-29 DIAGNOSIS — I509 Heart failure, unspecified: Secondary | ICD-10-CM | POA: Diagnosis not present

## 2017-12-29 DIAGNOSIS — G20 Parkinson's disease: Secondary | ICD-10-CM | POA: Diagnosis not present

## 2017-12-29 DIAGNOSIS — I70263 Atherosclerosis of native arteries of extremities with gangrene, bilateral legs: Secondary | ICD-10-CM | POA: Diagnosis not present

## 2017-12-29 DIAGNOSIS — I25810 Atherosclerosis of coronary artery bypass graft(s) without angina pectoris: Secondary | ICD-10-CM | POA: Diagnosis not present

## 2017-12-29 DIAGNOSIS — N183 Chronic kidney disease, stage 3 (moderate): Secondary | ICD-10-CM | POA: Diagnosis not present

## 2017-12-30 DIAGNOSIS — I70263 Atherosclerosis of native arteries of extremities with gangrene, bilateral legs: Secondary | ICD-10-CM | POA: Diagnosis not present

## 2017-12-30 DIAGNOSIS — N183 Chronic kidney disease, stage 3 (moderate): Secondary | ICD-10-CM | POA: Diagnosis not present

## 2017-12-30 DIAGNOSIS — I350 Nonrheumatic aortic (valve) stenosis: Secondary | ICD-10-CM | POA: Diagnosis not present

## 2017-12-30 DIAGNOSIS — G20 Parkinson's disease: Secondary | ICD-10-CM | POA: Diagnosis not present

## 2017-12-30 DIAGNOSIS — I509 Heart failure, unspecified: Secondary | ICD-10-CM | POA: Diagnosis not present

## 2017-12-30 DIAGNOSIS — I25810 Atherosclerosis of coronary artery bypass graft(s) without angina pectoris: Secondary | ICD-10-CM | POA: Diagnosis not present

## 2017-12-31 DIAGNOSIS — I509 Heart failure, unspecified: Secondary | ICD-10-CM | POA: Diagnosis not present

## 2017-12-31 DIAGNOSIS — I350 Nonrheumatic aortic (valve) stenosis: Secondary | ICD-10-CM | POA: Diagnosis not present

## 2017-12-31 DIAGNOSIS — N183 Chronic kidney disease, stage 3 (moderate): Secondary | ICD-10-CM | POA: Diagnosis not present

## 2017-12-31 DIAGNOSIS — G20 Parkinson's disease: Secondary | ICD-10-CM | POA: Diagnosis not present

## 2017-12-31 DIAGNOSIS — I25810 Atherosclerosis of coronary artery bypass graft(s) without angina pectoris: Secondary | ICD-10-CM | POA: Diagnosis not present

## 2017-12-31 DIAGNOSIS — I70263 Atherosclerosis of native arteries of extremities with gangrene, bilateral legs: Secondary | ICD-10-CM | POA: Diagnosis not present

## 2018-01-05 DIAGNOSIS — I509 Heart failure, unspecified: Secondary | ICD-10-CM | POA: Diagnosis not present

## 2018-01-05 DIAGNOSIS — I25810 Atherosclerosis of coronary artery bypass graft(s) without angina pectoris: Secondary | ICD-10-CM | POA: Diagnosis not present

## 2018-01-05 DIAGNOSIS — I350 Nonrheumatic aortic (valve) stenosis: Secondary | ICD-10-CM | POA: Diagnosis not present

## 2018-01-05 DIAGNOSIS — G20 Parkinson's disease: Secondary | ICD-10-CM | POA: Diagnosis not present

## 2018-01-05 DIAGNOSIS — I70263 Atherosclerosis of native arteries of extremities with gangrene, bilateral legs: Secondary | ICD-10-CM | POA: Diagnosis not present

## 2018-01-05 DIAGNOSIS — N183 Chronic kidney disease, stage 3 (moderate): Secondary | ICD-10-CM | POA: Diagnosis not present

## 2018-01-06 DIAGNOSIS — N183 Chronic kidney disease, stage 3 (moderate): Secondary | ICD-10-CM | POA: Diagnosis not present

## 2018-01-06 DIAGNOSIS — I509 Heart failure, unspecified: Secondary | ICD-10-CM | POA: Diagnosis not present

## 2018-01-06 DIAGNOSIS — G20 Parkinson's disease: Secondary | ICD-10-CM | POA: Diagnosis not present

## 2018-01-06 DIAGNOSIS — I350 Nonrheumatic aortic (valve) stenosis: Secondary | ICD-10-CM | POA: Diagnosis not present

## 2018-01-06 DIAGNOSIS — I25810 Atherosclerosis of coronary artery bypass graft(s) without angina pectoris: Secondary | ICD-10-CM | POA: Diagnosis not present

## 2018-01-06 DIAGNOSIS — I70263 Atherosclerosis of native arteries of extremities with gangrene, bilateral legs: Secondary | ICD-10-CM | POA: Diagnosis not present

## 2018-01-07 DIAGNOSIS — N183 Chronic kidney disease, stage 3 (moderate): Secondary | ICD-10-CM | POA: Diagnosis not present

## 2018-01-07 DIAGNOSIS — I350 Nonrheumatic aortic (valve) stenosis: Secondary | ICD-10-CM | POA: Diagnosis not present

## 2018-01-07 DIAGNOSIS — I509 Heart failure, unspecified: Secondary | ICD-10-CM | POA: Diagnosis not present

## 2018-01-07 DIAGNOSIS — I70263 Atherosclerosis of native arteries of extremities with gangrene, bilateral legs: Secondary | ICD-10-CM | POA: Diagnosis not present

## 2018-01-07 DIAGNOSIS — G20 Parkinson's disease: Secondary | ICD-10-CM | POA: Diagnosis not present

## 2018-01-07 DIAGNOSIS — I25810 Atherosclerosis of coronary artery bypass graft(s) without angina pectoris: Secondary | ICD-10-CM | POA: Diagnosis not present

## 2018-01-08 DIAGNOSIS — I350 Nonrheumatic aortic (valve) stenosis: Secondary | ICD-10-CM | POA: Diagnosis not present

## 2018-01-08 DIAGNOSIS — I25810 Atherosclerosis of coronary artery bypass graft(s) without angina pectoris: Secondary | ICD-10-CM | POA: Diagnosis not present

## 2018-01-08 DIAGNOSIS — G3184 Mild cognitive impairment, so stated: Secondary | ICD-10-CM | POA: Diagnosis not present

## 2018-01-08 DIAGNOSIS — M353 Polymyalgia rheumatica: Secondary | ICD-10-CM | POA: Diagnosis not present

## 2018-01-08 DIAGNOSIS — N183 Chronic kidney disease, stage 3 (moderate): Secondary | ICD-10-CM | POA: Diagnosis not present

## 2018-01-08 DIAGNOSIS — G20 Parkinson's disease: Secondary | ICD-10-CM | POA: Diagnosis not present

## 2018-01-08 DIAGNOSIS — I70263 Atherosclerosis of native arteries of extremities with gangrene, bilateral legs: Secondary | ICD-10-CM | POA: Diagnosis not present

## 2018-01-08 DIAGNOSIS — E039 Hypothyroidism, unspecified: Secondary | ICD-10-CM | POA: Diagnosis not present

## 2018-01-08 DIAGNOSIS — G9009 Other idiopathic peripheral autonomic neuropathy: Secondary | ICD-10-CM | POA: Diagnosis not present

## 2018-01-08 DIAGNOSIS — I10 Essential (primary) hypertension: Secondary | ICD-10-CM | POA: Diagnosis not present

## 2018-01-08 DIAGNOSIS — I509 Heart failure, unspecified: Secondary | ICD-10-CM | POA: Diagnosis not present

## 2018-01-12 DIAGNOSIS — I25810 Atherosclerosis of coronary artery bypass graft(s) without angina pectoris: Secondary | ICD-10-CM | POA: Diagnosis not present

## 2018-01-12 DIAGNOSIS — N183 Chronic kidney disease, stage 3 (moderate): Secondary | ICD-10-CM | POA: Diagnosis not present

## 2018-01-12 DIAGNOSIS — G20 Parkinson's disease: Secondary | ICD-10-CM | POA: Diagnosis not present

## 2018-01-12 DIAGNOSIS — I350 Nonrheumatic aortic (valve) stenosis: Secondary | ICD-10-CM | POA: Diagnosis not present

## 2018-01-12 DIAGNOSIS — I509 Heart failure, unspecified: Secondary | ICD-10-CM | POA: Diagnosis not present

## 2018-01-12 DIAGNOSIS — I70263 Atherosclerosis of native arteries of extremities with gangrene, bilateral legs: Secondary | ICD-10-CM | POA: Diagnosis not present

## 2018-01-14 DIAGNOSIS — I350 Nonrheumatic aortic (valve) stenosis: Secondary | ICD-10-CM | POA: Diagnosis not present

## 2018-01-14 DIAGNOSIS — N183 Chronic kidney disease, stage 3 (moderate): Secondary | ICD-10-CM | POA: Diagnosis not present

## 2018-01-14 DIAGNOSIS — I509 Heart failure, unspecified: Secondary | ICD-10-CM | POA: Diagnosis not present

## 2018-01-14 DIAGNOSIS — I70263 Atherosclerosis of native arteries of extremities with gangrene, bilateral legs: Secondary | ICD-10-CM | POA: Diagnosis not present

## 2018-01-14 DIAGNOSIS — G20 Parkinson's disease: Secondary | ICD-10-CM | POA: Diagnosis not present

## 2018-01-14 DIAGNOSIS — I25810 Atherosclerosis of coronary artery bypass graft(s) without angina pectoris: Secondary | ICD-10-CM | POA: Diagnosis not present

## 2018-01-15 DIAGNOSIS — N183 Chronic kidney disease, stage 3 (moderate): Secondary | ICD-10-CM | POA: Diagnosis not present

## 2018-01-15 DIAGNOSIS — I509 Heart failure, unspecified: Secondary | ICD-10-CM | POA: Diagnosis not present

## 2018-01-15 DIAGNOSIS — G20 Parkinson's disease: Secondary | ICD-10-CM | POA: Diagnosis not present

## 2018-01-15 DIAGNOSIS — I70263 Atherosclerosis of native arteries of extremities with gangrene, bilateral legs: Secondary | ICD-10-CM | POA: Diagnosis not present

## 2018-01-15 DIAGNOSIS — I350 Nonrheumatic aortic (valve) stenosis: Secondary | ICD-10-CM | POA: Diagnosis not present

## 2018-01-15 DIAGNOSIS — I25810 Atherosclerosis of coronary artery bypass graft(s) without angina pectoris: Secondary | ICD-10-CM | POA: Diagnosis not present

## 2018-01-19 DIAGNOSIS — I70263 Atherosclerosis of native arteries of extremities with gangrene, bilateral legs: Secondary | ICD-10-CM | POA: Diagnosis not present

## 2018-01-19 DIAGNOSIS — I509 Heart failure, unspecified: Secondary | ICD-10-CM | POA: Diagnosis not present

## 2018-01-19 DIAGNOSIS — I350 Nonrheumatic aortic (valve) stenosis: Secondary | ICD-10-CM | POA: Diagnosis not present

## 2018-01-19 DIAGNOSIS — I25810 Atherosclerosis of coronary artery bypass graft(s) without angina pectoris: Secondary | ICD-10-CM | POA: Diagnosis not present

## 2018-01-19 DIAGNOSIS — N183 Chronic kidney disease, stage 3 (moderate): Secondary | ICD-10-CM | POA: Diagnosis not present

## 2018-01-19 DIAGNOSIS — G20 Parkinson's disease: Secondary | ICD-10-CM | POA: Diagnosis not present

## 2018-01-20 DIAGNOSIS — G20 Parkinson's disease: Secondary | ICD-10-CM | POA: Diagnosis not present

## 2018-01-20 DIAGNOSIS — I509 Heart failure, unspecified: Secondary | ICD-10-CM | POA: Diagnosis not present

## 2018-01-20 DIAGNOSIS — I350 Nonrheumatic aortic (valve) stenosis: Secondary | ICD-10-CM | POA: Diagnosis not present

## 2018-01-20 DIAGNOSIS — I70263 Atherosclerosis of native arteries of extremities with gangrene, bilateral legs: Secondary | ICD-10-CM | POA: Diagnosis not present

## 2018-01-20 DIAGNOSIS — I25810 Atherosclerosis of coronary artery bypass graft(s) without angina pectoris: Secondary | ICD-10-CM | POA: Diagnosis not present

## 2018-01-20 DIAGNOSIS — N183 Chronic kidney disease, stage 3 (moderate): Secondary | ICD-10-CM | POA: Diagnosis not present

## 2018-01-21 DIAGNOSIS — N183 Chronic kidney disease, stage 3 (moderate): Secondary | ICD-10-CM | POA: Diagnosis not present

## 2018-01-21 DIAGNOSIS — I509 Heart failure, unspecified: Secondary | ICD-10-CM | POA: Diagnosis not present

## 2018-01-21 DIAGNOSIS — G20 Parkinson's disease: Secondary | ICD-10-CM | POA: Diagnosis not present

## 2018-01-21 DIAGNOSIS — I350 Nonrheumatic aortic (valve) stenosis: Secondary | ICD-10-CM | POA: Diagnosis not present

## 2018-01-21 DIAGNOSIS — I25810 Atherosclerosis of coronary artery bypass graft(s) without angina pectoris: Secondary | ICD-10-CM | POA: Diagnosis not present

## 2018-01-21 DIAGNOSIS — I70263 Atherosclerosis of native arteries of extremities with gangrene, bilateral legs: Secondary | ICD-10-CM | POA: Diagnosis not present

## 2018-01-25 DIAGNOSIS — I25810 Atherosclerosis of coronary artery bypass graft(s) without angina pectoris: Secondary | ICD-10-CM | POA: Diagnosis not present

## 2018-01-25 DIAGNOSIS — N183 Chronic kidney disease, stage 3 (moderate): Secondary | ICD-10-CM | POA: Diagnosis not present

## 2018-01-25 DIAGNOSIS — I509 Heart failure, unspecified: Secondary | ICD-10-CM | POA: Diagnosis not present

## 2018-01-25 DIAGNOSIS — I350 Nonrheumatic aortic (valve) stenosis: Secondary | ICD-10-CM | POA: Diagnosis not present

## 2018-01-25 DIAGNOSIS — I70263 Atherosclerosis of native arteries of extremities with gangrene, bilateral legs: Secondary | ICD-10-CM | POA: Diagnosis not present

## 2018-01-25 DIAGNOSIS — G20 Parkinson's disease: Secondary | ICD-10-CM | POA: Diagnosis not present

## 2018-01-26 DIAGNOSIS — G20 Parkinson's disease: Secondary | ICD-10-CM | POA: Diagnosis not present

## 2018-01-26 DIAGNOSIS — I25810 Atherosclerosis of coronary artery bypass graft(s) without angina pectoris: Secondary | ICD-10-CM | POA: Diagnosis not present

## 2018-01-26 DIAGNOSIS — I509 Heart failure, unspecified: Secondary | ICD-10-CM | POA: Diagnosis not present

## 2018-01-26 DIAGNOSIS — N183 Chronic kidney disease, stage 3 (moderate): Secondary | ICD-10-CM | POA: Diagnosis not present

## 2018-01-26 DIAGNOSIS — I350 Nonrheumatic aortic (valve) stenosis: Secondary | ICD-10-CM | POA: Diagnosis not present

## 2018-01-26 DIAGNOSIS — I70263 Atherosclerosis of native arteries of extremities with gangrene, bilateral legs: Secondary | ICD-10-CM | POA: Diagnosis not present

## 2018-01-28 DIAGNOSIS — I350 Nonrheumatic aortic (valve) stenosis: Secondary | ICD-10-CM | POA: Diagnosis not present

## 2018-01-28 DIAGNOSIS — I70263 Atherosclerosis of native arteries of extremities with gangrene, bilateral legs: Secondary | ICD-10-CM | POA: Diagnosis not present

## 2018-01-28 DIAGNOSIS — I25810 Atherosclerosis of coronary artery bypass graft(s) without angina pectoris: Secondary | ICD-10-CM | POA: Diagnosis not present

## 2018-01-28 DIAGNOSIS — I509 Heart failure, unspecified: Secondary | ICD-10-CM | POA: Diagnosis not present

## 2018-01-28 DIAGNOSIS — G20 Parkinson's disease: Secondary | ICD-10-CM | POA: Diagnosis not present

## 2018-01-28 DIAGNOSIS — N183 Chronic kidney disease, stage 3 (moderate): Secondary | ICD-10-CM | POA: Diagnosis not present

## 2018-01-29 DIAGNOSIS — I350 Nonrheumatic aortic (valve) stenosis: Secondary | ICD-10-CM | POA: Diagnosis not present

## 2018-01-29 DIAGNOSIS — N183 Chronic kidney disease, stage 3 (moderate): Secondary | ICD-10-CM | POA: Diagnosis not present

## 2018-01-29 DIAGNOSIS — I25810 Atherosclerosis of coronary artery bypass graft(s) without angina pectoris: Secondary | ICD-10-CM | POA: Diagnosis not present

## 2018-01-29 DIAGNOSIS — G20 Parkinson's disease: Secondary | ICD-10-CM | POA: Diagnosis not present

## 2018-01-29 DIAGNOSIS — I509 Heart failure, unspecified: Secondary | ICD-10-CM | POA: Diagnosis not present

## 2018-01-29 DIAGNOSIS — I70263 Atherosclerosis of native arteries of extremities with gangrene, bilateral legs: Secondary | ICD-10-CM | POA: Diagnosis not present

## 2018-02-02 DIAGNOSIS — N183 Chronic kidney disease, stage 3 (moderate): Secondary | ICD-10-CM | POA: Diagnosis not present

## 2018-02-02 DIAGNOSIS — I509 Heart failure, unspecified: Secondary | ICD-10-CM | POA: Diagnosis not present

## 2018-02-02 DIAGNOSIS — I350 Nonrheumatic aortic (valve) stenosis: Secondary | ICD-10-CM | POA: Diagnosis not present

## 2018-02-02 DIAGNOSIS — G20 Parkinson's disease: Secondary | ICD-10-CM | POA: Diagnosis not present

## 2018-02-02 DIAGNOSIS — I25810 Atherosclerosis of coronary artery bypass graft(s) without angina pectoris: Secondary | ICD-10-CM | POA: Diagnosis not present

## 2018-02-02 DIAGNOSIS — I70263 Atherosclerosis of native arteries of extremities with gangrene, bilateral legs: Secondary | ICD-10-CM | POA: Diagnosis not present

## 2018-02-03 DIAGNOSIS — I70263 Atherosclerosis of native arteries of extremities with gangrene, bilateral legs: Secondary | ICD-10-CM | POA: Diagnosis not present

## 2018-02-03 DIAGNOSIS — I509 Heart failure, unspecified: Secondary | ICD-10-CM | POA: Diagnosis not present

## 2018-02-03 DIAGNOSIS — N183 Chronic kidney disease, stage 3 (moderate): Secondary | ICD-10-CM | POA: Diagnosis not present

## 2018-02-03 DIAGNOSIS — I350 Nonrheumatic aortic (valve) stenosis: Secondary | ICD-10-CM | POA: Diagnosis not present

## 2018-02-03 DIAGNOSIS — G20 Parkinson's disease: Secondary | ICD-10-CM | POA: Diagnosis not present

## 2018-02-03 DIAGNOSIS — I25810 Atherosclerosis of coronary artery bypass graft(s) without angina pectoris: Secondary | ICD-10-CM | POA: Diagnosis not present

## 2018-02-04 DIAGNOSIS — N183 Chronic kidney disease, stage 3 (moderate): Secondary | ICD-10-CM | POA: Diagnosis not present

## 2018-02-04 DIAGNOSIS — I350 Nonrheumatic aortic (valve) stenosis: Secondary | ICD-10-CM | POA: Diagnosis not present

## 2018-02-04 DIAGNOSIS — G20 Parkinson's disease: Secondary | ICD-10-CM | POA: Diagnosis not present

## 2018-02-04 DIAGNOSIS — I25810 Atherosclerosis of coronary artery bypass graft(s) without angina pectoris: Secondary | ICD-10-CM | POA: Diagnosis not present

## 2018-02-04 DIAGNOSIS — I70263 Atherosclerosis of native arteries of extremities with gangrene, bilateral legs: Secondary | ICD-10-CM | POA: Diagnosis not present

## 2018-02-04 DIAGNOSIS — I509 Heart failure, unspecified: Secondary | ICD-10-CM | POA: Diagnosis not present

## 2018-02-08 DIAGNOSIS — I350 Nonrheumatic aortic (valve) stenosis: Secondary | ICD-10-CM | POA: Diagnosis not present

## 2018-02-08 DIAGNOSIS — N183 Chronic kidney disease, stage 3 (moderate): Secondary | ICD-10-CM | POA: Diagnosis not present

## 2018-02-08 DIAGNOSIS — I10 Essential (primary) hypertension: Secondary | ICD-10-CM | POA: Diagnosis not present

## 2018-02-08 DIAGNOSIS — I25810 Atherosclerosis of coronary artery bypass graft(s) without angina pectoris: Secondary | ICD-10-CM | POA: Diagnosis not present

## 2018-02-08 DIAGNOSIS — G3184 Mild cognitive impairment, so stated: Secondary | ICD-10-CM | POA: Diagnosis not present

## 2018-02-08 DIAGNOSIS — I509 Heart failure, unspecified: Secondary | ICD-10-CM | POA: Diagnosis not present

## 2018-02-08 DIAGNOSIS — G20 Parkinson's disease: Secondary | ICD-10-CM | POA: Diagnosis not present

## 2018-02-08 DIAGNOSIS — G9009 Other idiopathic peripheral autonomic neuropathy: Secondary | ICD-10-CM | POA: Diagnosis not present

## 2018-02-08 DIAGNOSIS — M353 Polymyalgia rheumatica: Secondary | ICD-10-CM | POA: Diagnosis not present

## 2018-02-08 DIAGNOSIS — I70263 Atherosclerosis of native arteries of extremities with gangrene, bilateral legs: Secondary | ICD-10-CM | POA: Diagnosis not present

## 2018-02-08 DIAGNOSIS — E039 Hypothyroidism, unspecified: Secondary | ICD-10-CM | POA: Diagnosis not present

## 2018-02-09 DIAGNOSIS — I25810 Atherosclerosis of coronary artery bypass graft(s) without angina pectoris: Secondary | ICD-10-CM | POA: Diagnosis not present

## 2018-02-09 DIAGNOSIS — N183 Chronic kidney disease, stage 3 (moderate): Secondary | ICD-10-CM | POA: Diagnosis not present

## 2018-02-09 DIAGNOSIS — I350 Nonrheumatic aortic (valve) stenosis: Secondary | ICD-10-CM | POA: Diagnosis not present

## 2018-02-09 DIAGNOSIS — I70263 Atherosclerosis of native arteries of extremities with gangrene, bilateral legs: Secondary | ICD-10-CM | POA: Diagnosis not present

## 2018-02-09 DIAGNOSIS — G20 Parkinson's disease: Secondary | ICD-10-CM | POA: Diagnosis not present

## 2018-02-09 DIAGNOSIS — I509 Heart failure, unspecified: Secondary | ICD-10-CM | POA: Diagnosis not present

## 2018-02-11 DIAGNOSIS — N183 Chronic kidney disease, stage 3 (moderate): Secondary | ICD-10-CM | POA: Diagnosis not present

## 2018-02-11 DIAGNOSIS — G20 Parkinson's disease: Secondary | ICD-10-CM | POA: Diagnosis not present

## 2018-02-11 DIAGNOSIS — I350 Nonrheumatic aortic (valve) stenosis: Secondary | ICD-10-CM | POA: Diagnosis not present

## 2018-02-11 DIAGNOSIS — I25810 Atherosclerosis of coronary artery bypass graft(s) without angina pectoris: Secondary | ICD-10-CM | POA: Diagnosis not present

## 2018-02-11 DIAGNOSIS — I509 Heart failure, unspecified: Secondary | ICD-10-CM | POA: Diagnosis not present

## 2018-02-11 DIAGNOSIS — I70263 Atherosclerosis of native arteries of extremities with gangrene, bilateral legs: Secondary | ICD-10-CM | POA: Diagnosis not present

## 2018-02-12 DIAGNOSIS — I509 Heart failure, unspecified: Secondary | ICD-10-CM | POA: Diagnosis not present

## 2018-02-12 DIAGNOSIS — G20 Parkinson's disease: Secondary | ICD-10-CM | POA: Diagnosis not present

## 2018-02-12 DIAGNOSIS — I70263 Atherosclerosis of native arteries of extremities with gangrene, bilateral legs: Secondary | ICD-10-CM | POA: Diagnosis not present

## 2018-02-12 DIAGNOSIS — N183 Chronic kidney disease, stage 3 (moderate): Secondary | ICD-10-CM | POA: Diagnosis not present

## 2018-02-12 DIAGNOSIS — I350 Nonrheumatic aortic (valve) stenosis: Secondary | ICD-10-CM | POA: Diagnosis not present

## 2018-02-12 DIAGNOSIS — I25810 Atherosclerosis of coronary artery bypass graft(s) without angina pectoris: Secondary | ICD-10-CM | POA: Diagnosis not present

## 2018-02-15 DIAGNOSIS — G20 Parkinson's disease: Secondary | ICD-10-CM | POA: Diagnosis not present

## 2018-02-15 DIAGNOSIS — I25810 Atherosclerosis of coronary artery bypass graft(s) without angina pectoris: Secondary | ICD-10-CM | POA: Diagnosis not present

## 2018-02-15 DIAGNOSIS — I509 Heart failure, unspecified: Secondary | ICD-10-CM | POA: Diagnosis not present

## 2018-02-15 DIAGNOSIS — I70263 Atherosclerosis of native arteries of extremities with gangrene, bilateral legs: Secondary | ICD-10-CM | POA: Diagnosis not present

## 2018-02-15 DIAGNOSIS — N183 Chronic kidney disease, stage 3 (moderate): Secondary | ICD-10-CM | POA: Diagnosis not present

## 2018-02-15 DIAGNOSIS — I350 Nonrheumatic aortic (valve) stenosis: Secondary | ICD-10-CM | POA: Diagnosis not present

## 2018-02-16 DIAGNOSIS — I350 Nonrheumatic aortic (valve) stenosis: Secondary | ICD-10-CM | POA: Diagnosis not present

## 2018-02-16 DIAGNOSIS — I25810 Atherosclerosis of coronary artery bypass graft(s) without angina pectoris: Secondary | ICD-10-CM | POA: Diagnosis not present

## 2018-02-16 DIAGNOSIS — I509 Heart failure, unspecified: Secondary | ICD-10-CM | POA: Diagnosis not present

## 2018-02-16 DIAGNOSIS — I70263 Atherosclerosis of native arteries of extremities with gangrene, bilateral legs: Secondary | ICD-10-CM | POA: Diagnosis not present

## 2018-02-16 DIAGNOSIS — G20 Parkinson's disease: Secondary | ICD-10-CM | POA: Diagnosis not present

## 2018-02-16 DIAGNOSIS — N183 Chronic kidney disease, stage 3 (moderate): Secondary | ICD-10-CM | POA: Diagnosis not present

## 2018-02-17 DIAGNOSIS — I70263 Atherosclerosis of native arteries of extremities with gangrene, bilateral legs: Secondary | ICD-10-CM | POA: Diagnosis not present

## 2018-02-17 DIAGNOSIS — I509 Heart failure, unspecified: Secondary | ICD-10-CM | POA: Diagnosis not present

## 2018-02-17 DIAGNOSIS — G20 Parkinson's disease: Secondary | ICD-10-CM | POA: Diagnosis not present

## 2018-02-17 DIAGNOSIS — N183 Chronic kidney disease, stage 3 (moderate): Secondary | ICD-10-CM | POA: Diagnosis not present

## 2018-02-17 DIAGNOSIS — I25810 Atherosclerosis of coronary artery bypass graft(s) without angina pectoris: Secondary | ICD-10-CM | POA: Diagnosis not present

## 2018-02-17 DIAGNOSIS — I350 Nonrheumatic aortic (valve) stenosis: Secondary | ICD-10-CM | POA: Diagnosis not present

## 2018-02-19 DIAGNOSIS — I509 Heart failure, unspecified: Secondary | ICD-10-CM | POA: Diagnosis not present

## 2018-02-19 DIAGNOSIS — I350 Nonrheumatic aortic (valve) stenosis: Secondary | ICD-10-CM | POA: Diagnosis not present

## 2018-02-19 DIAGNOSIS — N183 Chronic kidney disease, stage 3 (moderate): Secondary | ICD-10-CM | POA: Diagnosis not present

## 2018-02-19 DIAGNOSIS — I70263 Atherosclerosis of native arteries of extremities with gangrene, bilateral legs: Secondary | ICD-10-CM | POA: Diagnosis not present

## 2018-02-19 DIAGNOSIS — G20 Parkinson's disease: Secondary | ICD-10-CM | POA: Diagnosis not present

## 2018-02-19 DIAGNOSIS — I25810 Atherosclerosis of coronary artery bypass graft(s) without angina pectoris: Secondary | ICD-10-CM | POA: Diagnosis not present

## 2018-02-23 DIAGNOSIS — G20 Parkinson's disease: Secondary | ICD-10-CM | POA: Diagnosis not present

## 2018-02-23 DIAGNOSIS — I509 Heart failure, unspecified: Secondary | ICD-10-CM | POA: Diagnosis not present

## 2018-02-23 DIAGNOSIS — I25810 Atherosclerosis of coronary artery bypass graft(s) without angina pectoris: Secondary | ICD-10-CM | POA: Diagnosis not present

## 2018-02-23 DIAGNOSIS — N183 Chronic kidney disease, stage 3 (moderate): Secondary | ICD-10-CM | POA: Diagnosis not present

## 2018-02-23 DIAGNOSIS — I350 Nonrheumatic aortic (valve) stenosis: Secondary | ICD-10-CM | POA: Diagnosis not present

## 2018-02-23 DIAGNOSIS — I70263 Atherosclerosis of native arteries of extremities with gangrene, bilateral legs: Secondary | ICD-10-CM | POA: Diagnosis not present

## 2018-02-25 DIAGNOSIS — N183 Chronic kidney disease, stage 3 (moderate): Secondary | ICD-10-CM | POA: Diagnosis not present

## 2018-02-25 DIAGNOSIS — I25810 Atherosclerosis of coronary artery bypass graft(s) without angina pectoris: Secondary | ICD-10-CM | POA: Diagnosis not present

## 2018-02-25 DIAGNOSIS — G20 Parkinson's disease: Secondary | ICD-10-CM | POA: Diagnosis not present

## 2018-02-25 DIAGNOSIS — I350 Nonrheumatic aortic (valve) stenosis: Secondary | ICD-10-CM | POA: Diagnosis not present

## 2018-02-25 DIAGNOSIS — I509 Heart failure, unspecified: Secondary | ICD-10-CM | POA: Diagnosis not present

## 2018-02-25 DIAGNOSIS — I70263 Atherosclerosis of native arteries of extremities with gangrene, bilateral legs: Secondary | ICD-10-CM | POA: Diagnosis not present

## 2018-03-02 DIAGNOSIS — I70263 Atherosclerosis of native arteries of extremities with gangrene, bilateral legs: Secondary | ICD-10-CM | POA: Diagnosis not present

## 2018-03-02 DIAGNOSIS — I509 Heart failure, unspecified: Secondary | ICD-10-CM | POA: Diagnosis not present

## 2018-03-02 DIAGNOSIS — G20 Parkinson's disease: Secondary | ICD-10-CM | POA: Diagnosis not present

## 2018-03-02 DIAGNOSIS — I350 Nonrheumatic aortic (valve) stenosis: Secondary | ICD-10-CM | POA: Diagnosis not present

## 2018-03-02 DIAGNOSIS — N183 Chronic kidney disease, stage 3 (moderate): Secondary | ICD-10-CM | POA: Diagnosis not present

## 2018-03-02 DIAGNOSIS — I25810 Atherosclerosis of coronary artery bypass graft(s) without angina pectoris: Secondary | ICD-10-CM | POA: Diagnosis not present

## 2018-03-05 DIAGNOSIS — I350 Nonrheumatic aortic (valve) stenosis: Secondary | ICD-10-CM | POA: Diagnosis not present

## 2018-03-05 DIAGNOSIS — N183 Chronic kidney disease, stage 3 (moderate): Secondary | ICD-10-CM | POA: Diagnosis not present

## 2018-03-05 DIAGNOSIS — I70263 Atherosclerosis of native arteries of extremities with gangrene, bilateral legs: Secondary | ICD-10-CM | POA: Diagnosis not present

## 2018-03-05 DIAGNOSIS — I509 Heart failure, unspecified: Secondary | ICD-10-CM | POA: Diagnosis not present

## 2018-03-05 DIAGNOSIS — G20 Parkinson's disease: Secondary | ICD-10-CM | POA: Diagnosis not present

## 2018-03-05 DIAGNOSIS — I25810 Atherosclerosis of coronary artery bypass graft(s) without angina pectoris: Secondary | ICD-10-CM | POA: Diagnosis not present

## 2018-03-06 DIAGNOSIS — G20 Parkinson's disease: Secondary | ICD-10-CM | POA: Diagnosis not present

## 2018-03-06 DIAGNOSIS — I509 Heart failure, unspecified: Secondary | ICD-10-CM | POA: Diagnosis not present

## 2018-03-06 DIAGNOSIS — I350 Nonrheumatic aortic (valve) stenosis: Secondary | ICD-10-CM | POA: Diagnosis not present

## 2018-03-06 DIAGNOSIS — I25810 Atherosclerosis of coronary artery bypass graft(s) without angina pectoris: Secondary | ICD-10-CM | POA: Diagnosis not present

## 2018-03-06 DIAGNOSIS — N183 Chronic kidney disease, stage 3 (moderate): Secondary | ICD-10-CM | POA: Diagnosis not present

## 2018-03-06 DIAGNOSIS — I70263 Atherosclerosis of native arteries of extremities with gangrene, bilateral legs: Secondary | ICD-10-CM | POA: Diagnosis not present

## 2018-03-08 DIAGNOSIS — I25810 Atherosclerosis of coronary artery bypass graft(s) without angina pectoris: Secondary | ICD-10-CM | POA: Diagnosis not present

## 2018-03-08 DIAGNOSIS — I70263 Atherosclerosis of native arteries of extremities with gangrene, bilateral legs: Secondary | ICD-10-CM | POA: Diagnosis not present

## 2018-03-08 DIAGNOSIS — G20 Parkinson's disease: Secondary | ICD-10-CM | POA: Diagnosis not present

## 2018-03-08 DIAGNOSIS — N183 Chronic kidney disease, stage 3 (moderate): Secondary | ICD-10-CM | POA: Diagnosis not present

## 2018-03-08 DIAGNOSIS — I509 Heart failure, unspecified: Secondary | ICD-10-CM | POA: Diagnosis not present

## 2018-03-08 DIAGNOSIS — I350 Nonrheumatic aortic (valve) stenosis: Secondary | ICD-10-CM | POA: Diagnosis not present

## 2018-03-09 DIAGNOSIS — I70263 Atherosclerosis of native arteries of extremities with gangrene, bilateral legs: Secondary | ICD-10-CM | POA: Diagnosis not present

## 2018-03-09 DIAGNOSIS — I509 Heart failure, unspecified: Secondary | ICD-10-CM | POA: Diagnosis not present

## 2018-03-09 DIAGNOSIS — N183 Chronic kidney disease, stage 3 (moderate): Secondary | ICD-10-CM | POA: Diagnosis not present

## 2018-03-09 DIAGNOSIS — G20 Parkinson's disease: Secondary | ICD-10-CM | POA: Diagnosis not present

## 2018-03-09 DIAGNOSIS — I350 Nonrheumatic aortic (valve) stenosis: Secondary | ICD-10-CM | POA: Diagnosis not present

## 2018-03-09 DIAGNOSIS — I25810 Atherosclerosis of coronary artery bypass graft(s) without angina pectoris: Secondary | ICD-10-CM | POA: Diagnosis not present

## 2018-03-10 DIAGNOSIS — G3184 Mild cognitive impairment, so stated: Secondary | ICD-10-CM | POA: Diagnosis not present

## 2018-03-10 DIAGNOSIS — I10 Essential (primary) hypertension: Secondary | ICD-10-CM | POA: Diagnosis not present

## 2018-03-10 DIAGNOSIS — G9009 Other idiopathic peripheral autonomic neuropathy: Secondary | ICD-10-CM | POA: Diagnosis not present

## 2018-03-10 DIAGNOSIS — R54 Age-related physical debility: Secondary | ICD-10-CM | POA: Diagnosis not present

## 2018-03-10 DIAGNOSIS — N183 Chronic kidney disease, stage 3 (moderate): Secondary | ICD-10-CM | POA: Diagnosis not present

## 2018-03-10 DIAGNOSIS — I25810 Atherosclerosis of coronary artery bypass graft(s) without angina pectoris: Secondary | ICD-10-CM | POA: Diagnosis not present

## 2018-03-10 DIAGNOSIS — I70263 Atherosclerosis of native arteries of extremities with gangrene, bilateral legs: Secondary | ICD-10-CM | POA: Diagnosis not present

## 2018-03-10 DIAGNOSIS — E039 Hypothyroidism, unspecified: Secondary | ICD-10-CM | POA: Diagnosis not present

## 2018-03-10 DIAGNOSIS — M353 Polymyalgia rheumatica: Secondary | ICD-10-CM | POA: Diagnosis not present

## 2018-03-10 DIAGNOSIS — I350 Nonrheumatic aortic (valve) stenosis: Secondary | ICD-10-CM | POA: Diagnosis not present

## 2018-03-10 DIAGNOSIS — G20 Parkinson's disease: Secondary | ICD-10-CM | POA: Diagnosis not present

## 2018-03-10 DIAGNOSIS — I509 Heart failure, unspecified: Secondary | ICD-10-CM | POA: Diagnosis not present

## 2018-03-12 DIAGNOSIS — I25810 Atherosclerosis of coronary artery bypass graft(s) without angina pectoris: Secondary | ICD-10-CM | POA: Diagnosis not present

## 2018-03-12 DIAGNOSIS — I70263 Atherosclerosis of native arteries of extremities with gangrene, bilateral legs: Secondary | ICD-10-CM | POA: Diagnosis not present

## 2018-03-12 DIAGNOSIS — I350 Nonrheumatic aortic (valve) stenosis: Secondary | ICD-10-CM | POA: Diagnosis not present

## 2018-03-12 DIAGNOSIS — I509 Heart failure, unspecified: Secondary | ICD-10-CM | POA: Diagnosis not present

## 2018-03-12 DIAGNOSIS — N183 Chronic kidney disease, stage 3 (moderate): Secondary | ICD-10-CM | POA: Diagnosis not present

## 2018-03-12 DIAGNOSIS — G20 Parkinson's disease: Secondary | ICD-10-CM | POA: Diagnosis not present

## 2018-03-14 DIAGNOSIS — I25810 Atherosclerosis of coronary artery bypass graft(s) without angina pectoris: Secondary | ICD-10-CM | POA: Diagnosis not present

## 2018-03-14 DIAGNOSIS — G20 Parkinson's disease: Secondary | ICD-10-CM | POA: Diagnosis not present

## 2018-03-14 DIAGNOSIS — N183 Chronic kidney disease, stage 3 (moderate): Secondary | ICD-10-CM | POA: Diagnosis not present

## 2018-03-14 DIAGNOSIS — I70263 Atherosclerosis of native arteries of extremities with gangrene, bilateral legs: Secondary | ICD-10-CM | POA: Diagnosis not present

## 2018-03-14 DIAGNOSIS — I509 Heart failure, unspecified: Secondary | ICD-10-CM | POA: Diagnosis not present

## 2018-03-14 DIAGNOSIS — I350 Nonrheumatic aortic (valve) stenosis: Secondary | ICD-10-CM | POA: Diagnosis not present

## 2018-03-15 DIAGNOSIS — N183 Chronic kidney disease, stage 3 (moderate): Secondary | ICD-10-CM | POA: Diagnosis not present

## 2018-03-15 DIAGNOSIS — I25810 Atherosclerosis of coronary artery bypass graft(s) without angina pectoris: Secondary | ICD-10-CM | POA: Diagnosis not present

## 2018-03-15 DIAGNOSIS — I350 Nonrheumatic aortic (valve) stenosis: Secondary | ICD-10-CM | POA: Diagnosis not present

## 2018-03-15 DIAGNOSIS — G20 Parkinson's disease: Secondary | ICD-10-CM | POA: Diagnosis not present

## 2018-03-15 DIAGNOSIS — I509 Heart failure, unspecified: Secondary | ICD-10-CM | POA: Diagnosis not present

## 2018-03-15 DIAGNOSIS — I70263 Atherosclerosis of native arteries of extremities with gangrene, bilateral legs: Secondary | ICD-10-CM | POA: Diagnosis not present

## 2018-03-16 DIAGNOSIS — N183 Chronic kidney disease, stage 3 (moderate): Secondary | ICD-10-CM | POA: Diagnosis not present

## 2018-03-16 DIAGNOSIS — I25810 Atherosclerosis of coronary artery bypass graft(s) without angina pectoris: Secondary | ICD-10-CM | POA: Diagnosis not present

## 2018-03-16 DIAGNOSIS — I70263 Atherosclerosis of native arteries of extremities with gangrene, bilateral legs: Secondary | ICD-10-CM | POA: Diagnosis not present

## 2018-03-16 DIAGNOSIS — I509 Heart failure, unspecified: Secondary | ICD-10-CM | POA: Diagnosis not present

## 2018-03-16 DIAGNOSIS — I350 Nonrheumatic aortic (valve) stenosis: Secondary | ICD-10-CM | POA: Diagnosis not present

## 2018-03-16 DIAGNOSIS — G20 Parkinson's disease: Secondary | ICD-10-CM | POA: Diagnosis not present

## 2018-03-17 DIAGNOSIS — I25810 Atherosclerosis of coronary artery bypass graft(s) without angina pectoris: Secondary | ICD-10-CM | POA: Diagnosis not present

## 2018-03-17 DIAGNOSIS — I509 Heart failure, unspecified: Secondary | ICD-10-CM | POA: Diagnosis not present

## 2018-03-17 DIAGNOSIS — I350 Nonrheumatic aortic (valve) stenosis: Secondary | ICD-10-CM | POA: Diagnosis not present

## 2018-03-17 DIAGNOSIS — I70263 Atherosclerosis of native arteries of extremities with gangrene, bilateral legs: Secondary | ICD-10-CM | POA: Diagnosis not present

## 2018-03-17 DIAGNOSIS — N183 Chronic kidney disease, stage 3 (moderate): Secondary | ICD-10-CM | POA: Diagnosis not present

## 2018-03-17 DIAGNOSIS — G20 Parkinson's disease: Secondary | ICD-10-CM | POA: Diagnosis not present

## 2018-03-19 DIAGNOSIS — G20 Parkinson's disease: Secondary | ICD-10-CM | POA: Diagnosis not present

## 2018-03-19 DIAGNOSIS — N183 Chronic kidney disease, stage 3 (moderate): Secondary | ICD-10-CM | POA: Diagnosis not present

## 2018-03-19 DIAGNOSIS — I25810 Atherosclerosis of coronary artery bypass graft(s) without angina pectoris: Secondary | ICD-10-CM | POA: Diagnosis not present

## 2018-03-19 DIAGNOSIS — I350 Nonrheumatic aortic (valve) stenosis: Secondary | ICD-10-CM | POA: Diagnosis not present

## 2018-03-19 DIAGNOSIS — I509 Heart failure, unspecified: Secondary | ICD-10-CM | POA: Diagnosis not present

## 2018-03-19 DIAGNOSIS — I70263 Atherosclerosis of native arteries of extremities with gangrene, bilateral legs: Secondary | ICD-10-CM | POA: Diagnosis not present

## 2018-03-22 DIAGNOSIS — N183 Chronic kidney disease, stage 3 (moderate): Secondary | ICD-10-CM | POA: Diagnosis not present

## 2018-03-22 DIAGNOSIS — G20 Parkinson's disease: Secondary | ICD-10-CM | POA: Diagnosis not present

## 2018-03-22 DIAGNOSIS — I70263 Atherosclerosis of native arteries of extremities with gangrene, bilateral legs: Secondary | ICD-10-CM | POA: Diagnosis not present

## 2018-03-22 DIAGNOSIS — I350 Nonrheumatic aortic (valve) stenosis: Secondary | ICD-10-CM | POA: Diagnosis not present

## 2018-03-22 DIAGNOSIS — I509 Heart failure, unspecified: Secondary | ICD-10-CM | POA: Diagnosis not present

## 2018-03-22 DIAGNOSIS — I25810 Atherosclerosis of coronary artery bypass graft(s) without angina pectoris: Secondary | ICD-10-CM | POA: Diagnosis not present

## 2018-03-23 DIAGNOSIS — I350 Nonrheumatic aortic (valve) stenosis: Secondary | ICD-10-CM | POA: Diagnosis not present

## 2018-03-23 DIAGNOSIS — I509 Heart failure, unspecified: Secondary | ICD-10-CM | POA: Diagnosis not present

## 2018-03-23 DIAGNOSIS — N183 Chronic kidney disease, stage 3 (moderate): Secondary | ICD-10-CM | POA: Diagnosis not present

## 2018-03-23 DIAGNOSIS — I25810 Atherosclerosis of coronary artery bypass graft(s) without angina pectoris: Secondary | ICD-10-CM | POA: Diagnosis not present

## 2018-03-23 DIAGNOSIS — I70263 Atherosclerosis of native arteries of extremities with gangrene, bilateral legs: Secondary | ICD-10-CM | POA: Diagnosis not present

## 2018-03-23 DIAGNOSIS — G20 Parkinson's disease: Secondary | ICD-10-CM | POA: Diagnosis not present

## 2018-03-24 DIAGNOSIS — N183 Chronic kidney disease, stage 3 (moderate): Secondary | ICD-10-CM | POA: Diagnosis not present

## 2018-03-24 DIAGNOSIS — I25810 Atherosclerosis of coronary artery bypass graft(s) without angina pectoris: Secondary | ICD-10-CM | POA: Diagnosis not present

## 2018-03-24 DIAGNOSIS — I70263 Atherosclerosis of native arteries of extremities with gangrene, bilateral legs: Secondary | ICD-10-CM | POA: Diagnosis not present

## 2018-03-24 DIAGNOSIS — I350 Nonrheumatic aortic (valve) stenosis: Secondary | ICD-10-CM | POA: Diagnosis not present

## 2018-03-24 DIAGNOSIS — G20 Parkinson's disease: Secondary | ICD-10-CM | POA: Diagnosis not present

## 2018-03-24 DIAGNOSIS — I509 Heart failure, unspecified: Secondary | ICD-10-CM | POA: Diagnosis not present

## 2018-03-26 DIAGNOSIS — I25810 Atherosclerosis of coronary artery bypass graft(s) without angina pectoris: Secondary | ICD-10-CM | POA: Diagnosis not present

## 2018-03-26 DIAGNOSIS — I350 Nonrheumatic aortic (valve) stenosis: Secondary | ICD-10-CM | POA: Diagnosis not present

## 2018-03-26 DIAGNOSIS — G20 Parkinson's disease: Secondary | ICD-10-CM | POA: Diagnosis not present

## 2018-03-26 DIAGNOSIS — N183 Chronic kidney disease, stage 3 (moderate): Secondary | ICD-10-CM | POA: Diagnosis not present

## 2018-03-26 DIAGNOSIS — I509 Heart failure, unspecified: Secondary | ICD-10-CM | POA: Diagnosis not present

## 2018-03-26 DIAGNOSIS — I70263 Atherosclerosis of native arteries of extremities with gangrene, bilateral legs: Secondary | ICD-10-CM | POA: Diagnosis not present

## 2018-03-29 DIAGNOSIS — G20 Parkinson's disease: Secondary | ICD-10-CM | POA: Diagnosis not present

## 2018-03-29 DIAGNOSIS — N183 Chronic kidney disease, stage 3 (moderate): Secondary | ICD-10-CM | POA: Diagnosis not present

## 2018-03-29 DIAGNOSIS — I25810 Atherosclerosis of coronary artery bypass graft(s) without angina pectoris: Secondary | ICD-10-CM | POA: Diagnosis not present

## 2018-03-29 DIAGNOSIS — I350 Nonrheumatic aortic (valve) stenosis: Secondary | ICD-10-CM | POA: Diagnosis not present

## 2018-03-29 DIAGNOSIS — I70263 Atherosclerosis of native arteries of extremities with gangrene, bilateral legs: Secondary | ICD-10-CM | POA: Diagnosis not present

## 2018-03-29 DIAGNOSIS — I509 Heart failure, unspecified: Secondary | ICD-10-CM | POA: Diagnosis not present

## 2018-03-30 DIAGNOSIS — I70263 Atherosclerosis of native arteries of extremities with gangrene, bilateral legs: Secondary | ICD-10-CM | POA: Diagnosis not present

## 2018-03-30 DIAGNOSIS — G20 Parkinson's disease: Secondary | ICD-10-CM | POA: Diagnosis not present

## 2018-03-30 DIAGNOSIS — I509 Heart failure, unspecified: Secondary | ICD-10-CM | POA: Diagnosis not present

## 2018-03-30 DIAGNOSIS — I25810 Atherosclerosis of coronary artery bypass graft(s) without angina pectoris: Secondary | ICD-10-CM | POA: Diagnosis not present

## 2018-03-30 DIAGNOSIS — N183 Chronic kidney disease, stage 3 (moderate): Secondary | ICD-10-CM | POA: Diagnosis not present

## 2018-03-30 DIAGNOSIS — I350 Nonrheumatic aortic (valve) stenosis: Secondary | ICD-10-CM | POA: Diagnosis not present

## 2018-03-31 DIAGNOSIS — I509 Heart failure, unspecified: Secondary | ICD-10-CM | POA: Diagnosis not present

## 2018-03-31 DIAGNOSIS — I70263 Atherosclerosis of native arteries of extremities with gangrene, bilateral legs: Secondary | ICD-10-CM | POA: Diagnosis not present

## 2018-03-31 DIAGNOSIS — G20 Parkinson's disease: Secondary | ICD-10-CM | POA: Diagnosis not present

## 2018-03-31 DIAGNOSIS — I25810 Atherosclerosis of coronary artery bypass graft(s) without angina pectoris: Secondary | ICD-10-CM | POA: Diagnosis not present

## 2018-03-31 DIAGNOSIS — I350 Nonrheumatic aortic (valve) stenosis: Secondary | ICD-10-CM | POA: Diagnosis not present

## 2018-03-31 DIAGNOSIS — N183 Chronic kidney disease, stage 3 (moderate): Secondary | ICD-10-CM | POA: Diagnosis not present

## 2018-04-02 DIAGNOSIS — I509 Heart failure, unspecified: Secondary | ICD-10-CM | POA: Diagnosis not present

## 2018-04-02 DIAGNOSIS — N183 Chronic kidney disease, stage 3 (moderate): Secondary | ICD-10-CM | POA: Diagnosis not present

## 2018-04-02 DIAGNOSIS — I350 Nonrheumatic aortic (valve) stenosis: Secondary | ICD-10-CM | POA: Diagnosis not present

## 2018-04-02 DIAGNOSIS — I70263 Atherosclerosis of native arteries of extremities with gangrene, bilateral legs: Secondary | ICD-10-CM | POA: Diagnosis not present

## 2018-04-02 DIAGNOSIS — I25810 Atherosclerosis of coronary artery bypass graft(s) without angina pectoris: Secondary | ICD-10-CM | POA: Diagnosis not present

## 2018-04-02 DIAGNOSIS — G20 Parkinson's disease: Secondary | ICD-10-CM | POA: Diagnosis not present

## 2018-04-05 DIAGNOSIS — G20 Parkinson's disease: Secondary | ICD-10-CM | POA: Diagnosis not present

## 2018-04-05 DIAGNOSIS — I350 Nonrheumatic aortic (valve) stenosis: Secondary | ICD-10-CM | POA: Diagnosis not present

## 2018-04-05 DIAGNOSIS — I509 Heart failure, unspecified: Secondary | ICD-10-CM | POA: Diagnosis not present

## 2018-04-05 DIAGNOSIS — N183 Chronic kidney disease, stage 3 (moderate): Secondary | ICD-10-CM | POA: Diagnosis not present

## 2018-04-05 DIAGNOSIS — I25810 Atherosclerosis of coronary artery bypass graft(s) without angina pectoris: Secondary | ICD-10-CM | POA: Diagnosis not present

## 2018-04-05 DIAGNOSIS — I70263 Atherosclerosis of native arteries of extremities with gangrene, bilateral legs: Secondary | ICD-10-CM | POA: Diagnosis not present

## 2018-04-06 DIAGNOSIS — G20 Parkinson's disease: Secondary | ICD-10-CM | POA: Diagnosis not present

## 2018-04-06 DIAGNOSIS — I350 Nonrheumatic aortic (valve) stenosis: Secondary | ICD-10-CM | POA: Diagnosis not present

## 2018-04-06 DIAGNOSIS — I509 Heart failure, unspecified: Secondary | ICD-10-CM | POA: Diagnosis not present

## 2018-04-06 DIAGNOSIS — N183 Chronic kidney disease, stage 3 (moderate): Secondary | ICD-10-CM | POA: Diagnosis not present

## 2018-04-06 DIAGNOSIS — I70263 Atherosclerosis of native arteries of extremities with gangrene, bilateral legs: Secondary | ICD-10-CM | POA: Diagnosis not present

## 2018-04-06 DIAGNOSIS — I25810 Atherosclerosis of coronary artery bypass graft(s) without angina pectoris: Secondary | ICD-10-CM | POA: Diagnosis not present

## 2018-04-07 DIAGNOSIS — I70263 Atherosclerosis of native arteries of extremities with gangrene, bilateral legs: Secondary | ICD-10-CM | POA: Diagnosis not present

## 2018-04-07 DIAGNOSIS — G20 Parkinson's disease: Secondary | ICD-10-CM | POA: Diagnosis not present

## 2018-04-07 DIAGNOSIS — N183 Chronic kidney disease, stage 3 (moderate): Secondary | ICD-10-CM | POA: Diagnosis not present

## 2018-04-07 DIAGNOSIS — I509 Heart failure, unspecified: Secondary | ICD-10-CM | POA: Diagnosis not present

## 2018-04-07 DIAGNOSIS — I25810 Atherosclerosis of coronary artery bypass graft(s) without angina pectoris: Secondary | ICD-10-CM | POA: Diagnosis not present

## 2018-04-07 DIAGNOSIS — I350 Nonrheumatic aortic (valve) stenosis: Secondary | ICD-10-CM | POA: Diagnosis not present

## 2018-04-09 DIAGNOSIS — I25810 Atherosclerosis of coronary artery bypass graft(s) without angina pectoris: Secondary | ICD-10-CM | POA: Diagnosis not present

## 2018-04-09 DIAGNOSIS — I509 Heart failure, unspecified: Secondary | ICD-10-CM | POA: Diagnosis not present

## 2018-04-09 DIAGNOSIS — N183 Chronic kidney disease, stage 3 (moderate): Secondary | ICD-10-CM | POA: Diagnosis not present

## 2018-04-09 DIAGNOSIS — G20 Parkinson's disease: Secondary | ICD-10-CM | POA: Diagnosis not present

## 2018-04-09 DIAGNOSIS — I70263 Atherosclerosis of native arteries of extremities with gangrene, bilateral legs: Secondary | ICD-10-CM | POA: Diagnosis not present

## 2018-04-09 DIAGNOSIS — I350 Nonrheumatic aortic (valve) stenosis: Secondary | ICD-10-CM | POA: Diagnosis not present

## 2018-04-10 DIAGNOSIS — N183 Chronic kidney disease, stage 3 (moderate): Secondary | ICD-10-CM | POA: Diagnosis not present

## 2018-04-10 DIAGNOSIS — R54 Age-related physical debility: Secondary | ICD-10-CM | POA: Diagnosis not present

## 2018-04-10 DIAGNOSIS — E039 Hypothyroidism, unspecified: Secondary | ICD-10-CM | POA: Diagnosis not present

## 2018-04-10 DIAGNOSIS — G9009 Other idiopathic peripheral autonomic neuropathy: Secondary | ICD-10-CM | POA: Diagnosis not present

## 2018-04-10 DIAGNOSIS — G20 Parkinson's disease: Secondary | ICD-10-CM | POA: Diagnosis not present

## 2018-04-10 DIAGNOSIS — I350 Nonrheumatic aortic (valve) stenosis: Secondary | ICD-10-CM | POA: Diagnosis not present

## 2018-04-10 DIAGNOSIS — I10 Essential (primary) hypertension: Secondary | ICD-10-CM | POA: Diagnosis not present

## 2018-04-10 DIAGNOSIS — G3184 Mild cognitive impairment, so stated: Secondary | ICD-10-CM | POA: Diagnosis not present

## 2018-04-10 DIAGNOSIS — I509 Heart failure, unspecified: Secondary | ICD-10-CM | POA: Diagnosis not present

## 2018-04-10 DIAGNOSIS — M353 Polymyalgia rheumatica: Secondary | ICD-10-CM | POA: Diagnosis not present

## 2018-04-10 DIAGNOSIS — I25810 Atherosclerosis of coronary artery bypass graft(s) without angina pectoris: Secondary | ICD-10-CM | POA: Diagnosis not present

## 2018-04-10 DIAGNOSIS — I70263 Atherosclerosis of native arteries of extremities with gangrene, bilateral legs: Secondary | ICD-10-CM | POA: Diagnosis not present

## 2018-04-12 DIAGNOSIS — I350 Nonrheumatic aortic (valve) stenosis: Secondary | ICD-10-CM | POA: Diagnosis not present

## 2018-04-12 DIAGNOSIS — I509 Heart failure, unspecified: Secondary | ICD-10-CM | POA: Diagnosis not present

## 2018-04-12 DIAGNOSIS — I70263 Atherosclerosis of native arteries of extremities with gangrene, bilateral legs: Secondary | ICD-10-CM | POA: Diagnosis not present

## 2018-04-12 DIAGNOSIS — N183 Chronic kidney disease, stage 3 (moderate): Secondary | ICD-10-CM | POA: Diagnosis not present

## 2018-04-12 DIAGNOSIS — G20 Parkinson's disease: Secondary | ICD-10-CM | POA: Diagnosis not present

## 2018-04-12 DIAGNOSIS — I25810 Atherosclerosis of coronary artery bypass graft(s) without angina pectoris: Secondary | ICD-10-CM | POA: Diagnosis not present

## 2018-04-13 DIAGNOSIS — N183 Chronic kidney disease, stage 3 (moderate): Secondary | ICD-10-CM | POA: Diagnosis not present

## 2018-04-13 DIAGNOSIS — I350 Nonrheumatic aortic (valve) stenosis: Secondary | ICD-10-CM | POA: Diagnosis not present

## 2018-04-13 DIAGNOSIS — G20 Parkinson's disease: Secondary | ICD-10-CM | POA: Diagnosis not present

## 2018-04-13 DIAGNOSIS — I509 Heart failure, unspecified: Secondary | ICD-10-CM | POA: Diagnosis not present

## 2018-04-13 DIAGNOSIS — I70263 Atherosclerosis of native arteries of extremities with gangrene, bilateral legs: Secondary | ICD-10-CM | POA: Diagnosis not present

## 2018-04-13 DIAGNOSIS — I25810 Atherosclerosis of coronary artery bypass graft(s) without angina pectoris: Secondary | ICD-10-CM | POA: Diagnosis not present

## 2018-04-14 DIAGNOSIS — I509 Heart failure, unspecified: Secondary | ICD-10-CM | POA: Diagnosis not present

## 2018-04-14 DIAGNOSIS — I70263 Atherosclerosis of native arteries of extremities with gangrene, bilateral legs: Secondary | ICD-10-CM | POA: Diagnosis not present

## 2018-04-14 DIAGNOSIS — I25810 Atherosclerosis of coronary artery bypass graft(s) without angina pectoris: Secondary | ICD-10-CM | POA: Diagnosis not present

## 2018-04-14 DIAGNOSIS — G20 Parkinson's disease: Secondary | ICD-10-CM | POA: Diagnosis not present

## 2018-04-14 DIAGNOSIS — N183 Chronic kidney disease, stage 3 (moderate): Secondary | ICD-10-CM | POA: Diagnosis not present

## 2018-04-14 DIAGNOSIS — I350 Nonrheumatic aortic (valve) stenosis: Secondary | ICD-10-CM | POA: Diagnosis not present

## 2018-04-16 DIAGNOSIS — I70263 Atherosclerosis of native arteries of extremities with gangrene, bilateral legs: Secondary | ICD-10-CM | POA: Diagnosis not present

## 2018-04-16 DIAGNOSIS — I25810 Atherosclerosis of coronary artery bypass graft(s) without angina pectoris: Secondary | ICD-10-CM | POA: Diagnosis not present

## 2018-04-16 DIAGNOSIS — G20 Parkinson's disease: Secondary | ICD-10-CM | POA: Diagnosis not present

## 2018-04-16 DIAGNOSIS — N183 Chronic kidney disease, stage 3 (moderate): Secondary | ICD-10-CM | POA: Diagnosis not present

## 2018-04-16 DIAGNOSIS — I350 Nonrheumatic aortic (valve) stenosis: Secondary | ICD-10-CM | POA: Diagnosis not present

## 2018-04-16 DIAGNOSIS — I509 Heart failure, unspecified: Secondary | ICD-10-CM | POA: Diagnosis not present

## 2018-04-19 DIAGNOSIS — N183 Chronic kidney disease, stage 3 (moderate): Secondary | ICD-10-CM | POA: Diagnosis not present

## 2018-04-19 DIAGNOSIS — I25810 Atherosclerosis of coronary artery bypass graft(s) without angina pectoris: Secondary | ICD-10-CM | POA: Diagnosis not present

## 2018-04-19 DIAGNOSIS — I350 Nonrheumatic aortic (valve) stenosis: Secondary | ICD-10-CM | POA: Diagnosis not present

## 2018-04-19 DIAGNOSIS — G20 Parkinson's disease: Secondary | ICD-10-CM | POA: Diagnosis not present

## 2018-04-19 DIAGNOSIS — I509 Heart failure, unspecified: Secondary | ICD-10-CM | POA: Diagnosis not present

## 2018-04-19 DIAGNOSIS — I70263 Atherosclerosis of native arteries of extremities with gangrene, bilateral legs: Secondary | ICD-10-CM | POA: Diagnosis not present

## 2018-04-20 DIAGNOSIS — G20 Parkinson's disease: Secondary | ICD-10-CM | POA: Diagnosis not present

## 2018-04-20 DIAGNOSIS — I70263 Atherosclerosis of native arteries of extremities with gangrene, bilateral legs: Secondary | ICD-10-CM | POA: Diagnosis not present

## 2018-04-20 DIAGNOSIS — N183 Chronic kidney disease, stage 3 (moderate): Secondary | ICD-10-CM | POA: Diagnosis not present

## 2018-04-20 DIAGNOSIS — I509 Heart failure, unspecified: Secondary | ICD-10-CM | POA: Diagnosis not present

## 2018-04-20 DIAGNOSIS — I25810 Atherosclerosis of coronary artery bypass graft(s) without angina pectoris: Secondary | ICD-10-CM | POA: Diagnosis not present

## 2018-04-20 DIAGNOSIS — I350 Nonrheumatic aortic (valve) stenosis: Secondary | ICD-10-CM | POA: Diagnosis not present

## 2018-04-21 DIAGNOSIS — I70263 Atherosclerosis of native arteries of extremities with gangrene, bilateral legs: Secondary | ICD-10-CM | POA: Diagnosis not present

## 2018-04-21 DIAGNOSIS — I509 Heart failure, unspecified: Secondary | ICD-10-CM | POA: Diagnosis not present

## 2018-04-21 DIAGNOSIS — I25810 Atherosclerosis of coronary artery bypass graft(s) without angina pectoris: Secondary | ICD-10-CM | POA: Diagnosis not present

## 2018-04-21 DIAGNOSIS — N183 Chronic kidney disease, stage 3 (moderate): Secondary | ICD-10-CM | POA: Diagnosis not present

## 2018-04-21 DIAGNOSIS — G20 Parkinson's disease: Secondary | ICD-10-CM | POA: Diagnosis not present

## 2018-04-21 DIAGNOSIS — I350 Nonrheumatic aortic (valve) stenosis: Secondary | ICD-10-CM | POA: Diagnosis not present

## 2018-04-23 DIAGNOSIS — I350 Nonrheumatic aortic (valve) stenosis: Secondary | ICD-10-CM | POA: Diagnosis not present

## 2018-04-23 DIAGNOSIS — G20 Parkinson's disease: Secondary | ICD-10-CM | POA: Diagnosis not present

## 2018-04-23 DIAGNOSIS — I70263 Atherosclerosis of native arteries of extremities with gangrene, bilateral legs: Secondary | ICD-10-CM | POA: Diagnosis not present

## 2018-04-23 DIAGNOSIS — I509 Heart failure, unspecified: Secondary | ICD-10-CM | POA: Diagnosis not present

## 2018-04-23 DIAGNOSIS — I25810 Atherosclerosis of coronary artery bypass graft(s) without angina pectoris: Secondary | ICD-10-CM | POA: Diagnosis not present

## 2018-04-23 DIAGNOSIS — N183 Chronic kidney disease, stage 3 (moderate): Secondary | ICD-10-CM | POA: Diagnosis not present

## 2018-04-24 DIAGNOSIS — I509 Heart failure, unspecified: Secondary | ICD-10-CM | POA: Diagnosis not present

## 2018-04-24 DIAGNOSIS — I70263 Atherosclerosis of native arteries of extremities with gangrene, bilateral legs: Secondary | ICD-10-CM | POA: Diagnosis not present

## 2018-04-24 DIAGNOSIS — N183 Chronic kidney disease, stage 3 (moderate): Secondary | ICD-10-CM | POA: Diagnosis not present

## 2018-04-24 DIAGNOSIS — G20 Parkinson's disease: Secondary | ICD-10-CM | POA: Diagnosis not present

## 2018-04-24 DIAGNOSIS — I350 Nonrheumatic aortic (valve) stenosis: Secondary | ICD-10-CM | POA: Diagnosis not present

## 2018-04-24 DIAGNOSIS — I25810 Atherosclerosis of coronary artery bypass graft(s) without angina pectoris: Secondary | ICD-10-CM | POA: Diagnosis not present

## 2018-04-26 DIAGNOSIS — I509 Heart failure, unspecified: Secondary | ICD-10-CM | POA: Diagnosis not present

## 2018-04-26 DIAGNOSIS — G20 Parkinson's disease: Secondary | ICD-10-CM | POA: Diagnosis not present

## 2018-04-26 DIAGNOSIS — I25810 Atherosclerosis of coronary artery bypass graft(s) without angina pectoris: Secondary | ICD-10-CM | POA: Diagnosis not present

## 2018-04-26 DIAGNOSIS — I70263 Atherosclerosis of native arteries of extremities with gangrene, bilateral legs: Secondary | ICD-10-CM | POA: Diagnosis not present

## 2018-04-26 DIAGNOSIS — I350 Nonrheumatic aortic (valve) stenosis: Secondary | ICD-10-CM | POA: Diagnosis not present

## 2018-04-26 DIAGNOSIS — N183 Chronic kidney disease, stage 3 (moderate): Secondary | ICD-10-CM | POA: Diagnosis not present

## 2018-04-27 DIAGNOSIS — I509 Heart failure, unspecified: Secondary | ICD-10-CM | POA: Diagnosis not present

## 2018-04-27 DIAGNOSIS — I70263 Atherosclerosis of native arteries of extremities with gangrene, bilateral legs: Secondary | ICD-10-CM | POA: Diagnosis not present

## 2018-04-27 DIAGNOSIS — I25810 Atherosclerosis of coronary artery bypass graft(s) without angina pectoris: Secondary | ICD-10-CM | POA: Diagnosis not present

## 2018-04-27 DIAGNOSIS — I350 Nonrheumatic aortic (valve) stenosis: Secondary | ICD-10-CM | POA: Diagnosis not present

## 2018-04-27 DIAGNOSIS — N183 Chronic kidney disease, stage 3 (moderate): Secondary | ICD-10-CM | POA: Diagnosis not present

## 2018-04-27 DIAGNOSIS — G20 Parkinson's disease: Secondary | ICD-10-CM | POA: Diagnosis not present

## 2018-04-28 DIAGNOSIS — I25810 Atherosclerosis of coronary artery bypass graft(s) without angina pectoris: Secondary | ICD-10-CM | POA: Diagnosis not present

## 2018-04-28 DIAGNOSIS — G20 Parkinson's disease: Secondary | ICD-10-CM | POA: Diagnosis not present

## 2018-04-28 DIAGNOSIS — I350 Nonrheumatic aortic (valve) stenosis: Secondary | ICD-10-CM | POA: Diagnosis not present

## 2018-04-28 DIAGNOSIS — N183 Chronic kidney disease, stage 3 (moderate): Secondary | ICD-10-CM | POA: Diagnosis not present

## 2018-04-28 DIAGNOSIS — I70263 Atherosclerosis of native arteries of extremities with gangrene, bilateral legs: Secondary | ICD-10-CM | POA: Diagnosis not present

## 2018-04-28 DIAGNOSIS — I509 Heart failure, unspecified: Secondary | ICD-10-CM | POA: Diagnosis not present

## 2018-04-30 DIAGNOSIS — I70263 Atherosclerosis of native arteries of extremities with gangrene, bilateral legs: Secondary | ICD-10-CM | POA: Diagnosis not present

## 2018-04-30 DIAGNOSIS — I509 Heart failure, unspecified: Secondary | ICD-10-CM | POA: Diagnosis not present

## 2018-04-30 DIAGNOSIS — G20 Parkinson's disease: Secondary | ICD-10-CM | POA: Diagnosis not present

## 2018-04-30 DIAGNOSIS — N183 Chronic kidney disease, stage 3 (moderate): Secondary | ICD-10-CM | POA: Diagnosis not present

## 2018-04-30 DIAGNOSIS — I350 Nonrheumatic aortic (valve) stenosis: Secondary | ICD-10-CM | POA: Diagnosis not present

## 2018-04-30 DIAGNOSIS — I25810 Atherosclerosis of coronary artery bypass graft(s) without angina pectoris: Secondary | ICD-10-CM | POA: Diagnosis not present

## 2018-05-03 DIAGNOSIS — I25810 Atherosclerosis of coronary artery bypass graft(s) without angina pectoris: Secondary | ICD-10-CM | POA: Diagnosis not present

## 2018-05-03 DIAGNOSIS — N183 Chronic kidney disease, stage 3 (moderate): Secondary | ICD-10-CM | POA: Diagnosis not present

## 2018-05-03 DIAGNOSIS — G20 Parkinson's disease: Secondary | ICD-10-CM | POA: Diagnosis not present

## 2018-05-03 DIAGNOSIS — I350 Nonrheumatic aortic (valve) stenosis: Secondary | ICD-10-CM | POA: Diagnosis not present

## 2018-05-03 DIAGNOSIS — I70263 Atherosclerosis of native arteries of extremities with gangrene, bilateral legs: Secondary | ICD-10-CM | POA: Diagnosis not present

## 2018-05-03 DIAGNOSIS — I509 Heart failure, unspecified: Secondary | ICD-10-CM | POA: Diagnosis not present

## 2018-05-04 DIAGNOSIS — I509 Heart failure, unspecified: Secondary | ICD-10-CM | POA: Diagnosis not present

## 2018-05-04 DIAGNOSIS — I25810 Atherosclerosis of coronary artery bypass graft(s) without angina pectoris: Secondary | ICD-10-CM | POA: Diagnosis not present

## 2018-05-04 DIAGNOSIS — G20 Parkinson's disease: Secondary | ICD-10-CM | POA: Diagnosis not present

## 2018-05-04 DIAGNOSIS — N183 Chronic kidney disease, stage 3 (moderate): Secondary | ICD-10-CM | POA: Diagnosis not present

## 2018-05-04 DIAGNOSIS — I350 Nonrheumatic aortic (valve) stenosis: Secondary | ICD-10-CM | POA: Diagnosis not present

## 2018-05-04 DIAGNOSIS — I70263 Atherosclerosis of native arteries of extremities with gangrene, bilateral legs: Secondary | ICD-10-CM | POA: Diagnosis not present

## 2018-05-05 DIAGNOSIS — I509 Heart failure, unspecified: Secondary | ICD-10-CM | POA: Diagnosis not present

## 2018-05-05 DIAGNOSIS — I350 Nonrheumatic aortic (valve) stenosis: Secondary | ICD-10-CM | POA: Diagnosis not present

## 2018-05-05 DIAGNOSIS — G20 Parkinson's disease: Secondary | ICD-10-CM | POA: Diagnosis not present

## 2018-05-05 DIAGNOSIS — I25810 Atherosclerosis of coronary artery bypass graft(s) without angina pectoris: Secondary | ICD-10-CM | POA: Diagnosis not present

## 2018-05-05 DIAGNOSIS — N183 Chronic kidney disease, stage 3 (moderate): Secondary | ICD-10-CM | POA: Diagnosis not present

## 2018-05-05 DIAGNOSIS — I70263 Atherosclerosis of native arteries of extremities with gangrene, bilateral legs: Secondary | ICD-10-CM | POA: Diagnosis not present

## 2018-05-06 DIAGNOSIS — N183 Chronic kidney disease, stage 3 (moderate): Secondary | ICD-10-CM | POA: Diagnosis not present

## 2018-05-06 DIAGNOSIS — I350 Nonrheumatic aortic (valve) stenosis: Secondary | ICD-10-CM | POA: Diagnosis not present

## 2018-05-06 DIAGNOSIS — I25810 Atherosclerosis of coronary artery bypass graft(s) without angina pectoris: Secondary | ICD-10-CM | POA: Diagnosis not present

## 2018-05-06 DIAGNOSIS — I70263 Atherosclerosis of native arteries of extremities with gangrene, bilateral legs: Secondary | ICD-10-CM | POA: Diagnosis not present

## 2018-05-06 DIAGNOSIS — I509 Heart failure, unspecified: Secondary | ICD-10-CM | POA: Diagnosis not present

## 2018-05-06 DIAGNOSIS — G20 Parkinson's disease: Secondary | ICD-10-CM | POA: Diagnosis not present

## 2018-05-07 DIAGNOSIS — N183 Chronic kidney disease, stage 3 (moderate): Secondary | ICD-10-CM | POA: Diagnosis not present

## 2018-05-07 DIAGNOSIS — I70263 Atherosclerosis of native arteries of extremities with gangrene, bilateral legs: Secondary | ICD-10-CM | POA: Diagnosis not present

## 2018-05-07 DIAGNOSIS — I509 Heart failure, unspecified: Secondary | ICD-10-CM | POA: Diagnosis not present

## 2018-05-07 DIAGNOSIS — G20 Parkinson's disease: Secondary | ICD-10-CM | POA: Diagnosis not present

## 2018-05-07 DIAGNOSIS — I25810 Atherosclerosis of coronary artery bypass graft(s) without angina pectoris: Secondary | ICD-10-CM | POA: Diagnosis not present

## 2018-05-07 DIAGNOSIS — I350 Nonrheumatic aortic (valve) stenosis: Secondary | ICD-10-CM | POA: Diagnosis not present

## 2018-05-10 DIAGNOSIS — I10 Essential (primary) hypertension: Secondary | ICD-10-CM | POA: Diagnosis not present

## 2018-05-10 DIAGNOSIS — G9009 Other idiopathic peripheral autonomic neuropathy: Secondary | ICD-10-CM | POA: Diagnosis not present

## 2018-05-10 DIAGNOSIS — I350 Nonrheumatic aortic (valve) stenosis: Secondary | ICD-10-CM | POA: Diagnosis not present

## 2018-05-10 DIAGNOSIS — I509 Heart failure, unspecified: Secondary | ICD-10-CM | POA: Diagnosis not present

## 2018-05-10 DIAGNOSIS — G20 Parkinson's disease: Secondary | ICD-10-CM | POA: Diagnosis not present

## 2018-05-10 DIAGNOSIS — I25810 Atherosclerosis of coronary artery bypass graft(s) without angina pectoris: Secondary | ICD-10-CM | POA: Diagnosis not present

## 2018-05-10 DIAGNOSIS — R54 Age-related physical debility: Secondary | ICD-10-CM | POA: Diagnosis not present

## 2018-05-10 DIAGNOSIS — M353 Polymyalgia rheumatica: Secondary | ICD-10-CM | POA: Diagnosis not present

## 2018-05-10 DIAGNOSIS — E039 Hypothyroidism, unspecified: Secondary | ICD-10-CM | POA: Diagnosis not present

## 2018-05-10 DIAGNOSIS — G3184 Mild cognitive impairment, so stated: Secondary | ICD-10-CM | POA: Diagnosis not present

## 2018-05-10 DIAGNOSIS — I70263 Atherosclerosis of native arteries of extremities with gangrene, bilateral legs: Secondary | ICD-10-CM | POA: Diagnosis not present

## 2018-05-10 DIAGNOSIS — N183 Chronic kidney disease, stage 3 (moderate): Secondary | ICD-10-CM | POA: Diagnosis not present

## 2018-05-11 DIAGNOSIS — I70263 Atherosclerosis of native arteries of extremities with gangrene, bilateral legs: Secondary | ICD-10-CM | POA: Diagnosis not present

## 2018-05-11 DIAGNOSIS — I350 Nonrheumatic aortic (valve) stenosis: Secondary | ICD-10-CM | POA: Diagnosis not present

## 2018-05-11 DIAGNOSIS — N183 Chronic kidney disease, stage 3 (moderate): Secondary | ICD-10-CM | POA: Diagnosis not present

## 2018-05-11 DIAGNOSIS — G20 Parkinson's disease: Secondary | ICD-10-CM | POA: Diagnosis not present

## 2018-05-11 DIAGNOSIS — I509 Heart failure, unspecified: Secondary | ICD-10-CM | POA: Diagnosis not present

## 2018-05-11 DIAGNOSIS — I25810 Atherosclerosis of coronary artery bypass graft(s) without angina pectoris: Secondary | ICD-10-CM | POA: Diagnosis not present

## 2018-05-12 DIAGNOSIS — I350 Nonrheumatic aortic (valve) stenosis: Secondary | ICD-10-CM | POA: Diagnosis not present

## 2018-05-12 DIAGNOSIS — I509 Heart failure, unspecified: Secondary | ICD-10-CM | POA: Diagnosis not present

## 2018-05-12 DIAGNOSIS — I70263 Atherosclerosis of native arteries of extremities with gangrene, bilateral legs: Secondary | ICD-10-CM | POA: Diagnosis not present

## 2018-05-12 DIAGNOSIS — N183 Chronic kidney disease, stage 3 (moderate): Secondary | ICD-10-CM | POA: Diagnosis not present

## 2018-05-12 DIAGNOSIS — I25810 Atherosclerosis of coronary artery bypass graft(s) without angina pectoris: Secondary | ICD-10-CM | POA: Diagnosis not present

## 2018-05-12 DIAGNOSIS — G20 Parkinson's disease: Secondary | ICD-10-CM | POA: Diagnosis not present

## 2018-05-14 DIAGNOSIS — G20 Parkinson's disease: Secondary | ICD-10-CM | POA: Diagnosis not present

## 2018-05-14 DIAGNOSIS — I350 Nonrheumatic aortic (valve) stenosis: Secondary | ICD-10-CM | POA: Diagnosis not present

## 2018-05-14 DIAGNOSIS — I509 Heart failure, unspecified: Secondary | ICD-10-CM | POA: Diagnosis not present

## 2018-05-14 DIAGNOSIS — I25810 Atherosclerosis of coronary artery bypass graft(s) without angina pectoris: Secondary | ICD-10-CM | POA: Diagnosis not present

## 2018-05-14 DIAGNOSIS — N183 Chronic kidney disease, stage 3 (moderate): Secondary | ICD-10-CM | POA: Diagnosis not present

## 2018-05-14 DIAGNOSIS — I70263 Atherosclerosis of native arteries of extremities with gangrene, bilateral legs: Secondary | ICD-10-CM | POA: Diagnosis not present

## 2018-05-17 DIAGNOSIS — I350 Nonrheumatic aortic (valve) stenosis: Secondary | ICD-10-CM | POA: Diagnosis not present

## 2018-05-17 DIAGNOSIS — N183 Chronic kidney disease, stage 3 (moderate): Secondary | ICD-10-CM | POA: Diagnosis not present

## 2018-05-17 DIAGNOSIS — I509 Heart failure, unspecified: Secondary | ICD-10-CM | POA: Diagnosis not present

## 2018-05-17 DIAGNOSIS — I70263 Atherosclerosis of native arteries of extremities with gangrene, bilateral legs: Secondary | ICD-10-CM | POA: Diagnosis not present

## 2018-05-17 DIAGNOSIS — I25810 Atherosclerosis of coronary artery bypass graft(s) without angina pectoris: Secondary | ICD-10-CM | POA: Diagnosis not present

## 2018-05-17 DIAGNOSIS — G20 Parkinson's disease: Secondary | ICD-10-CM | POA: Diagnosis not present

## 2018-05-18 DIAGNOSIS — I509 Heart failure, unspecified: Secondary | ICD-10-CM | POA: Diagnosis not present

## 2018-05-18 DIAGNOSIS — I350 Nonrheumatic aortic (valve) stenosis: Secondary | ICD-10-CM | POA: Diagnosis not present

## 2018-05-18 DIAGNOSIS — N183 Chronic kidney disease, stage 3 (moderate): Secondary | ICD-10-CM | POA: Diagnosis not present

## 2018-05-18 DIAGNOSIS — I70263 Atherosclerosis of native arteries of extremities with gangrene, bilateral legs: Secondary | ICD-10-CM | POA: Diagnosis not present

## 2018-05-18 DIAGNOSIS — G20 Parkinson's disease: Secondary | ICD-10-CM | POA: Diagnosis not present

## 2018-05-18 DIAGNOSIS — I25810 Atherosclerosis of coronary artery bypass graft(s) without angina pectoris: Secondary | ICD-10-CM | POA: Diagnosis not present

## 2018-05-19 DIAGNOSIS — I25810 Atherosclerosis of coronary artery bypass graft(s) without angina pectoris: Secondary | ICD-10-CM | POA: Diagnosis not present

## 2018-05-19 DIAGNOSIS — N183 Chronic kidney disease, stage 3 (moderate): Secondary | ICD-10-CM | POA: Diagnosis not present

## 2018-05-19 DIAGNOSIS — I70263 Atherosclerosis of native arteries of extremities with gangrene, bilateral legs: Secondary | ICD-10-CM | POA: Diagnosis not present

## 2018-05-19 DIAGNOSIS — I350 Nonrheumatic aortic (valve) stenosis: Secondary | ICD-10-CM | POA: Diagnosis not present

## 2018-05-19 DIAGNOSIS — I509 Heart failure, unspecified: Secondary | ICD-10-CM | POA: Diagnosis not present

## 2018-05-19 DIAGNOSIS — G20 Parkinson's disease: Secondary | ICD-10-CM | POA: Diagnosis not present

## 2018-05-21 DIAGNOSIS — I25810 Atherosclerosis of coronary artery bypass graft(s) without angina pectoris: Secondary | ICD-10-CM | POA: Diagnosis not present

## 2018-05-21 DIAGNOSIS — I70263 Atherosclerosis of native arteries of extremities with gangrene, bilateral legs: Secondary | ICD-10-CM | POA: Diagnosis not present

## 2018-05-21 DIAGNOSIS — G20 Parkinson's disease: Secondary | ICD-10-CM | POA: Diagnosis not present

## 2018-05-21 DIAGNOSIS — I350 Nonrheumatic aortic (valve) stenosis: Secondary | ICD-10-CM | POA: Diagnosis not present

## 2018-05-21 DIAGNOSIS — I509 Heart failure, unspecified: Secondary | ICD-10-CM | POA: Diagnosis not present

## 2018-05-21 DIAGNOSIS — N183 Chronic kidney disease, stage 3 (moderate): Secondary | ICD-10-CM | POA: Diagnosis not present

## 2018-05-24 DIAGNOSIS — G20 Parkinson's disease: Secondary | ICD-10-CM | POA: Diagnosis not present

## 2018-05-24 DIAGNOSIS — N183 Chronic kidney disease, stage 3 (moderate): Secondary | ICD-10-CM | POA: Diagnosis not present

## 2018-05-24 DIAGNOSIS — I350 Nonrheumatic aortic (valve) stenosis: Secondary | ICD-10-CM | POA: Diagnosis not present

## 2018-05-24 DIAGNOSIS — I70263 Atherosclerosis of native arteries of extremities with gangrene, bilateral legs: Secondary | ICD-10-CM | POA: Diagnosis not present

## 2018-05-24 DIAGNOSIS — I25810 Atherosclerosis of coronary artery bypass graft(s) without angina pectoris: Secondary | ICD-10-CM | POA: Diagnosis not present

## 2018-05-24 DIAGNOSIS — I509 Heart failure, unspecified: Secondary | ICD-10-CM | POA: Diagnosis not present

## 2018-05-25 DIAGNOSIS — I70263 Atherosclerosis of native arteries of extremities with gangrene, bilateral legs: Secondary | ICD-10-CM | POA: Diagnosis not present

## 2018-05-25 DIAGNOSIS — G20 Parkinson's disease: Secondary | ICD-10-CM | POA: Diagnosis not present

## 2018-05-25 DIAGNOSIS — I25810 Atherosclerosis of coronary artery bypass graft(s) without angina pectoris: Secondary | ICD-10-CM | POA: Diagnosis not present

## 2018-05-25 DIAGNOSIS — I350 Nonrheumatic aortic (valve) stenosis: Secondary | ICD-10-CM | POA: Diagnosis not present

## 2018-05-25 DIAGNOSIS — I509 Heart failure, unspecified: Secondary | ICD-10-CM | POA: Diagnosis not present

## 2018-05-25 DIAGNOSIS — N183 Chronic kidney disease, stage 3 (moderate): Secondary | ICD-10-CM | POA: Diagnosis not present

## 2018-05-26 DIAGNOSIS — I25810 Atherosclerosis of coronary artery bypass graft(s) without angina pectoris: Secondary | ICD-10-CM | POA: Diagnosis not present

## 2018-05-26 DIAGNOSIS — G20 Parkinson's disease: Secondary | ICD-10-CM | POA: Diagnosis not present

## 2018-05-26 DIAGNOSIS — I350 Nonrheumatic aortic (valve) stenosis: Secondary | ICD-10-CM | POA: Diagnosis not present

## 2018-05-26 DIAGNOSIS — I70263 Atherosclerosis of native arteries of extremities with gangrene, bilateral legs: Secondary | ICD-10-CM | POA: Diagnosis not present

## 2018-05-26 DIAGNOSIS — N183 Chronic kidney disease, stage 3 (moderate): Secondary | ICD-10-CM | POA: Diagnosis not present

## 2018-05-26 DIAGNOSIS — I509 Heart failure, unspecified: Secondary | ICD-10-CM | POA: Diagnosis not present

## 2018-05-28 DIAGNOSIS — I25810 Atherosclerosis of coronary artery bypass graft(s) without angina pectoris: Secondary | ICD-10-CM | POA: Diagnosis not present

## 2018-05-28 DIAGNOSIS — I70263 Atherosclerosis of native arteries of extremities with gangrene, bilateral legs: Secondary | ICD-10-CM | POA: Diagnosis not present

## 2018-05-28 DIAGNOSIS — N183 Chronic kidney disease, stage 3 (moderate): Secondary | ICD-10-CM | POA: Diagnosis not present

## 2018-05-28 DIAGNOSIS — I350 Nonrheumatic aortic (valve) stenosis: Secondary | ICD-10-CM | POA: Diagnosis not present

## 2018-05-28 DIAGNOSIS — G20 Parkinson's disease: Secondary | ICD-10-CM | POA: Diagnosis not present

## 2018-05-28 DIAGNOSIS — I509 Heart failure, unspecified: Secondary | ICD-10-CM | POA: Diagnosis not present

## 2018-05-31 DIAGNOSIS — I70263 Atherosclerosis of native arteries of extremities with gangrene, bilateral legs: Secondary | ICD-10-CM | POA: Diagnosis not present

## 2018-05-31 DIAGNOSIS — I509 Heart failure, unspecified: Secondary | ICD-10-CM | POA: Diagnosis not present

## 2018-05-31 DIAGNOSIS — I25810 Atherosclerosis of coronary artery bypass graft(s) without angina pectoris: Secondary | ICD-10-CM | POA: Diagnosis not present

## 2018-05-31 DIAGNOSIS — I350 Nonrheumatic aortic (valve) stenosis: Secondary | ICD-10-CM | POA: Diagnosis not present

## 2018-05-31 DIAGNOSIS — N183 Chronic kidney disease, stage 3 (moderate): Secondary | ICD-10-CM | POA: Diagnosis not present

## 2018-05-31 DIAGNOSIS — G20 Parkinson's disease: Secondary | ICD-10-CM | POA: Diagnosis not present

## 2018-06-01 DIAGNOSIS — I70263 Atherosclerosis of native arteries of extremities with gangrene, bilateral legs: Secondary | ICD-10-CM | POA: Diagnosis not present

## 2018-06-01 DIAGNOSIS — N183 Chronic kidney disease, stage 3 (moderate): Secondary | ICD-10-CM | POA: Diagnosis not present

## 2018-06-01 DIAGNOSIS — I25810 Atherosclerosis of coronary artery bypass graft(s) without angina pectoris: Secondary | ICD-10-CM | POA: Diagnosis not present

## 2018-06-01 DIAGNOSIS — G20 Parkinson's disease: Secondary | ICD-10-CM | POA: Diagnosis not present

## 2018-06-01 DIAGNOSIS — I509 Heart failure, unspecified: Secondary | ICD-10-CM | POA: Diagnosis not present

## 2018-06-01 DIAGNOSIS — I350 Nonrheumatic aortic (valve) stenosis: Secondary | ICD-10-CM | POA: Diagnosis not present

## 2018-06-02 DIAGNOSIS — I70263 Atherosclerosis of native arteries of extremities with gangrene, bilateral legs: Secondary | ICD-10-CM | POA: Diagnosis not present

## 2018-06-02 DIAGNOSIS — I25810 Atherosclerosis of coronary artery bypass graft(s) without angina pectoris: Secondary | ICD-10-CM | POA: Diagnosis not present

## 2018-06-02 DIAGNOSIS — G20 Parkinson's disease: Secondary | ICD-10-CM | POA: Diagnosis not present

## 2018-06-02 DIAGNOSIS — I509 Heart failure, unspecified: Secondary | ICD-10-CM | POA: Diagnosis not present

## 2018-06-02 DIAGNOSIS — I350 Nonrheumatic aortic (valve) stenosis: Secondary | ICD-10-CM | POA: Diagnosis not present

## 2018-06-02 DIAGNOSIS — N183 Chronic kidney disease, stage 3 (moderate): Secondary | ICD-10-CM | POA: Diagnosis not present

## 2018-06-04 DIAGNOSIS — N183 Chronic kidney disease, stage 3 (moderate): Secondary | ICD-10-CM | POA: Diagnosis not present

## 2018-06-04 DIAGNOSIS — I350 Nonrheumatic aortic (valve) stenosis: Secondary | ICD-10-CM | POA: Diagnosis not present

## 2018-06-04 DIAGNOSIS — I70263 Atherosclerosis of native arteries of extremities with gangrene, bilateral legs: Secondary | ICD-10-CM | POA: Diagnosis not present

## 2018-06-04 DIAGNOSIS — G20 Parkinson's disease: Secondary | ICD-10-CM | POA: Diagnosis not present

## 2018-06-04 DIAGNOSIS — I25810 Atherosclerosis of coronary artery bypass graft(s) without angina pectoris: Secondary | ICD-10-CM | POA: Diagnosis not present

## 2018-06-04 DIAGNOSIS — I509 Heart failure, unspecified: Secondary | ICD-10-CM | POA: Diagnosis not present

## 2018-06-07 DIAGNOSIS — I350 Nonrheumatic aortic (valve) stenosis: Secondary | ICD-10-CM | POA: Diagnosis not present

## 2018-06-07 DIAGNOSIS — I509 Heart failure, unspecified: Secondary | ICD-10-CM | POA: Diagnosis not present

## 2018-06-07 DIAGNOSIS — G20 Parkinson's disease: Secondary | ICD-10-CM | POA: Diagnosis not present

## 2018-06-07 DIAGNOSIS — I25810 Atherosclerosis of coronary artery bypass graft(s) without angina pectoris: Secondary | ICD-10-CM | POA: Diagnosis not present

## 2018-06-07 DIAGNOSIS — I70263 Atherosclerosis of native arteries of extremities with gangrene, bilateral legs: Secondary | ICD-10-CM | POA: Diagnosis not present

## 2018-06-07 DIAGNOSIS — N183 Chronic kidney disease, stage 3 (moderate): Secondary | ICD-10-CM | POA: Diagnosis not present

## 2018-06-08 DIAGNOSIS — I509 Heart failure, unspecified: Secondary | ICD-10-CM | POA: Diagnosis not present

## 2018-06-08 DIAGNOSIS — I70263 Atherosclerosis of native arteries of extremities with gangrene, bilateral legs: Secondary | ICD-10-CM | POA: Diagnosis not present

## 2018-06-08 DIAGNOSIS — I25810 Atherosclerosis of coronary artery bypass graft(s) without angina pectoris: Secondary | ICD-10-CM | POA: Diagnosis not present

## 2018-06-08 DIAGNOSIS — N183 Chronic kidney disease, stage 3 (moderate): Secondary | ICD-10-CM | POA: Diagnosis not present

## 2018-06-08 DIAGNOSIS — G20 Parkinson's disease: Secondary | ICD-10-CM | POA: Diagnosis not present

## 2018-06-08 DIAGNOSIS — I350 Nonrheumatic aortic (valve) stenosis: Secondary | ICD-10-CM | POA: Diagnosis not present

## 2018-06-10 DIAGNOSIS — M353 Polymyalgia rheumatica: Secondary | ICD-10-CM | POA: Diagnosis not present

## 2018-06-10 DIAGNOSIS — I509 Heart failure, unspecified: Secondary | ICD-10-CM | POA: Diagnosis not present

## 2018-06-10 DIAGNOSIS — E039 Hypothyroidism, unspecified: Secondary | ICD-10-CM | POA: Diagnosis not present

## 2018-06-10 DIAGNOSIS — G9009 Other idiopathic peripheral autonomic neuropathy: Secondary | ICD-10-CM | POA: Diagnosis not present

## 2018-06-10 DIAGNOSIS — G20 Parkinson's disease: Secondary | ICD-10-CM | POA: Diagnosis not present

## 2018-06-10 DIAGNOSIS — I25810 Atherosclerosis of coronary artery bypass graft(s) without angina pectoris: Secondary | ICD-10-CM | POA: Diagnosis not present

## 2018-06-10 DIAGNOSIS — I10 Essential (primary) hypertension: Secondary | ICD-10-CM | POA: Diagnosis not present

## 2018-06-10 DIAGNOSIS — G3184 Mild cognitive impairment, so stated: Secondary | ICD-10-CM | POA: Diagnosis not present

## 2018-06-10 DIAGNOSIS — I70263 Atherosclerosis of native arteries of extremities with gangrene, bilateral legs: Secondary | ICD-10-CM | POA: Diagnosis not present

## 2018-06-10 DIAGNOSIS — R54 Age-related physical debility: Secondary | ICD-10-CM | POA: Diagnosis not present

## 2018-06-10 DIAGNOSIS — N183 Chronic kidney disease, stage 3 (moderate): Secondary | ICD-10-CM | POA: Diagnosis not present

## 2018-06-10 DIAGNOSIS — I350 Nonrheumatic aortic (valve) stenosis: Secondary | ICD-10-CM | POA: Diagnosis not present

## 2018-06-11 DIAGNOSIS — N183 Chronic kidney disease, stage 3 (moderate): Secondary | ICD-10-CM | POA: Diagnosis not present

## 2018-06-11 DIAGNOSIS — I70263 Atherosclerosis of native arteries of extremities with gangrene, bilateral legs: Secondary | ICD-10-CM | POA: Diagnosis not present

## 2018-06-11 DIAGNOSIS — I350 Nonrheumatic aortic (valve) stenosis: Secondary | ICD-10-CM | POA: Diagnosis not present

## 2018-06-11 DIAGNOSIS — I25810 Atherosclerosis of coronary artery bypass graft(s) without angina pectoris: Secondary | ICD-10-CM | POA: Diagnosis not present

## 2018-06-11 DIAGNOSIS — I509 Heart failure, unspecified: Secondary | ICD-10-CM | POA: Diagnosis not present

## 2018-06-11 DIAGNOSIS — G20 Parkinson's disease: Secondary | ICD-10-CM | POA: Diagnosis not present

## 2018-06-14 DIAGNOSIS — G20 Parkinson's disease: Secondary | ICD-10-CM | POA: Diagnosis not present

## 2018-06-14 DIAGNOSIS — I25810 Atherosclerosis of coronary artery bypass graft(s) without angina pectoris: Secondary | ICD-10-CM | POA: Diagnosis not present

## 2018-06-14 DIAGNOSIS — I70263 Atherosclerosis of native arteries of extremities with gangrene, bilateral legs: Secondary | ICD-10-CM | POA: Diagnosis not present

## 2018-06-14 DIAGNOSIS — I509 Heart failure, unspecified: Secondary | ICD-10-CM | POA: Diagnosis not present

## 2018-06-14 DIAGNOSIS — N183 Chronic kidney disease, stage 3 (moderate): Secondary | ICD-10-CM | POA: Diagnosis not present

## 2018-06-14 DIAGNOSIS — I350 Nonrheumatic aortic (valve) stenosis: Secondary | ICD-10-CM | POA: Diagnosis not present

## 2018-06-15 DIAGNOSIS — G20 Parkinson's disease: Secondary | ICD-10-CM | POA: Diagnosis not present

## 2018-06-15 DIAGNOSIS — I350 Nonrheumatic aortic (valve) stenosis: Secondary | ICD-10-CM | POA: Diagnosis not present

## 2018-06-15 DIAGNOSIS — I70263 Atherosclerosis of native arteries of extremities with gangrene, bilateral legs: Secondary | ICD-10-CM | POA: Diagnosis not present

## 2018-06-15 DIAGNOSIS — I509 Heart failure, unspecified: Secondary | ICD-10-CM | POA: Diagnosis not present

## 2018-06-15 DIAGNOSIS — I25810 Atherosclerosis of coronary artery bypass graft(s) without angina pectoris: Secondary | ICD-10-CM | POA: Diagnosis not present

## 2018-06-15 DIAGNOSIS — N183 Chronic kidney disease, stage 3 (moderate): Secondary | ICD-10-CM | POA: Diagnosis not present

## 2018-06-18 DIAGNOSIS — I350 Nonrheumatic aortic (valve) stenosis: Secondary | ICD-10-CM | POA: Diagnosis not present

## 2018-06-18 DIAGNOSIS — I70263 Atherosclerosis of native arteries of extremities with gangrene, bilateral legs: Secondary | ICD-10-CM | POA: Diagnosis not present

## 2018-06-18 DIAGNOSIS — G20 Parkinson's disease: Secondary | ICD-10-CM | POA: Diagnosis not present

## 2018-06-18 DIAGNOSIS — N183 Chronic kidney disease, stage 3 (moderate): Secondary | ICD-10-CM | POA: Diagnosis not present

## 2018-06-18 DIAGNOSIS — I509 Heart failure, unspecified: Secondary | ICD-10-CM | POA: Diagnosis not present

## 2018-06-18 DIAGNOSIS — I25810 Atherosclerosis of coronary artery bypass graft(s) without angina pectoris: Secondary | ICD-10-CM | POA: Diagnosis not present

## 2018-06-21 DIAGNOSIS — N183 Chronic kidney disease, stage 3 (moderate): Secondary | ICD-10-CM | POA: Diagnosis not present

## 2018-06-21 DIAGNOSIS — G20 Parkinson's disease: Secondary | ICD-10-CM | POA: Diagnosis not present

## 2018-06-21 DIAGNOSIS — I350 Nonrheumatic aortic (valve) stenosis: Secondary | ICD-10-CM | POA: Diagnosis not present

## 2018-06-21 DIAGNOSIS — I70263 Atherosclerosis of native arteries of extremities with gangrene, bilateral legs: Secondary | ICD-10-CM | POA: Diagnosis not present

## 2018-06-21 DIAGNOSIS — I509 Heart failure, unspecified: Secondary | ICD-10-CM | POA: Diagnosis not present

## 2018-06-21 DIAGNOSIS — I25810 Atherosclerosis of coronary artery bypass graft(s) without angina pectoris: Secondary | ICD-10-CM | POA: Diagnosis not present

## 2018-06-22 DIAGNOSIS — G20 Parkinson's disease: Secondary | ICD-10-CM | POA: Diagnosis not present

## 2018-06-22 DIAGNOSIS — N183 Chronic kidney disease, stage 3 (moderate): Secondary | ICD-10-CM | POA: Diagnosis not present

## 2018-06-22 DIAGNOSIS — I70263 Atherosclerosis of native arteries of extremities with gangrene, bilateral legs: Secondary | ICD-10-CM | POA: Diagnosis not present

## 2018-06-22 DIAGNOSIS — I350 Nonrheumatic aortic (valve) stenosis: Secondary | ICD-10-CM | POA: Diagnosis not present

## 2018-06-22 DIAGNOSIS — I509 Heart failure, unspecified: Secondary | ICD-10-CM | POA: Diagnosis not present

## 2018-06-22 DIAGNOSIS — I25810 Atherosclerosis of coronary artery bypass graft(s) without angina pectoris: Secondary | ICD-10-CM | POA: Diagnosis not present

## 2018-06-25 DIAGNOSIS — N183 Chronic kidney disease, stage 3 (moderate): Secondary | ICD-10-CM | POA: Diagnosis not present

## 2018-06-25 DIAGNOSIS — I70263 Atherosclerosis of native arteries of extremities with gangrene, bilateral legs: Secondary | ICD-10-CM | POA: Diagnosis not present

## 2018-06-25 DIAGNOSIS — G20 Parkinson's disease: Secondary | ICD-10-CM | POA: Diagnosis not present

## 2018-06-25 DIAGNOSIS — I350 Nonrheumatic aortic (valve) stenosis: Secondary | ICD-10-CM | POA: Diagnosis not present

## 2018-06-25 DIAGNOSIS — I25810 Atherosclerosis of coronary artery bypass graft(s) without angina pectoris: Secondary | ICD-10-CM | POA: Diagnosis not present

## 2018-06-25 DIAGNOSIS — I509 Heart failure, unspecified: Secondary | ICD-10-CM | POA: Diagnosis not present

## 2018-06-28 DIAGNOSIS — N183 Chronic kidney disease, stage 3 (moderate): Secondary | ICD-10-CM | POA: Diagnosis not present

## 2018-06-28 DIAGNOSIS — I25810 Atherosclerosis of coronary artery bypass graft(s) without angina pectoris: Secondary | ICD-10-CM | POA: Diagnosis not present

## 2018-06-28 DIAGNOSIS — I70263 Atherosclerosis of native arteries of extremities with gangrene, bilateral legs: Secondary | ICD-10-CM | POA: Diagnosis not present

## 2018-06-28 DIAGNOSIS — I509 Heart failure, unspecified: Secondary | ICD-10-CM | POA: Diagnosis not present

## 2018-06-28 DIAGNOSIS — I350 Nonrheumatic aortic (valve) stenosis: Secondary | ICD-10-CM | POA: Diagnosis not present

## 2018-06-28 DIAGNOSIS — G20 Parkinson's disease: Secondary | ICD-10-CM | POA: Diagnosis not present

## 2018-06-29 DIAGNOSIS — G20 Parkinson's disease: Secondary | ICD-10-CM | POA: Diagnosis not present

## 2018-06-29 DIAGNOSIS — I350 Nonrheumatic aortic (valve) stenosis: Secondary | ICD-10-CM | POA: Diagnosis not present

## 2018-06-29 DIAGNOSIS — I70263 Atherosclerosis of native arteries of extremities with gangrene, bilateral legs: Secondary | ICD-10-CM | POA: Diagnosis not present

## 2018-06-29 DIAGNOSIS — I509 Heart failure, unspecified: Secondary | ICD-10-CM | POA: Diagnosis not present

## 2018-06-29 DIAGNOSIS — N183 Chronic kidney disease, stage 3 (moderate): Secondary | ICD-10-CM | POA: Diagnosis not present

## 2018-06-29 DIAGNOSIS — I25810 Atherosclerosis of coronary artery bypass graft(s) without angina pectoris: Secondary | ICD-10-CM | POA: Diagnosis not present

## 2018-07-01 DIAGNOSIS — G20 Parkinson's disease: Secondary | ICD-10-CM | POA: Diagnosis not present

## 2018-07-01 DIAGNOSIS — I70263 Atherosclerosis of native arteries of extremities with gangrene, bilateral legs: Secondary | ICD-10-CM | POA: Diagnosis not present

## 2018-07-01 DIAGNOSIS — I25810 Atherosclerosis of coronary artery bypass graft(s) without angina pectoris: Secondary | ICD-10-CM | POA: Diagnosis not present

## 2018-07-01 DIAGNOSIS — N183 Chronic kidney disease, stage 3 (moderate): Secondary | ICD-10-CM | POA: Diagnosis not present

## 2018-07-01 DIAGNOSIS — I509 Heart failure, unspecified: Secondary | ICD-10-CM | POA: Diagnosis not present

## 2018-07-01 DIAGNOSIS — I350 Nonrheumatic aortic (valve) stenosis: Secondary | ICD-10-CM | POA: Diagnosis not present

## 2018-07-02 DIAGNOSIS — I350 Nonrheumatic aortic (valve) stenosis: Secondary | ICD-10-CM | POA: Diagnosis not present

## 2018-07-02 DIAGNOSIS — I509 Heart failure, unspecified: Secondary | ICD-10-CM | POA: Diagnosis not present

## 2018-07-02 DIAGNOSIS — G20 Parkinson's disease: Secondary | ICD-10-CM | POA: Diagnosis not present

## 2018-07-02 DIAGNOSIS — I25810 Atherosclerosis of coronary artery bypass graft(s) without angina pectoris: Secondary | ICD-10-CM | POA: Diagnosis not present

## 2018-07-02 DIAGNOSIS — N183 Chronic kidney disease, stage 3 (moderate): Secondary | ICD-10-CM | POA: Diagnosis not present

## 2018-07-02 DIAGNOSIS — I70263 Atherosclerosis of native arteries of extremities with gangrene, bilateral legs: Secondary | ICD-10-CM | POA: Diagnosis not present

## 2018-07-06 DIAGNOSIS — I25810 Atherosclerosis of coronary artery bypass graft(s) without angina pectoris: Secondary | ICD-10-CM | POA: Diagnosis not present

## 2018-07-06 DIAGNOSIS — I350 Nonrheumatic aortic (valve) stenosis: Secondary | ICD-10-CM | POA: Diagnosis not present

## 2018-07-06 DIAGNOSIS — G20 Parkinson's disease: Secondary | ICD-10-CM | POA: Diagnosis not present

## 2018-07-06 DIAGNOSIS — N183 Chronic kidney disease, stage 3 (moderate): Secondary | ICD-10-CM | POA: Diagnosis not present

## 2018-07-06 DIAGNOSIS — I70263 Atherosclerosis of native arteries of extremities with gangrene, bilateral legs: Secondary | ICD-10-CM | POA: Diagnosis not present

## 2018-07-06 DIAGNOSIS — I509 Heart failure, unspecified: Secondary | ICD-10-CM | POA: Diagnosis not present

## 2018-07-09 DIAGNOSIS — I70263 Atherosclerosis of native arteries of extremities with gangrene, bilateral legs: Secondary | ICD-10-CM | POA: Diagnosis not present

## 2018-07-09 DIAGNOSIS — I350 Nonrheumatic aortic (valve) stenosis: Secondary | ICD-10-CM | POA: Diagnosis not present

## 2018-07-09 DIAGNOSIS — I25810 Atherosclerosis of coronary artery bypass graft(s) without angina pectoris: Secondary | ICD-10-CM | POA: Diagnosis not present

## 2018-07-09 DIAGNOSIS — N183 Chronic kidney disease, stage 3 (moderate): Secondary | ICD-10-CM | POA: Diagnosis not present

## 2018-07-09 DIAGNOSIS — I509 Heart failure, unspecified: Secondary | ICD-10-CM | POA: Diagnosis not present

## 2018-07-09 DIAGNOSIS — G20 Parkinson's disease: Secondary | ICD-10-CM | POA: Diagnosis not present

## 2018-07-10 DIAGNOSIS — I350 Nonrheumatic aortic (valve) stenosis: Secondary | ICD-10-CM | POA: Diagnosis not present

## 2018-07-10 DIAGNOSIS — I25810 Atherosclerosis of coronary artery bypass graft(s) without angina pectoris: Secondary | ICD-10-CM | POA: Diagnosis not present

## 2018-07-10 DIAGNOSIS — I70263 Atherosclerosis of native arteries of extremities with gangrene, bilateral legs: Secondary | ICD-10-CM | POA: Diagnosis not present

## 2018-07-10 DIAGNOSIS — G20 Parkinson's disease: Secondary | ICD-10-CM | POA: Diagnosis not present

## 2018-07-10 DIAGNOSIS — I509 Heart failure, unspecified: Secondary | ICD-10-CM | POA: Diagnosis not present

## 2018-07-10 DIAGNOSIS — N183 Chronic kidney disease, stage 3 (moderate): Secondary | ICD-10-CM | POA: Diagnosis not present

## 2018-07-11 DIAGNOSIS — N183 Chronic kidney disease, stage 3 (moderate): Secondary | ICD-10-CM | POA: Diagnosis not present

## 2018-07-11 DIAGNOSIS — M353 Polymyalgia rheumatica: Secondary | ICD-10-CM | POA: Diagnosis not present

## 2018-07-11 DIAGNOSIS — I10 Essential (primary) hypertension: Secondary | ICD-10-CM | POA: Diagnosis not present

## 2018-07-11 DIAGNOSIS — I25810 Atherosclerosis of coronary artery bypass graft(s) without angina pectoris: Secondary | ICD-10-CM | POA: Diagnosis not present

## 2018-07-11 DIAGNOSIS — I70263 Atherosclerosis of native arteries of extremities with gangrene, bilateral legs: Secondary | ICD-10-CM | POA: Diagnosis not present

## 2018-07-11 DIAGNOSIS — I350 Nonrheumatic aortic (valve) stenosis: Secondary | ICD-10-CM | POA: Diagnosis not present

## 2018-07-11 DIAGNOSIS — E039 Hypothyroidism, unspecified: Secondary | ICD-10-CM | POA: Diagnosis not present

## 2018-07-11 DIAGNOSIS — I509 Heart failure, unspecified: Secondary | ICD-10-CM | POA: Diagnosis not present

## 2018-07-11 DIAGNOSIS — G9009 Other idiopathic peripheral autonomic neuropathy: Secondary | ICD-10-CM | POA: Diagnosis not present

## 2018-07-11 DIAGNOSIS — G3184 Mild cognitive impairment, so stated: Secondary | ICD-10-CM | POA: Diagnosis not present

## 2018-07-11 DIAGNOSIS — R54 Age-related physical debility: Secondary | ICD-10-CM | POA: Diagnosis not present

## 2018-07-11 DIAGNOSIS — G20 Parkinson's disease: Secondary | ICD-10-CM | POA: Diagnosis not present

## 2018-07-13 DIAGNOSIS — G20 Parkinson's disease: Secondary | ICD-10-CM | POA: Diagnosis not present

## 2018-07-13 DIAGNOSIS — I350 Nonrheumatic aortic (valve) stenosis: Secondary | ICD-10-CM | POA: Diagnosis not present

## 2018-07-13 DIAGNOSIS — I70263 Atherosclerosis of native arteries of extremities with gangrene, bilateral legs: Secondary | ICD-10-CM | POA: Diagnosis not present

## 2018-07-13 DIAGNOSIS — I25810 Atherosclerosis of coronary artery bypass graft(s) without angina pectoris: Secondary | ICD-10-CM | POA: Diagnosis not present

## 2018-07-13 DIAGNOSIS — N183 Chronic kidney disease, stage 3 (moderate): Secondary | ICD-10-CM | POA: Diagnosis not present

## 2018-07-13 DIAGNOSIS — I509 Heart failure, unspecified: Secondary | ICD-10-CM | POA: Diagnosis not present

## 2018-07-14 DIAGNOSIS — I25810 Atherosclerosis of coronary artery bypass graft(s) without angina pectoris: Secondary | ICD-10-CM | POA: Diagnosis not present

## 2018-07-14 DIAGNOSIS — I509 Heart failure, unspecified: Secondary | ICD-10-CM | POA: Diagnosis not present

## 2018-07-14 DIAGNOSIS — I70263 Atherosclerosis of native arteries of extremities with gangrene, bilateral legs: Secondary | ICD-10-CM | POA: Diagnosis not present

## 2018-07-14 DIAGNOSIS — I350 Nonrheumatic aortic (valve) stenosis: Secondary | ICD-10-CM | POA: Diagnosis not present

## 2018-07-14 DIAGNOSIS — N183 Chronic kidney disease, stage 3 (moderate): Secondary | ICD-10-CM | POA: Diagnosis not present

## 2018-07-14 DIAGNOSIS — G20 Parkinson's disease: Secondary | ICD-10-CM | POA: Diagnosis not present

## 2018-07-15 DIAGNOSIS — I70263 Atherosclerosis of native arteries of extremities with gangrene, bilateral legs: Secondary | ICD-10-CM | POA: Diagnosis not present

## 2018-07-15 DIAGNOSIS — I509 Heart failure, unspecified: Secondary | ICD-10-CM | POA: Diagnosis not present

## 2018-07-15 DIAGNOSIS — G20 Parkinson's disease: Secondary | ICD-10-CM | POA: Diagnosis not present

## 2018-07-15 DIAGNOSIS — I350 Nonrheumatic aortic (valve) stenosis: Secondary | ICD-10-CM | POA: Diagnosis not present

## 2018-07-15 DIAGNOSIS — I25810 Atherosclerosis of coronary artery bypass graft(s) without angina pectoris: Secondary | ICD-10-CM | POA: Diagnosis not present

## 2018-07-15 DIAGNOSIS — N183 Chronic kidney disease, stage 3 (moderate): Secondary | ICD-10-CM | POA: Diagnosis not present

## 2018-07-16 DIAGNOSIS — G20 Parkinson's disease: Secondary | ICD-10-CM | POA: Diagnosis not present

## 2018-07-16 DIAGNOSIS — I25810 Atherosclerosis of coronary artery bypass graft(s) without angina pectoris: Secondary | ICD-10-CM | POA: Diagnosis not present

## 2018-07-16 DIAGNOSIS — I70263 Atherosclerosis of native arteries of extremities with gangrene, bilateral legs: Secondary | ICD-10-CM | POA: Diagnosis not present

## 2018-07-16 DIAGNOSIS — I350 Nonrheumatic aortic (valve) stenosis: Secondary | ICD-10-CM | POA: Diagnosis not present

## 2018-07-16 DIAGNOSIS — I509 Heart failure, unspecified: Secondary | ICD-10-CM | POA: Diagnosis not present

## 2018-07-16 DIAGNOSIS — N183 Chronic kidney disease, stage 3 (moderate): Secondary | ICD-10-CM | POA: Diagnosis not present

## 2018-07-17 DIAGNOSIS — G20 Parkinson's disease: Secondary | ICD-10-CM | POA: Diagnosis not present

## 2018-07-17 DIAGNOSIS — I509 Heart failure, unspecified: Secondary | ICD-10-CM | POA: Diagnosis not present

## 2018-07-17 DIAGNOSIS — I70263 Atherosclerosis of native arteries of extremities with gangrene, bilateral legs: Secondary | ICD-10-CM | POA: Diagnosis not present

## 2018-07-17 DIAGNOSIS — I350 Nonrheumatic aortic (valve) stenosis: Secondary | ICD-10-CM | POA: Diagnosis not present

## 2018-07-17 DIAGNOSIS — I25810 Atherosclerosis of coronary artery bypass graft(s) without angina pectoris: Secondary | ICD-10-CM | POA: Diagnosis not present

## 2018-07-17 DIAGNOSIS — N183 Chronic kidney disease, stage 3 (moderate): Secondary | ICD-10-CM | POA: Diagnosis not present

## 2018-07-18 DIAGNOSIS — I25810 Atherosclerosis of coronary artery bypass graft(s) without angina pectoris: Secondary | ICD-10-CM | POA: Diagnosis not present

## 2018-07-18 DIAGNOSIS — I509 Heart failure, unspecified: Secondary | ICD-10-CM | POA: Diagnosis not present

## 2018-07-18 DIAGNOSIS — I350 Nonrheumatic aortic (valve) stenosis: Secondary | ICD-10-CM | POA: Diagnosis not present

## 2018-07-18 DIAGNOSIS — I70263 Atherosclerosis of native arteries of extremities with gangrene, bilateral legs: Secondary | ICD-10-CM | POA: Diagnosis not present

## 2018-07-18 DIAGNOSIS — G20 Parkinson's disease: Secondary | ICD-10-CM | POA: Diagnosis not present

## 2018-07-18 DIAGNOSIS — N183 Chronic kidney disease, stage 3 (moderate): Secondary | ICD-10-CM | POA: Diagnosis not present

## 2018-07-19 DIAGNOSIS — N183 Chronic kidney disease, stage 3 (moderate): Secondary | ICD-10-CM | POA: Diagnosis not present

## 2018-07-19 DIAGNOSIS — I70263 Atherosclerosis of native arteries of extremities with gangrene, bilateral legs: Secondary | ICD-10-CM | POA: Diagnosis not present

## 2018-07-19 DIAGNOSIS — I25810 Atherosclerosis of coronary artery bypass graft(s) without angina pectoris: Secondary | ICD-10-CM | POA: Diagnosis not present

## 2018-07-19 DIAGNOSIS — I350 Nonrheumatic aortic (valve) stenosis: Secondary | ICD-10-CM | POA: Diagnosis not present

## 2018-07-19 DIAGNOSIS — G20 Parkinson's disease: Secondary | ICD-10-CM | POA: Diagnosis not present

## 2018-07-19 DIAGNOSIS — I509 Heart failure, unspecified: Secondary | ICD-10-CM | POA: Diagnosis not present

## 2018-07-20 DIAGNOSIS — I25810 Atherosclerosis of coronary artery bypass graft(s) without angina pectoris: Secondary | ICD-10-CM | POA: Diagnosis not present

## 2018-07-20 DIAGNOSIS — N183 Chronic kidney disease, stage 3 (moderate): Secondary | ICD-10-CM | POA: Diagnosis not present

## 2018-07-20 DIAGNOSIS — I509 Heart failure, unspecified: Secondary | ICD-10-CM | POA: Diagnosis not present

## 2018-07-20 DIAGNOSIS — G20 Parkinson's disease: Secondary | ICD-10-CM | POA: Diagnosis not present

## 2018-07-20 DIAGNOSIS — I70263 Atherosclerosis of native arteries of extremities with gangrene, bilateral legs: Secondary | ICD-10-CM | POA: Diagnosis not present

## 2018-07-20 DIAGNOSIS — I350 Nonrheumatic aortic (valve) stenosis: Secondary | ICD-10-CM | POA: Diagnosis not present

## 2018-08-10 DEATH — deceased

## 2019-04-08 ENCOUNTER — Other Ambulatory Visit: Payer: Self-pay
